# Patient Record
Sex: Female | Born: 1965 | Race: Black or African American | Hispanic: No | Marital: Single | Smoking: Former smoker
Health system: Southern US, Community
[De-identification: ages and names within clinical notes are randomized; demographics above are authoritative.]

## PROBLEM LIST (undated history)

## (undated) ENCOUNTER — Emergency Department (HOSPITAL_COMMUNITY): Admission: EM | Payer: Self-pay | Source: Home / Self Care

## (undated) DIAGNOSIS — I10 Essential (primary) hypertension: Secondary | ICD-10-CM

## (undated) DIAGNOSIS — Z4502 Encounter for adjustment and management of automatic implantable cardiac defibrillator: Secondary | ICD-10-CM

## (undated) DIAGNOSIS — K219 Gastro-esophageal reflux disease without esophagitis: Secondary | ICD-10-CM

## (undated) DIAGNOSIS — Z95810 Presence of automatic (implantable) cardiac defibrillator: Secondary | ICD-10-CM

## (undated) DIAGNOSIS — D869 Sarcoidosis, unspecified: Secondary | ICD-10-CM

## (undated) DIAGNOSIS — I509 Heart failure, unspecified: Secondary | ICD-10-CM

## (undated) DIAGNOSIS — E119 Type 2 diabetes mellitus without complications: Secondary | ICD-10-CM

## (undated) DIAGNOSIS — H269 Unspecified cataract: Secondary | ICD-10-CM

## (undated) DIAGNOSIS — F32A Depression, unspecified: Secondary | ICD-10-CM

## (undated) DIAGNOSIS — I428 Other cardiomyopathies: Secondary | ICD-10-CM

## (undated) DIAGNOSIS — F329 Major depressive disorder, single episode, unspecified: Secondary | ICD-10-CM

## (undated) HISTORY — DX: Unspecified cataract: H26.9

## (undated) HISTORY — PX: INSERTION OF ICD: SHX6689

## (undated) HISTORY — PX: PACEMAKER INSERTION: SHX728

## (undated) HISTORY — PX: HEART TRANSPLANT: SHX268

## (undated) HISTORY — PX: TUBAL LIGATION: SHX77

## (undated) HISTORY — PX: LAPAROSCOPIC GASTRIC SLEEVE RESECTION: SHX5895

## (undated) HISTORY — PX: CHOLECYSTECTOMY: SHX55

---
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
## 2015-01-13 DIAGNOSIS — Z95810 Presence of automatic (implantable) cardiac defibrillator: Secondary | ICD-10-CM

## 2015-01-13 HISTORY — DX: Presence of automatic (implantable) cardiac defibrillator: Z95.810

## 2015-08-13 ENCOUNTER — Emergency Department (HOSPITAL_BASED_OUTPATIENT_CLINIC_OR_DEPARTMENT_OTHER): Payer: Medicaid Other

## 2015-08-13 ENCOUNTER — Encounter (HOSPITAL_BASED_OUTPATIENT_CLINIC_OR_DEPARTMENT_OTHER): Payer: Self-pay

## 2015-08-13 ENCOUNTER — Emergency Department (HOSPITAL_BASED_OUTPATIENT_CLINIC_OR_DEPARTMENT_OTHER)
Admission: EM | Admit: 2015-08-13 | Discharge: 2015-08-13 | Disposition: A | Discharge status: Home / Self Care | Payer: Medicaid Other | Attending: Emergency Medicine | Admitting: Emergency Medicine

## 2015-08-13 DIAGNOSIS — Z9861 Coronary angioplasty status: Secondary | ICD-10-CM | POA: Insufficient documentation

## 2015-08-13 DIAGNOSIS — E119 Type 2 diabetes mellitus without complications: Secondary | ICD-10-CM | POA: Insufficient documentation

## 2015-08-13 DIAGNOSIS — S161XXA Strain of muscle, fascia and tendon at neck level, initial encounter: Secondary | ICD-10-CM | POA: Diagnosis not present

## 2015-08-13 DIAGNOSIS — I509 Heart failure, unspecified: Secondary | ICD-10-CM | POA: Diagnosis not present

## 2015-08-13 DIAGNOSIS — Y9389 Activity, other specified: Secondary | ICD-10-CM | POA: Diagnosis not present

## 2015-08-13 DIAGNOSIS — Z8659 Personal history of other mental and behavioral disorders: Secondary | ICD-10-CM | POA: Diagnosis not present

## 2015-08-13 DIAGNOSIS — Y92410 Unspecified street and highway as the place of occurrence of the external cause: Secondary | ICD-10-CM | POA: Insufficient documentation

## 2015-08-13 DIAGNOSIS — I10 Essential (primary) hypertension: Secondary | ICD-10-CM | POA: Diagnosis not present

## 2015-08-13 DIAGNOSIS — Z862 Personal history of diseases of the blood and blood-forming organs and certain disorders involving the immune mechanism: Secondary | ICD-10-CM | POA: Diagnosis not present

## 2015-08-13 DIAGNOSIS — S199XXA Unspecified injury of neck, initial encounter: Secondary | ICD-10-CM | POA: Diagnosis present

## 2015-08-13 DIAGNOSIS — S39012A Strain of muscle, fascia and tendon of lower back, initial encounter: Secondary | ICD-10-CM | POA: Insufficient documentation

## 2015-08-13 DIAGNOSIS — Y998 Other external cause status: Secondary | ICD-10-CM | POA: Diagnosis not present

## 2015-08-13 DIAGNOSIS — Z950 Presence of cardiac pacemaker: Secondary | ICD-10-CM | POA: Diagnosis not present

## 2015-08-13 HISTORY — DX: Type 2 diabetes mellitus without complications: E11.9

## 2015-08-13 HISTORY — DX: Sarcoidosis, unspecified: D86.9

## 2015-08-13 HISTORY — DX: Depression, unspecified: F32.A

## 2015-08-13 HISTORY — DX: Major depressive disorder, single episode, unspecified: F32.9

## 2015-08-13 HISTORY — DX: Essential (primary) hypertension: I10

## 2015-08-13 HISTORY — DX: Heart failure, unspecified: I50.9

## 2015-08-13 MED ORDER — NAPROXEN 500 MG PO TABS: 500.0000 mg | ORAL_TABLET | Freq: Two times a day (BID) | ORAL | Status: DC

## 2015-08-13 MED ORDER — HYDROCODONE-ACETAMINOPHEN 5-325 MG PO TABS: 1.0000 | ORAL_TABLET | Freq: Four times a day (QID) | ORAL | Status: DC | PRN
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
## 2015-08-13 NOTE — ED Notes (Signed)
MD at bedside. 
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
## 2015-09-02 DIAGNOSIS — E785 Hyperlipidemia, unspecified: Secondary | ICD-10-CM | POA: Insufficient documentation

## 2015-09-02 DIAGNOSIS — I471 Supraventricular tachycardia: Secondary | ICD-10-CM | POA: Insufficient documentation

## 2015-09-02 DIAGNOSIS — E039 Hypothyroidism, unspecified: Secondary | ICD-10-CM | POA: Insufficient documentation

## 2015-09-02 DIAGNOSIS — I5020 Unspecified systolic (congestive) heart failure: Secondary | ICD-10-CM | POA: Insufficient documentation

## 2016-02-08 ENCOUNTER — Encounter (HOSPITAL_BASED_OUTPATIENT_CLINIC_OR_DEPARTMENT_OTHER): Payer: Self-pay | Admitting: Emergency Medicine

## 2016-02-08 ENCOUNTER — Emergency Department (HOSPITAL_BASED_OUTPATIENT_CLINIC_OR_DEPARTMENT_OTHER): Payer: Medicaid Other

## 2016-02-08 ENCOUNTER — Observation Stay (HOSPITAL_BASED_OUTPATIENT_CLINIC_OR_DEPARTMENT_OTHER)
Admission: EM | Admit: 2016-02-08 | Discharge: 2016-02-09 | Disposition: A | Discharge status: Home / Self Care | Payer: Medicaid Other | Attending: Cardiology | Admitting: Cardiology

## 2016-02-08 ENCOUNTER — Encounter (HOSPITAL_COMMUNITY)
Admission: EM | Disposition: A | Discharge status: Home / Self Care | Payer: Self-pay | Source: Home / Self Care | Attending: Cardiology

## 2016-02-08 DIAGNOSIS — Z79899 Other long term (current) drug therapy: Secondary | ICD-10-CM | POA: Diagnosis not present

## 2016-02-08 DIAGNOSIS — I428 Other cardiomyopathies: Secondary | ICD-10-CM | POA: Diagnosis present

## 2016-02-08 DIAGNOSIS — Z7982 Long term (current) use of aspirin: Secondary | ICD-10-CM | POA: Diagnosis not present

## 2016-02-08 DIAGNOSIS — I5022 Chronic systolic (congestive) heart failure: Secondary | ICD-10-CM | POA: Diagnosis present

## 2016-02-08 DIAGNOSIS — R079 Chest pain, unspecified: Secondary | ICD-10-CM | POA: Diagnosis present

## 2016-02-08 DIAGNOSIS — F329 Major depressive disorder, single episode, unspecified: Secondary | ICD-10-CM | POA: Insufficient documentation

## 2016-02-08 DIAGNOSIS — I110 Hypertensive heart disease with heart failure: Secondary | ICD-10-CM | POA: Diagnosis not present

## 2016-02-08 DIAGNOSIS — I10 Essential (primary) hypertension: Secondary | ICD-10-CM | POA: Diagnosis not present

## 2016-02-08 DIAGNOSIS — I200 Unstable angina: Secondary | ICD-10-CM | POA: Diagnosis not present

## 2016-02-08 DIAGNOSIS — Z794 Long term (current) use of insulin: Secondary | ICD-10-CM | POA: Insufficient documentation

## 2016-02-08 DIAGNOSIS — I420 Dilated cardiomyopathy: Secondary | ICD-10-CM

## 2016-02-08 DIAGNOSIS — Z791 Long term (current) use of non-steroidal anti-inflammatories (NSAID): Secondary | ICD-10-CM | POA: Insufficient documentation

## 2016-02-08 DIAGNOSIS — Z95810 Presence of automatic (implantable) cardiac defibrillator: Secondary | ICD-10-CM | POA: Insufficient documentation

## 2016-02-08 DIAGNOSIS — Z87891 Personal history of nicotine dependence: Secondary | ICD-10-CM | POA: Insufficient documentation

## 2016-02-08 DIAGNOSIS — D869 Sarcoidosis, unspecified: Secondary | ICD-10-CM | POA: Diagnosis not present

## 2016-02-08 DIAGNOSIS — E119 Type 2 diabetes mellitus without complications: Secondary | ICD-10-CM | POA: Diagnosis not present

## 2016-02-08 DIAGNOSIS — R0789 Other chest pain: Principal | ICD-10-CM | POA: Insufficient documentation

## 2016-02-08 HISTORY — DX: Sarcoidosis, unspecified: D86.9

## 2016-02-08 HISTORY — DX: Other cardiomyopathies: I42.8

## 2016-02-08 HISTORY — PX: CARDIAC CATHETERIZATION: SHX172

## 2016-02-08 LAB — BRAIN NATRIURETIC PEPTIDE: B Natriuretic Peptide: 102.9 pg/mL — ABNORMAL HIGH (ref 0.0–100.0)

## 2016-02-08 LAB — GLUCOSE, CAPILLARY
Glucose-Capillary: 139 mg/dL — ABNORMAL HIGH (ref 65–99)
Glucose-Capillary: 120 mg/dL — ABNORMAL HIGH (ref 65–99)
Glucose-Capillary: 103 mg/dL — ABNORMAL HIGH (ref 65–99)
Glucose-Capillary: 215 mg/dL — ABNORMAL HIGH (ref 65–99)

## 2016-02-08 LAB — CBC WITH DIFFERENTIAL/PLATELET
Basophils Absolute: 0 10*3/uL (ref 0.0–0.1)
Basophils Relative: 0 %
Eosinophils Absolute: 0.1 10*3/uL (ref 0.0–0.7)
Eosinophils Relative: 1 %
HCT: 38.7 % (ref 36.0–46.0)
Hemoglobin: 12.7 g/dL (ref 12.0–15.0)
Lymphocytes Relative: 22 %
Lymphs Abs: 2.2 10*3/uL (ref 0.7–4.0)
MCH: 28 pg (ref 26.0–34.0)
MCHC: 32.8 g/dL (ref 30.0–36.0)
MCV: 85.4 fL (ref 78.0–100.0)
Monocytes Absolute: 1 10*3/uL (ref 0.1–1.0)
Monocytes Relative: 10 %
Neutro Abs: 6.8 10*3/uL (ref 1.7–7.7)
Neutrophils Relative %: 67 %
Platelets: 200 10*3/uL (ref 150–400)
RBC: 4.53 MIL/uL (ref 3.87–5.11)
RDW: 13.1 % (ref 11.5–15.5)
WBC: 10 10*3/uL (ref 4.0–10.5)

## 2016-02-08 LAB — BASIC METABOLIC PANEL
Anion gap: 7 (ref 5–15)
BUN: 9 mg/dL (ref 6–20)
CO2: 26 mmol/L (ref 22–32)
Calcium: 9 mg/dL (ref 8.9–10.3)
Chloride: 102 mmol/L (ref 101–111)
Creatinine, Ser: 0.64 mg/dL (ref 0.44–1.00)
GFR calc Af Amer: >60 mL/min (ref >60)
GFR calc non Af Amer: >60 mL/min (ref >60)
Glucose, Bld: 182 mg/dL — ABNORMAL HIGH (ref 65–99)
Potassium: 3.9 mmol/L (ref 3.5–5.1)
Sodium: 135 mmol/L (ref 135–145)

## 2016-02-08 LAB — TROPONIN I
Troponin I: <0.03 ng/mL (ref <0.031)
Troponin I: <0.03 ng/mL (ref <0.031)
Troponin I: <0.03 ng/mL (ref <0.031)
Troponin I: <0.03 ng/mL (ref <0.031)

## 2016-02-08 LAB — MRSA PCR SCREENING: MRSA by PCR: POSITIVE — AB

## 2016-02-08 LAB — PROTIME-INR
INR: 1.12 (ref 0.00–1.49)
Prothrombin Time: 14.6 seconds (ref 11.6–15.2)

## 2016-02-08 LAB — HEPARIN LEVEL (UNFRACTIONATED): Heparin Unfractionated: 0.6 IU/mL (ref 0.30–0.70)

## 2016-02-08 SURGERY — LEFT HEART CATH AND CORONARY ANGIOGRAPHY

## 2016-02-08 MED ORDER — SODIUM CHLORIDE 0.9 % IV SOLN: 250.0000 mL | INTRAVENOUS | Status: DC | PRN

## 2016-02-08 MED ORDER — ASPIRIN EC 81 MG PO TBEC: 81.0000 mg | DELAYED_RELEASE_TABLET | Freq: Every day | ORAL | Status: DC

## 2016-02-08 MED ORDER — TRAZODONE HCL 50 MG PO TABS
150.0000 mg | ORAL_TABLET | Freq: Every day | ORAL | Status: DC
  Administered 2016-02-08: 150 mg via ORAL
  Filled 2016-02-08: qty 1

## 2016-02-08 MED ORDER — NITROGLYCERIN 1 MG/10 ML FOR IR/CATH LAB
INTRA_ARTERIAL | Status: AC
  Filled 2016-02-08: qty 10

## 2016-02-08 MED ORDER — IOPAMIDOL (ISOVUE-370) INJECTION 76%
INTRAVENOUS | Status: DC | PRN
  Administered 2016-02-08: 40 mL via INTRA_ARTERIAL

## 2016-02-08 MED ORDER — CYCLOBENZAPRINE HCL 10 MG PO TABS: 10.0000 mg | ORAL_TABLET | Freq: Two times a day (BID) | ORAL | Status: DC | PRN

## 2016-02-08 MED ORDER — HEPARIN BOLUS VIA INFUSION
4000.0000 [IU] | Freq: Once | INTRAVENOUS | Status: AC
  Administered 2016-02-08: 4000 [IU] via INTRAVENOUS

## 2016-02-08 MED ORDER — ACETAMINOPHEN 325 MG PO TABS: 650.0000 mg | ORAL_TABLET | ORAL | Status: DC | PRN

## 2016-02-08 MED ORDER — HEPARIN SODIUM (PORCINE) 1000 UNIT/ML IJ SOLN
INTRAMUSCULAR | Status: AC
  Filled 2016-02-08: qty 1

## 2016-02-08 MED ORDER — SPIRONOLACTONE 25 MG PO TABS
25.0000 mg | ORAL_TABLET | Freq: Every day | ORAL | Status: DC
  Administered 2016-02-09: 25 mg via ORAL
  Filled 2016-02-08 (×2): qty 1

## 2016-02-08 MED ORDER — ONDANSETRON HCL 4 MG/2ML IJ SOLN: 4.0000 mg | Freq: Four times a day (QID) | INTRAMUSCULAR | Status: DC | PRN

## 2016-02-08 MED ORDER — IOPAMIDOL (ISOVUE-370) INJECTION 76%
INTRAVENOUS | Status: AC
  Filled 2016-02-08: qty 100

## 2016-02-08 MED ORDER — INSULIN GLARGINE 100 UNIT/ML ~~LOC~~ SOLN
28.0000 [IU] | Freq: Every day | SUBCUTANEOUS | Status: DC
  Administered 2016-02-08: 28 [IU] via SUBCUTANEOUS
  Filled 2016-02-08 (×2): qty 0.28

## 2016-02-08 MED ORDER — NITROGLYCERIN IN D5W 200-5 MCG/ML-% IV SOLN
5.0000 ug/min | INTRAVENOUS | Status: DC
  Administered 2016-02-08: 5 ug/min via INTRAVENOUS
  Filled 2016-02-08: qty 250

## 2016-02-08 MED ORDER — SODIUM CHLORIDE 0.9% FLUSH
3.0000 mL | Freq: Two times a day (BID) | INTRAVENOUS | Status: DC
  Administered 2016-02-08 – 2016-02-09 (×2): 3 mL via INTRAVENOUS

## 2016-02-08 MED ORDER — HEPARIN (PORCINE) IN NACL 2-0.9 UNIT/ML-% IJ SOLN
INTRAMUSCULAR | Status: AC
  Filled 2016-02-08: qty 500

## 2016-02-08 MED ORDER — ACETAMINOPHEN 325 MG PO TABS
650.0000 mg | ORAL_TABLET | ORAL | Status: DC | PRN
  Administered 2016-02-08: 650 mg via ORAL
  Filled 2016-02-08: qty 2

## 2016-02-08 MED ORDER — CARVEDILOL 6.25 MG PO TABS
6.2500 mg | ORAL_TABLET | Freq: Two times a day (BID) | ORAL | Status: DC
  Administered 2016-02-08 – 2016-02-09 (×3): 6.25 mg via ORAL
  Filled 2016-02-08 (×3): qty 1

## 2016-02-08 MED ORDER — INSULIN ASPART 100 UNIT/ML ~~LOC~~ SOLN
0.0000 [IU] | Freq: Three times a day (TID) | SUBCUTANEOUS | Status: DC
  Administered 2016-02-09: 3 [IU] via SUBCUTANEOUS

## 2016-02-08 MED ORDER — FENTANYL CITRATE (PF) 100 MCG/2ML IJ SOLN
INTRAMUSCULAR | Status: AC
  Filled 2016-02-08: qty 2

## 2016-02-08 MED ORDER — MIDAZOLAM HCL 2 MG/2ML IJ SOLN
INTRAMUSCULAR | Status: AC
  Filled 2016-02-08: qty 2

## 2016-02-08 MED ORDER — SODIUM CHLORIDE 0.9% FLUSH
3.0000 mL | Freq: Two times a day (BID) | INTRAVENOUS | Status: DC
  Administered 2016-02-09: 3 mL via INTRAVENOUS

## 2016-02-08 MED ORDER — FENTANYL CITRATE (PF) 100 MCG/2ML IJ SOLN
INTRAMUSCULAR | Status: DC | PRN
  Administered 2016-02-08 (×2): 25 ug via INTRAVENOUS

## 2016-02-08 MED ORDER — ASPIRIN 81 MG PO CHEW: 81.0000 mg | CHEWABLE_TABLET | ORAL | Status: DC

## 2016-02-08 MED ORDER — ASPIRIN 81 MG PO CHEW
324.0000 mg | CHEWABLE_TABLET | Freq: Once | ORAL | Status: AC
  Administered 2016-02-08: 324 mg via ORAL
  Filled 2016-02-08: qty 4

## 2016-02-08 MED ORDER — MUPIROCIN 2 % EX OINT
1.0000 "application " | TOPICAL_OINTMENT | Freq: Two times a day (BID) | CUTANEOUS | Status: DC
  Administered 2016-02-08 – 2016-02-09 (×3): 1 via NASAL
  Filled 2016-02-08 (×2): qty 22

## 2016-02-08 MED ORDER — ACETAMINOPHEN 325 MG PO TABS
650.0000 mg | ORAL_TABLET | Freq: Once | ORAL | Status: AC
  Administered 2016-02-08: 650 mg via ORAL

## 2016-02-08 MED ORDER — FLUTICASONE PROPIONATE 50 MCG/ACT NA SUSP
1.0000 | Freq: Every day | NASAL | Status: DC
  Filled 2016-02-08 (×2): qty 16

## 2016-02-08 MED ORDER — FAMOTIDINE 20 MG PO TABS
20.0000 mg | ORAL_TABLET | Freq: Every day | ORAL | Status: DC
  Administered 2016-02-08 – 2016-02-09 (×2): 20 mg via ORAL
  Filled 2016-02-08 (×2): qty 1

## 2016-02-08 MED ORDER — SODIUM CHLORIDE 0.9% FLUSH: 3.0000 mL | INTRAVENOUS | Status: DC | PRN

## 2016-02-08 MED ORDER — MIDAZOLAM HCL 2 MG/2ML IJ SOLN
INTRAMUSCULAR | Status: DC | PRN
  Administered 2016-02-08: 2 mg via INTRAVENOUS

## 2016-02-08 MED ORDER — FLUOXETINE HCL 20 MG PO CAPS
40.0000 mg | ORAL_CAPSULE | Freq: Every day | ORAL | Status: DC
  Administered 2016-02-08 – 2016-02-09 (×2): 40 mg via ORAL
  Filled 2016-02-08 (×2): qty 2

## 2016-02-08 MED ORDER — CHLORHEXIDINE GLUCONATE CLOTH 2 % EX PADS
6.0000 | MEDICATED_PAD | Freq: Every day | CUTANEOUS | Status: DC
  Administered 2016-02-09: 6 via TOPICAL

## 2016-02-08 MED ORDER — ACETAMINOPHEN 325 MG PO TABS
ORAL_TABLET | ORAL | Status: AC
  Administered 2016-02-08: 650 mg via ORAL
  Filled 2016-02-08: qty 2

## 2016-02-08 MED ORDER — METOPROLOL TARTRATE 25 MG PO TABS
25.0000 mg | ORAL_TABLET | Freq: Every day | ORAL | Status: DC
Start: 2016-02-09 — End: 2016-02-08

## 2016-02-08 MED ORDER — METOPROLOL TARTRATE 25 MG PO TABS: 25.0000 mg | ORAL_TABLET | Freq: Every day | ORAL | Status: DC

## 2016-02-08 MED ORDER — METOPROLOL TARTRATE 12.5 MG HALF TABLET
12.5000 mg | ORAL_TABLET | Freq: Once | ORAL | Status: AC
  Administered 2016-02-08: 12.5 mg via ORAL
  Filled 2016-02-08: qty 1

## 2016-02-08 MED ORDER — SODIUM CHLORIDE 0.9 % WEIGHT BASED INFUSION
3.0000 mL/kg/h | INTRAVENOUS | Status: DC
  Administered 2016-02-08: 3 mL/kg/h via INTRAVENOUS

## 2016-02-08 MED ORDER — HEPARIN (PORCINE) IN NACL 2-0.9 UNIT/ML-% IJ SOLN
INTRAMUSCULAR | Status: DC | PRN
  Administered 2016-02-08: 10 mL via INTRA_ARTERIAL

## 2016-02-08 MED ORDER — VERAPAMIL HCL 2.5 MG/ML IV SOLN
INTRAVENOUS | Status: AC
  Filled 2016-02-08: qty 2

## 2016-02-08 MED ORDER — HEPARIN (PORCINE) IN NACL 100-0.45 UNIT/ML-% IJ SOLN
1250.0000 [IU]/h | INTRAMUSCULAR | Status: DC
  Administered 2016-02-08: 1250 [IU]/h via INTRAVENOUS
  Filled 2016-02-08: qty 250

## 2016-02-08 MED ORDER — NITROGLYCERIN 0.4 MG SL SUBL
0.4000 mg | SUBLINGUAL_TABLET | SUBLINGUAL | Status: DC | PRN
Start: 2016-02-08 — End: 2016-02-09
  Administered 2016-02-08: 0.4 mg via SUBLINGUAL
  Filled 2016-02-08: qty 1

## 2016-02-08 MED ORDER — HEPARIN (PORCINE) IN NACL 2-0.9 UNIT/ML-% IJ SOLN
INTRAMUSCULAR | Status: DC | PRN
  Administered 2016-02-08: 19:00:00

## 2016-02-08 MED ORDER — HEPARIN SODIUM (PORCINE) 1000 UNIT/ML IJ SOLN
INTRAMUSCULAR | Status: DC | PRN
  Administered 2016-02-08: 6000 [IU] via INTRAVENOUS

## 2016-02-08 MED ORDER — LIDOCAINE HCL (PF) 1 % IJ SOLN
INTRAMUSCULAR | Status: DC | PRN
  Administered 2016-02-08: 2 mL via INTRADERMAL

## 2016-02-08 MED ORDER — SODIUM CHLORIDE 0.9 % WEIGHT BASED INFUSION
1.0000 mL/kg/h | INTRAVENOUS | Status: DC
  Administered 2016-02-08: 1 mL/kg/h via INTRAVENOUS

## 2016-02-08 MED ORDER — SODIUM CHLORIDE 0.9% FLUSH: 3.0000 mL | Freq: Two times a day (BID) | INTRAVENOUS | Status: DC

## 2016-02-08 MED ORDER — METOPROLOL TARTRATE 12.5 MG HALF TABLET
12.5000 mg | ORAL_TABLET | Freq: Two times a day (BID) | ORAL | Status: DC
  Administered 2016-02-08: 12.5 mg via ORAL
  Filled 2016-02-08: qty 1

## 2016-02-08 MED ORDER — FUROSEMIDE 40 MG PO TABS
40.0000 mg | ORAL_TABLET | Freq: Every day | ORAL | Status: DC
  Administered 2016-02-09: 40 mg via ORAL
  Filled 2016-02-08 (×2): qty 1

## 2016-02-08 MED ORDER — LIDOCAINE HCL (PF) 1 % IJ SOLN
INTRAMUSCULAR | Status: AC
Start: 2016-02-08 — End: 2016-02-08
  Filled 2016-02-08: qty 30

## 2016-02-08 MED ORDER — HYDROXYCHLOROQUINE SULFATE 200 MG PO TABS
400.0000 mg | ORAL_TABLET | Freq: Every day | ORAL | Status: DC
  Administered 2016-02-08 – 2016-02-09 (×2): 400 mg via ORAL
  Filled 2016-02-08 (×2): qty 2

## 2016-02-08 SURGICAL SUPPLY — 9 items
CATH INFINITI 5 FR JL3.5 (CATHETERS) ×2 IMPLANT
CATH INFINITI JR4 5F (CATHETERS) ×2 IMPLANT
DEVICE RAD COMP TR BAND LRG (VASCULAR PRODUCTS) ×2 IMPLANT
GLIDESHEATH SLEND SS 6F .021 (SHEATH) ×2 IMPLANT
KIT HEART LEFT (KITS) ×2 IMPLANT
PACK CARDIAC CATHETERIZATION (CUSTOM PROCEDURE TRAY) ×2 IMPLANT
TRANSDUCER W/STOPCOCK (MISCELLANEOUS) ×2 IMPLANT
TUBING CIL FLEX 10 FLL-RA (TUBING) ×2 IMPLANT
WIRE SAFE-T 1.5MM-J .035X260CM (WIRE) ×2 IMPLANT

## 2016-02-08 NOTE — Progress Notes (Signed)
ANTICOAGULATION CONSULT NOTE - Initial Consult  Pharmacy Consult for Heparin Indication: chest pain/ACS  No Known Allergies  Patient Measurements: Height: 5\' 6"  (167.6 cm) Weight: 283 lb 3.2 oz (128.459 kg) IBW/kg (Calculated) : 59.3 Heparin Dosing Weight: 89 kg  Vital Signs: Temp: 97.9 F (36.6 C) (04/11 0207) Temp Source: Oral (04/11 0207) BP: 117/74 mmHg (04/11 0400) Pulse Rate: 87 (04/11 0400)  Labs:  Recent Labs  02/08/16 0345  HGB 12.7  HCT 38.7  PLT 200  CREATININE 0.64  TROPONINI <0.03    Estimated Creatinine Clearance: 116.8 mL/min (by C-G formula based on Cr of 0.64).   Medical History: Past Medical History  Diagnosis Date  . CHF (congestive heart failure) (HCC)   . Depression   . Sarcoidosis (HCC)   . Diabetes mellitus without complication (HCC)   . Hypertension     Medications:  See electronic med rec  Assessment: 50 y.o. F presents with L arm pain x 3 weeks. To begin heparin for r/o ACS. CBC ok on admission. No AC PTA.  Goal of Therapy:  Heparin level 0.3-0.7 units/ml Monitor platelets by anticoagulation protocol: Yes   Plan:  Heparin IV bolus 4000 units Heparin gtt at 1250 units/hr Will f/u heparin level in hours Daily heparin level and CBC  Christoper Fabian, PharmD, BCPS Clinical pharmacist, pager [phone] 02/08/2016,4:26 AM

## 2016-02-08 NOTE — ED Notes (Signed)
MD at bedside. Discussing plan for admission.

## 2016-02-08 NOTE — ED Notes (Signed)
Nitro received after patient ambulated to the bathroom. Pt came back stating "Its starting again" referring to the left side arm pain.

## 2016-02-08 NOTE — ED Notes (Signed)
Pt c/o persistent arm pain x3 weeks. Left arm has mild swelling. Pt states she was seen at [facility] last night where they did a full work up and did not find anything. States "I took 2 advil, maybe I have should have taken more but I wasn't sure." A/O full ROM.

## 2016-02-08 NOTE — H&P (Signed)
  History & Physical    Patient ID: Alicia Cook MRN: [id number], DOB/AGE: 01/29/1966   Admit date: 02/08/2016  Primary Physician: COUSINS, Latonya A, FNP Primary Cardiologist: [facility]   History of Present Illness    Alicia Cook is a 49 y.o. female with past medical history of nonischemic cardiomyopathy (reported EF of 25%, s/p ICD placement 2016), HTN, Type 2 DM, tobacco abuse, and Sarcoidosis who presented to [facility] on 02/08/2016 for evaluation of left arm pain radiating into her chest.  The patient is overall a poor historian and says she "takes a medication that harms her memory". She is aware of having a cardiac catheterization in the past but unsure when. While at [facility] they were able to obtain her records from [facility] which showed she had a catheterization in 2011 and showed angiographically normal coronary arteries. 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 EF by echocardiogram 35-40%. PAP = 24 mm Hg.   The patient reports she had an ICD placed in 12/2014 and she believes her EF was "20%" at that time and says "it was placed because of my sarcoidosis". She denies having a repeat cath in recent years, saying she only ever had one in her entire life.  She recently moved from [place] and has been establishing care in the [place] area.  She was seen by a Cardiologist at [facility] in 08/2015 and asymptomatic at that time. Over the past few months, she has developed a pressure going down her left arm and radiating into the left side of her chest. It is associated with dyspnea and nausea and occurs with exertional acticity. She has been evaluated over 3 times in the ED for the pain and was given Morphine and had resolution of her symptoms. She was subseuently discharged and told to follow-up with her Cardiologist but she was unable to be fit in for an appointment.   She did have a stress test performed on 02/01/2016 which showed no scintigraphic  evidence of prior infarct or inducible ischemia. Global hypokinesia with akinesia involving the inferior and lateral walls of the left ventricle were noted and an EF of 25% was reported at that time. Overall, the test was high-risk.  Over the past several days, the pain has increased in intensity and frequency, occurring 3- 4 times a day. Develops with walking around her home and relieved with rest. The pain typically lasts 20-30 minutes.  Since presenting to [facility], her labs showed a WBC of 10.0. Hgb 12.7. Platelets 200. Electrolytes WNL. Creatinine 0.64. Troponin negative. BNP 102. EKG shows NSR, HR 88, with nonspecific intraventricular conduction delay. She was started on Heparin along with a NTG drip and reports her pain has now resolved.   Past Medical History    Past Medical History  Diagnosis Date  . CHF (congestive heart failure) (HCC)   . Depression   . Sarcoidosis (HCC)   . Diabetes mellitus without complication (HCC)   . Hypertension   . Nonischemic cardiomyopathy (HCC)     a. EF 35-40% by cath in 2011 with normal cors b. s/p ICD implantation in 12/2014 c. EF 25% by NST in 02/01/2016    Past Surgical History  Procedure Laterality Date  . Cesarean section    . Pacemaker placement    . Abdominal surgery    . Cholecystectomy    . Tubal ligation       Allergies: No Known Allergies   Home Medications    Prior   to Admission medications   Medication Sig Start Date End Date Taking? Authorizing Provider  ibuprofen (ADVIL,MOTRIN) 100 MG tablet Take 200 mg by mouth every 6 (six) hours as needed for pain.   Yes Historical Provider, MD  HYDROcodone-acetaminophen (NORCO/VICODIN) 5-325 MG tablet Take 1-2 tablets by mouth every 6 (six) hours as needed for moderate pain. 08/13/15   Scott Zackowski, MD  naproxen (NAPROSYN) 500 MG tablet Take 1 tablet (500 mg total) by mouth 2 (two) times daily. 08/13/15   Scott Zackowski, MD    Family History    Family History  Problem Relation Age  of Onset  . Heart attack Father     a. Initial onset in his 50's. S/p CABG  . Heart failure Father     Social History    Social History   Social History  . Marital Status: Single    Spouse Name: N/A  . Number of Children: N/A  . Years of Education: N/A   Occupational History  . Not on file.   Social History Main Topics  . Smoking status: Former Smoker -- 1.00 packs/day for 20 years    Types: Cigarettes  . Smokeless tobacco: Not on file  . Alcohol Use: No  . Drug Use: No  . Sexual Activity: Not on file   Other Topics Concern  . Not on file   Social History Narrative     Review of Systems    General:  No chills, fever, night sweats or weight changes.  Cardiovascular:  No edema, orthopnea, palpitations, paroxysmal nocturnal dyspnea. Positive for chest pain and dyspnea on exertion. Dermatological: No rash, lesions/masses Respiratory: No cough, Positive for dyspnea. Urologic: No hematuria, dysuria Abdominal:   No vomiting, diarrhea, bright red blood per rectum, melena, or hematemesis. Positive for nausea. Neurologic:  No visual changes, wkns, changes in mental status. All other systems reviewed and are otherwise negative except as noted above.  Physical Exam    Blood pressure 139/89, pulse 81, temperature 98 F (36.7 C), temperature source Oral, resp. rate 18, height 5' 7" (1.702 m), weight 283 lb 1.6 oz (128.413 kg), last menstrual period 10/31/2015, SpO2 100 %.  General: Well developed, well nourished,female in no acute distress. Head: Normocephalic, atraumatic, sclera non-icteric, no xanthomas, nares are without discharge.  Neck: No carotid bruits. JVD not elevated.  Lungs: Respirations regular and unlabored, without wheezes or rales.  Heart: Regular rate and rhythm. No S3 or S4.  No murmur, no rubs, or gallops appreciated. Abdomen: Soft, non-tender, non-distended with normoactive bowel sounds. No hepatomegaly. No rebound/guarding. No obvious abdominal masses. Msk:   Strength and tone appear normal for age. No joint deformities or effusions. Extremities: No clubbing or cyanosis. No edema.  Distal pedal pulses are 2+ bilaterally. Neuro: Alert and oriented X 3. Moves all extremities spontaneously. No focal deficits noted. Psych:  Responds to questions appropriately with a normal affect. Skin: No rashes or lesions noted  Labs    Troponin (Point of Care Test) No results for input(s): TROPIPOC in the last 72 hours.  Recent Labs  02/08/16 0345  TROPONINI <0.03   Lab Results  Component Value Date   WBC 10.0 02/08/2016   HGB 12.7 02/08/2016   HCT 38.7 02/08/2016   MCV 85.4 02/08/2016   PLT 200 02/08/2016     Recent Labs Lab 02/08/16 0345  NA 135  K 3.9  CL 102  CO2 26  BUN 9  CREATININE 0.64  CALCIUM 9.0  GLUCOSE 182*   No results   found for: CHOL, HDL, LDLCALC, TRIG No results found for: DDIMER   B NATRIURETIC PEPTIDE  Date/Time Value Ref Range Status  02/08/2016 03:45 AM 102.9* 0.0 - 100.0 pg/mL Final    EKG & Cardiac Imaging    EKG: NSR, HR 88. Nonspecific intraventricular conduction delay.  Nuclear Stress Test: 02/01/2016 IMPRESSION: 1. No definite scintigraphic evidence of prior infarction pharmacologically induced ischemia.  2. Global hypokinesia with relative akinesia involving the inferior and lateral walls of the left ventricle..  3. Left ventricular ejection fraction 25%  4. High-risk stress test findings*.  *2012 Appropriate Use Criteria for Coronary Revascularization Focused Update: J Am Coll Cardiol. 2012;59(9):857-881. http://content.onlinejacc.org/article.aspx?articleid=1201161  Assessment & Plan    1. Chest pain - reports having pressure down her left arm which radiates into the left-side of her chest. Presents with exertion and associated symptoms include dyspnea and nausea. Has been progressing in severity for the past few months. - last cardiac cath was in 2011 and showed normal coronary arteries with  an EF of 35-40%.Pateint reports her EF was 20% in 2016 at time of ICD placement but a repeat cath was not performed. - NST on 02/01/2016 showed no scintigraphic evidence of prior infarct or inducible ischemia but global hypokinesia with akinesia involving the inferior and lateral walls of the left ventricle were noted and an EF of 25% was reported at that time.  - Initial troponin is negative. EKG shows NSR, HR 88, with nonspecific intraventricular conduction delay. Reports resolution of her pain after being started on IV NTG and Heparin. Lipid Panel pending.  - difficult situation as her symptoms seem consistent with unstable angina, yet she had a normal cath in 2011 and no ischemia noted on her recent stress test. Would recommend proceeding with a cardiac catheterization for definitive diagnosis, for her EF has dropped since 2011 and she now has symptoms concerning for unstable angina. The risks and benefits of the procedure were discussed with the patient and she agrees to proceed.  2. 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 EF noted to be 35-40% by cath in 2011, per patient's report was 20% at time of ICD implantation. - repeat echocardiogram pending. - continue PTA Lasix, Spironolactone, and BB. Was on Cardizem CD 240mg daily PTA--> will hold with new reduced EF of 25% as reported by NST last week.   3. HTN - currently well-controlled. - continue medication regimen.  4. Type 2 DM - Metformin held in regards to possible cath. - continue home Lantus and SSI.  5. Sarcoidosis - continue PTA Plaquenil.    Signed, Jeorgia Helming M Zyiah Withington, PA-C 02/08/2016, 9:29 AM Pager: [phone]   

## 2016-02-08 NOTE — ED Provider Notes (Addendum)
TIME SEEN: 3:00 AM  CHIEF COMPLAINT: Chest pain, left arm pain, shortness of breath  HPI: Pt is a 50 y.o. female with history of sarcoidosis, hypertension, diabetes, cardiomyopathy with an EF of 25% status post a fibular/pacemaker placement 2016 in [place] who presents to the emergency department with worsening episodes of left arm pain. States that she will have episodes of left arm pain that she describes as a throbbing discomfort and then pain will continue into her left anterior chest and upper left jaw. States that pain used to," once in a blue moon" over the past 3-4 weeks but is now more frequent and is more severe. She states the pain now last 30-45 minutes. She does not notice any aggravating or alleviating factors. She does have associated shortness of breath and sudden onset nausea with this but no dizziness or diaphoresis. She still having some pain currently but had has resolved. She states she tried ibuprofen at home without relief. No injury to the chest or arm. No numbness, tingling. Was seen at [facility] on April 4 and was admitted. At that time she had a stress test (see below) that was deemed high risk but showed no obvious reversible ischemic abnormality. She did have significant hypokinesia.  Patient returned to the emergency department at [facility] on April 10 and had 2 negative troponins. She had a clear chest x-ray and a CT angiography of her chest which showed no pulmonary embolus. There was minimal bibasilar atelectasis noted. She had a 1.2 cm right paratracheal node that was nonspecific.  States her last cardiac catheterization was in 2010 at [facility] in [place].  PCP is Dr. Cherly Hensen Cardiologist is with [facility]   NM Test 02/01/16:   IMPRESSION: 1. No definite scintigraphic evidence of prior infarction pharmacologically induced ischemia.  2. Global hypokinesia with relative akinesia involving the  inferior and lateral walls of the left ventricle..  3. Left ventricular ejection fraction 25%  4. High-risk stress test findings*.  *2012 Appropriate Use Criteria for Coronary Revascularization     ROS: See HPI Constitutional: no fever  Eyes: no drainage  ENT: no runny nose   Cardiovascular:   chest pain  Resp:  SOB  GI: no vomiting GU: no dysuria Integumentary: no rash  Allergy: no hives  Musculoskeletal: no leg swelling  Neurological: no slurred speech ROS otherwise negative  PAST MEDICAL HISTORY/PAST SURGICAL HISTORY:  Past Medical History  Diagnosis Date  . CHF (congestive heart failure) (HCC)   . Depression   . Sarcoidosis (HCC)   . Diabetes mellitus without complication (HCC)   . Hypertension     MEDICATIONS:  Prior to Admission medications   Medication Sig Start Date End Date Taking? Authorizing Provider  ibuprofen (ADVIL,MOTRIN) 100 MG tablet Take 200 mg by mouth every 6 (six) hours as needed for pain.   Yes Historical Provider, MD  HYDROcodone-acetaminophen (NORCO/VICODIN) 5-325 MG tablet Take 1-2 tablets by mouth every 6 (six) hours as needed for moderate pain. 08/13/15   Vanetta Mulders, MD  naproxen (NAPROSYN) 500 MG tablet Take 1 tablet (500 mg total) by mouth 2 (two) times daily. 08/13/15   Vanetta Mulders, MD    ALLERGIES:  No Known Allergies  SOCIAL HISTORY:  Social History  Substance Use Topics  . Smoking status: Never Smoker   . Smokeless tobacco: Not on file  . Alcohol Use: No    FAMILY HISTORY: No family history on file.  EXAM: BP  126/84 mmHg  Pulse 90  Temp(Src) 97.9 F (36.6 C) (Oral)  Resp 30  Ht  (1.676 m)  Wt 283 lb 3.2 oz (128.459 kg)  BMI 45.73 kg/m2  SpO2 100%  LMP 10/31/2015 CONSTITUTIONAL: Alert and oriented and responds appropriately to questions. Obese, appears uncomfortable HEAD: Normocephalic EYES: Conjunctivae clear, PERRL ENT: normal nose; no rhinorrhea; moist mucous membranes NECK: Supple, no  meningismus, no LAD  CARD: RRR; S1 and S2 appreciated; no murmurs, no clicks, no rubs, no gallops CHEST:  Nontender to palpation without crepitus, ecchymosis or deformity, pacemaker noted in chest wall without surrounding erythema, warmth, fluctuance or induration RESP: Normal chest excursion without splinting or tachypnea; breath sounds clear and equal bilaterally; no wheezes, no rhonchi, no rales, no hypoxia or respiratory distress, speaking full sentences ABD/GI: Normal bowel sounds; non-distended; soft, non-tender, no rebound, no guarding, no peritoneal signs BACK:  The back appears normal and is non-tender to palpation, there is no CVA tenderness EXT: Left arm has no bony tenderness or tenderness with palpation. There are no signs of swelling of the left arm compared to the right. Compartments are all soft. No joint effusion. 2+ radial pulses bilaterally. Normal ROM in all joints; non-tender to palpation; no edema; normal capillary refill; no cyanosis, no calf tenderness or swelling    SKIN: Normal color for age and race; warm; no rash NEURO: Moves all extremities equally, sensation to light touch intact diffusely, cranial nerves II through XII intact PSYCH: The patient's mood and manner are appropriate. Grooming and personal hygiene are appropriate.  MEDICAL DECISION MAKING: Patient here with what seems to be a concerning story to me for unstable angina. She has a very concerning past medical history high risk recent stress test. She is currently still having some pain. We'll give aspirin and nitroglycerin. Recently had a negative chest x-ray and a CT of her chest. At this time I do not feel that needs to be repeated given it was done 24 hours ago. We'll obtain cardiac labs. I do feel she will need to be seen by cardiologist today. I feel she will need admission. She would like to go back to [facility].  ED PROGRESS: 4:20 AM  Pt's pain is gone after aspirin. She has not received  nitroglycerin. Labs are unremarkable. Troponin is negative. Cardiac catheterization report from [facility] shows angiographically normal coronary arteries. 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 EF by echocardiogram 35-40%.  PAP = 24 mm Hg.  Was performed 06/27/10.  I have been informed that there are no beds available at [facility]. She agrees on transfer to [facility]. Will discuss with cardiology on call.  Given my concern for unstable angina, will start heparin.   5:00 AM  D/w Dr. Virgina Organ with cardiology. He agrees with admission. They will admit to cardiology service. Accepting is Dr. Wyline Mood. Admit to step down bed at [facility]. Patient has been updated with this plan. She is still pain-free.   5:05 AM  Pt got up to bathroom and walked and pain started back 10/10 in her chest.  Will repeat EKG and start NTG drip.    5:20 AM  Pt's repeat EKG shows no change compared to first.  6:20 AM  Pt has no chest pain or left arm pain prior to transfer. EMS at bedside. Hemodynamically stable. On heparin and nitroglycerin drip. Did receive Tylenol for mild headache after nitroglycerin drip started.  I reviewed all nursing  notes, vitals, pertinent old records, EKGs, labs, imaging (as available).  CRITICAL CARE Performed by: Raelyn Number   Total critical care time: 45 minutes  Critical care time was exclusive of separately billable procedures and treating other patients.  Critical care was necessary to treat or prevent imminent or life-threatening deterioration.  Critical care was time spent personally by me on the following activities: development of treatment plan with patient and/or surrogate as well as nursing, discussions with consultants, evaluation of patient's response to treatment, examination of patient, obtaining history from patient or surrogate, ordering and performing treatments and interventions, ordering and review of  laboratory studies, ordering and review of radiographic studies, pulse oximetry and re-evaluation of patient's condition.    Date: 02/08/2016 2:28 AM  Rate: 92  Rhythm: normal sinus rhythm  QRS Axis: normal  Intervals:  IVCD  ST/T Wave abnormalities: normal  Conduction Disutrbances: none  Narrative Interpretation: IVCD, Q waves in anterior leads, no old tracing to compared           Date: 02/08/2016 5:10 AM  Rate: 88  Rhythm: normal sinus rhythm  QRS Axis: normal  Intervals: IVCD  ST/T Wave abnormalities: normal  Conduction Disutrbances: none  Narrative Interpretation:  IVCD, no change compared to prior earlier today               [employer], DO 02/08/16 0458  Layla Maw Shaneika Rossa, DO 02/08/16 0522  Layla Maw Bitha Fauteux, DO 02/08/16 1610

## 2016-02-08 NOTE — Progress Notes (Signed)
ANTICOAGULATION CONSULT NOTE - Follow Up Consult  Pharmacy Consult for Heparin Indication: chest pain/ACS  No Known Allergies  Patient Measurements: Height: 5\' 7"  (170.2 cm) Weight: 283 lb 1.6 oz (128.413 kg) IBW/kg (Calculated) : 61.6 Heparin Dosing Weight: 89 kg  Vital Signs: Temp: 97.9 F (36.6 C) (04/11 1133) Temp Source: Oral (04/11 1133) BP: 133/91 mmHg (04/11 1148) Pulse Rate: 74 (04/11 1148)  Labs:  Recent Labs  02/08/16 0345 02/08/16 0841 02/08/16 1421  HGB 12.7  --   --   HCT 38.7  --   --   PLT 200  --   --   LABPROT  --   --  14.6  INR  --   --  1.12  HEPARINUNFRC  --   --  0.60  CREATININE 0.64  --   --   TROPONINI <0.03 <0.03 <0.03    Estimated Creatinine Clearance: 118.6 mL/min (by C-G formula based on Cr of 0.64).   Medications:  Scheduled:  . [START ON 02/09/2016] aspirin  81 mg Oral Pre-Cath  . carvedilol  6.25 mg Oral BID WC  . Chlorhexidine Gluconate Cloth  6 each Topical Q0600  . famotidine  20 mg Oral Daily  . FLUoxetine  40 mg Oral Daily  . fluticasone  1 spray Each Nare Daily  . furosemide  40 mg Oral Daily  . hydroxychloroquine  400 mg Oral Daily  . insulin aspart  0-15 Units Subcutaneous TID WC  . insulin glargine  28 Units Subcutaneous QHS  . mupirocin ointment  1 application Nasal BID  . sodium chloride flush  3 mL Intravenous Q12H  . spironolactone  25 mg Oral Daily  . traZODone  150 mg Oral QHS   Infusions:  . sodium chloride 1 mL/kg/hr (02/08/16 1045)  . heparin 1,250 Units/hr (02/08/16 0449)  . nitroGLYCERIN 5 mcg/min (02/08/16 0510)    Assessment: 50 yo F on therapeutic heparin for CP.  Plans for cardiac cath this afternoon.  Goal of Therapy:  Heparin level 0.3-0.7 units/ml Monitor platelets by anticoagulation protocol: Yes   Plan:  Continue heparin at 1250 units/hr Heparin level and CBC daily Follow-up after cath.  [employer], Pharm.D., BCPS Clinical Pharmacist Pager [phone] 02/08/2016 3:34  PM

## 2016-02-08 NOTE — ED Notes (Signed)
EMS at bedside

## 2016-02-08 NOTE — ED Notes (Signed)
MD at bedside. 

## 2016-02-08 NOTE — Interval H&P Note (Signed)
Cath Lab Visit (complete for each Cath Lab visit)  Clinical Evaluation Leading to the Procedure:   ACS: Yes.    Non-ACS:    Anginal Classification: CCS IV  Anti-ischemic medical therapy: Minimal Therapy (1 class of medications)  Non-Invasive Test Results: Low-risk stress test findings: cardiac mortality <1%/year  Prior CABG: No previous CABG      History and Physical Interval Note:  02/08/2016 6:38 PM  Alicia Cook  has presented today for surgery, with the diagnosis of cp  The various methods of treatment have been discussed with the patient and family. After consideration of risks, benefits and other options for treatment, the patient has consented to  Procedure(s): Left Heart Cath and Coronary Angiography (N/A) as a surgical intervention .  The patient's history has been reviewed, patient examined, no change in status, stable for surgery.  I have reviewed the patient's chart and labs.  Questions were answered to the patient's satisfaction.     Shamaine Mulkern S.

## 2016-02-08 NOTE — Consult Note (Deleted)
History & Physical    Patient ID: Alicia Cook MRN: [id number], DOB/AGE: 50-Aug-1967   Admit date: 02/08/2016  Primary Physician: COUSINS, Daysha A, FNP Primary Cardiologist: [facility]   History of Present Illness    Alicia Cook is a 50 y.o. female with past medical history of nonischemic cardiomyopathy (reported EF of 25%, s/p ICD placement 2016), HTN, Type 2 DM, tobacco abuse, and Sarcoidosis who presented to [facility] on 02/08/2016 for evaluation of left arm pain radiating into her chest.  The patient is overall a poor historian and says she "takes a medication that harms her memory". She is aware of having a cardiac catheterization in the past but unsure when. While at [facility] they were able to obtain her records from [facility] which showed she had a catheterization in 2011 and showed angiographically normal coronary arteries. 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 EF by echocardiogram 35-40%. PAP = 24 mm Hg.   The patient reports she had an ICD placed in 12/2014 and she believes her EF was "20%" at that time and says "it was placed because of my sarcoidosis". She denies having a repeat cath in recent years, saying she only ever had one in her entire life.  She recently moved from [place] and has been establishing care in the [facility] area.  She was seen by a Cardiologist at [facility] in 08/2015 and asymptomatic at that time. Over the past few months, she has developed a pressure going down her left arm and radiating into the left side of her chest. It is associated with dyspnea and nausea and occurs with exertional acticity. She has been evaluated over 3 times in the ED for the pain and was given Morphine and had resolution of her symptoms. She was subseuently discharged and told to follow-up with her Cardiologist but she was unable to be fit in for an appointment.   She did have a stress test performed on 02/01/2016 which showed no scintigraphic  evidence of prior infarct or inducible ischemia. Global hypokinesia with akinesia involving the inferior and lateral walls of the left ventricle were noted and an EF of 25% was reported at that time. Overall, the test was high-risk.  Over the past several days, the pain has increased in intensity and frequency, occurring 3- 4 times a day. Develops with walking around her home and relieved with rest. The pain typically lasts 20-30 minutes.  Since presenting to [facility], her labs showed a WBC of 10.0. Hgb 12.7. Platelets 200. Electrolytes WNL. Creatinine 0.64. Troponin negative. BNP 102. EKG shows NSR, HR 88, with nonspecific intraventricular conduction delay. She was started on Heparin along with a NTG drip and reports her pain has now resolved.   Past Medical History    Past Medical History  Diagnosis Date  . CHF (congestive heart failure) (HCC)   . Depression   . Sarcoidosis (HCC)   . Diabetes mellitus without complication (HCC)   . Hypertension   . Nonischemic cardiomyopathy (HCC)     a. EF 35-40% by cath in 2011 with normal cors b. s/p ICD implantation in 12/2014 c. EF 25% by NST in 02/01/2016    Past Surgical History  Procedure Laterality Date  . Cesarean section    . Pacemaker placement    . Abdominal surgery    . Cholecystectomy    . Tubal ligation       Allergies: No Known Allergies   Home Medications    Prior  to Admission medications   Medication Sig Start Date End Date Taking? Authorizing Provider  ibuprofen (ADVIL,MOTRIN) 100 MG tablet Take 200 mg by mouth every 6 (six) hours as needed for pain.   Yes Historical Provider, MD  HYDROcodone-acetaminophen (NORCO/VICODIN) 5-325 MG tablet Take 1-2 tablets by mouth every 6 (six) hours as needed for moderate pain. 08/13/15   Vanetta Mulders, MD  naproxen (NAPROSYN) 500 MG tablet Take 1 tablet (500 mg total) by mouth 2 (two) times daily. 08/13/15   Vanetta Mulders, MD    Family History    Family History  Problem Relation Age  of Onset  . Heart attack Father     a. Initial onset in his 20's. S/p CABG  . Heart failure Father     Social History    Social History   Social History  . Marital Status: Single    Spouse Name: N/A  . Number of Children: N/A  . Years of Education: N/A   Occupational History  . Not on file.   Social History Main Topics  . Smoking status: Former Smoker -- 1.00 packs/day for 20 years    Types: Cigarettes  . Smokeless tobacco: Not on file  . Alcohol Use: No  . Drug Use: No  . Sexual Activity: Not on file   Other Topics Concern  . Not on file   Social History Narrative     Review of Systems    General:  No chills, fever, night sweats or weight changes.  Cardiovascular:  No edema, orthopnea, palpitations, paroxysmal nocturnal dyspnea. Positive for chest pain and dyspnea on exertion. Dermatological: No rash, lesions/masses Respiratory: No cough, Positive for dyspnea. Urologic: No hematuria, dysuria Abdominal:   No vomiting, diarrhea, bright red blood per rectum, melena, or hematemesis. Positive for nausea. Neurologic:  No visual changes, wkns, changes in mental status. All other systems reviewed and are otherwise negative except as noted above.  Physical Exam    Blood pressure 139/89, pulse 81, temperature 98 F (36.7 C), temperature source Oral, resp. rate 18, height  (1.702 m), weight 283 lb 1.6 oz (128.413 kg), last menstrual period 10/31/2015, SpO2 100 %.  General: Well developed, well nourished,female in no acute distress. Head: Normocephalic, atraumatic, sclera non-icteric, no xanthomas, nares are without discharge.  Neck: No carotid bruits. JVD not elevated.  Lungs: Respirations regular and unlabored, without wheezes or rales.  Heart: Regular rate and rhythm. No S3 or S4.  No murmur, no rubs, or gallops appreciated. Abdomen: Soft, non-tender, non-distended with normoactive bowel sounds. No hepatomegaly. No rebound/guarding. No obvious abdominal masses. Msk:   Strength and tone appear normal for age. No joint deformities or effusions. Extremities: No clubbing or cyanosis. No edema.  Distal pedal pulses are 2+ bilaterally. Neuro: Alert and oriented X 3. Moves all extremities spontaneously. No focal deficits noted. Psych:  Responds to questions appropriately with a normal affect. Skin: No rashes or lesions noted  Labs    Troponin (Point of Care Test) No results for input(s): TROPIPOC in the last 72 hours.  Recent Labs  02/08/16 0345  TROPONINI <0.03   Lab Results  Component Value Date   WBC 10.0 02/08/2016   HGB 12.7 02/08/2016   HCT 38.7 02/08/2016   MCV 85.4 02/08/2016   PLT 200 02/08/2016     Recent Labs Lab 02/08/16 0345  NA 135  K 3.9  CL 102  CO2 26  BUN 9  CREATININE 0.64  CALCIUM 9.0  GLUCOSE 182*   No results  found for: CHOL, HDL, LDLCALC, TRIG No results found for: DDIMER   B NATRIURETIC PEPTIDE  Date/Time Value Ref Range Status  02/08/2016 03:45 AM 102.9* 0.0 - 100.0 pg/mL Final    EKG & Cardiac Imaging    EKG: NSR, HR 88. Nonspecific intraventricular conduction delay.  Nuclear Stress Test: 02/01/2016 IMPRESSION: 1. No definite scintigraphic evidence of prior infarction pharmacologically induced ischemia.  2. Global hypokinesia with relative akinesia involving the inferior and lateral walls of the left ventricle..  3. Left ventricular ejection fraction 25%  4. High-risk stress test findings*.  *2012 Appropriate Use Criteria for Coronary Revascularization Focused Update: J Am Coll Cardiol. 2012;59(9):857-881. http://content.dementiazones.com.aspx?articleid=1201161  Assessment & Plan    1. Chest pain - reports having pressure down her left arm which radiates into the left-side of her chest. Presents with exertion and associated symptoms include dyspnea and nausea. Has been progressing in severity for the past few months. - last cardiac cath was in 2011 and showed normal coronary arteries with  an EF of 35-40%.Pateint reports her EF was 20% in 2016 at time of ICD placement but a repeat cath was not performed. - NST on 02/01/2016 showed no scintigraphic evidence of prior infarct or inducible ischemia but global hypokinesia with akinesia involving the inferior and lateral walls of the left ventricle were noted and an EF of 25% was reported at that time.  - Initial troponin is negative. EKG shows NSR, HR 88, with nonspecific intraventricular conduction delay. Reports resolution of her pain after being started on IV NTG and Heparin. Lipid Panel pending.  - difficult situation as her symptoms seem consistent with unstable angina, yet she had a normal cath in 2011 and no ischemia noted on her recent stress test. Would recommend proceeding with a cardiac catheterization for definitive diagnosis, for her EF has dropped since 2011 and she now has symptoms concerning for unstable angina. The risks and benefits of the procedure were discussed with the patient and she agrees to proceed.  2. 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 EF noted to be 35-40% by cath in 2011, per patient's report was 20% at time of ICD implantation. - repeat echocardiogram pending. - continue PTA Lasix, Spironolactone, and BB. Was on Cardizem CD 240mg  daily PTA--> will hold with new reduced EF of 25% as reported by NST last week.   3. HTN - currently well-controlled. - continue medication regimen.  4. Type 2 DM - Metformin held in regards to possible cath. - continue home Lantus and SSI.  5. Sarcoidosis - continue PTA Plaquenil.    Signed, Ellsworth Lennox, PA-C 02/08/2016, 9:29 AM Pager: [phone]

## 2016-02-09 ENCOUNTER — Encounter (HOSPITAL_COMMUNITY): Payer: Self-pay | Admitting: Interventional Cardiology

## 2016-02-09 ENCOUNTER — Observation Stay (HOSPITAL_BASED_OUTPATIENT_CLINIC_OR_DEPARTMENT_OTHER): Payer: Medicaid Other

## 2016-02-09 DIAGNOSIS — I110 Hypertensive heart disease with heart failure: Secondary | ICD-10-CM | POA: Diagnosis not present

## 2016-02-09 DIAGNOSIS — R079 Chest pain, unspecified: Secondary | ICD-10-CM

## 2016-02-09 DIAGNOSIS — I420 Dilated cardiomyopathy: Secondary | ICD-10-CM | POA: Diagnosis not present

## 2016-02-09 DIAGNOSIS — I10 Essential (primary) hypertension: Secondary | ICD-10-CM | POA: Diagnosis not present

## 2016-02-09 DIAGNOSIS — I5022 Chronic systolic (congestive) heart failure: Secondary | ICD-10-CM | POA: Diagnosis not present

## 2016-02-09 DIAGNOSIS — R072 Precordial pain: Secondary | ICD-10-CM

## 2016-02-09 DIAGNOSIS — R0789 Other chest pain: Secondary | ICD-10-CM | POA: Diagnosis not present

## 2016-02-09 DIAGNOSIS — E119 Type 2 diabetes mellitus without complications: Secondary | ICD-10-CM | POA: Diagnosis not present

## 2016-02-09 LAB — CBC
HCT: 37.2 % (ref 36.0–46.0)
Hemoglobin: 12.2 g/dL (ref 12.0–15.0)
MCH: 28 pg (ref 26.0–34.0)
MCHC: 32.8 g/dL (ref 30.0–36.0)
MCV: 85.5 fL (ref 78.0–100.0)
Platelets: 193 10*3/uL (ref 150–400)
RBC: 4.35 MIL/uL (ref 3.87–5.11)
RDW: 13.2 % (ref 11.5–15.5)
WBC: 9 10*3/uL (ref 4.0–10.5)

## 2016-02-09 LAB — ECHOCARDIOGRAM COMPLETE
Height: 67 in
Weight: 4547.2 oz

## 2016-02-09 LAB — BASIC METABOLIC PANEL
Anion gap: 9 (ref 5–15)
BUN: 8 mg/dL (ref 6–20)
CO2: 23 mmol/L (ref 22–32)
Calcium: 8.8 mg/dL — ABNORMAL LOW (ref 8.9–10.3)
Chloride: 107 mmol/L (ref 101–111)
Creatinine, Ser: 0.6 mg/dL (ref 0.44–1.00)
GFR calc Af Amer: >60 mL/min (ref >60)
GFR calc non Af Amer: >60 mL/min (ref >60)
Glucose, Bld: 110 mg/dL — ABNORMAL HIGH (ref 65–99)
Potassium: 4 mmol/L (ref 3.5–5.1)
Sodium: 139 mmol/L (ref 135–145)

## 2016-02-09 LAB — GLUCOSE, CAPILLARY
Glucose-Capillary: 110 mg/dL — ABNORMAL HIGH (ref 65–99)
Glucose-Capillary: 159 mg/dL — ABNORMAL HIGH (ref 65–99)
Glucose-Capillary: 105 mg/dL — ABNORMAL HIGH (ref 65–99)

## 2016-02-09 LAB — LIPID PANEL
Cholesterol: 165 mg/dL (ref 0–200)
HDL: 64 mg/dL (ref >40)
LDL Cholesterol: 82 mg/dL (ref 0–99)
Total CHOL/HDL Ratio: 2.6 RATIO
Triglycerides: 94 mg/dL (ref <150)
VLDL: 19 mg/dL (ref 0–40)

## 2016-02-09 MED ORDER — COLCHICINE 0.6 MG PO TABS
0.6000 mg | ORAL_TABLET | Freq: Every day | ORAL | Status: DC
  Administered 2016-02-09: 0.6 mg via ORAL
  Filled 2016-02-09: qty 1

## 2016-02-09 MED ORDER — IBUPROFEN 600 MG PO TABS
600.0000 mg | ORAL_TABLET | Freq: Three times a day (TID) | ORAL | Status: DC
  Administered 2016-02-09: 600 mg via ORAL
  Filled 2016-02-09: qty 1

## 2016-02-09 MED ORDER — LISINOPRIL 10 MG PO TABS
10.0000 mg | ORAL_TABLET | Freq: Every day | ORAL | Status: DC
  Administered 2016-02-09: 10 mg via ORAL
  Filled 2016-02-09: qty 1

## 2016-02-09 MED ORDER — IBUPROFEN 600 MG PO TABS: ORAL_TABLET | ORAL | Status: DC

## 2016-02-09 MED ORDER — LISINOPRIL 10 MG PO TABS: 10.0000 mg | ORAL_TABLET | Freq: Every day | ORAL | Status: DC

## 2016-02-09 MED ORDER — CARVEDILOL 6.25 MG PO TABS: 6.2500 mg | ORAL_TABLET | Freq: Two times a day (BID) | ORAL | Status: DC

## 2016-02-09 MED ORDER — PERFLUTREN LIPID MICROSPHERE
INTRAVENOUS | Status: AC
  Administered 2016-02-09: 3 mL
  Filled 2016-02-09: qty 10

## 2016-02-09 MED ORDER — COLCHICINE 0.6 MG PO TABS: 0.6000 mg | ORAL_TABLET | Freq: Every day | ORAL | Status: DC

## 2016-02-09 MED FILL — Verapamil HCl IV Soln 2.5 MG/ML: INTRAVENOUS | Qty: 2 | Status: AC

## 2016-02-09 MED FILL — Nitroglycerin IV Soln 100 MCG/ML in D5W: INTRA_ARTERIAL | Qty: 10 | Status: AC

## 2016-02-09 NOTE — Progress Notes (Signed)
TR band removed and right radial level 0. 2+pulses to right radial with good o2 sats. [facility] [employer]

## 2016-02-09 NOTE — Progress Notes (Addendum)
  Echocardiogram 2D Echocardiogram with Definity has been performed.  Cathie Beams 02/09/2016, 1:41 PM

## 2016-02-09 NOTE — Progress Notes (Signed)
Discharge teaching and instructions reviewed. Pt has no further questions at this time. VSS. Pt discharging via security back to [facility] where vehicle is parked.

## 2016-02-09 NOTE — Discharge Instructions (Addendum)
Pericarditis Pericarditis is swelling (inflammation) of the pericardium. The pericardium is a thin, double-layered, fluid-filled tissue sac that surrounds the heart. The purpose of the pericardium is to contain the heart in the chest cavity and keep the heart from overexpanding. Different types of pericarditis can occur, such as:  Acute pericarditis. Inflammation can develop suddenly in acute pericarditis.  Chronic pericarditis. Inflammation develops gradually and is long-lasting in chronic pericarditis.  Constrictive pericarditis. In this type of pericarditis, the layers of the pericardium stiffen and develop scar tissue. The scar tissue thickens and sticks together. This makes it difficult for the heart to pump and work as it normally does. CAUSES  Pericarditis can be caused from different conditions, such as:  A bacterial, fungal or viral infection.  After a heart attack (myocardial infarction).  After open-heart surgery (coronary bypass graft surgery).  Auto-immune conditions such as lupus, rheumatoid arthritis or scleroderma.  Kidney failure.  Low thyroid condition (hypothyroidism).  Cancer from another part of the body that has spread (metastasized) to the pericardium.  Chest injury or trauma.  After radiation treatment.  Certain medicines. SYMPTOMS  Symptoms of pericarditis can include:  Chest pain. Chest pain symptoms may increase when laying down and may be relieved when sitting up and leaning forward.  A chronic, dry cough.  Heart palpitations. These may feel like rapid, fluttering or pounding heart beats.  Chest pain may be worse when swallowing.  Dizziness or fainting.  Tiredness, fatigue or lethargy.  Fever. DIAGNOSIS  Pericarditis is diagnosed by the following:  A physical exam. A heart sound called a pericardial friction rub may be heard when your caregiver listens to your heart.  Blood work. Blood may be drawn to check for an infection and to look at  your blood chemistry.  Electrocardiography. During electrocardiography your heart's electrical activity is monitored and recorded with a tracing on paper (electrocardiogram [ECG]).  Echocardiography.  Computed tomography (CT).  Magnetic resonance image (MRI). TREATMENT  To treat pericarditis, it is important to know the cause of it. The cause of pericarditis determines the treatment.   If the cause of pericarditis is due to an infection, treatment is based on the type of infection. If an infection is suspected in the pericardial fluid, a procedure called a pericardial fluid culture and biopsy may be done. This takes a sample of the pericardial fluid. The sample is sent to a lab which runs tests on the pericardial fluid to check for an infection.  If the autoimmune disease is the cause, treatment of the autoimmune condition will help improve the pericarditis.  If the cause of pericarditis is not known, anti-inflammatory medicines may be used to help decrease the inflammation.  Surgery may be needed. The following are types of surgeries or procedures that may be done to treat pericarditis:  Pericardial window. A pericardial window makes a cut (incision) into the pericardial sac. This allows excess fluid in the pericardium to drain.  Pericardiocentesis. A pericardiocentesis is also known as a pericardial tap. This procedure uses a needle that is guided by X-ray to drain (aspirate) excess fluid from the pericardium.  Pericardiectomy. A pericardiectomy removes part or all of the pericardium. HOME CARE INSTRUCTIONS   Do not smoke. If you smoke, quit. Your caregiver can help you quit smoking.  Maintain a healthy weight.  Follow an exercise program as directed by your health care provider. You may need to limit your exercising until your symptoms go away.  If you drink alcohol, do so in moderation.  Eat a heart healthy diet. A registered dietitian can help you learn about healthy food  choices.  Keep a list of all your medicines with you at all times. Include the name, dose, how often it is taken and how it is taken. SEEK IMMEDIATE MEDICAL CARE IF:   You have chest pain or feelings of chest pressure.  You have sweating (diaphoresis) when at rest.  You have irregular heartbeats (palpitations).  You have rapid, racing heart beats.  You have unexplained fainting episodes.  You feel sick to your stomach (nausea) or vomiting without cause.  You have unexplained weakness. If you develop any of the symptoms which originally made you seek care, call for local emergency medical help. Do not drive yourself to the hospital.   This information is not intended to replace advice given to you by your health care provider. Make sure you discuss any questions you have with your health care provider.   Document Released: 04/11/2001 Document Revised: 03/02/2015 Document Reviewed: 04/28/2015 Elsevier Interactive Patient Education 2016 [employer]  Angina Pectoris Angina pectoris, often called angina, is extreme discomfort in the chest, neck, or arm. This is caused by a lack of blood in the middle and thickest layer of the heart wall (myocardium). There are four types of angina:  Stable angina. Stable angina usually occurs in episodes of predictable frequency and duration. It is usually brought on by physical activity, stress, or excitement. Stable angina usually lasts a few minutes and can often be relieved by a medicine that you place under your tongue. This medicine is called sublingual nitroglycerin.  Unstable angina. Unstable angina can occur even when you are doing little or no physical activity. It can even occur while you are sleeping or when you are at rest. It can suddenly increase in severity or frequency. It may not be relieved by sublingual nitroglycerin, and it can last up to 30 minutes.  Microvascular angina. This type of angina is caused by a disorder of tiny blood  vessels called arterioles. Microvascular angina is more common in women. The pain may be more severe and last longer than other types of angina pectoris.  Prinzmetal or variant angina. This type of angina pectoris is rare and usually occurs when you are doing little or no physical activity. It especially occurs in the early morning hours. CAUSES Atherosclerosis is the cause of angina. This is the buildup of fat and cholesterol (plaque) on the inside of the arteries. Over time, the plaque may narrow or block the artery, and this will lessen blood flow to the heart. Plaque can also become weak and break off within a coronary artery to form a clot and cause a sudden blockage. RISK FACTORS Risk factors common to both men and women include:  High cholesterol levels.  High blood pressure (hypertension).  Tobacco use.  Diabetes.  Family history of angina.  Obesity.  Lack of exercise.  A diet high in saturated fats. Women are at greater risk for angina if they are:  Over age 33.  Postmenopausal. SYMPTOMS Many people do not experience any symptoms during the early stages of angina. As the condition progresses, symptoms common to both men and women may include:  Chest pain.  The pain can be described as a crushing or squeezing in the chest, or a tightness, pressure, fullness, or heaviness in the chest.  The pain can last more than a few minutes, or it can stop and recur.  Pain in the arms, neck, jaw, or  back.  Unexplained heartburn or indigestion.  Shortness of breath.  Nausea.  Sudden cold sweats.  Sudden light-headedness. Many women have chest discomfort and some of the other symptoms. However, women often have different (atypical) symptoms, such as:   Fatigue.  Unexplained feelings of nervousness or anxiety.  Unexplained weakness.  Dizziness or fainting. Sometimes, women may have angina without any symptoms. DIAGNOSIS  Tests to diagnose angina may include:  ECG  (electrocardiogram).  Exercise stress test. This looks for signs of blockage when the heart is being exercised.  Pharmacologic stress test. This test looks for signs of blockage when the heart is being stressed with a medicine.  Blood tests.  Coronary angiogram. This is a procedure to look at the coronary arteries to see if there is any blockage. TREATMENT  The treatment of angina may include the following:  Healthy behavioral changes to reduce or control risk factors.  Medicine.  Coronary stenting.A stent helps to keep an artery open.  Coronary angioplasty. This procedure widens a narrowed or blocked artery.  Coronary arterybypass surgery. This will allow your blood to pass the blockage (bypass) to reach your heart. HOME CARE INSTRUCTIONS   Take medicines only as directed by your health care provider.  Do not take the following medicines unless your health care provider approves:  Nonsteroidal anti-inflammatory drugs (NSAIDs), such as ibuprofen, naproxen, or celecoxib.  Vitamin supplements that contain vitamin A, vitamin E, or both.  Hormone replacement therapy that contains estrogen with or without progestin.  Manage other health conditions such as hypertension and diabetes as directed by your health care provider.  Follow a heart-healthy diet. A dietitian can help to educate you about healthy food options and changes.  Use healthy cooking methods such as roasting, grilling, broiling, baking, poaching, steaming, or stir-frying. Talk to a dietitian to learn more about healthy cooking methods.  Follow an exercise program approved by your health care provider.  Maintain a healthy weight. Lose weight as approved by your health care provider.  Plan rest periods when fatigued.  Learn to manage stress.  Do not use any tobacco products, including cigarettes, chewing tobacco, or electronic cigarettes. If you need help quitting, ask your health care provider.  If you drink  alcohol, and your health care provider approves, limit your alcohol intake to no more than 1 drink per day. One drink equals 12 ounces of beer, 5 ounces of wine, or 1 ounces of hard liquor.  Stop illegal drug use.  Keep all follow-up visits as directed by your health care provider. This is important. SEEK IMMEDIATE MEDICAL CARE IF:   You have pain in your chest, neck, arm, jaw, stomach, or back that lasts more than a few minutes, is recurring, or is unrelieved by taking sublingualnitroglycerin.  You have profuse sweating without cause.  You have unexplained:  Heartburn or indigestion.  Shortness of breath or difficulty breathing.  Nausea or vomiting.  Fatigue.  Feelings of nervousness or anxiety.  Weakness.  Diarrhea.  You have sudden light-headedness or dizziness.  You faint. These symptoms may represent a serious problem that is an emergency. Do not wait to see if the symptoms will go away. Get medical help right away. Call your local emergency services (911 in the [place] Do not drive yourself to the hospital.   This information is not intended to replace advice given to you by your health care provider. Make sure you discuss any questions you have with your health care provider.   Document Released: 10/16/2005  Document Revised: 11/06/2014 Document Reviewed: 02/17/2014 Elsevier Interactive Patient Education [employer].

## 2016-02-09 NOTE — Progress Notes (Signed)
SUBJECTIVE:  No complaints  OBJECTIVE:   Vitals:   Filed Vitals:   02/08/16 1857 02/08/16 2011 02/08/16 2230 02/09/16 0439  BP: 137/88 146/86  145/82  Pulse: 80 86    Temp:  98.5 F (36.9 C)  98.2 F (36.8 C)  TempSrc:  Oral  Oral  Resp: 18 18  16   Height:      Weight:    284 lb 3.2 oz (128.912 kg)  SpO2: 100% 100% 100% 100%   I&O's:   Intake/Output Summary (Last 24 hours) at 02/09/16 0809 Last data filed at 02/09/16 0442  Gross per 24 hour  Intake    240 ml  Output   1300 ml  Net  -1060 ml   TELEMETRY: Reviewed telemetry pt in :     PHYSICAL EXAM General: Well developed, well nourished, in no acute distress Head: Eyes PERRLA, No xanthomas.   Normal cephalic and atramatic  Lungs:   Clear bilaterally to auscultation and percussion. Heart:   HRRR S1 S2 Pulses are 2+ & equal. Abdomen: Bowel sounds are positive, abdomen soft and non-tender without masses  Extremities:   No clubbing, cyanosis or edema.  DP +1 Neuro: Alert and oriented X 3. Psych:  Good affect, responds appropriately   LABS: Basic Metabolic Panel:  Recent Labs  33/29/51 0345  NA 135  K 3.9  CL 102  CO2 26  GLUCOSE 182*  BUN 9  CREATININE 0.64  CALCIUM 9.0   Liver Function Tests: No results for input(s): AST, ALT, ALKPHOS, BILITOT, PROT, ALBUMIN in the last 72 hours. No results for input(s): LIPASE, AMYLASE in the last 72 hours. CBC:  Recent Labs  02/08/16 0345 02/09/16 0700  WBC 10.0 9.0  NEUTROABS 6.8  --   HGB 12.7 12.2  HCT 38.7 37.2  MCV 85.4 85.5  PLT 200 193   Cardiac Enzymes:  Recent Labs  02/08/16 0841 02/08/16 1421 02/08/16 2117  TROPONINI <0.03 <0.03 <0.03   BNP: Invalid input(s): POCBNP D-Dimer: No results for input(s): DDIMER in the last 72 hours. Hemoglobin A1C: No results for input(s): HGBA1C in the last 72 hours. Fasting Lipid Panel: No results for input(s): CHOL, HDL, LDLCALC, TRIG, CHOLHDL, LDLDIRECT in the last 72 hours. Thyroid Function  Tests: No results for input(s): TSH, T4TOTAL, T3FREE, THYROIDAB in the last 72 hours.  Invalid input(s): FREET3 Anemia Panel: No results for input(s): VITAMINB12, FOLATE, FERRITIN, TIBC, IRON, RETICCTPCT in the last 72 hours. Coag Panel:   Lab Results  Component Value Date   INR 1.12 02/08/2016    RADIOLOGY: No results found.  Assessment & Plan   1. Chest pain - noncardiac - reports having pressure down her left arm which radiates into the left-side of her chest. Presents with exertion and associated symptoms include dyspnea and nausea. Has been progressing in severity for the past few months. - last cardiac cath was in 2011 and showed normal coronary arteries with an EF of 35-40%.Pateint reports her EF was 20% in 2016 at time of ICD placement but a repeat cath was not performed. - NST on 02/01/2016 showed no scintigraphic evidence of prior infarct or inducible ischemia but global hypokinesia with akinesia involving the inferior and lateral walls of the left ventricle were noted and an EF of 25% was reported at that time.  - Initial troponin is negative. EKG shows NSR, HR 88, with nonspecific intraventricular conduction delay. Reports resolution of her pain after being started on IV NTG and Heparin. Lipid Panel pending.  -  Cath yesterday showed normal coronary arteries and elevated LVEDP at .   - check BMET post cath  2. 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 EF noted to be 35-40% by cath in 2011, per patient's report was 20% at time of ICD implantation. - repeat echocardiogram pending. - continue PTA Lasix, Spironolactone, and BB. Was on Cardizem CD  daily PTA--> stopped due to reduced EF of 25% . - Add Lisinopril  daily for afterload reduction.    3. HTN - currently elevated secondary to stopping Cardizem. - continue medication regimen. - Add Lisinopril  daily.  4. Type 2 DM - Restart  Metformin 4/14 - continue home Lantus and SSI.  5. Sarcoidosis - continue PTA Plaquenil.      Will check 2D echo results and BMET and if stable then she is ok for discharge home today with followup with her Cardiologist at [facility].  No metformin until 4/14.  She will need early followup with [facility] and BMET in 1 week.   Quintella Reichert, MD  02/09/2016  8:09 AM

## 2016-02-09 NOTE — Discharge Summary (Signed)
Discharge Summary    Patient ID: Alicia Cook,  MRN: [id number], DOB/AGE: 1966/01/21 50 y.o.  Admit date: 02/08/2016 Discharge date: 02/09/2016  Primary Care Provider: COUSINS, Cabela A Primary Cardiologist: [facility] - Dr. Rudolpho Sevin  Discharge Diagnoses    Principal Problem:   Chest pain Active Problems:   Sarcoidosis (HCC)   Benign essential HTN   DCM (dilated cardiomyopathy) (HCC)   DM (diabetes mellitus), type 2 (HCC)   Chronic systolic CHF (congestive heart failure) (HCC)   History of Present Illness     Alicia Cook is a 50 y.o. female with past medical history of nonischemic cardiomyopathy (reported EF of 25%, s/p ICD placement 2016), HTN, Type 2 DM, tobacco abuse, and Sarcoidosis who presented to [facility] on 02/08/2016 for evaluation of left arm pain radiating into her chest.  Over the past few months, she had developed a pressure going down her left arm and radiating into the left side of her chest. It was associated with dyspnea and nausea and occured with exertional acticity. She had been evaluated over 3 times in the ED for the pain and was given Morphine and had resolution of her symptoms. She was subseuently discharged and told to follow-up with her Cardiologist but she was unable to be fit in for an appointment.   Last cardiac catheterization was in 2011 and showed angiographically normal coronary arteries. 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 EF by echocardiogram 35-40%. PAP = 24 mm Hg. She had an ICD placed in 12/2014 and she believes her EF was "20%" at that time and says "it was placed because of my sarcoidosis". She denies having a repeat cath in recent years, saying she only ever had one in her entire life.  Underwent recent stress testing on 02/01/2016 which showed no scintigraphic evidence of prior infarct or inducible ischemia. Global hypokinesia with akinesia involving the inferior and lateral walls of the left ventricle  were noted and an EF of 25% was reported at that time. Overall, the test was high-risk.  She continued to have chest pain despite the negative NST and that prompted her to seek further evaluation. Her initial EKG showed no acute abnormalities and initial troponin was negative, She was started on Heparin along with IV NTG and transferred to [facility] for further evaluation.   Hospital Course     Consultants: None   Upon arrival, she reported her pain had resolved since being started on IV NTG. Cyclic troponin values remained negative. With her continued symptoms over the past several weeks, reduced EF, and recent negative NST a cardiac catheterization was recommended. The risks and benefits of the procedure were explained to her in detail and she agreed to proceed.  Her cardiac catheterization showed normal coronaries with a significantly reduced EF. She tolerated the procedure well and no complications were noted. An echocardiogram was performed to further assess her EF and to check for a pericardial effusion, possibly secondary to pericarditis. Her echo did show an EF of 25-30% with a trivial pericardial effusion. With her symptoms, Dr. Mayford Knife recommended treating the patient for pericarditis with Ibuprofen 600mg  TID for [redacted] week along with Colchicine 0.6mg  daily until seen by her Primary Cardiologist.She was given prescriptions for both of these medications.   Of note, she had been on Cardizem and Lopressor prior to admission. This was stopped and switched to Coreg during her admission secondary to her reduced EF. She was also started on an ACE-I with her reduced  EF. She was instructed to resume Metformin 48 hours following her cardiac catheterization.  She was last examined by Dr. Mayford Knife and deemed stable for discharge. Cardiology follow-up at [facility] has been arranged for 02/23/2016. She was discharged home in stable condition.  _____________  Discharge Vitals Blood pressure 130/73,  pulse 81, temperature 98.2 F (36.8 C), temperature source Oral, resp. rate 16, height  (1.702 m), weight 284 lb 3.2 oz (128.912 kg), last menstrual period 10/31/2015, SpO2 99 %.  Filed Weights   02/08/16 0207 02/08/16 0814 02/09/16 0439  Weight: 283 lb 3.2 oz (128.459 kg) 283 lb 1.6 oz (128.413 kg) 284 lb 3.2 oz (128.912 kg)    Labs & Radiologic Studies     CBC  Recent Labs  02/08/16 0345 02/09/16 0700  WBC 10.0 9.0  NEUTROABS 6.8  --   HGB 12.7 12.2  HCT 38.7 37.2  MCV 85.4 85.5  PLT 200 193   Basic Metabolic Panel  Recent Labs  02/08/16 0345 02/09/16 0700  NA 135 139  K 3.9 4.0  CL 102 107  CO2 26 23  GLUCOSE 182* 110*  BUN 9 8  CREATININE 0.64 0.60  CALCIUM 9.0 8.8*   Liver Function Tests No results for input(s): AST, ALT, ALKPHOS, BILITOT, PROT, ALBUMIN in the last 72 hours. No results for input(s): LIPASE, AMYLASE in the last 72 hours. Cardiac Enzymes  Recent Labs  02/08/16 0841 02/08/16 1421 02/08/16 2117  TROPONINI <0.03 <0.03 <0.03   BNP Invalid input(s): POCBNP D-Dimer No results for input(s): DDIMER in the last 72 hours. Hemoglobin A1C No results for input(s): HGBA1C in the last 72 hours. Fasting Lipid Panel  Recent Labs  02/09/16 0700  CHOL 165  HDL 64  LDLCALC 82  TRIG 94  CHOLHDL 2.6    Diagnostic Studies/Procedures     Cardiac Catheterization: 02/08/2016  No signficant CAD.  Elevated LVEDP.  Chest pressure not related to ischemia.   Moderately elevated LVEDP. Discussed with Dr. Mayford Knife. She will need some diuresis with likely discharge tomorrow.    Echocardiogram: 02/09/2016 Study Conclusions - Left ventricle: The cavity size was moderately dilated. There was  mild focal basal hypertrophy of the septum. Systolic function was  severely reduced. The estimated ejection fraction was in the  range of 25% to 30%. Images were inadequate for LV wall motion  assessment. Features are consistent with a pseudonormal  left  ventricular filling pattern, with concomitant abnormal relaxation  and increased filling pressure (grade 2 diastolic dysfunction).  Doppler parameters are consistent with high ventricular filling  pressure. - Ventricular septum: Septal motion showed moderate paradox. These  changes are consistent with intraventricular conduction delay. - Aortic valve: Trileaflet; normal thickness, mildly calcified  leaflets. - Mitral valve: Calcified annulus. - Pericardium, extracardiac: A trivial pericardial effusion was  identified posterior to the heart.    Disposition   Pt is being discharged home today in good condition.  Follow-up Plans & Appointments    Follow-up Information    Follow up with Fredric Dine, PA-C On 02/23/2016.   Specialty:  Cardiology   Why:  Cardiology Hospital Follow-Up on 02/23/2016 at 10:00AM. (Dr. Jerrell Mylar Office)   Contact information:   [address] [place] [address] [phone]      Discharge Instructions    Diet - low sodium heart healthy    Complete by:  As directed      Discharge instructions    Complete by:  As directed  PLEASE REMEMBER TO BRING ALL OF YOUR MEDICATIONS TO EACH OF YOUR FOLLOW-UP OFFICE VISITS.  PLEASE ATTEND ALL SCHEDULED FOLLOW-UP APPOINTMENTS.   Activity: Increase activity slowly as tolerated. You may shower, but no soaking baths (or swimming) for 1 week. No driving for 24 hours. No lifting over 5 lbs for 1 week. No sexual activity for 1 week.   Wound Care: You may wash cath site gently with soap and water. Keep cath site clean and dry. If you notice pain, swelling, bleeding or pus at your cath site, please call [phone].     Increase activity slowly    Complete by:  As directed            Discharge Medications   Current Discharge Medication List    START taking these medications   Details  carvedilol (COREG) 6.25 MG tablet Take 1 tablet (6.25 mg total) by mouth 2 (two) times daily  with a meal. Qty: 60 tablet, Refills: 6    colchicine 0.6 MG tablet Take 1 tablet (0.6 mg total) by mouth daily. Qty: 30 tablet, Refills: 3    lisinopril (PRINIVIL,ZESTRIL) 10 MG tablet Take 1 tablet (10 mg total) by mouth daily. Qty: 30 tablet, Refills: 6      CONTINUE these medications which have CHANGED   Details  ibuprofen (ADVIL,MOTRIN) 600 MG tablet Take 1 tablet (600mg ) by mouth 3 times daily for 7 days then stop. Qty: 21 tablet, Refills: 0      CONTINUE these medications which have NOT CHANGED   Details  aspirin 81 MG chewable tablet Chew 81 mg by mouth daily.    cyclobenzaprine (FLEXERIL) 10 MG tablet Take 10 mg by mouth 2 (two) times daily as needed for muscle spasms.    FLUoxetine (PROZAC) 40 MG capsule Take 40 mg by mouth daily.    folic acid (FOLVITE) 1 MG tablet Take 1 mg by mouth daily.    furosemide (LASIX) 20 MG tablet Take 40 mg by mouth daily.    HYDROcodone-acetaminophen (NORCO/VICODIN) 5-325 MG tablet Take 1-2 tablets by mouth every 6 (six) hours as needed for moderate pain. Qty: 10 tablet, Refills: 0    hydroxychloroquine (PLAQUENIL) 200 MG tablet Take 400 mg by mouth daily.    insulin glargine (LANTUS) 100 UNIT/ML injection Inject 28 Units into the skin at bedtime.    insulin lispro (HUMALOG) 100 UNIT/ML injection Inject 1-10 Units into the skin 3 (three) times daily before meals.    metFORMIN (GLUCOPHAGE) 500 MG tablet Take 2,000 mg by mouth daily with breakfast.    methotrexate 2.5 MG tablet Take 15 mg by mouth once a week.     ranitidine (ZANTAC) 150 MG tablet Take 150 mg by mouth 2 (two) times daily.    sitaGLIPtin (JANUVIA) 100 MG tablet Take 100 mg by mouth daily.    spironolactone (ALDACTONE) 25 MG tablet Take 25 mg by mouth daily.    traZODone (DESYREL) 150 MG tablet Take 150 mg by mouth at bedtime.    fluticasone (FLONASE) 50 MCG/ACT nasal spray Place 1 spray into both nostrils daily. Reported on 02/09/2016      STOP taking these  medications     diltiazem (DILACOR XR) 240 MG 24 hr capsule      metoprolol tartrate (LOPRESSOR) 25 MG tablet      naproxen (NAPROSYN) 500 MG tablet      traMADol (ULTRAM) 50 MG tablet         Allergies No Known Allergies  Outstanding Labs/Studies  None  Duration of Discharge Encounter   Greater than 30 minutes including physician time.  Signed, Ellsworth Lennox, PA-C 02/09/2016, 4:04 PM

## 2016-04-17 ENCOUNTER — Encounter (HOSPITAL_BASED_OUTPATIENT_CLINIC_OR_DEPARTMENT_OTHER): Payer: Self-pay | Admitting: Emergency Medicine

## 2016-04-17 ENCOUNTER — Other Ambulatory Visit: Payer: Self-pay

## 2016-04-17 ENCOUNTER — Emergency Department (HOSPITAL_BASED_OUTPATIENT_CLINIC_OR_DEPARTMENT_OTHER): Payer: Medicaid Other

## 2016-04-17 ENCOUNTER — Emergency Department (HOSPITAL_BASED_OUTPATIENT_CLINIC_OR_DEPARTMENT_OTHER)
Admission: EM | Admit: 2016-04-17 | Discharge: 2016-04-17 | Disposition: A | Discharge status: Home / Self Care | Payer: Medicaid Other | Attending: Emergency Medicine | Admitting: Emergency Medicine

## 2016-04-17 DIAGNOSIS — Z794 Long term (current) use of insulin: Secondary | ICD-10-CM | POA: Diagnosis not present

## 2016-04-17 DIAGNOSIS — Z7984 Long term (current) use of oral hypoglycemic drugs: Secondary | ICD-10-CM | POA: Diagnosis not present

## 2016-04-17 DIAGNOSIS — R0600 Dyspnea, unspecified: Secondary | ICD-10-CM

## 2016-04-17 DIAGNOSIS — F329 Major depressive disorder, single episode, unspecified: Secondary | ICD-10-CM | POA: Insufficient documentation

## 2016-04-17 DIAGNOSIS — I110 Hypertensive heart disease with heart failure: Secondary | ICD-10-CM | POA: Diagnosis not present

## 2016-04-17 DIAGNOSIS — Z87891 Personal history of nicotine dependence: Secondary | ICD-10-CM | POA: Diagnosis not present

## 2016-04-17 DIAGNOSIS — Z7982 Long term (current) use of aspirin: Secondary | ICD-10-CM | POA: Insufficient documentation

## 2016-04-17 DIAGNOSIS — R002 Palpitations: Secondary | ICD-10-CM

## 2016-04-17 DIAGNOSIS — R0609 Other forms of dyspnea: Secondary | ICD-10-CM

## 2016-04-17 DIAGNOSIS — Z79899 Other long term (current) drug therapy: Secondary | ICD-10-CM | POA: Insufficient documentation

## 2016-04-17 DIAGNOSIS — R0602 Shortness of breath: Secondary | ICD-10-CM | POA: Diagnosis present

## 2016-04-17 DIAGNOSIS — I509 Heart failure, unspecified: Secondary | ICD-10-CM | POA: Insufficient documentation

## 2016-04-17 DIAGNOSIS — R0789 Other chest pain: Secondary | ICD-10-CM | POA: Insufficient documentation

## 2016-04-17 LAB — CBC WITH DIFFERENTIAL/PLATELET
Basophils Absolute: 0 10*3/uL (ref 0.0–0.1)
Basophils Relative: 0 %
Eosinophils Absolute: 0.1 10*3/uL (ref 0.0–0.7)
Eosinophils Relative: 2 %
HCT: 34.4 % — ABNORMAL LOW (ref 36.0–46.0)
Hemoglobin: 11.4 g/dL — ABNORMAL LOW (ref 12.0–15.0)
Lymphocytes Relative: 28 %
Lymphs Abs: 1.8 10*3/uL (ref 0.7–4.0)
MCH: 28.2 pg (ref 26.0–34.0)
MCHC: 33.1 g/dL (ref 30.0–36.0)
MCV: 85.1 fL (ref 78.0–100.0)
Monocytes Absolute: 0.7 10*3/uL (ref 0.1–1.0)
Monocytes Relative: 11 %
Neutro Abs: 3.9 10*3/uL (ref 1.7–7.7)
Neutrophils Relative %: 59 %
Platelets: 237 10*3/uL (ref 150–400)
RBC: 4.04 MIL/uL (ref 3.87–5.11)
RDW: 12.7 % (ref 11.5–15.5)
WBC: 6.6 10*3/uL (ref 4.0–10.5)

## 2016-04-17 LAB — COMPREHENSIVE METABOLIC PANEL
ALT: 23 U/L (ref 14–54)
AST: 21 U/L (ref 15–41)
Albumin: 3.9 g/dL (ref 3.5–5.0)
Alkaline Phosphatase: 72 U/L (ref 38–126)
Anion gap: 7 (ref 5–15)
BUN: 10 mg/dL (ref 6–20)
CO2: 25 mmol/L (ref 22–32)
Calcium: 8.7 mg/dL — ABNORMAL LOW (ref 8.9–10.3)
Chloride: 105 mmol/L (ref 101–111)
Creatinine, Ser: 0.64 mg/dL (ref 0.44–1.00)
GFR calc Af Amer: >60 mL/min (ref >60)
GFR calc non Af Amer: >60 mL/min (ref >60)
Glucose, Bld: 106 mg/dL — ABNORMAL HIGH (ref 65–99)
Potassium: 3.8 mmol/L (ref 3.5–5.1)
Sodium: 137 mmol/L (ref 135–145)
Total Bilirubin: 0.3 mg/dL (ref 0.3–1.2)
Total Protein: 7 g/dL (ref 6.5–8.1)

## 2016-04-17 LAB — BRAIN NATRIURETIC PEPTIDE: B Natriuretic Peptide: 140.3 pg/mL — ABNORMAL HIGH (ref 0.0–100.0)

## 2016-04-17 LAB — TROPONIN I: Troponin I: <0.03 ng/mL (ref <0.031)

## 2016-04-17 NOTE — ED Provider Notes (Signed)
CSN: [id number]     Arrival date & time 04/17/16  1207 History   First MD Initiated Contact with Patient 04/17/16 1217     Chief Complaint  Patient presents with  . Shortness of Breath     (Consider location/radiation/quality/duration/timing/severity/associated sxs/prior Treatment) HPI Patient presents with several days of shortness of breath worse with exertion. She also complains of left-sided palpitations and "discomfort". No recent medication changes. No new lower extremity swelling or pain. Patient denies any fever or chills. She has had a cough. Denies any current palpitations. Describes palpitations as episodic and sensation of heart racing. Past Medical History  Diagnosis Date  . CHF (congestive heart failure) (HCC)   . Depression   . Sarcoidosis (HCC)   . Diabetes mellitus without complication (HCC)   . Hypertension   . Nonischemic cardiomyopathy (HCC)     a. EF 35-40% by cath in 2011 with normal cors b. s/p ICD implantation in 12/2014 c. EF 25% by NST in 02/01/2016  . Sarcoidosis (HCC) 02/08/2016   Past Surgical History  Procedure Laterality Date  . Cesarean section    . Insertion of icd      a. s/p dual-chamber [employer] ICD 12/2014  . Abdominal surgery    . Cholecystectomy    . Tubal ligation    . Cardiac catheterization N/A 02/08/2016    Procedure: Left Heart Cath and Coronary Angiography;  Surgeon: Corky Crafts, MD;  Location: [facility] INVASIVE CV LAB;  Service: Cardiovascular;  Laterality: N/A;   Family History  Problem Relation Age of Onset  . Heart attack Father     a. Initial onset in his 87's. S/p CABG  . Heart failure Father    Social History  Substance Use Topics  . Smoking status: Former Smoker -- 1.00 packs/day for 20 years    Types: Cigarettes  . Smokeless tobacco: None  . Alcohol Use: No   OB History    No data available     Review of Systems  Constitutional: Negative for fever and chills.  Respiratory: Positive for cough and  shortness of breath. Negative for chest tightness.   Cardiovascular: Positive for chest pain and palpitations. Negative for leg swelling.  Gastrointestinal: Negative for nausea, vomiting, abdominal pain and diarrhea.  Musculoskeletal: Negative for myalgias, back pain and neck pain.  Skin: Negative for rash and wound.  Neurological: Negative for dizziness, weakness, light-headedness, numbness and headaches.  All other systems reviewed and are negative.     Allergies  Review of patient's allergies indicates no known allergies.  Home Medications   Prior to Admission medications   Medication Sig Start Date End Date Taking? Authorizing Provider  aspirin 81 MG chewable tablet Chew 81 mg by mouth daily.    Historical Provider, MD  carvedilol (COREG) 6.25 MG tablet Take 1 tablet (6.25 mg total) by mouth 2 (two) times daily with a meal. 02/09/16   Ellsworth Lennox, PA  colchicine 0.6 MG tablet Take 1 tablet (0.6 mg total) by mouth daily. 02/09/16   Ellsworth Lennox, PA  cyclobenzaprine (FLEXERIL) 10 MG tablet Take 10 mg by mouth 2 (two) times daily as needed for muscle spasms.    Historical Provider, MD  FLUoxetine (PROZAC) 40 MG capsule Take 40 mg by mouth daily.    Historical Provider, MD  fluticasone (FLONASE) 50 MCG/ACT nasal spray Place 1 spray into both nostrils daily. Reported on 02/09/2016    Historical Provider, MD  folic acid (FOLVITE) 1 MG tablet Take 1 mg  by mouth daily.    Historical Provider, MD  furosemide (LASIX) 20 MG tablet Take 40 mg by mouth daily.    Historical Provider, MD  HYDROcodone-acetaminophen (NORCO/VICODIN) 5-325 MG tablet Take 1-2 tablets by mouth every 6 (six) hours as needed for moderate pain. 08/13/15   Vanetta Mulders, MD  hydroxychloroquine (PLAQUENIL) 200 MG tablet Take 400 mg by mouth daily.    Historical Provider, MD  ibuprofen (ADVIL,MOTRIN) 600 MG tablet Take 1 tablet ( ) by mouth 3 times daily for 7 days then stop. 02/09/16   Ellsworth Lennox, PA   insulin glargine (LANTUS) 100 UNIT/ML injection Inject 28 Units into the skin at bedtime.    Historical Provider, MD  insulin lispro (HUMALOG) 100 UNIT/ML injection Inject 1-10 Units into the skin 3 (three) times daily before meals.    Historical Provider, MD  lisinopril (PRINIVIL,ZESTRIL) 10 MG tablet Take 1 tablet (10 mg total) by mouth daily. 02/09/16   Ellsworth Lennox, PA  metFORMIN (GLUCOPHAGE) 500 MG tablet Take 2,000 mg by mouth daily with breakfast.    Historical Provider, MD  methotrexate 2.5 MG tablet Take 15 mg by mouth once a week.     Historical Provider, MD  ranitidine (ZANTAC) 150 MG tablet Take 150 mg by mouth 2 (two) times daily.    Historical Provider, MD  sitaGLIPtin (JANUVIA) 100 MG tablet Take 100 mg by mouth daily.    Historical Provider, MD  spironolactone (ALDACTONE) 25 MG tablet Take 25 mg by mouth daily.    Historical Provider, MD  traZODone (DESYREL) 150 MG tablet Take 150 mg by mouth at bedtime.    Historical Provider, MD   BP 126/72 mmHg  Pulse 82  Temp(Src) 99.1 F (37.3 C) (Oral)  Resp 20  Ht  (1.702 m)  Wt 288 lb (130.636 kg)  BMI 45.10 kg/m2  SpO2 100%  LMP 04/17/2016 Physical Exam  Constitutional: She is oriented to person, place, and time. She appears well-developed and well-nourished. No distress.  HENT:  Head: Normocephalic and atraumatic.  Mouth/Throat: Oropharynx is clear and moist. No oropharyngeal exudate.  Eyes: EOM are normal. Pupils are equal, round, and reactive to light.  Neck: Normal range of motion. Neck supple. No JVD present.  Cardiovascular: Normal rate and regular rhythm.  Exam reveals no gallop and no friction rub.   Murmur heard. Pulmonary/Chest: Effort normal and breath sounds normal. No respiratory distress. She has no wheezes. She has no rales. She exhibits no tenderness.  Pacemaker palpated in the left upper chest. No erythema, warmth or tenderness.  Abdominal: Soft. Bowel sounds are normal. She exhibits no distension  and no mass. There is no tenderness. There is no rebound and no guarding.  Musculoskeletal: Normal range of motion. She exhibits no edema or tenderness.  No lower extremity swelling or asymmetry. Distal pulses are equal and intact.  Neurological: She is alert and oriented to person, place, and time.  Skin: Skin is warm and dry. No rash noted. No erythema.  Psychiatric: She has a normal mood and affect. Her behavior is normal.  Nursing note and vitals reviewed.   ED Course  Procedures (including critical care time) Labs Review Labs Reviewed  CBC WITH DIFFERENTIAL/PLATELET - Abnormal; Notable for the following:    Hemoglobin 11.4 (*)    HCT 34.4 (*)    All other components within normal limits  COMPREHENSIVE METABOLIC PANEL - Abnormal; Notable for the following:    Glucose, Bld 106 (*)    Calcium 8.7 (*)  All other components within normal limits  BRAIN NATRIURETIC PEPTIDE - Abnormal; Notable for the following:    B Natriuretic Peptide 140.3 (*)    All other components within normal limits  TROPONIN I    Imaging Review Dg Chest 2 View  04/17/2016  CLINICAL DATA:  Shortness of breath for few days. History of sarcoidosis. EXAM: CHEST  2 VIEW COMPARISON:  Chest CT 02/07/2016 FINDINGS: Left AICD in place. Cardiomegaly. No confluent opacity, effusion or edema. Mediastinal contours are within normal limits. No acute bony abnormality. IMPRESSION: Cardiomegaly.  No active disease. Electronically Signed   By: Charlett Nose M.D.   On: 04/17/2016 12:36   I have personally reviewed and evaluated these images and lab results as part of my medical decision-making.   EKG Interpretation   Date/Time:  Monday April 17 2016 12:42:03 EDT Ventricular Rate:  87 PR Interval:    QRS Duration: 119 QT Interval:  436 QTC Calculation: 525 R Axis:   -106 Text Interpretation:  Atrial-sensed ventricular-paced rhythm No further  analysis attempted due to paced rhythm Confirmed by Ranae Palms  MD, Therisa Mennella   [phone]) on 04/17/2016 12:57:38 PM      MDM   Final diagnoses:  Dyspnea on exertion  Intermittent palpitations  Atypical chest pain   No palpitations or chest pain in the emergency department. Patient is very well-appearing. Vital signs are normal and the patient is in no respiratory distress. Mild elevation in BNP. Troponin is normal. Given that she's had these symptoms for several days, single troponin is adequate to rule out acute MI. Patient's been instructed that she needs to follow-up closely with her cardiologist. She's been given return precautions and is voiced understanding.     Loren Racer, MD 04/17/16 1451

## 2016-04-17 NOTE — ED Notes (Signed)
Pt states having SOB for several days but last night it got worse.  Pt talking in full sentences in triage and in NAD

## 2016-04-17 NOTE — ED Notes (Signed)
attempted to interrogate ICD/PM. Unsuccessful.

## 2016-04-17 NOTE — Discharge Instructions (Signed)
Nonspecific Chest Pain  °Chest pain can be caused by many different conditions. There is always a chance that your pain could be related to something serious, such as a heart attack or a blood clot in your lungs. Chest pain can also be caused by conditions that are not life-threatening. If you have chest pain, it is very important to follow up with your health care provider. °CAUSES  °Chest pain can be caused by: °· Heartburn. °· Pneumonia or bronchitis. °· Anxiety or stress. °· Inflammation around your heart (pericarditis) or lung (pleuritis or pleurisy). °· A blood clot in your lung. °· A collapsed lung (pneumothorax). It can develop suddenly on its own (spontaneous pneumothorax) or from trauma to the chest. °· Shingles infection (varicella-zoster virus). °· Heart attack. °· Damage to the bones, muscles, and cartilage that make up your chest wall. This can include: °· Bruised bones due to injury. °· Strained muscles or cartilage due to frequent or repeated coughing or overwork. °· Fracture to one or more ribs. °· Sore cartilage due to inflammation (costochondritis). °RISK FACTORS  °Risk factors for chest pain may include: °· Activities that increase your risk for trauma or injury to your chest. °· Respiratory infections or conditions that cause frequent coughing. °· Medical conditions or overeating that can cause heartburn. °· Heart disease or family history of heart disease. °· Conditions or health behaviors that increase your risk of developing a blood clot. °· Having had chicken pox (varicella zoster). °SIGNS AND SYMPTOMS °Chest pain can feel like: °· Burning or tingling on the surface of your chest or deep in your chest. °· Crushing, pressure, aching, or squeezing pain. °· Dull or sharp pain that is worse when you move, cough, or take a deep breath. °· Pain that is also felt in your back, neck, shoulder, or arm, or pain that spreads to any of these areas. °Your chest pain may come and go, or it may stay  constant. °DIAGNOSIS °Lab tests or other studies may be needed to find the cause of your pain. Your health care provider may have you take a test called an ambulatory ECG (electrocardiogram). An ECG records your heartbeat patterns at the time the test is performed. You may also have other tests, such as: °· Transthoracic echocardiogram (TTE). During echocardiography, sound waves are used to create a picture of all of the heart structures and to look at how blood flows through your heart. °· Transesophageal echocardiogram (TEE). This is a more advanced imaging test that obtains images from inside your body. It allows your health care provider to see your heart in finer detail. °· Cardiac monitoring. This allows your health care provider to monitor your heart rate and rhythm in real time. °· Holter monitor. This is a portable device that records your heartbeat and can help to diagnose abnormal heartbeats. It allows your health care provider to track your heart activity for several days, if needed. °· Stress tests. These can be done through exercise or by taking medicine that makes your heart beat more quickly. °· Blood tests. °· Imaging tests. °TREATMENT  °Your treatment depends on what is causing your chest pain. Treatment may include: °· Medicines. These may include: °· Acid blockers for heartburn. °· Anti-inflammatory medicine. °· Pain medicine for inflammatory conditions. °· Antibiotic medicine, if an infection is present. °· Medicines to dissolve blood clots. °· Medicines to treat coronary artery disease. °· Supportive care for conditions that do not require medicines. This may include: °· Resting. °· Applying heat   or cold packs to injured areas. °· Limiting activities until pain decreases. °HOME CARE INSTRUCTIONS °· If you were prescribed an antibiotic medicine, finish it all even if you start to feel better. °· Avoid any activities that bring on chest pain. °· Do not use any tobacco products, including  cigarettes, chewing tobacco, or electronic cigarettes. If you need help quitting, ask your health care provider. °· Do not drink alcohol. °· Take medicines only as directed by your health care provider. °· Keep all follow-up visits as directed by your health care provider. This is important. This includes any further testing if your chest pain does not go away. °· If heartburn is the cause for your chest pain, you may be told to keep your head raised (elevated) while sleeping. This reduces the chance that acid will go from your stomach into your esophagus. °· Make lifestyle changes as directed by your health care provider. These may include: °· Getting regular exercise. Ask your health care provider to suggest some activities that are safe for you. °· Eating a heart-healthy diet. A registered dietitian can help you to learn healthy eating options. °· Maintaining a healthy weight. °· Managing diabetes, if necessary. °· Reducing stress. °SEEK MEDICAL CARE IF: °· Your chest pain does not go away after treatment. °· You have a rash with blisters on your chest. °· You have a fever. °SEEK IMMEDIATE MEDICAL CARE IF:  °· Your chest pain is worse. °· You have an increasing cough, or you cough up blood. °· You have severe abdominal pain. °· You have severe weakness. °· You faint. °· You have chills. °· You have sudden, unexplained chest discomfort. °· You have sudden, unexplained discomfort in your arms, back, neck, or jaw. °· You have shortness of breath at any time. °· You suddenly start to sweat, or your skin gets clammy. °· You feel nauseous or you vomit. °· You suddenly feel light-headed or dizzy. °· Your heart begins to beat quickly, or it feels like it is skipping beats. °These symptoms may represent a serious problem that is an emergency. Do not wait to see if the symptoms will go away. Get medical help right away. Call your local emergency services (911 in the [place] Do not drive yourself to the hospital. °  °This  information is not intended to replace advice given to you by your health care provider. Make sure you discuss any questions you have with your health care provider. °  °Document Released: 07/26/2005 Document Revised: 11/06/2014 Document Reviewed: 05/22/2014 °Elsevier Interactive Patient Education ©2016 [employer] ° °Palpitations °A palpitation is the feeling that your heartbeat is irregular or is faster than normal. It may feel like your heart is fluttering or skipping a beat. Palpitations are usually not a serious problem. However, in some cases, you may need further medical evaluation. °CAUSES  °Palpitations can be caused by: °· Smoking. °· Caffeine or other stimulants, such as diet pills or energy drinks. °· Alcohol. °· Stress and anxiety. °· Strenuous physical activity. °· Fatigue. °· Certain medicines. °· Heart disease, especially if you have a history of irregular heart rhythms (arrhythmias), such as atrial fibrillation, atrial flutter, or supraventricular tachycardia. °· An improperly working pacemaker or defibrillator. °DIAGNOSIS  °To find the cause of your palpitations, your health care provider will take your medical history and perform a physical exam. Your health care provider may also have you take a test called an ambulatory electrocardiogram (ECG). An ECG records your heartbeat patterns over a 24-hour   period. You may also have other tests, such as: °· Transthoracic echocardiogram (TTE). During echocardiography, sound waves are used to evaluate how blood flows through your heart. °· Transesophageal echocardiogram (TEE). °· Cardiac monitoring. This allows your health care provider to monitor your heart rate and rhythm in real time. °· Holter monitor. This is a portable device that records your heartbeat and can help diagnose heart arrhythmias. It allows your health care provider to track your heart activity for several days, if needed. °· Stress tests by exercise or by giving medicine that makes the  heart beat faster. °TREATMENT  °Treatment of palpitations depends on the cause of your symptoms and can vary greatly. Most cases of palpitations do not require any treatment other than time, relaxation, and monitoring your symptoms. Other causes, such as atrial fibrillation, atrial flutter, or supraventricular tachycardia, usually require further treatment. °HOME CARE INSTRUCTIONS  °· Avoid: °¨ Caffeinated coffee, tea, soft drinks, diet pills, and energy drinks. °¨ Chocolate. °¨ Alcohol. °· Stop smoking if you smoke. °· Reduce your stress and anxiety. Things that can help you relax include: °¨ A method of controlling things in your body, such as your heartbeats, with your mind (biofeedback). °¨ Yoga. °¨ Meditation. °¨ Physical activity such as swimming, jogging, or walking. °· Get plenty of rest and sleep. °SEEK MEDICAL CARE IF:  °· You continue to have a fast or irregular heartbeat beyond 24 hours. °· Your palpitations occur more often. °SEEK IMMEDIATE MEDICAL CARE IF: °· You have chest pain or shortness of breath. °· You have a severe headache. °· You feel dizzy or you faint. °MAKE SURE YOU: °· Understand these instructions. °· Will watch your condition. °· Will get help right away if you are not doing well or get worse. °  °This information is not intended to replace advice given to you by your health care provider. Make sure you discuss any questions you have with your health care provider. °  °Document Released: 10/13/2000 Document Revised: 10/21/2013 Document Reviewed: 12/15/2011 °Elsevier Interactive Patient Education ©2016 [employer] Inc. ° °

## 2016-07-08 ENCOUNTER — Other Ambulatory Visit: Payer: Self-pay | Admitting: Student

## 2016-11-30 DIAGNOSIS — K219 Gastro-esophageal reflux disease without esophagitis: Secondary | ICD-10-CM | POA: Insufficient documentation

## 2017-01-17 DIAGNOSIS — G2581 Restless legs syndrome: Secondary | ICD-10-CM | POA: Insufficient documentation

## 2017-01-17 DIAGNOSIS — J984 Other disorders of lung: Secondary | ICD-10-CM | POA: Insufficient documentation

## 2017-02-11 ENCOUNTER — Emergency Department (HOSPITAL_BASED_OUTPATIENT_CLINIC_OR_DEPARTMENT_OTHER): Payer: Medicaid Other

## 2017-02-11 ENCOUNTER — Encounter (HOSPITAL_BASED_OUTPATIENT_CLINIC_OR_DEPARTMENT_OTHER): Payer: Self-pay | Admitting: *Deleted

## 2017-02-11 ENCOUNTER — Emergency Department (HOSPITAL_BASED_OUTPATIENT_CLINIC_OR_DEPARTMENT_OTHER)
Admission: EM | Admit: 2017-02-11 | Discharge: 2017-02-12 | Disposition: A | Discharge status: Home / Self Care | Payer: Medicaid Other | Attending: Emergency Medicine | Admitting: Emergency Medicine

## 2017-02-11 DIAGNOSIS — I110 Hypertensive heart disease with heart failure: Secondary | ICD-10-CM | POA: Insufficient documentation

## 2017-02-11 DIAGNOSIS — R0602 Shortness of breath: Secondary | ICD-10-CM | POA: Diagnosis not present

## 2017-02-11 DIAGNOSIS — Z794 Long term (current) use of insulin: Secondary | ICD-10-CM | POA: Diagnosis not present

## 2017-02-11 DIAGNOSIS — I509 Heart failure, unspecified: Secondary | ICD-10-CM | POA: Diagnosis not present

## 2017-02-11 DIAGNOSIS — E119 Type 2 diabetes mellitus without complications: Secondary | ICD-10-CM | POA: Insufficient documentation

## 2017-02-11 DIAGNOSIS — Z87891 Personal history of nicotine dependence: Secondary | ICD-10-CM | POA: Diagnosis not present

## 2017-02-11 DIAGNOSIS — Z7982 Long term (current) use of aspirin: Secondary | ICD-10-CM | POA: Diagnosis not present

## 2017-02-11 LAB — CBC WITH DIFFERENTIAL/PLATELET
Basophils Absolute: 0 10*3/uL (ref 0.0–0.1)
Basophils Relative: 0 %
Eosinophils Absolute: 0.2 10*3/uL (ref 0.0–0.7)
Eosinophils Relative: 2 %
HCT: 32.2 % — ABNORMAL LOW (ref 36.0–46.0)
Hemoglobin: 10.5 g/dL — ABNORMAL LOW (ref 12.0–15.0)
Lymphocytes Relative: 31 %
Lymphs Abs: 2.7 10*3/uL (ref 0.7–4.0)
MCH: 26.5 pg (ref 26.0–34.0)
MCHC: 32.6 g/dL (ref 30.0–36.0)
MCV: 81.3 fL (ref 78.0–100.0)
Monocytes Absolute: 0.9 10*3/uL (ref 0.1–1.0)
Monocytes Relative: 11 %
Neutro Abs: 4.9 10*3/uL (ref 1.7–7.7)
Neutrophils Relative %: 56 %
Platelets: 269 10*3/uL (ref 150–400)
RBC: 3.96 MIL/uL (ref 3.87–5.11)
RDW: 15.4 % (ref 11.5–15.5)
WBC: 8.7 10*3/uL (ref 4.0–10.5)

## 2017-02-11 LAB — I-STAT TROPONIN, ED: Troponin i, poc: 0.01 ng/mL (ref 0.00–0.08)

## 2017-02-11 NOTE — ED Provider Notes (Signed)
[facility] Provider Note: Lowella Dell, MD, FACEP  CSN: [id number] MRN: [id number] ARRIVAL: 02/11/17 at 2323 ROOM: [facility]  By signing my name below, I, Teofilo Pod, attest that this documentation has been prepared under the direction and in the presence of Paula Libra, MD . Electronically Signed: Teofilo Pod, ED Scribe. 02/11/2017. 11:44 PM.   CHIEF COMPLAINT  Shortness of Breath   HISTORY OF PRESENT ILLNESS  Alicia Cook is a 51 y.o. female with complaint of constant SOB x 2 days. She states that the SOB worsens when walking. Pt describes the symptoms as moderate, and she refused a wheelchair in the ED, preferring to walk. She reports hx of CHF and sarcoidosis. Pt notes hx of chronic diarrhea that she associates with taking Metformin, but she notes no acute changes. She has been taking her Lasix as prescribed but is concerned she may be in acute pulmonary edema. Denies chest pain, fever, cough, leg swelling, nausea, vomiting.    Past Medical History:  Diagnosis Date  . CHF (congestive heart failure) (HCC)   . Depression   . Diabetes mellitus without complication (HCC)   . Hypertension   . Nonischemic cardiomyopathy (HCC)    a. EF 35-40% by cath in 2011 with normal cors b. s/p ICD implantation in 12/2014 c. EF 25% by NST in 02/01/2016  . Sarcoidosis   . Sarcoidosis 02/08/2016    Past Surgical History:  Procedure Laterality Date  . ABDOMINAL SURGERY    . CARDIAC CATHETERIZATION N/A 02/08/2016   Procedure: Left Heart Cath and Coronary Angiography;  Surgeon: Corky Crafts, MD;  Location: [facility] INVASIVE CV LAB;  Service: Cardiovascular;  Laterality: N/A;  . CESAREAN SECTION    . CHOLECYSTECTOMY    . INSERTION OF ICD     a. s/p dual-chamber [employer] ICD 12/2014  . TUBAL LIGATION      Family History  Problem Relation Age of Onset  . Heart attack Father     a. Initial onset in his 43's. S/p CABG  . Heart failure Father     Social  History  Substance Use Topics  . Smoking status: Former Smoker    Packs/day: 1.00    Years: 20.00    Types: Cigarettes  . Smokeless tobacco: Not on file  . Alcohol use No    Prior to Admission medications   Medication Sig Start Date End Date Taking? Authorizing Provider  aspirin 81 MG chewable tablet Chew 81 mg by mouth daily.    Historical Provider, MD  carvedilol (COREG) 6.25 MG tablet Take 1 tablet (6.25 mg total) by mouth 2 (two) times daily with a meal. 02/09/16   Ellsworth Lennox, PA-C  colchicine 0.6 MG tablet Take 1 tablet (0.6 mg total) by mouth daily. 02/09/16   Ellsworth Lennox, PA-C  cyclobenzaprine (FLEXERIL) 10 MG tablet Take 10 mg by mouth 2 (two) times daily as needed for muscle spasms.    Historical Provider, MD  FLUoxetine (PROZAC) 40 MG capsule Take 40 mg by mouth daily.    Historical Provider, MD  fluticasone (FLONASE) 50 MCG/ACT nasal spray Place 1 spray into both nostrils daily. Reported on 02/09/2016    Historical Provider, MD  folic acid (FOLVITE) 1 MG tablet Take 1 mg by mouth daily.    Historical Provider, MD  furosemide (LASIX) 20 MG tablet Take 40 mg by mouth daily.    Historical Provider, MD  HYDROcodone-acetaminophen (NORCO/VICODIN) 5-325 MG tablet Take 1-2 tablets by mouth every  6 (six) hours as needed for moderate pain. 08/13/15   Vanetta Mulders, MD  hydroxychloroquine (PLAQUENIL) 200 MG tablet Take 400 mg by mouth daily.    Historical Provider, MD  ibuprofen (ADVIL,MOTRIN) 600 MG tablet Take 1 tablet (600mg ) by mouth 3 times daily for 7 days then stop. 02/09/16   Ellsworth Lennox, PA-C  insulin glargine (LANTUS) 100 UNIT/ML injection Inject 28 Units into the skin at bedtime.    Historical Provider, MD  insulin lispro (HUMALOG) 100 UNIT/ML injection Inject 1-10 Units into the skin 3 (three) times daily before meals.    Historical Provider, MD  lisinopril (PRINIVIL,ZESTRIL) 10 MG tablet Take 1 tablet (10 mg total) by mouth daily. 02/09/16   Ellsworth Lennox, PA-C  metFORMIN (GLUCOPHAGE) 500 MG tablet Take 2,000 mg by mouth daily with breakfast.    Historical Provider, MD  methotrexate 2.5 MG tablet Take 15 mg by mouth once a week.     Historical Provider, MD  ranitidine (ZANTAC) 150 MG tablet Take 150 mg by mouth 2 (two) times daily.    Historical Provider, MD  sitaGLIPtin (JANUVIA) 100 MG tablet Take 100 mg by mouth daily.    Historical Provider, MD  spironolactone (ALDACTONE) 25 MG tablet Take 25 mg by mouth daily.    Historical Provider, MD  traZODone (DESYREL) 150 MG tablet Take 150 mg by mouth at bedtime.    Historical Provider, MD    Allergies Patient has no known allergies.   REVIEW OF SYSTEMS  Negative except as noted here or in the History of Present Illness.   PHYSICAL EXAMINATION  Initial Vital Signs Blood pressure (!) 152/99, pulse (!) 106, temperature 98.2 F (36.8 C), temperature source Oral, resp. rate 20, height 5\' 7"  (1.702 m), weight (!) 303 lb (137.4 kg), last menstrual period 04/17/2016, SpO2 100 %.  Examination General: Well-developed, well-nourished female in no acute distress; appearance consistent with age of record HENT: normocephalic; atraumatic Eyes: pupils equal, round and reactive to light; extraocular muscles intact Neck: supple Heart: regular rate and rhythm Lungs: Mildly decreased air movement bilaterally, mild tachypnea  Abdomen: soft; nondistended; nontender; no masses or hepatosplenomegaly; bowel sounds present Extremities: No deformity; full range of motion; pulses normal Neurologic: Awake, alert and oriented; motor function intact in all extremities and symmetric; no facial droop Skin: Warm and dry Psychiatric: Normal mood and affect   RESULTS  Summary of this visit's results, reviewed by myself:   EKG Interpretation  Date/Time:  Sunday February 11 2017 23:31:18 EDT Ventricular Rate:  105 PR Interval:    QRS Duration: 152 QT Interval:  430 QTC Calculation: 569 R Axis:   -71 Text  Interpretation:  Sinus tachycardia Previously Paced No significant change was found when compared to unpaced rhythm Confirmed by Elantra Caprara  MD, Jonny Ruiz ([id number]) on 02/11/2017 11:35:36 PM      Laboratory Studies: Results for orders placed or performed during the hospital encounter of 02/11/17 (from the past 24 hour(s))  Brain natriuretic peptide     Status: None   Collection Time: 02/11/17 11:34 PM  Result Value Ref Range   B Natriuretic Peptide 81.7 0.0 - 100.0 pg/mL  CBC with Differential     Status: Abnormal   Collection Time: 02/11/17 11:34 PM  Result Value Ref Range   WBC 8.7 4.0 - 10.5 K/uL   RBC 3.96 3.87 - 5.11 MIL/uL   Hemoglobin 10.5 (L) 12.0 - 15.0 g/dL   HCT 30.8 (L) 65.7 - 84.6 %   MCV 81.3  78.0 - 100.0 fL   MCH 26.5 26.0 - 34.0 pg   MCHC 32.6 30.0 - 36.0 g/dL   RDW 16.1 09.6 - 04.5 %   Platelets 269 150 - 400 K/uL   Neutrophils Relative % 56 %   Neutro Abs 4.9 1.7 - 7.7 K/uL   Lymphocytes Relative 31 %   Lymphs Abs 2.7 0.7 - 4.0 K/uL   Monocytes Relative 11 %   Monocytes Absolute 0.9 0.1 - 1.0 K/uL   Eosinophils Relative 2 %   Eosinophils Absolute 0.2 0.0 - 0.7 K/uL   Basophils Relative 0 %   Basophils Absolute 0.0 0.0 - 0.1 K/uL  Comprehensive metabolic panel     Status: Abnormal   Collection Time: 02/11/17 11:34 PM  Result Value Ref Range   Sodium 139 135 - 145 mmol/L   Potassium 3.5 3.5 - 5.1 mmol/L   Chloride 105 101 - 111 mmol/L   CO2 26 22 - 32 mmol/L   Glucose, Bld 182 (H) 65 - 99 mg/dL   BUN 8 6 - 20 mg/dL   Creatinine, Ser 4.09 0.44 - 1.00 mg/dL   Calcium 9.2 8.9 - 81.1 mg/dL   Total Protein 7.1 6.5 - 8.1 g/dL   Albumin 3.7 3.5 - 5.0 g/dL   AST 28 15 - 41 U/L   ALT 22 14 - 54 U/L   Alkaline Phosphatase 91 38 - 126 U/L   Total Bilirubin 0.2 (L) 0.3 - 1.2 mg/dL   GFR calc non Af Amer >60 >60 mL/min   GFR calc Af Amer >60 >60 mL/min   Anion gap 8 5 - 15  I-stat troponin, ED     Status: None   Collection Time: 02/11/17 11:45 PM  Result Value Ref Range     Troponin i, poc 0.01 0.00 - 0.08 ng/mL   Comment 3           Imaging Studies: Dg Chest 2 View  Result Date: 02/12/2017 CLINICAL DATA:  Shortness of breath EXAM: CHEST  2 VIEW COMPARISON:  12/11/2016 FINDINGS: Left-sided pacing device as before. Mild cardiomegaly with central vascular congestion. No focal consolidation. No large effusion. No pneumothorax. IMPRESSION: Cardiomegaly with mild central congestion. Electronically Signed   By: Jasmine Pang M.D.   On: 02/12/2017 00:38    ED COURSE  Nursing notes and initial vitals signs, including pulse oximetry, reviewed.  Vitals:   02/11/17 2331 02/11/17 2332  BP: (!) 152/99   Pulse: (!) 106   Resp: 20   Temp: 98.2 F (36.8 C)   TempSrc: Oral   SpO2: 100%   Weight:  (!) 303 lb (137.4 kg)  Height:  5\' 7"  (1.702 m)   1:22 AM Patient feels better with improved air movement after DuoNeb treatment. I suspect this represents an exacerbation of her sarcoidosis. She has not previously been treated with bronchodilators for this. Chest x-ray and BNP are not consistent with acute pulmonary edema.  PROCEDURES    ED DIAGNOSES     ICD-9-CM ICD-10-CM   1. Shortness of breath 786.05 R06.02     I personally performed the services described in this documentation, which was scribed in my presence. The recorded information has been reviewed and is accurate.     Paula Libra, MD 02/12/17 [phone]

## 2017-02-11 NOTE — ED Triage Notes (Signed)
Pt reports that she has CHF.  Reports that her SOB started yesterday and became worse today.  Pt refused wheelchair and was ambulatory without distress, talking in complete sentences.

## 2017-02-11 NOTE — ED Notes (Signed)
ED Provider at bedside. 

## 2017-02-12 LAB — COMPREHENSIVE METABOLIC PANEL
ALT: 22 U/L (ref 14–54)
AST: 28 U/L (ref 15–41)
Albumin: 3.7 g/dL (ref 3.5–5.0)
Alkaline Phosphatase: 91 U/L (ref 38–126)
Anion gap: 8 (ref 5–15)
BUN: 8 mg/dL (ref 6–20)
CO2: 26 mmol/L (ref 22–32)
Calcium: 9.2 mg/dL (ref 8.9–10.3)
Chloride: 105 mmol/L (ref 101–111)
Creatinine, Ser: 0.69 mg/dL (ref 0.44–1.00)
GFR calc Af Amer: >60 mL/min (ref >60)
GFR calc non Af Amer: >60 mL/min (ref >60)
Glucose, Bld: 182 mg/dL — ABNORMAL HIGH (ref 65–99)
Potassium: 3.5 mmol/L (ref 3.5–5.1)
Sodium: 139 mmol/L (ref 135–145)
Total Bilirubin: 0.2 mg/dL — ABNORMAL LOW (ref 0.3–1.2)
Total Protein: 7.1 g/dL (ref 6.5–8.1)

## 2017-02-12 LAB — BRAIN NATRIURETIC PEPTIDE: B Natriuretic Peptide: 81.7 pg/mL (ref 0.0–100.0)

## 2017-02-12 MED ORDER — IPRATROPIUM-ALBUTEROL 0.5-2.5 (3) MG/3ML IN SOLN
3.0000 mL | RESPIRATORY_TRACT | Status: DC
  Administered 2017-02-12: 3 mL via RESPIRATORY_TRACT
  Filled 2017-02-12: qty 3

## 2017-02-12 MED ORDER — ALBUTEROL SULFATE HFA 108 (90 BASE) MCG/ACT IN AERS
2.0000 | INHALATION_SPRAY | RESPIRATORY_TRACT | Status: DC | PRN
  Administered 2017-02-12: 2 via RESPIRATORY_TRACT
  Filled 2017-02-12: qty 6.7

## 2017-07-18 ENCOUNTER — Encounter (HOSPITAL_BASED_OUTPATIENT_CLINIC_OR_DEPARTMENT_OTHER): Payer: Self-pay | Admitting: Respiratory Therapy

## 2017-07-18 ENCOUNTER — Emergency Department (HOSPITAL_BASED_OUTPATIENT_CLINIC_OR_DEPARTMENT_OTHER): Payer: Medicaid Other

## 2017-07-18 ENCOUNTER — Emergency Department (HOSPITAL_BASED_OUTPATIENT_CLINIC_OR_DEPARTMENT_OTHER)
Admission: EM | Admit: 2017-07-18 | Discharge: 2017-07-19 | Disposition: A | Discharge status: Home / Self Care | Payer: Medicaid Other | Attending: Emergency Medicine | Admitting: Emergency Medicine

## 2017-07-18 DIAGNOSIS — Z7982 Long term (current) use of aspirin: Secondary | ICD-10-CM | POA: Insufficient documentation

## 2017-07-18 DIAGNOSIS — Z95810 Presence of automatic (implantable) cardiac defibrillator: Secondary | ICD-10-CM | POA: Insufficient documentation

## 2017-07-18 DIAGNOSIS — I110 Hypertensive heart disease with heart failure: Secondary | ICD-10-CM | POA: Diagnosis not present

## 2017-07-18 DIAGNOSIS — Z79899 Other long term (current) drug therapy: Secondary | ICD-10-CM | POA: Diagnosis not present

## 2017-07-18 DIAGNOSIS — Z87891 Personal history of nicotine dependence: Secondary | ICD-10-CM | POA: Insufficient documentation

## 2017-07-18 DIAGNOSIS — R002 Palpitations: Secondary | ICD-10-CM | POA: Insufficient documentation

## 2017-07-18 DIAGNOSIS — I5022 Chronic systolic (congestive) heart failure: Secondary | ICD-10-CM | POA: Insufficient documentation

## 2017-07-18 DIAGNOSIS — E119 Type 2 diabetes mellitus without complications: Secondary | ICD-10-CM | POA: Insufficient documentation

## 2017-07-18 DIAGNOSIS — Z794 Long term (current) use of insulin: Secondary | ICD-10-CM | POA: Diagnosis not present

## 2017-07-18 DIAGNOSIS — R51 Headache: Secondary | ICD-10-CM | POA: Diagnosis not present

## 2017-07-18 DIAGNOSIS — E876 Hypokalemia: Secondary | ICD-10-CM

## 2017-07-18 LAB — BASIC METABOLIC PANEL
Anion gap: 9 (ref 5–15)
BUN: 12 mg/dL (ref 6–20)
CO2: 26 mmol/L (ref 22–32)
Calcium: 9.2 mg/dL (ref 8.9–10.3)
Chloride: 106 mmol/L (ref 101–111)
Creatinine, Ser: 0.87 mg/dL (ref 0.44–1.00)
GFR calc Af Amer: >60 mL/min (ref >60)
GFR calc non Af Amer: >60 mL/min (ref >60)
Glucose, Bld: 133 mg/dL — ABNORMAL HIGH (ref 65–99)
Potassium: 3.1 mmol/L — ABNORMAL LOW (ref 3.5–5.1)
Sodium: 141 mmol/L (ref 135–145)

## 2017-07-18 LAB — CBC
HCT: 34.9 % — ABNORMAL LOW (ref 36.0–46.0)
Hemoglobin: 11.3 g/dL — ABNORMAL LOW (ref 12.0–15.0)
MCH: 26.2 pg (ref 26.0–34.0)
MCHC: 32.4 g/dL (ref 30.0–36.0)
MCV: 80.8 fL (ref 78.0–100.0)
Platelets: 208 10*3/uL (ref 150–400)
RBC: 4.32 MIL/uL (ref 3.87–5.11)
RDW: 17.2 % — ABNORMAL HIGH (ref 11.5–15.5)
WBC: 9.3 10*3/uL (ref 4.0–10.5)

## 2017-07-18 LAB — TROPONIN I: Troponin I: <0.03 ng/mL (ref <0.03)

## 2017-07-18 MED ORDER — MAGNESIUM SULFATE IN D5W 1-5 GM/100ML-% IV SOLN
INTRAVENOUS | Status: AC
  Filled 2017-07-18: qty 100

## 2017-07-18 MED ORDER — MAGNESIUM SULFATE 50 % IJ SOLN: 1.0000 g | Freq: Once | INTRAMUSCULAR | Status: DC

## 2017-07-18 MED ORDER — POTASSIUM CHLORIDE CRYS ER 20 MEQ PO TBCR
20.0000 meq | EXTENDED_RELEASE_TABLET | Freq: Once | ORAL | Status: AC
  Administered 2017-07-18: 20 meq via ORAL
  Filled 2017-07-18: qty 1

## 2017-07-18 MED ORDER — MAGNESIUM SULFATE IN D5W 1-5 GM/100ML-% IV SOLN
1.0000 g | Freq: Once | INTRAVENOUS | Status: AC
  Administered 2017-07-18: 1 g via INTRAVENOUS

## 2017-07-18 MED ORDER — ACETAMINOPHEN 325 MG PO TABS
650.0000 mg | ORAL_TABLET | Freq: Once | ORAL | Status: AC
  Administered 2017-07-18: 650 mg via ORAL
  Filled 2017-07-18: qty 2

## 2017-07-18 NOTE — ED Triage Notes (Signed)
Pt c/o palpations, chest pressure and headache.

## 2017-07-19 NOTE — ED Provider Notes (Signed)
[facility] Provider [facility]   CSN: [id number] Arrival date & time: 07/18/17  1952     History   Chief Complaint Chief Complaint  Patient presents with  . Palpitations    HPI Alicia Cook is a 51 y.o. female presenting with sudden onset palpitations yesterday while sitting at her desk at work it has been intermittent since and she reports that it usually happens if her potassium is low. It hadn't happened in the long time but she recognized the symptoms. Since it continued to occur intermittently today, she decided to come in and be evaluated for her potassium. She states that she forgot to take her supplements for the last 4 days. She took 20 mg just before coming to the emergency department. She denies any chest pain, shortness of breath, nausea, vomiting, diaphoresis, lower extremity edema or any other symptoms. She reports that she otherwise feels well. She currently reports a slight pressure headache that started today.  HPI  Past Medical History:  Diagnosis Date  . CHF (congestive heart failure) (HCC)   . Depression   . Diabetes mellitus without complication (HCC)   . Hypertension   . Nonischemic cardiomyopathy (HCC)    a. EF 35-40% by cath in 2011 with normal cors b. s/p ICD implantation in 12/2014 c. EF 25% by NST in 02/01/2016  . Sarcoidosis   . Sarcoidosis 02/08/2016    Patient Active Problem List   Diagnosis Date Noted  . Chest pain 02/08/2016  . Sarcoidosis 02/08/2016  . Benign essential HTN 02/08/2016  . DCM (dilated cardiomyopathy) (HCC) 02/08/2016  . DM (diabetes mellitus), type 2 (HCC) 02/08/2016  . Chronic systolic CHF (congestive heart failure) (HCC) 02/08/2016    Past Surgical History:  Procedure Laterality Date  . ABDOMINAL SURGERY    . CARDIAC CATHETERIZATION N/A 02/08/2016   Procedure: Left Heart Cath and Coronary Angiography;  Surgeon: Corky Crafts, MD;  Location: [facility] INVASIVE CV LAB;  Service: Cardiovascular;  Laterality: N/A;  .  CESAREAN SECTION    . CHOLECYSTECTOMY    . INSERTION OF ICD     a. s/p dual-chamber [employer] ICD 12/2014  . TUBAL LIGATION      OB History    No data available       Home Medications    Prior to Admission medications   Medication Sig Start Date End Date Taking? Authorizing Provider  aspirin 81 MG chewable tablet Chew 81 mg by mouth daily.    [provider]  carvedilol (COREG) 6.25 MG tablet Take 1 tablet (6.25 mg total) by mouth 2 (two) times daily with a meal. 02/09/16   Strader, [place] M, PA-C  colchicine 0.6 MG tablet Take 1 tablet (0.6 mg total) by mouth daily. 02/09/16   Strader, Lennart Pall, PA-C  cyclobenzaprine (FLEXERIL) 10 MG tablet Take 10 mg by mouth 2 (two) times daily as needed for muscle spasms.    [provider]  FLUoxetine (PROZAC) 40 MG capsule Take 40 mg by mouth daily.    [provider]  fluticasone (FLONASE) 50 MCG/ACT nasal spray Place 1 spray into both nostrils daily. Reported on 02/09/2016    [provider]  folic acid (FOLVITE) 1 MG tablet Take 1 mg by mouth daily.    [provider]  furosemide (LASIX) 20 MG tablet Take 40 mg by mouth daily.    [provider]  HYDROcodone-acetaminophen (NORCO/VICODIN) 5-325 MG tablet Take 1-2 tablets by mouth every 6 (six) hours as needed for moderate  pain. 08/13/15   Vanetta Mulders, MD  hydroxychloroquine (PLAQUENIL) 200 MG tablet Take 400 mg by mouth daily.    [provider]  ibuprofen (ADVIL,MOTRIN) 600 MG tablet Take 1 tablet ( ) by mouth 3 times daily for 7 days then stop. 02/09/16   Strader, Lennart Pall, PA-C  insulin glargine (LANTUS) 100 UNIT/ML injection Inject 28 Units into the skin at bedtime.    [provider]  insulin lispro (HUMALOG) 100 UNIT/ML injection Inject 1-10 Units into the skin 3 (three) times daily before meals.    [provider]  lisinopril (PRINIVIL,ZESTRIL) 10 MG tablet Take 1 tablet (10 mg total) by  mouth daily. 02/09/16   Strader, Lennart Pall, PA-C  metFORMIN (GLUCOPHAGE) 500 MG tablet Take 2,000 mg by mouth daily with breakfast.    [provider]  methotrexate 2.5 MG tablet Take 15 mg by mouth once a week.     [provider]  ranitidine (ZANTAC) 150 MG tablet Take 150 mg by mouth 2 (two) times daily.    [provider]  sitaGLIPtin (JANUVIA) 100 MG tablet Take 100 mg by mouth daily.    [provider]  spironolactone (ALDACTONE) 25 MG tablet Take 25 mg by mouth daily.    [provider]  traZODone (DESYREL) 150 MG tablet Take 150 mg by mouth at bedtime.    [provider]    Family History Family History  Problem Relation Age of Onset  . Heart attack Father        a. Initial onset in his 102's. S/p CABG  . Heart failure Father     Social History Social History  Substance Use Topics  . Smoking status: Former Smoker    Packs/day: 1.00    Years: 20.00    Types: Cigarettes  . Smokeless tobacco: Never Used  . Alcohol use No     Allergies   Patient has no known allergies.   Review of Systems Review of Systems  Constitutional: Negative for chills, diaphoresis and fever.  HENT: Negative for tinnitus and trouble swallowing.   Eyes: Negative for photophobia, pain, redness and visual disturbance.  Respiratory: Negative for cough, choking, chest tightness, shortness of breath, wheezing and stridor.   Cardiovascular: Positive for palpitations. Negative for chest pain and leg swelling.  Gastrointestinal: Negative for abdominal distention, abdominal pain, nausea and vomiting.  Genitourinary: Negative for difficulty urinating, dysuria, flank pain and hematuria.  Musculoskeletal: Negative for arthralgias, back pain, myalgias, neck pain and neck stiffness.  Skin: Negative for color change, pallor and rash.  Neurological: Positive for headaches. Negative for dizziness, tremors, seizures, syncope, facial asymmetry, speech difficulty,  weakness, light-headedness and numbness.     Physical Exam Updated Vital Signs BP (!) 111/50 (BP Location: Left Arm)   Pulse 85   Temp 98.3 F (36.8 C) (Oral)   Resp 20   Ht  (1.702 m)   Wt 128.8 kg (284 lb)   LMP 04/17/2016   SpO2 97%   BMI 44.48 kg/m   Physical Exam  Constitutional: She is oriented to person, place, and time. She appears well-developed and well-nourished. No distress.  She is afebrile, nontoxic-appearing, lying comfortably in bed in no acute distress.  HENT:  Head: Normocephalic and atraumatic.  Mouth/Throat: Oropharynx is clear and moist. No oropharyngeal exudate.  Eyes: Pupils are equal, round, and reactive to light. Conjunctivae and EOM are normal. Right eye exhibits no discharge. Left eye exhibits no discharge.  Neck: Normal range of motion. Neck supple.  Cardiovascular: Normal rate, regular rhythm, normal heart sounds and intact distal pulses.   No murmur heard. Pulmonary/Chest: Effort normal and breath sounds normal. No respiratory distress. She has no wheezes. She has no rales. She exhibits no tenderness.  Abdominal: Soft. She exhibits no distension. There is no tenderness.  Musculoskeletal: Normal range of motion. She exhibits no edema, tenderness or deformity.  Lower extremity edema or signs of fluid overload.  Neurological: She is alert and oriented to person, place, and time. No cranial nerve deficit or sensory deficit. She exhibits normal muscle tone.  5 out of 5 strength all extremities bilaterally  Skin: Skin is warm and dry. No rash noted. She is not diaphoretic. No erythema. No pallor.  Psychiatric: She has a normal mood and affect.  Nursing note and vitals reviewed.    ED Treatments / Results  Labs (all labs ordered are listed, but only abnormal results are displayed) Labs Reviewed  BASIC METABOLIC PANEL - Abnormal; Notable for the following:       Result Value   Potassium 3.1 (*)    Glucose, Bld 133 (*)    All other components  within normal limits  CBC - Abnormal; Notable for the following:    Hemoglobin 11.3 (*)    HCT 34.9 (*)    RDW 17.2 (*)    All other components within normal limits  TROPONIN I    EKG  EKG Interpretation  Date/Time:  Wednesday July 18 2017 19:59:31 EDT Ventricular Rate:  79 PR Interval:    QRS Duration: 144 QT Interval:  468 QTC Calculation: 536 R Axis:   -62 Text Interpretation:  Ventricular-paced rhythm Abnormal ECG Confirmed by Rolland Porter ([id number]) on 07/18/2017 8:02:24 PM       Radiology Dg Chest 2 View  Result Date: 07/18/2017 CLINICAL DATA:  Lethargy for several days. Palpitations. Upper chest pain with pressure. Cough. EXAM: CHEST  2 VIEW COMPARISON:  02/11/2017 FINDINGS: Cardiac pacemaker. Normal heart size and pulmonary vascularity. No focal airspace disease or consolidation in the lungs. No blunting of costophrenic angles. No pneumothorax. Mediastinal contours appear intact. IMPRESSION: No active cardiopulmonary disease. Electronically Signed   By: Burman Nieves M.D.   On: 07/18/2017 22:52    Procedures Procedures (including critical care time)  Medications Ordered in ED Medications  magnesium sulfate (IV Push/IM) injection 1 g (not administered)  potassium chloride SA (K-DUR,KLOR-CON) CR tablet 20 mEq (20 mEq Oral Given 07/18/17 2349)  acetaminophen (TYLENOL) tablet 650 mg (650 mg Oral Given 07/18/17 2349)  magnesium sulfate IVPB 1 g 100 mL (1 g Intravenous New Bag/Given 07/18/17 2349)     Initial Impression / Assessment and Plan / ED Course  I have reviewed the triage vital signs and the nursing notes.  Pertinent labs & imaging results that were available during my care of the patient were reviewed by me and considered in my medical decision making (see chart for details).     Patient presenting with intermittent episodes of palpitations and concerns of hypokalemia. States that she recognized the symptom she has had this in the past usually due to her  potassium. EKG with ventricular paced rhythm Left sided pacemaker.  No signs of fluid overload, no chest pain, shortness of breath. She currently is asymptomatic except for a slight pressure headache which resolved after she was given tylenol. Normal neuro   Labs with evidence of slight hypokalemia, negative troponin and workup otherwise unremarkable. Patient was given potassium and magnesium while in the emergency department.  She is well-appearing and asymptomatic and significantly improved while in the emergency department.  Discharge home with close PCP follow-up and advised patient to resume her potassium supplements at home.  Discussed strict return precautions and advised to return to the emergency department if experiencing any new or worsening symptoms. Instructions were understood and patient agreed with discharge plan.  Final Clinical Impressions(s) / ED Diagnoses   Final diagnoses:  Palpitations  Hypokalemia    New Prescriptions New Prescriptions   No medications on file     Gregary Cromer 07/19/17 0107    Rolland Porter, MD 07/31/17 2029

## 2017-07-19 NOTE — Discharge Instructions (Signed)
As discussed, make sure that you take your potassium supplements as prescribed at home. Follow-up with your primary care provider.  Return if you experience any palpitations, chest pain, shortness of breath, nausea, vomiting or any other new concerning symptoms in the meantime.

## 2017-07-19 NOTE — ED Notes (Signed)
Pt discharged to home with family. NAD.  

## 2017-07-29 ENCOUNTER — Emergency Department (HOSPITAL_BASED_OUTPATIENT_CLINIC_OR_DEPARTMENT_OTHER): Payer: Medicaid Other

## 2017-07-29 ENCOUNTER — Emergency Department (HOSPITAL_BASED_OUTPATIENT_CLINIC_OR_DEPARTMENT_OTHER)
Admission: EM | Admit: 2017-07-29 | Discharge: 2017-07-29 | Disposition: A | Discharge status: Home / Self Care | Payer: Medicaid Other | Attending: Emergency Medicine | Admitting: Emergency Medicine

## 2017-07-29 ENCOUNTER — Encounter (HOSPITAL_BASED_OUTPATIENT_CLINIC_OR_DEPARTMENT_OTHER): Payer: Self-pay | Admitting: Emergency Medicine

## 2017-07-29 DIAGNOSIS — R51 Headache: Secondary | ICD-10-CM | POA: Diagnosis not present

## 2017-07-29 DIAGNOSIS — I5022 Chronic systolic (congestive) heart failure: Secondary | ICD-10-CM | POA: Diagnosis not present

## 2017-07-29 DIAGNOSIS — Z7982 Long term (current) use of aspirin: Secondary | ICD-10-CM | POA: Insufficient documentation

## 2017-07-29 DIAGNOSIS — I110 Hypertensive heart disease with heart failure: Secondary | ICD-10-CM | POA: Diagnosis not present

## 2017-07-29 DIAGNOSIS — Z794 Long term (current) use of insulin: Secondary | ICD-10-CM | POA: Insufficient documentation

## 2017-07-29 DIAGNOSIS — R1011 Right upper quadrant pain: Secondary | ICD-10-CM | POA: Insufficient documentation

## 2017-07-29 DIAGNOSIS — J3489 Other specified disorders of nose and nasal sinuses: Secondary | ICD-10-CM | POA: Diagnosis not present

## 2017-07-29 DIAGNOSIS — E119 Type 2 diabetes mellitus without complications: Secondary | ICD-10-CM | POA: Insufficient documentation

## 2017-07-29 DIAGNOSIS — Z79899 Other long term (current) drug therapy: Secondary | ICD-10-CM | POA: Insufficient documentation

## 2017-07-29 DIAGNOSIS — Z87891 Personal history of nicotine dependence: Secondary | ICD-10-CM | POA: Diagnosis not present

## 2017-07-29 LAB — URINALYSIS, ROUTINE W REFLEX MICROSCOPIC
Bilirubin Urine: NEGATIVE
Glucose, UA: >=500 mg/dL — AB
Hgb urine dipstick: NEGATIVE
Ketones, ur: NEGATIVE mg/dL
Leukocytes, UA: NEGATIVE
Nitrite: NEGATIVE
Protein, ur: NEGATIVE mg/dL
Specific Gravity, Urine: 1.01 (ref 1.005–1.030)
pH: 6 (ref 5.0–8.0)

## 2017-07-29 LAB — URINALYSIS, MICROSCOPIC (REFLEX)
Bacteria, UA: NONE SEEN
Squamous Epithelial / LPF: NONE SEEN

## 2017-07-29 LAB — CBC WITH DIFFERENTIAL/PLATELET
Basophils Absolute: 0 10*3/uL (ref 0.0–0.1)
Basophils Relative: 0 %
Eosinophils Absolute: 0.2 10*3/uL (ref 0.0–0.7)
Eosinophils Relative: 2 %
HCT: 34.7 % — ABNORMAL LOW (ref 36.0–46.0)
Hemoglobin: 11 g/dL — ABNORMAL LOW (ref 12.0–15.0)
Lymphocytes Relative: 28 %
Lymphs Abs: 2.5 10*3/uL (ref 0.7–4.0)
MCH: 26.1 pg (ref 26.0–34.0)
MCHC: 31.7 g/dL (ref 30.0–36.0)
MCV: 82.2 fL (ref 78.0–100.0)
Monocytes Absolute: 0.9 10*3/uL (ref 0.1–1.0)
Monocytes Relative: 10 %
Neutro Abs: 5.2 10*3/uL (ref 1.7–7.7)
Neutrophils Relative %: 60 %
Platelets: 224 10*3/uL (ref 150–400)
RBC: 4.22 MIL/uL (ref 3.87–5.11)
RDW: 17.2 % — ABNORMAL HIGH (ref 11.5–15.5)
WBC: 8.7 10*3/uL (ref 4.0–10.5)

## 2017-07-29 LAB — COMPREHENSIVE METABOLIC PANEL
ALT: 23 U/L (ref 14–54)
AST: 22 U/L (ref 15–41)
Albumin: 3.6 g/dL (ref 3.5–5.0)
Alkaline Phosphatase: 90 U/L (ref 38–126)
Anion gap: 8 (ref 5–15)
BUN: 11 mg/dL (ref 6–20)
CO2: 27 mmol/L (ref 22–32)
Calcium: 9.2 mg/dL (ref 8.9–10.3)
Chloride: 105 mmol/L (ref 101–111)
Creatinine, Ser: 0.87 mg/dL (ref 0.44–1.00)
GFR calc Af Amer: >60 mL/min (ref >60)
GFR calc non Af Amer: >60 mL/min (ref >60)
Glucose, Bld: 152 mg/dL — ABNORMAL HIGH (ref 65–99)
Potassium: 3.4 mmol/L — ABNORMAL LOW (ref 3.5–5.1)
Sodium: 140 mmol/L (ref 135–145)
Total Bilirubin: 0.2 mg/dL — ABNORMAL LOW (ref 0.3–1.2)
Total Protein: 6.8 g/dL (ref 6.5–8.1)

## 2017-07-29 LAB — LIPASE, BLOOD: Lipase: 41 U/L (ref 11–51)

## 2017-07-29 MED ORDER — DICYCLOMINE HCL 10 MG PO CAPS
10.0000 mg | ORAL_CAPSULE | Freq: Once | ORAL | Status: AC
  Administered 2017-07-29: 10 mg via ORAL
  Filled 2017-07-29: qty 1

## 2017-07-29 MED ORDER — POLYETHYLENE GLYCOL 3350 17 G PO PACK
17.0000 g | PACK | Freq: Every day | ORAL | 0 refills | Status: DC
Start: 2017-07-29 — End: 2019-03-06

## 2017-07-29 MED ORDER — DICYCLOMINE HCL 20 MG PO TABS: 20.0000 mg | ORAL_TABLET | Freq: Two times a day (BID) | ORAL | 0 refills | Status: DC | PRN

## 2017-07-29 MED ORDER — GI COCKTAIL ~~LOC~~
30.0000 mL | Freq: Once | ORAL | Status: AC
  Administered 2017-07-29: 30 mL via ORAL
  Filled 2017-07-29: qty 30

## 2017-07-29 NOTE — ED Provider Notes (Signed)
[facility]-EMERGENCY DEPT Provider Note   CSN: [id number] Arrival date & time: 07/29/17  0102     History   Chief Complaint Chief Complaint  Patient presents with  . Abdominal Pain  . Back Pain    HPI Alicia Cook is a 51 y.o. female.  HPI Patient presents with several days of right upper quadrant pain radiating to her right flank. States pain is worse with deep breathing. No nausea or vomiting. No dysuria, hematuria, frequency or urgency. Patient denies any vaginal bleeding or discharge. No shortness of breath or chest pain. Denies cough. No new lower extremity swelling or pain. Patient also complains of gradual onset bitemporal throbbing headache. No associated photophobia or nausea. She's been taking Tylenol at home with little improvement. States headache is similar to her normal headache. No visual changes, focal weakness or numbness. No neck pain or stiffness. Past Medical History:  Diagnosis Date  . CHF (congestive heart failure) (HCC)   . Depression   . Diabetes mellitus without complication (HCC)   . Hypertension   . Nonischemic cardiomyopathy (HCC)    a. EF 35-40% by cath in 2011 with normal cors b. s/p ICD implantation in 12/2014 c. EF 25% by NST in 02/01/2016  . Sarcoidosis   . Sarcoidosis 02/08/2016    Patient Active Problem List   Diagnosis Date Noted  . Chest pain 02/08/2016  . Sarcoidosis 02/08/2016  . Benign essential HTN 02/08/2016  . DCM (dilated cardiomyopathy) (HCC) 02/08/2016  . DM (diabetes mellitus), type 2 (HCC) 02/08/2016  . Chronic systolic CHF (congestive heart failure) (HCC) 02/08/2016    Past Surgical History:  Procedure Laterality Date  . ABDOMINAL SURGERY    . CARDIAC CATHETERIZATION N/A 02/08/2016   Procedure: Left Heart Cath and Coronary Angiography;  Surgeon: Corky Crafts, MD;  [facility]: [facility] INVASIVE CV LAB;  Service: Cardiovascular;  Laterality: N/A;  . CESAREAN SECTION    . CHOLECYSTECTOMY    . INSERTION OF ICD     a. s/p  dual-chamber [employer] ICD 12/2014  . TUBAL LIGATION      OB History    No data available       Home Medications    Prior to Admission medications   Medication Sig Start Date End Date Taking? Authorizing Provider  aspirin 81 MG chewable tablet Chew 81 mg by mouth daily.    [provider]  carvedilol (COREG) 6.25 MG tablet Take 1 tablet (6.25 mg total) by mouth 2 (two) times daily with a meal. 02/09/16   Strader, [place] M, PA-C  colchicine 0.6 MG tablet Take 1 tablet (0.6 mg total) by mouth daily. 02/09/16   Strader, Lennart Pall, PA-C  cyclobenzaprine (FLEXERIL) 10 MG tablet Take 10 mg by mouth 2 (two) times daily as needed for muscle spasms.    [provider]  dicyclomine (BENTYL) 20 MG tablet Take 1 tablet (20 mg total) by mouth 2 (two) times daily as needed for spasms. 07/29/17   Loren Racer, MD  FLUoxetine (PROZAC) 40 MG capsule Take 40 mg by mouth daily.    [provider]  fluticasone (FLONASE) 50 MCG/ACT nasal spray Place 1 spray into both nostrils daily. Reported on 02/09/2016    [provider]  folic acid (FOLVITE) 1 MG tablet Take 1 mg by mouth daily.    [provider]  furosemide (LASIX) 20 MG tablet Take 40 mg by mouth daily.    [provider]  HYDROcodone-acetaminophen (NORCO/VICODIN) 5-325 MG tablet Take 1-2 tablets by  mouth every 6 (six) hours as needed for moderate pain. 08/13/15   Vanetta Mulders, MD  hydroxychloroquine (PLAQUENIL) 200 MG tablet Take 400 mg by mouth daily.    [provider]  ibuprofen (ADVIL,MOTRIN) 600 MG tablet Take 1 tablet (600mg ) by mouth 3 times daily for 7 days then stop. 02/09/16   Strader, Lennart Pall, PA-C  insulin glargine (LANTUS) 100 UNIT/ML injection Inject 28 Units into the skin at bedtime.    [provider]  insulin lispro (HUMALOG) 100 UNIT/ML injection Inject 1-10 Units into the skin 3 (three) times daily before meals.    [provider]    lisinopril (PRINIVIL,ZESTRIL) 10 MG tablet Take 1 tablet (10 mg total) by mouth daily. 02/09/16   Strader, Lennart Pall, PA-C  metFORMIN (GLUCOPHAGE) 500 MG tablet Take 2,000 mg by mouth daily with breakfast.    [provider]  methotrexate 2.5 MG tablet Take 15 mg by mouth once a week.     [provider]  polyethylene glycol (MIRALAX / GLYCOLAX) packet Take 17 g by mouth daily. 07/29/17   Loren Racer, MD  ranitidine (ZANTAC) 150 MG tablet Take 150 mg by mouth 2 (two) times daily.    [provider]  sitaGLIPtin (JANUVIA) 100 MG tablet Take 100 mg by mouth daily.    [provider]  spironolactone (ALDACTONE) 25 MG tablet Take 25 mg by mouth daily.    [provider]  traZODone (DESYREL) 150 MG tablet Take 150 mg by mouth at bedtime.    [provider]    Family History Family History  Problem Relation Age of Onset  . Heart attack Father        a. Initial onset in his 23's. S/p CABG  . Heart failure Father     Social History Social History  Substance Use Topics  . Smoking status: Former Smoker    Packs/day: 1.00    Years: 20.00    Types: Cigarettes  . Smokeless tobacco: Never Used  . Alcohol use No     Allergies   Patient has no known allergies.   Review of Systems Review of Systems  Constitutional: Negative for chills and fever.  HENT: Positive for sinus pressure. Negative for congestion, sneezing and sore throat.   Eyes: Negative for photophobia and visual disturbance.  Respiratory: Negative for cough and shortness of breath.   Cardiovascular: Negative for chest pain, palpitations and leg swelling.  Gastrointestinal: Positive for abdominal pain. Negative for constipation, diarrhea, nausea and vomiting.  Genitourinary: Negative for dysuria, enuresis, frequency and hematuria.  Musculoskeletal: Positive for back pain. Negative for neck pain and neck stiffness.  Skin: Negative for rash and wound.  Neurological:  Negative for dizziness, weakness, light-headedness, numbness and headaches.  All other systems reviewed and are negative.    Physical Exam Updated Vital Signs BP 139/76   Pulse 66   Temp 98.4 F (36.9 C) (Oral)   Resp 18   LMP 04/17/2016   SpO2 100%   Physical Exam  Constitutional: She is oriented to person, place, and time. She appears well-developed and well-nourished. No distress.  HENT:  Head: Normocephalic and atraumatic.  Mouth/Throat: Oropharynx is clear and moist.  Mild bilateral frontal sinus tenderness to percussion.  Eyes: Pupils are equal, round, and reactive to light. EOM are normal.  Neck: Normal range of motion. Neck supple.  No meningismus  Cardiovascular: Normal rate and regular rhythm.   Pulmonary/Chest: Effort normal and breath sounds normal.  Abdominal: Soft. Bowel sounds  are normal. There is tenderness (mild right upper quadrant tenderness to palpation. There is no rebound or guarding.). There is no rebound and no guarding.  Musculoskeletal: Normal range of motion. She exhibits no edema or tenderness.  No CVA tenderness bilaterally. No midline thoracic or lumbar tenderness. No lower extremity swelling, asymmetry or tenderness. Distal pulses are 2+.  Lymphadenopathy:    She has no cervical adenopathy.  Neurological: She is alert and oriented to person, place, and time.  Moving all extremities without focal deficit. Sensation fully intact.  Skin: Skin is warm and dry. No rash noted. No erythema.  Psychiatric: She has a normal mood and affect. Her behavior is normal.  Nursing note and vitals reviewed.    ED Treatments / Results  Labs (all labs ordered are listed, but only abnormal results are displayed) Labs Reviewed  CBC WITH DIFFERENTIAL/PLATELET - Abnormal; Notable for the following:       Result Value   Hemoglobin 11.0 (*)    HCT 34.7 (*)    RDW 17.2 (*)    All other components within normal limits  COMPREHENSIVE METABOLIC PANEL - Abnormal;  Notable for the following:    Potassium 3.4 (*)    Glucose, Bld 152 (*)    Total Bilirubin 0.2 (*)    All other components within normal limits  URINALYSIS, ROUTINE W REFLEX MICROSCOPIC - Abnormal; Notable for the following:    Glucose, UA >=500 (*)    All other components within normal limits  LIPASE, BLOOD  URINALYSIS, MICROSCOPIC (REFLEX)    EKG  EKG Interpretation None       Radiology Dg Abd Acute W/chest  Result Date: 07/29/2017 CLINICAL DATA:  51 y/o F; upper abdominal pain. History of sarcoidosis. EXAM: DG ABDOMEN ACUTE W/ 1V CHEST COMPARISON:  07/18/2017 chest radiograph. FINDINGS: Three lead AICD. Stable cardiomegaly. Clear lungs. Mild reverse S curvature of the lumbar spine. Right upper quadrant cholecystectomy clips. Normal bowel gas pattern. No acute osseous abnormality identified. IMPRESSION: Negative abdominal radiographs. No acute cardiopulmonary disease. Stable cardiomegaly. Electronically Signed   By: Mitzi Hansen M.D.   On: 07/29/2017 04:42    Procedures Procedures (including critical care time)  Medications Ordered in ED Medications  gi cocktail (Maalox,Lidocaine,Donnatal) (30 mLs Oral Given 07/29/17 0202)  dicyclomine (BENTYL) capsule 10 mg (10 mg Oral Given 07/29/17 0425)     Initial Impression / Assessment and Plan / ED Course  I have reviewed the triage vital signs and the nursing notes.  Pertinent labs & imaging results that were available during my care of the patient were reviewed by me and considered in my medical decision making (see chart for details).    Workup is essentially negative. Mild fecal loading in the colon. Given migratory nature of pain, suspect this may be the origin. We'll start on Bentyl and MiraLAX. Patient will follow-up with gastroenterology should her symptoms persist. Return precautions given.   Final Clinical Impressions(s) / ED Diagnoses   Final diagnoses:  Right upper quadrant abdominal pain    New  Prescriptions Discharge Medication List as of 07/29/2017  5:21 AM    START taking these medications   Details  dicyclomine (BENTYL) 20 MG tablet Take 1 tablet (20 mg total) by mouth 2 (two) times daily as needed for spasms., Starting Sun 07/29/2017, Print    polyethylene glycol (MIRALAX / GLYCOLAX) packet Take 17 g by mouth daily., Starting Sun 07/29/2017, Print         Loren Racer, MD 07/30/17 [phone]

## 2017-07-29 NOTE — ED Triage Notes (Signed)
Pt presents with c/o epigastric pain, back pain, vaginal pain and headache.

## 2017-09-09 ENCOUNTER — Encounter (HOSPITAL_BASED_OUTPATIENT_CLINIC_OR_DEPARTMENT_OTHER): Payer: Self-pay | Admitting: Emergency Medicine

## 2017-09-09 ENCOUNTER — Other Ambulatory Visit: Payer: Self-pay

## 2017-09-09 ENCOUNTER — Emergency Department (HOSPITAL_BASED_OUTPATIENT_CLINIC_OR_DEPARTMENT_OTHER)
Admission: EM | Admit: 2017-09-09 | Discharge: 2017-09-09 | Disposition: A | Discharge status: Home / Self Care | Payer: Medicaid Other | Attending: Emergency Medicine | Admitting: Emergency Medicine

## 2017-09-09 ENCOUNTER — Emergency Department (HOSPITAL_BASED_OUTPATIENT_CLINIC_OR_DEPARTMENT_OTHER): Payer: Medicaid Other

## 2017-09-09 DIAGNOSIS — Z79899 Other long term (current) drug therapy: Secondary | ICD-10-CM | POA: Diagnosis not present

## 2017-09-09 DIAGNOSIS — R0989 Other specified symptoms and signs involving the circulatory and respiratory systems: Secondary | ICD-10-CM | POA: Diagnosis present

## 2017-09-09 DIAGNOSIS — R52 Pain, unspecified: Secondary | ICD-10-CM | POA: Diagnosis not present

## 2017-09-09 DIAGNOSIS — Z7982 Long term (current) use of aspirin: Secondary | ICD-10-CM | POA: Diagnosis not present

## 2017-09-09 DIAGNOSIS — E119 Type 2 diabetes mellitus without complications: Secondary | ICD-10-CM | POA: Insufficient documentation

## 2017-09-09 DIAGNOSIS — R69 Illness, unspecified: Secondary | ICD-10-CM | POA: Diagnosis not present

## 2017-09-09 DIAGNOSIS — I110 Hypertensive heart disease with heart failure: Secondary | ICD-10-CM | POA: Insufficient documentation

## 2017-09-09 DIAGNOSIS — R21 Rash and other nonspecific skin eruption: Secondary | ICD-10-CM | POA: Diagnosis not present

## 2017-09-09 DIAGNOSIS — Z794 Long term (current) use of insulin: Secondary | ICD-10-CM | POA: Diagnosis not present

## 2017-09-09 DIAGNOSIS — R509 Fever, unspecified: Secondary | ICD-10-CM | POA: Insufficient documentation

## 2017-09-09 DIAGNOSIS — R5383 Other fatigue: Secondary | ICD-10-CM | POA: Diagnosis not present

## 2017-09-09 DIAGNOSIS — Z950 Presence of cardiac pacemaker: Secondary | ICD-10-CM | POA: Insufficient documentation

## 2017-09-09 DIAGNOSIS — I5022 Chronic systolic (congestive) heart failure: Secondary | ICD-10-CM | POA: Insufficient documentation

## 2017-09-09 DIAGNOSIS — Z87891 Personal history of nicotine dependence: Secondary | ICD-10-CM | POA: Diagnosis not present

## 2017-09-09 DIAGNOSIS — J111 Influenza due to unidentified influenza virus with other respiratory manifestations: Secondary | ICD-10-CM

## 2017-09-09 DIAGNOSIS — R109 Unspecified abdominal pain: Secondary | ICD-10-CM | POA: Diagnosis not present

## 2017-09-09 MED ORDER — IBUPROFEN 800 MG PO TABS: 800.0000 mg | ORAL_TABLET | Freq: Three times a day (TID) | ORAL | 0 refills | Status: DC | PRN

## 2017-09-09 MED ORDER — BENZONATATE 100 MG PO CAPS: 100.0000 mg | ORAL_CAPSULE | Freq: Three times a day (TID) | ORAL | 0 refills | Status: DC

## 2017-09-09 MED ORDER — FLUTICASONE PROPIONATE 50 MCG/ACT NA SUSP: 1.0000 | Freq: Every day | NASAL | 2 refills | Status: DC

## 2017-09-09 MED ORDER — ACETAMINOPHEN 325 MG PO TABS
650.0000 mg | ORAL_TABLET | Freq: Once | ORAL | Status: AC | PRN
  Administered 2017-09-09: 650 mg via ORAL
  Filled 2017-09-09: qty 2

## 2017-09-09 MED ORDER — IBUPROFEN 800 MG PO TABS
800.0000 mg | ORAL_TABLET | Freq: Once | ORAL | Status: AC
Start: 2017-09-09 — End: 2017-09-09
  Administered 2017-09-09: 800 mg via ORAL
  Filled 2017-09-09: qty 1

## 2017-09-09 NOTE — ED Triage Notes (Signed)
Fever, cough, body aches since Friday.

## 2017-09-09 NOTE — ED Provider Notes (Signed)
[facility] EMERGENCY DEPARTMENT Provider Note   CSN: [id number] Arrival date & time: 09/09/17  1534     History   Chief Complaint Chief Complaint  Patient presents with  . Fever    HPI Alicia Cook is a 51 y.o. female with a hx of CHF and sarcoidosis presents with complaints of subjective fever and body aches x 2 days. Patient relays she has felt warm and had chills, but has not taken her temperature. Has had generalized body aches, with no localized significant area of pain. States she has had some chest congestion, with minimal coughing. States she has noted an intermittent diffuse abdominal discomfort which she describes as "gas" that is relieved with passing gas or a bowel movement. Has been experiencing intermittent chronic constipation, last BM was yesterday. Denies chest pain, dyspnea, lower extremity swelling, wheezing, N/V/D, hematochezia, or dysuria.   HPI  Past Medical History:  Diagnosis Date  . CHF (congestive heart failure) (HCC)   . Depression   . Diabetes mellitus without complication (HCC)   . Hypertension   . Nonischemic cardiomyopathy (HCC)    a. EF 35-40% by cath in 2011 with normal cors b. s/p ICD implantation in 12/2014 c. EF 25% by NST in 02/01/2016  . Sarcoidosis   . Sarcoidosis 02/08/2016    Patient Active Problem List   Diagnosis Date Noted  . Chest pain 02/08/2016  . Sarcoidosis 02/08/2016  . Benign essential HTN 02/08/2016  . DCM (dilated cardiomyopathy) (HCC) 02/08/2016  . DM (diabetes mellitus), type 2 (HCC) 02/08/2016  . Chronic systolic CHF (congestive heart failure) (HCC) 02/08/2016    Past Surgical History:  Procedure Laterality Date  . ABDOMINAL SURGERY    . CESAREAN SECTION    . CHOLECYSTECTOMY    . INSERTION OF ICD     a. s/p dual-chamber [employer] ICD 12/2014  . PACEMAKER INSERTION    . TUBAL LIGATION      OB History    No data available       Home Medications    Prior to Admission medications     Medication Sig Start Date End Date Taking? Authorizing Provider  aspirin 81 MG chewable tablet Chew 81 mg by mouth daily.    [provider]  carvedilol (COREG) 6.25 MG tablet Take 1 tablet (6.25 mg total) by mouth 2 (two) times daily with a meal. 02/09/16   Strader, GrenadaBrittany M, PA-C  colchicine 0.6 MG tablet Take 1 tablet (0.6 mg total) by mouth daily. 02/09/16   Strader, Lennart PallBrittany M, PA-C  cyclobenzaprine (FLEXERIL) 10 MG tablet Take 10 mg by mouth 2 (two) times daily as needed for muscle spasms.    [provider]  dicyclomine (BENTYL) 20 MG tablet Take 1 tablet (20 mg total) by mouth 2 (two) times daily as needed for spasms. 07/29/17   Loren RacerYelverton, David, MD  FLUoxetine (PROZAC) 40 MG capsule Take 40 mg by mouth daily.    [provider]  fluticasone (FLONASE) 50 MCG/ACT nasal spray Place 1 spray into both nostrils daily. Reported on 02/09/2016    [provider]  folic acid (FOLVITE) 1 MG tablet Take 1 mg by mouth daily.    [provider]  furosemide (LASIX) 20 MG tablet Take 40 mg by mouth daily.    [provider]  HYDROcodone-acetaminophen (NORCO/VICODIN) 5-325 MG tablet Take 1-2 tablets by mouth every 6 (six) hours as needed for moderate pain. 08/13/15   Vanetta MuldersZackowski, Scott, MD  hydroxychloroquine (PLAQUENIL) 200 MG tablet  Take 400 mg by mouth daily.    [provider]  ibuprofen (ADVIL,MOTRIN) 600 MG tablet Take 1 tablet (600mg ) by mouth 3 times daily for 7 days then stop. 02/09/16   Strader, Lennart Pall, PA-C  insulin glargine (LANTUS) 100 UNIT/ML injection Inject 28 Units into the skin at bedtime.    [provider]  insulin lispro (HUMALOG) 100 UNIT/ML injection Inject 1-10 Units into the skin 3 (three) times daily before meals.    [provider]  lisinopril (PRINIVIL,ZESTRIL) 10 MG tablet Take 1 tablet (10 mg total) by mouth daily. 02/09/16   Strader, Lennart Pall, PA-C  metFORMIN (GLUCOPHAGE) 500 MG tablet Take 2,000  mg by mouth daily with breakfast.    [provider]  methotrexate 2.5 MG tablet Take 15 mg by mouth once a week.     [provider]  polyethylene glycol (MIRALAX / GLYCOLAX) packet Take 17 g by mouth daily. 07/29/17   Loren Racer, MD  ranitidine (ZANTAC) 150 MG tablet Take 150 mg by mouth 2 (two) times daily.    [provider]  sitaGLIPtin (JANUVIA) 100 MG tablet Take 100 mg by mouth daily.    [provider]  spironolactone (ALDACTONE) 25 MG tablet Take 25 mg by mouth daily.    [provider]  traZODone (DESYREL) 150 MG tablet Take 150 mg by mouth at bedtime.    [provider]    Family History Family History  Problem Relation Age of Onset  . Heart attack Father        a. Initial onset in his 88's. S/p CABG  . Heart failure Father     Social History Social History   Tobacco Use  . Smoking status: Former Smoker    Packs/day: 1.00    Years: 20.00    Pack years: 20.00    Types: Cigarettes  . Smokeless tobacco: Never Used  Substance Use Topics  . Alcohol use: No    Alcohol/week: 0.0 oz  . Drug use: No     Allergies   Patient has no known allergies.   Review of Systems Review of Systems  Constitutional: Positive for chills, fatigue and fever (subjective. ).  HENT: Positive for congestion and sinus pressure. Negative for ear pain, rhinorrhea and sore throat.   Eyes: Negative for discharge, redness and visual disturbance.  Respiratory: Positive for cough. Negative for shortness of breath.   Cardiovascular: Negative for chest pain.  Gastrointestinal: Positive for abdominal pain ("gas" ) and constipation (chronic, intermittent, last BM yesterday ). Negative for diarrhea, nausea and vomiting.  Genitourinary: Negative for dysuria and hematuria.  Musculoskeletal: Positive for arthralgias and myalgias. Negative for neck pain.  Skin: Positive for rash (Skin lesions on anterior chest that are chronic waxing/waning  consistent with sarcoidosis per patient).  Neurological: Negative for numbness.  Psychiatric/Behavioral: Negative for confusion.     Physical Exam Updated Vital Signs BP (!) 148/75 (BP Location: Left Arm)   Pulse 90   Temp (!) 100.6 F (38.1 C) (Oral)   Resp 20   Ht 5\' 7"  (1.702 m)   Wt 129.7 kg (286 lb)   LMP 04/17/2016   SpO2 99%   BMI 44.79 kg/m   Physical Exam  Constitutional: She appears well-developed and well-nourished. No distress.  HENT:  Head: Normocephalic and atraumatic.  Eyes: Conjunctivae are normal. Pupils are equal, round, and reactive to light. Right eye exhibits no discharge. Left eye exhibits no discharge.  Neck: Normal range of motion.  Cardiovascular:  Normal rate and regular rhythm.  No murmur heard. Pulmonary/Chest: Breath sounds normal. No respiratory distress. She has no wheezes. She has no rales.  Abdominal: Soft. She exhibits no distension. There is tenderness (diffuse mild tenderness, no point tenderness). There is no rebound and no guarding.  Musculoskeletal: She exhibits no edema.  Lymphadenopathy:    She has no cervical adenopathy.  Neurological: She is alert.  Clear speech.   Skin: Skin is warm and dry.  Scaly papular lesions to R chest- patient states consistent with sarcoidosis lesions that she has chronically.   Psychiatric: She has a normal mood and affect. Her behavior is normal.  Nursing note and vitals reviewed.    ED Treatments / Results  Labs (all labs ordered are listed, but only abnormal results are displayed) Labs Reviewed  INFLUENZA PANEL BY PCR (TYPE A & B)   Radiology Dg Abd Acute W/chest  Result Date: 09/09/2017 CLINICAL DATA:  Body aches with fever, nausea, vomiting and diarrhea 2-3 days. EXAM: DG ABDOMEN ACUTE W/ 1V CHEST COMPARISON:  07/29/2017 FINDINGS: Left-sided pacemaker unchanged. Lungs are adequately inflated without focal consolidation or effusion. Cardiomediastinal silhouette and remainder of the chest is  unchanged. Abdominopelvic images demonstrate surgical clips over the right upper quadrant. Bowel gas pattern is nonobstructive. No free peritoneal air several pelvic phleboliths are present. There is mild degenerate change of the spine and hips. IMPRESSION: Nonobstructive bowel gas pattern. No acute cardiopulmonary disease. Electronically Signed   By: Elberta Fortis M.D.   On: 09/09/2017 17:22      Medications Ordered in ED Medications  acetaminophen (TYLENOL) tablet 650 mg (650 mg Oral Given 09/09/17 1554)  ibuprofen (ADVIL,MOTRIN) tablet 800 mg (800 mg Oral Given 09/09/17 1831)     Initial Impression / Assessment and Plan / ED Course  I have reviewed the triage vital signs and the nursing notes.  Pertinent labs & imaging results that were available during my care of the patient were reviewed by me and considered in my medical decision making (see chart for details).   Patient with hx of sarcoidosis and CHF presents with viral-like illness. Febrile on presentation with temp of 100.6- treating with Tylenol and Ibuprofen. Given hx of sarcoidosis increased concern for infection- CXR was ordered and was without acute cardiopulmonary process therefore doubt pneumonia. Abdominal X-ray ordered to evaluate for constipation which was negative. Influenza swab pending. Given array of symptoms in combination with grossly normal physical exam and normal HR will treat for viral illness supportively. Instructed patient to return to the emergency department or follow up with PCP if she experiences any new or worsening symptoms.    Final Clinical Impressions(s) / ED Diagnoses   Final diagnoses:  Influenza-like illness    ED Discharge Orders        Ordered    fluticasone (FLONASE) 50 MCG/ACT nasal spray  Daily     09/09/17 1809    benzonatate (TESSALON) 100 MG capsule  Every 8 hours     09/09/17 1809    ibuprofen (ADVIL,MOTRIN) 800 MG tablet  Every 8 hours PRN     09/09/17 1809       Cherly Anderson, PA-C 09/09/17 1849    Raeford Razor, MD 09/11/17 1105

## 2017-09-09 NOTE — Discharge Instructions (Signed)
Your symptoms are likely viral, we will inform you of the results of your flu swab. Use the Flonase for nasal congestion, the Tessalon for cough, and the Ibuprofen for pain as needed. Follow-up with your primary care provider as needed. Return to the emergency department if you start to experiencing any new/concerning symptoms or worsening of your current symptoms.

## 2017-09-09 NOTE — ED Provider Notes (Signed)
6:21 PM patient seen in conjunction with Petrucelli PA-C.  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  Chest x-ray is clear and lungs sound clear on exam.  No concern for fluid overload.  If anything, patient is a little dry.  Symptoms are consistent with a viral syndrome, possibly flu.  Will treat symptomatically.  Will send flu swab, patient aware that this will not result tonight.  Patient appears well, nontoxic, appropriate for discharge home with supportive treatments.  BP (!) 148/75 (BP Location: Left Arm)   Pulse 90   Temp (!) 100.6 F (38.1 C) (Oral)   Resp 20   Ht 5\' 7"  (1.702 m)   Wt 129.7 kg (286 lb)   LMP 09/14/1991 (Within Weeks)   SpO2 99%   BMI 44.79 kg/m     Renne Crigler, PA-C 09/09/17 1823    09/10/2017 4:43 PM  Checked on influenza panel. Pt tested negative. No further treatment needed.    Renne Crigler, PA-C 09/10/17 1644    Raeford Razor, MD 09/11/17 [phone]

## 2017-09-09 NOTE — ED Notes (Signed)
Pt given d/c instructions as per chart. Rx x 3. Verbalizes understanding. No questions. 

## 2017-09-09 NOTE — ED Notes (Signed)
ED Provider at bedside. PA Sammy

## 2017-09-10 LAB — INFLUENZA PANEL BY PCR (TYPE A & B)
Influenza A By PCR: NEGATIVE
Influenza B By PCR: NEGATIVE

## 2017-11-29 IMAGING — DX DG ABDOMEN ACUTE W/ 1V CHEST
4 series · 4 of 4 positions shown · non-contrast
Comparison: 07/29/2017

CLINICAL DATA: Body aches with fever, nausea, vomiting and diarrhea
2-3 days.

EXAM:
DG ABDOMEN ACUTE W/ 1V CHEST

[chest pa]
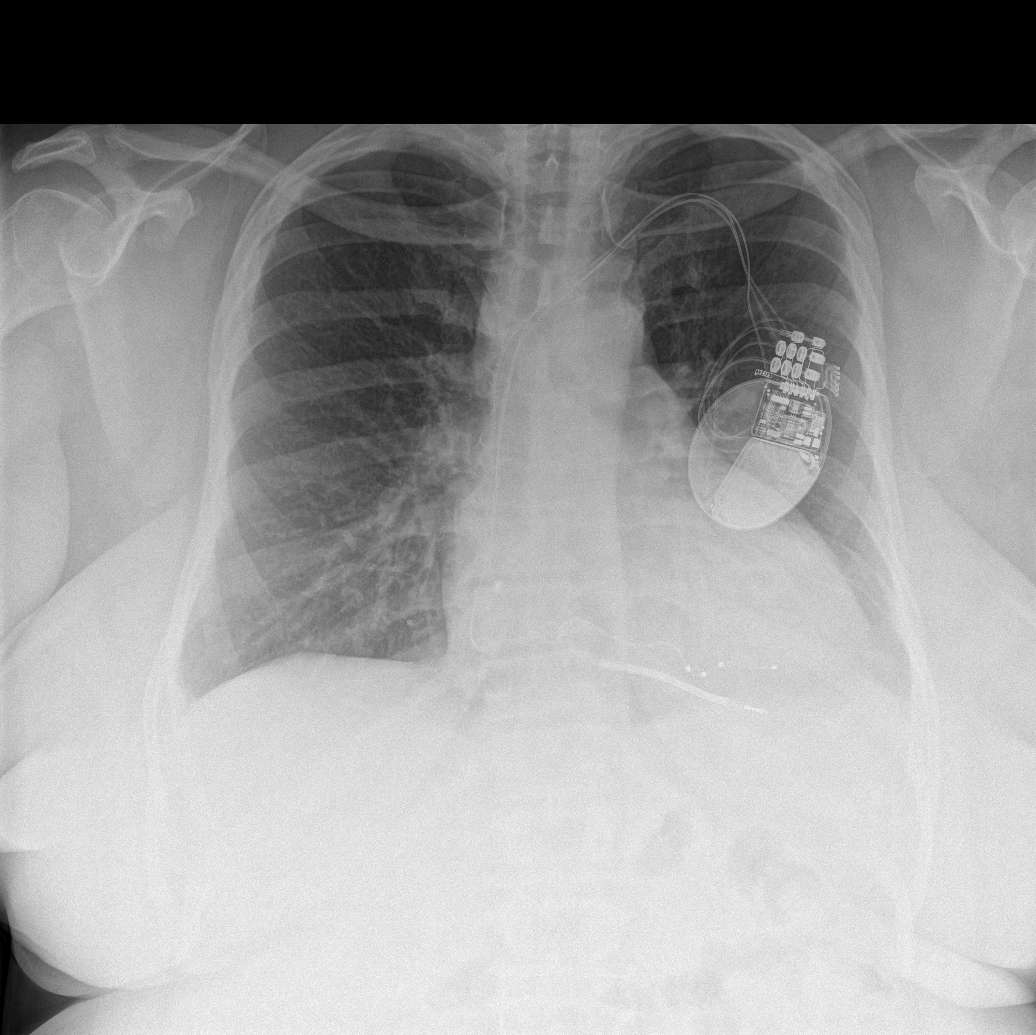

[abdomen erect]
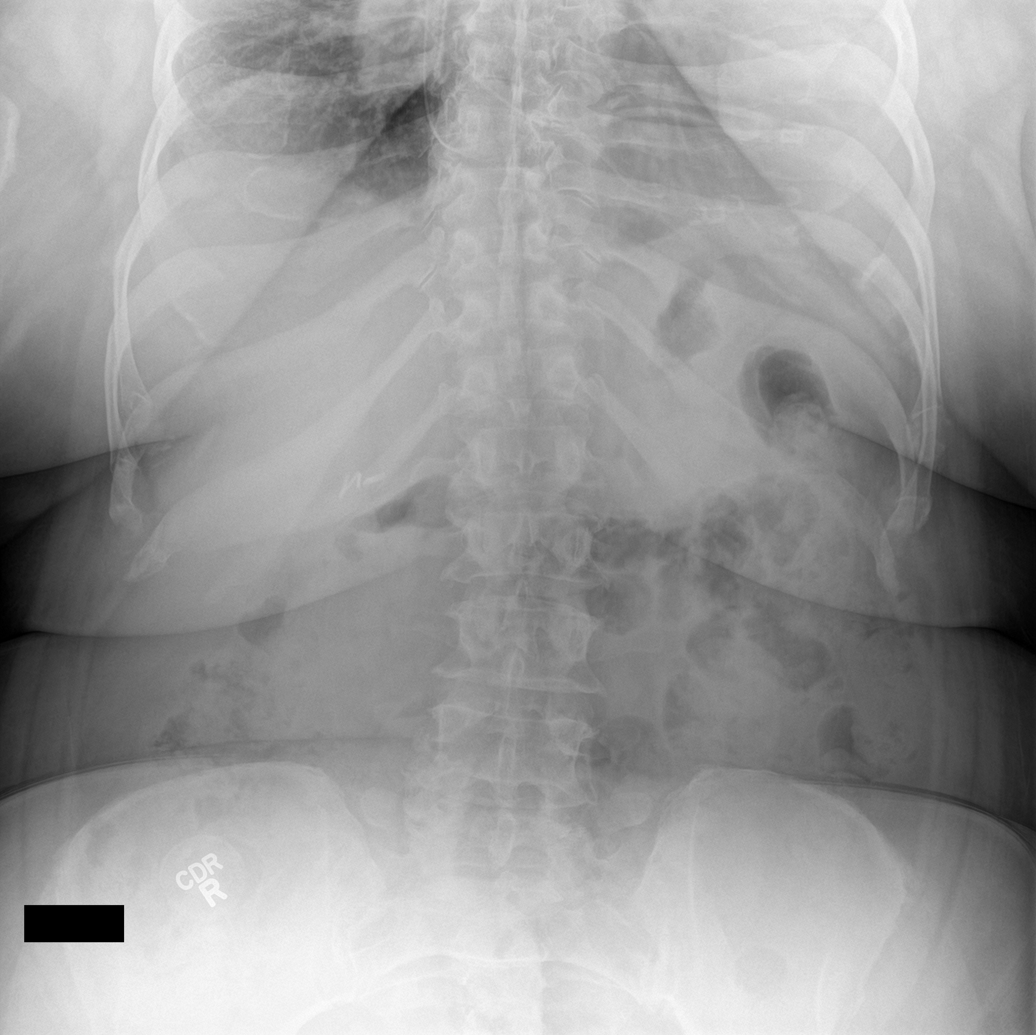

[abdomen supine (1 of 2)]
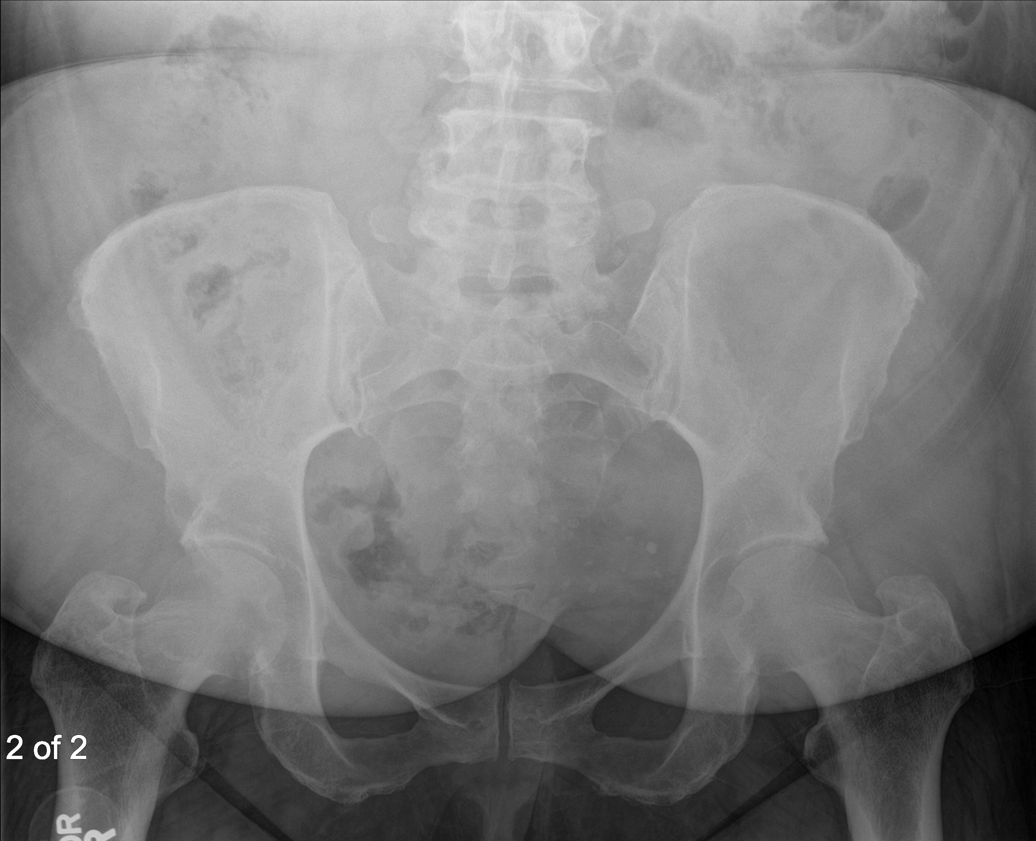

[abdomen supine (2 of 2)]
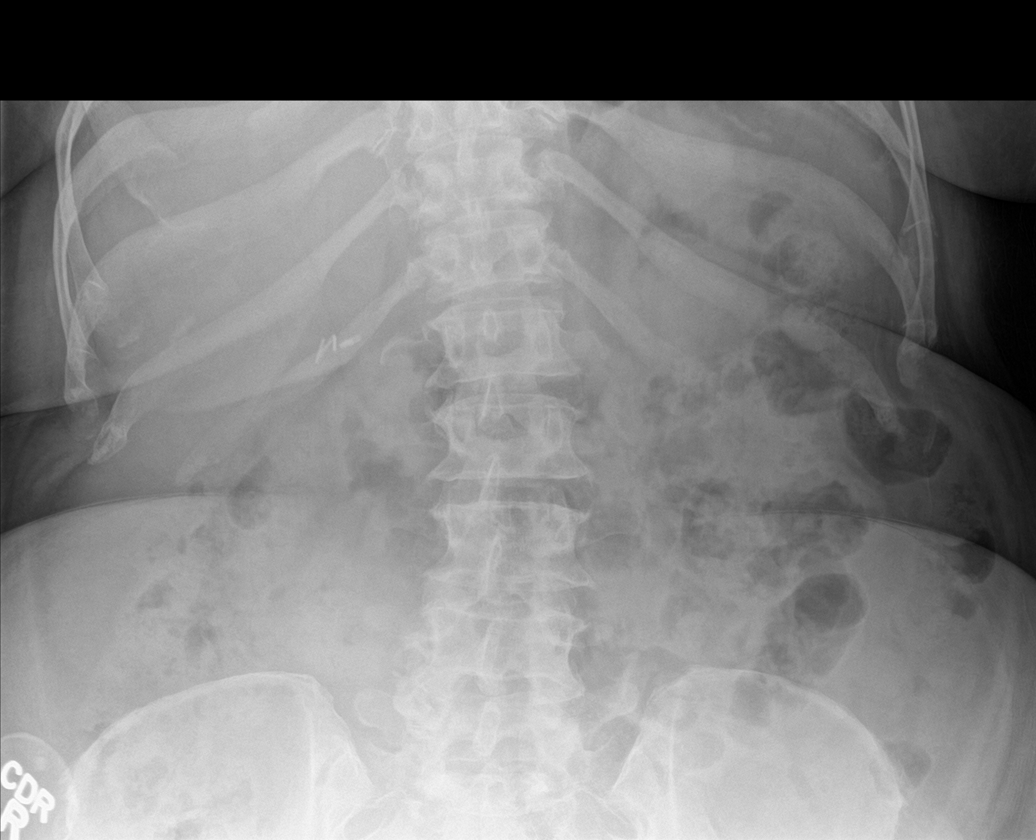

[4 of 4 positions shown; findings below may reference images not displayed]

FINDINGS: Left-sided pacemaker unchanged. Lungs are adequately inflated
without focal consolidation or effusion. Cardiomediastinal
silhouette and remainder of the chest is unchanged.

Abdominopelvic images demonstrate surgical clips over the right
upper quadrant. Bowel gas pattern is nonobstructive. No free
peritoneal air several pelvic phleboliths are present. There is mild
degenerate change of the spine and hips.
IMPRESSION: Nonobstructive bowel gas pattern.

No acute cardiopulmonary disease.

## 2018-01-06 ENCOUNTER — Emergency Department (HOSPITAL_BASED_OUTPATIENT_CLINIC_OR_DEPARTMENT_OTHER)
Admission: EM | Admit: 2018-01-06 | Discharge: 2018-01-06 | Disposition: A | Discharge status: Home / Self Care | Payer: Medicaid Other | Attending: Emergency Medicine | Admitting: Emergency Medicine

## 2018-01-06 ENCOUNTER — Encounter (HOSPITAL_BASED_OUTPATIENT_CLINIC_OR_DEPARTMENT_OTHER): Payer: Self-pay | Admitting: Emergency Medicine

## 2018-01-06 ENCOUNTER — Other Ambulatory Visit: Payer: Self-pay

## 2018-01-06 DIAGNOSIS — Z209 Contact with and (suspected) exposure to unspecified communicable disease: Secondary | ICD-10-CM | POA: Diagnosis not present

## 2018-01-06 DIAGNOSIS — R197 Diarrhea, unspecified: Secondary | ICD-10-CM | POA: Diagnosis not present

## 2018-01-06 DIAGNOSIS — J111 Influenza due to unidentified influenza virus with other respiratory manifestations: Secondary | ICD-10-CM

## 2018-01-06 DIAGNOSIS — E119 Type 2 diabetes mellitus without complications: Secondary | ICD-10-CM | POA: Diagnosis not present

## 2018-01-06 DIAGNOSIS — R1033 Periumbilical pain: Secondary | ICD-10-CM | POA: Diagnosis not present

## 2018-01-06 DIAGNOSIS — Z79899 Other long term (current) drug therapy: Secondary | ICD-10-CM | POA: Diagnosis not present

## 2018-01-06 DIAGNOSIS — Z87891 Personal history of nicotine dependence: Secondary | ICD-10-CM | POA: Diagnosis not present

## 2018-01-06 DIAGNOSIS — R69 Illness, unspecified: Secondary | ICD-10-CM

## 2018-01-06 DIAGNOSIS — M791 Myalgia, unspecified site: Secondary | ICD-10-CM

## 2018-01-06 DIAGNOSIS — I5022 Chronic systolic (congestive) heart failure: Secondary | ICD-10-CM | POA: Diagnosis not present

## 2018-01-06 DIAGNOSIS — Z794 Long term (current) use of insulin: Secondary | ICD-10-CM | POA: Diagnosis not present

## 2018-01-06 DIAGNOSIS — I110 Hypertensive heart disease with heart failure: Secondary | ICD-10-CM | POA: Insufficient documentation

## 2018-01-06 DIAGNOSIS — R112 Nausea with vomiting, unspecified: Secondary | ICD-10-CM

## 2018-01-06 DIAGNOSIS — Z7982 Long term (current) use of aspirin: Secondary | ICD-10-CM | POA: Diagnosis not present

## 2018-01-06 LAB — CBC WITH DIFFERENTIAL/PLATELET
Basophils Absolute: 0 10*3/uL (ref 0.0–0.1)
Basophils Relative: 0 %
Eosinophils Absolute: 0.1 10*3/uL (ref 0.0–0.7)
Eosinophils Relative: 1 %
HCT: 39.8 % (ref 36.0–46.0)
Hemoglobin: 12.6 g/dL (ref 12.0–15.0)
Lymphocytes Relative: 8 %
Lymphs Abs: 0.9 10*3/uL (ref 0.7–4.0)
MCH: 26.6 pg (ref 26.0–34.0)
MCHC: 31.7 g/dL (ref 30.0–36.0)
MCV: 84 fL (ref 78.0–100.0)
Monocytes Absolute: 0.7 10*3/uL (ref 0.1–1.0)
Monocytes Relative: 7 %
Neutro Abs: 8.8 10*3/uL — ABNORMAL HIGH (ref 1.7–7.7)
Neutrophils Relative %: 84 %
Platelets: 269 10*3/uL (ref 150–400)
RBC: 4.74 MIL/uL (ref 3.87–5.11)
RDW: 14.6 % (ref 11.5–15.5)
WBC: 10.4 10*3/uL (ref 4.0–10.5)

## 2018-01-06 LAB — URINALYSIS, MICROSCOPIC (REFLEX): RBC / HPF: NONE SEEN RBC/hpf (ref 0–5)

## 2018-01-06 LAB — COMPREHENSIVE METABOLIC PANEL
ALT: 23 U/L (ref 14–54)
AST: 25 U/L (ref 15–41)
Albumin: 3.8 g/dL (ref 3.5–5.0)
Alkaline Phosphatase: 79 U/L (ref 38–126)
Anion gap: 8 (ref 5–15)
BUN: 18 mg/dL (ref 6–20)
CO2: 27 mmol/L (ref 22–32)
Calcium: 8.6 mg/dL — ABNORMAL LOW (ref 8.9–10.3)
Chloride: 102 mmol/L (ref 101–111)
Creatinine, Ser: 0.85 mg/dL (ref 0.44–1.00)
GFR calc Af Amer: >60 mL/min (ref >60)
GFR calc non Af Amer: >60 mL/min (ref >60)
Glucose, Bld: 135 mg/dL — ABNORMAL HIGH (ref 65–99)
Potassium: 3.9 mmol/L (ref 3.5–5.1)
Sodium: 137 mmol/L (ref 135–145)
Total Bilirubin: 0.4 mg/dL (ref 0.3–1.2)
Total Protein: 7.5 g/dL (ref 6.5–8.1)

## 2018-01-06 LAB — URINALYSIS, ROUTINE W REFLEX MICROSCOPIC
Bilirubin Urine: NEGATIVE
Glucose, UA: >=500 mg/dL — AB
Hgb urine dipstick: NEGATIVE
Ketones, ur: NEGATIVE mg/dL
Leukocytes, UA: NEGATIVE
Nitrite: NEGATIVE
Protein, ur: NEGATIVE mg/dL
Specific Gravity, Urine: 1.025 (ref 1.005–1.030)
pH: 5.5 (ref 5.0–8.0)

## 2018-01-06 LAB — LIPASE, BLOOD: Lipase: 38 U/L (ref 11–51)

## 2018-01-06 LAB — CBG MONITORING, ED: Glucose-Capillary: 196 mg/dL — ABNORMAL HIGH (ref 65–99)

## 2018-01-06 LAB — PREGNANCY, URINE: Preg Test, Ur: NEGATIVE

## 2018-01-06 MED ORDER — OSELTAMIVIR PHOSPHATE 75 MG PO CAPS: 75.0000 mg | ORAL_CAPSULE | Freq: Two times a day (BID) | ORAL | 0 refills | Status: AC

## 2018-01-06 MED ORDER — SODIUM CHLORIDE 0.9 % IV BOLUS (SEPSIS)
1000.0000 mL | Freq: Once | INTRAVENOUS | Status: AC
  Administered 2018-01-06: 1000 mL via INTRAVENOUS

## 2018-01-06 MED ORDER — ONDANSETRON HCL 4 MG/2ML IJ SOLN
4.0000 mg | Freq: Once | INTRAMUSCULAR | Status: AC
  Administered 2018-01-06: 4 mg via INTRAVENOUS
  Filled 2018-01-06: qty 2

## 2018-01-06 MED ORDER — ONDANSETRON 4 MG PO TBDP: 4.0000 mg | ORAL_TABLET | Freq: Three times a day (TID) | ORAL | 0 refills | Status: DC | PRN

## 2018-01-06 MED ORDER — PROCHLORPERAZINE EDISYLATE 5 MG/ML IJ SOLN
10.0000 mg | Freq: Once | INTRAMUSCULAR | Status: AC
  Administered 2018-01-06: 10 mg via INTRAVENOUS
  Filled 2018-01-06: qty 2

## 2018-01-06 MED ORDER — DIPHENHYDRAMINE HCL 50 MG/ML IJ SOLN
25.0000 mg | Freq: Once | INTRAMUSCULAR | Status: AC
  Administered 2018-01-06: 25 mg via INTRAVENOUS
  Filled 2018-01-06: qty 1

## 2018-01-06 MED ORDER — ONDANSETRON 4 MG PO TBDP
4.0000 mg | ORAL_TABLET | Freq: Once | ORAL | Status: AC | PRN
  Administered 2018-01-06: 4 mg via ORAL
  Filled 2018-01-06: qty 1

## 2018-01-06 MED ORDER — KETOROLAC TROMETHAMINE 30 MG/ML IJ SOLN
30.0000 mg | Freq: Once | INTRAMUSCULAR | Status: AC
  Administered 2018-01-06: 30 mg via INTRAVENOUS
  Filled 2018-01-06: qty 1

## 2018-01-06 MED ORDER — ACETAMINOPHEN 325 MG PO TABS
650.0000 mg | ORAL_TABLET | Freq: Once | ORAL | Status: AC
  Administered 2018-01-06: 650 mg via ORAL
  Filled 2018-01-06: qty 2

## 2018-01-06 NOTE — ED Provider Notes (Signed)
[facility] EMERGENCY DEPARTMENT Provider Note   CSN: [id number] Arrival date & time: 01/06/18  1303     History   Chief Complaint Chief Complaint  Patient presents with  . Emesis  . Diarrhea    HPI Alicia Cook is a 52 y.o. female.  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 Abdominal pain, periumbilical/middle, cramping, back hurting too Wasn't aware of temperatur at home.  GB removed, hx of CS   Past Medical History:  Diagnosis Date  . CHF (congestive heart failure) (HCC)   . Depression   . Diabetes mellitus without complication (HCC)   . Hypertension   . Nonischemic cardiomyopathy (HCC)    a. EF 35-40% by cath in 2011 with normal cors b. s/p ICD implantation in 12/2014 c. EF 25% by NST in 02/01/2016  . Sarcoidosis   . Sarcoidosis 02/08/2016    Patient Active Problem List   Diagnosis Date Noted  . Chest pain 02/08/2016  . Sarcoidosis 02/08/2016  . Benign essential HTN 02/08/2016  . DCM (dilated cardiomyopathy) (HCC) 02/08/2016  . DM (diabetes mellitus), type 2 (HCC) 02/08/2016  . Chronic systolic CHF (congestive heart failure) (HCC) 02/08/2016    Past Surgical History:  Procedure Laterality Date  . ABDOMINAL SURGERY    . CARDIAC CATHETERIZATION N/A 02/08/2016   Procedure: Left Heart Cath and Coronary Angiography;  Surgeon: Corky Crafts, MD;  Location: [facility] INVASIVE CV LAB;  Service: Cardiovascular;  Laterality: N/A;  . CESAREAN SECTION    . CHOLECYSTECTOMY    . INSERTION OF ICD     a. s/p dual-chamber [employer] ICD 12/2014  . PACEMAKER INSERTION    . TUBAL LIGATION      OB History    No data available       Home Medications    Prior to Admission medications   Medication  Sig Start Date End Date Taking? Authorizing Provider  aspirin 81 MG chewable tablet Chew 81 mg by mouth daily.    [provider]  benzonatate (TESSALON) 100 MG capsule Take 1 capsule (100 mg total) every 8 (eight) hours by mouth. 09/09/17   Petrucelli, Samantha R, PA-C  carvedilol (COREG) 6.25 MG tablet Take 1 tablet (6.25 mg total) by mouth 2 (two) times daily with a meal. 02/09/16   Strader, [place] M, PA-C  colchicine 0.6 MG tablet Take 1 tablet (0.6 mg total) by mouth daily. 02/09/16   Strader, Lennart Pall, PA-C  cyclobenzaprine (FLEXERIL) 10 MG tablet Take 10 mg by mouth 2 (two) times daily as needed for muscle spasms.    [provider]  dicyclomine (BENTYL) 20 MG tablet Take 1 tablet (20 mg total) by mouth 2 (two) times daily as needed for spasms. 07/29/17   Loren Racer, MD  FLUoxetine (PROZAC) 40 MG capsule Take 40 mg by mouth daily.    [provider]  fluticasone (FLONASE) 50 MCG/ACT nasal spray Place 1 spray daily into both nostrils. 09/09/17   Petrucelli, Samantha R, PA-C  folic acid (FOLVITE) 1 MG tablet Take 1 mg by mouth daily.    [provider]  furosemide (LASIX) 20 MG tablet Take 40 mg by mouth daily.    [provider]  HYDROcodone-acetaminophen (NORCO/VICODIN) 5-325 MG tablet Take 1-2 tablets by mouth every  6 (six) hours as needed for moderate pain. 08/13/15   Vanetta Mulders, MD  hydroxychloroquine (PLAQUENIL) 200 MG tablet Take 400 mg by mouth daily.    [provider]  ibuprofen (ADVIL,MOTRIN) 800 MG tablet Take 1 tablet (800 mg total) every 8 (eight) hours as needed by mouth (pain). 09/09/17   Petrucelli, Samantha R, PA-C  insulin glargine (LANTUS) 100 UNIT/ML injection Inject 28 Units into the skin at bedtime.    [provider]  insulin lispro (HUMALOG) 100 UNIT/ML injection Inject 1-10 Units into the skin 3 (three) times daily before meals.    [provider]  lisinopril (PRINIVIL,ZESTRIL) 10 MG  tablet Take 1 tablet (10 mg total) by mouth daily. 02/09/16   Strader, Lennart Pall, PA-C  metFORMIN (GLUCOPHAGE) 500 MG tablet Take 2,000 mg by mouth daily with breakfast.    [provider]  methotrexate 2.5 MG tablet Take 15 mg by mouth once a week.     [provider]  ondansetron (ZOFRAN ODT) 4 MG disintegrating tablet Take 1 tablet (4 mg total) by mouth every 8 (eight) hours as needed for nausea or vomiting. 01/06/18   Alvira Monday, MD  oseltamivir (TAMIFLU) 75 MG capsule Take 1 capsule (75 mg total) by mouth every 12 (twelve) hours for 5 days. 01/06/18 01/11/18  Alvira Monday, MD  polyethylene glycol (MIRALAX / GLYCOLAX) packet Take 17 g by mouth daily. 07/29/17   Loren Racer, MD  ranitidine (ZANTAC) 150 MG tablet Take 150 mg by mouth 2 (two) times daily.    [provider]  sitaGLIPtin (JANUVIA) 100 MG tablet Take 100 mg by mouth daily.    [provider]  spironolactone (ALDACTONE) 25 MG tablet Take 25 mg by mouth daily.    [provider]  traZODone (DESYREL) 150 MG tablet Take 150 mg by mouth at bedtime.    [provider]    Family History Family History  Problem Relation Age of Onset  . Heart attack Father        a. Initial onset in his 66's. S/p CABG  . Heart failure Father     Social History Social History   Tobacco Use  . Smoking status: Former Smoker    Packs/day: 1.00    Years: 20.00    Pack years: 20.00    Types: Cigarettes  . Smokeless tobacco: Never Used  Substance Use Topics  . Alcohol use: No    Alcohol/week: 0.0 oz  . Drug use: No     Allergies   Patient has no known allergies.   Review of Systems Review of Systems  Constitutional: Positive for fatigue and fever.  HENT: Negative for sore throat.   Eyes: Negative for visual disturbance.  Respiratory: Negative for cough and shortness of breath.   Cardiovascular: Negative for chest pain.  Gastrointestinal: Positive for abdominal pain,  diarrhea, nausea and vomiting.  Genitourinary: Negative for difficulty urinating and dysuria.  Musculoskeletal: Negative for back pain and neck pain.  Skin: Negative for rash.  Neurological: Positive for light-headedness and headaches. Negative for syncope.     Physical Exam Updated Vital Signs BP 131/78 (BP Location: Right Arm)   Pulse 86   Temp 99 F (37.2 C)   Resp 18   Ht 5\' 7"  (1.702 m)   Wt 132.9 kg (293 lb)   SpO2 97%   BMI 45.89 kg/m   Physical Exam  Constitutional: She is oriented to person, place, and time. She appears well-developed and well-nourished. No distress.  HENT:  Head: Normocephalic and atraumatic.  Eyes: Conjunctivae and EOM are normal.  Neck: Normal range of motion.  Cardiovascular: Normal rate, regular rhythm, normal heart sounds and intact distal pulses. Exam reveals no gallop and no friction rub.  No murmur heard. Pulmonary/Chest: Effort normal and breath sounds normal. No respiratory distress. She has no wheezes. She has no rales.  Abdominal: Soft. She exhibits no distension. There is no tenderness. There is no guarding.  Musculoskeletal: She exhibits no edema or tenderness.  Neurological: She is alert and oriented to person, place, and time.  Skin: Skin is warm and dry. No rash noted. She is not diaphoretic. No erythema.  Nursing note and vitals reviewed.    ED Treatments / Results  Labs (all labs ordered are listed, but only abnormal results are displayed) Labs Reviewed  URINALYSIS, ROUTINE W REFLEX MICROSCOPIC - Abnormal; Notable for the following components:      Result Value   Glucose, UA >=500 (*)    All other components within normal limits  URINALYSIS, MICROSCOPIC (REFLEX) - Abnormal; Notable for the following components:   Bacteria, UA MANY (*)    Squamous Epithelial / LPF 6-30 (*)    All other components within normal limits  CBC WITH DIFFERENTIAL/PLATELET - Abnormal; Notable for the following components:   Neutro Abs 8.8 (*)     All other components within normal limits  COMPREHENSIVE METABOLIC PANEL - Abnormal; Notable for the following components:   Glucose, Bld 135 (*)    Calcium 8.6 (*)    All other components within normal limits  CBG MONITORING, ED - Abnormal; Notable for the following components:   Glucose-Capillary 196 (*)    All other components within normal limits  PREGNANCY, URINE  LIPASE, BLOOD    EKG  EKG Interpretation None       Radiology No results found.  Procedures Procedures (including critical care time)  Medications Ordered in ED Medications  ondansetron (ZOFRAN-ODT) disintegrating tablet 4 mg (4 mg Oral Given 01/06/18 1328)  acetaminophen (TYLENOL) tablet 650 mg (650 mg Oral Given 01/06/18 1531)  sodium chloride 0.9 % bolus 1,000 mL (1,000 mLs Intravenous New Bag/Given 01/06/18 1642)  ondansetron (ZOFRAN) injection 4 mg (4 mg Intravenous Given 01/06/18 1643)  ketorolac (TORADOL) 30 MG/ML injection 30 mg (30 mg Intravenous Given 01/06/18 1708)  diphenhydrAMINE (BENADRYL) injection 25 mg (25 mg Intravenous Given 01/06/18 1708)  prochlorperazine (COMPAZINE) injection 10 mg (10 mg Intravenous Given 01/06/18 1708)     Initial Impression / Assessment and Plan / ED Course  I have reviewed the triage vital signs and the nursing notes.  Pertinent labs & imaging results that were available during my care of the patient were reviewed by me and considered in my medical decision making (see chart for details).     52 year old female with a history of CHF, hypertension, diabetes, who works as a Orthoptist, presents with concern for nausea, vomiting, diarrhea, body aches, headache, and elevated temperature.  Abdominal exam benign, have low suspicion for appendicitis, acute small bowel obstruction.  Urinalysis shows no sign of urinary tract infection.  Lipase and transaminases are within normal limits.  Renal function is normal.  Suspect most likely viral gastroenteritis or influenza as  etiology of elevated temperature, body aches, vomiting and diarrhea.  Have low suspicion for meningitis as etiology of headache, given no meningeal signs, normal mental status, other symptoms more consistent with other viral etiology.  Patient was given IV fluids, Zofran, Compazine, Benadryl and Toradol with  improvement of her symptoms.  Recommend continued supportive care.  Give prescription for Zofran.  Discussed that given her multiple influenza contacts, as well as body aches, fever and other symptoms, it is reasonable to empirically treat her for the flu with and with Tamiflu.  Discussed patient, and she would like Tamiflu prescription.  Given prescription for Tamiflu and Zofran, and discussed return precautions.  Final Clinical Impressions(s) / ED Diagnoses   Final diagnoses:  Nausea vomiting and diarrhea  Myalgia  Influenza-like illness    ED Discharge Orders        Ordered    ondansetron (ZOFRAN ODT) 4 MG disintegrating tablet  Every 8 hours PRN     01/06/18 1733    oseltamivir (TAMIFLU) 75 MG capsule  Every 12 hours     01/06/18 1733       Alvira Monday, MD 01/06/18 1733

## 2018-01-06 NOTE — ED Triage Notes (Addendum)
N/V/D, body aches, started this morning. Pt took ibuprofen this morning.

## 2018-02-24 ENCOUNTER — Other Ambulatory Visit: Payer: Self-pay

## 2018-02-24 ENCOUNTER — Emergency Department (HOSPITAL_BASED_OUTPATIENT_CLINIC_OR_DEPARTMENT_OTHER): Payer: Medicaid Other

## 2018-02-24 ENCOUNTER — Emergency Department (HOSPITAL_BASED_OUTPATIENT_CLINIC_OR_DEPARTMENT_OTHER)
Admission: EM | Admit: 2018-02-24 | Discharge: 2018-02-24 | Disposition: A | Discharge status: Home / Self Care | Payer: Medicaid Other | Attending: Emergency Medicine | Admitting: Emergency Medicine

## 2018-02-24 ENCOUNTER — Encounter (HOSPITAL_BASED_OUTPATIENT_CLINIC_OR_DEPARTMENT_OTHER): Payer: Self-pay | Admitting: Emergency Medicine

## 2018-02-24 DIAGNOSIS — I110 Hypertensive heart disease with heart failure: Secondary | ICD-10-CM | POA: Insufficient documentation

## 2018-02-24 DIAGNOSIS — Z79899 Other long term (current) drug therapy: Secondary | ICD-10-CM | POA: Diagnosis not present

## 2018-02-24 DIAGNOSIS — Z794 Long term (current) use of insulin: Secondary | ICD-10-CM | POA: Insufficient documentation

## 2018-02-24 DIAGNOSIS — Z950 Presence of cardiac pacemaker: Secondary | ICD-10-CM | POA: Diagnosis not present

## 2018-02-24 DIAGNOSIS — R0789 Other chest pain: Secondary | ICD-10-CM | POA: Diagnosis not present

## 2018-02-24 DIAGNOSIS — Z7982 Long term (current) use of aspirin: Secondary | ICD-10-CM | POA: Insufficient documentation

## 2018-02-24 DIAGNOSIS — Z87891 Personal history of nicotine dependence: Secondary | ICD-10-CM | POA: Insufficient documentation

## 2018-02-24 DIAGNOSIS — E119 Type 2 diabetes mellitus without complications: Secondary | ICD-10-CM | POA: Diagnosis not present

## 2018-02-24 DIAGNOSIS — I5022 Chronic systolic (congestive) heart failure: Secondary | ICD-10-CM | POA: Diagnosis not present

## 2018-02-24 DIAGNOSIS — R079 Chest pain, unspecified: Secondary | ICD-10-CM | POA: Diagnosis present

## 2018-02-24 LAB — URINALYSIS, ROUTINE W REFLEX MICROSCOPIC
Bilirubin Urine: NEGATIVE
Glucose, UA: >=500 mg/dL — AB
Hgb urine dipstick: NEGATIVE
Ketones, ur: NEGATIVE mg/dL
Leukocytes, UA: NEGATIVE
Nitrite: NEGATIVE
Protein, ur: NEGATIVE mg/dL
Specific Gravity, Urine: 1.015 (ref 1.005–1.030)
pH: 6 (ref 5.0–8.0)

## 2018-02-24 LAB — BASIC METABOLIC PANEL
Anion gap: 7 (ref 5–15)
BUN: 10 mg/dL (ref 6–20)
CO2: 24 mmol/L (ref 22–32)
Calcium: 9.3 mg/dL (ref 8.9–10.3)
Chloride: 107 mmol/L (ref 101–111)
Creatinine, Ser: 0.59 mg/dL (ref 0.44–1.00)
GFR calc Af Amer: >60 mL/min (ref >60)
GFR calc non Af Amer: >60 mL/min (ref >60)
Glucose, Bld: 111 mg/dL — ABNORMAL HIGH (ref 65–99)
Potassium: 4.1 mmol/L (ref 3.5–5.1)
Sodium: 138 mmol/L (ref 135–145)

## 2018-02-24 LAB — URINALYSIS, MICROSCOPIC (REFLEX)
RBC / HPF: NONE SEEN RBC/hpf (ref 0–5)
WBC, UA: NONE SEEN WBC/hpf (ref 0–5)

## 2018-02-24 LAB — D-DIMER, QUANTITATIVE: D-Dimer, Quant: 0.91 ug/mL-FEU — ABNORMAL HIGH (ref 0.00–0.50)

## 2018-02-24 LAB — PREGNANCY, URINE: Preg Test, Ur: NEGATIVE

## 2018-02-24 MED ORDER — ACETAMINOPHEN 500 MG PO TABS
1000.0000 mg | ORAL_TABLET | Freq: Once | ORAL | Status: AC
  Administered 2018-02-24: 1000 mg via ORAL
  Filled 2018-02-24: qty 2

## 2018-02-24 MED ORDER — IOPAMIDOL (ISOVUE-370) INJECTION 76%
100.0000 mL | Freq: Once | INTRAVENOUS | Status: AC | PRN
  Administered 2018-02-24: 100 mL via INTRAVENOUS

## 2018-02-24 NOTE — Discharge Instructions (Addendum)
Take Tylenol as directed for pain.  See your doctor if you continue to have significant pain in a week.  Return if concern for any reason

## 2018-02-24 NOTE — ED Notes (Signed)
ED Provider at bedside. Dr. Ethelda Chick

## 2018-02-24 NOTE — ED Notes (Signed)
ED Provider at bedside. 

## 2018-02-24 NOTE — ED Provider Notes (Signed)
[facility] EMERGENCY DEPARTMENT Provider Note   CSN: [id number] Arrival date & time: 02/24/18  1707     History   Chief Complaint Chief Complaint  Patient presents with  . Flank Pain    HPI Alicia Cook is a 52 y.o. female.  Complains of pain at right posterior rib cage onset gradually 2 weeks ago.  Pain is worse with deep inspiration and with changing positions improved with remaining still.  She denies any shortness of breath.  Denies fever denies cough.  Denies flank pain or abdominal pain.  No urinary symptoms.  She was [facility] 4 days ago prescribed Norflex which she is taking without relief.  No other associated symptoms.  HPI  Past Medical History:  Diagnosis Date  . CHF (congestive heart failure) (HCC)   . Depression   . Diabetes mellitus without complication (HCC)   . Hypertension   . Nonischemic cardiomyopathy (HCC)    a. EF 35-40% by cath in 2011 with normal cors b. s/p ICD implantation in 12/2014 c. EF 25% by NST in 02/01/2016  . Sarcoidosis   . Sarcoidosis 02/08/2016    Patient Active Problem List   Diagnosis Date Noted  . Chest pain 02/08/2016  . Sarcoidosis 02/08/2016  . Benign essential HTN 02/08/2016  . DCM (dilated cardiomyopathy) (HCC) 02/08/2016  . DM (diabetes mellitus), type 2 (HCC) 02/08/2016  . Chronic systolic CHF (congestive heart failure) (HCC) 02/08/2016    Past Surgical History:  Procedure Laterality Date  . ABDOMINAL SURGERY    . CARDIAC CATHETERIZATION N/A 02/08/2016   Procedure: Left Heart Cath and Coronary Angiography;  Surgeon: Corky Crafts, MD;  Location: [facility] INVASIVE CV LAB;  Service: Cardiovascular;  Laterality: N/A;  . CESAREAN SECTION    . CHOLECYSTECTOMY    . INSERTION OF ICD     a. s/p dual-chamber [employer] ICD 12/2014  . PACEMAKER INSERTION    . TUBAL LIGATION       OB History   None      Home Medications    Prior to Admission medications   Medication Sig Start Date End  Date Taking? Authorizing Provider  orphenadrine (NORFLEX) 100 MG tablet Take 100 mg by mouth 2 (two) times daily.   Yes [provider]  aspirin 81 MG chewable tablet Chew 81 mg by mouth daily.    [provider]  benzonatate (TESSALON) 100 MG capsule Take 1 capsule (100 mg total) every 8 (eight) hours by mouth. 09/09/17   Petrucelli, Samantha R, PA-C  carvedilol (COREG) 6.25 MG tablet Take 1 tablet (6.25 mg total) by mouth 2 (two) times daily with a meal. 02/09/16   Strader, [place] M, PA-C  colchicine 0.6 MG tablet Take 1 tablet (0.6 mg total) by mouth daily. 02/09/16   Strader, Lennart Pall, PA-C  cyclobenzaprine (FLEXERIL) 10 MG tablet Take 10 mg by mouth 2 (two) times daily as needed for muscle spasms.    [provider]  dicyclomine (BENTYL) 20 MG tablet Take 1 tablet (20 mg total) by mouth 2 (two) times daily as needed for spasms. 07/29/17   Loren Racer, MD  FLUoxetine (PROZAC) 40 MG capsule Take 40 mg by mouth daily.    [provider]  fluticasone (FLONASE) 50 MCG/ACT nasal spray Place 1 spray daily into both nostrils. 09/09/17   Petrucelli, Samantha R, PA-C  folic acid (FOLVITE) 1 MG tablet Take 1 mg by mouth daily.    [provider]  furosemide (LASIX)  20 MG tablet Take 40 mg by mouth daily.    [provider]  HYDROcodone-acetaminophen (NORCO/VICODIN) 5-325 MG tablet Take 1-2 tablets by mouth every 6 (six) hours as needed for moderate pain. 08/13/15   Vanetta Mulders, MD  hydroxychloroquine (PLAQUENIL) 200 MG tablet Take 400 mg by mouth daily.    [provider]  ibuprofen (ADVIL,MOTRIN) 800 MG tablet Take 1 tablet (800 mg total) every 8 (eight) hours as needed by mouth (pain). 09/09/17   Petrucelli, Samantha R, PA-C  insulin glargine (LANTUS) 100 UNIT/ML injection Inject 28 Units into the skin at bedtime.    [provider]  insulin lispro (HUMALOG) 100 UNIT/ML injection Inject 1-10 Units into the skin 3 (three)  times daily before meals.    [provider]  lisinopril (PRINIVIL,ZESTRIL) 10 MG tablet Take 1 tablet (10 mg total) by mouth daily. 02/09/16   Strader, Lennart Pall, PA-C  metFORMIN (GLUCOPHAGE) 500 MG tablet Take 2,000 mg by mouth daily with breakfast.    [provider]  methotrexate 2.5 MG tablet Take 15 mg by mouth once a week.     [provider]  ondansetron (ZOFRAN ODT) 4 MG disintegrating tablet Take 1 tablet (4 mg total) by mouth every 8 (eight) hours as needed for nausea or vomiting. 01/06/18   Alvira Monday, MD  polyethylene glycol (MIRALAX / GLYCOLAX) packet Take 17 g by mouth daily. 07/29/17   Loren Racer, MD  ranitidine (ZANTAC) 150 MG tablet Take 150 mg by mouth 2 (two) times daily.    [provider]  sitaGLIPtin (JANUVIA) 100 MG tablet Take 100 mg by mouth daily.    [provider]  spironolactone (ALDACTONE) 25 MG tablet Take 25 mg by mouth daily.    [provider]  traZODone (DESYREL) 150 MG tablet Take 150 mg by mouth at bedtime.    [provider]    Family History Family History  Problem Relation Age of Onset  . Heart attack Father        a. Initial onset in his 30's. S/p CABG  . Heart failure Father     Social History Social History   Tobacco Use  . Smoking status: Former Smoker    Packs/day: 1.00    Years: 20.00    Pack years: 20.00    Types: Cigarettes  . Smokeless tobacco: Never Used  Substance Use Topics  . Alcohol use: No    Alcohol/week: 0.0 oz  . Drug use: No     Allergies   Patient has no known allergies.   Review of Systems Review of Systems  Constitutional: Negative.   HENT: Negative.   Respiratory: Negative.   Cardiovascular: Positive for chest pain.       Right posterior rib cage pain  Gastrointestinal: Negative.   Genitourinary:       Postmenopausal  Musculoskeletal: Negative.   Skin: Negative.   Allergic/Immunologic: Positive for immunocompromised state.        Diabetic  Neurological: Negative.   Psychiatric/Behavioral: Negative.   All other systems reviewed and are negative.    Physical Exam Updated Vital Signs BP 125/83 (BP Location: Right Arm)   Pulse 84   Temp 98.3 F (36.8 C) (Oral)   Resp 20   Ht 5\' 7"  (1.702 m)   Wt 134.3 kg (296 lb)   SpO2 98%   BMI 46.36 kg/m   Physical Exam  Constitutional: She appears well-developed and well-nourished.  HENT:  Head: Normocephalic and atraumatic.  Eyes: Pupils  are equal, round, and reactive to light. Conjunctivae are normal.  Neck: Neck supple. No tracheal deviation present. No thyromegaly present.  Cardiovascular: Normal rate and regular rhythm.  No murmur heard. Pulmonary/Chest: Effort normal and breath sounds normal. She exhibits tenderness.  Tender at right posterior lateral chest, reproducing pain exactly  Abdominal: Soft. Bowel sounds are normal. She exhibits no distension. There is no tenderness.  Morbidly obese  Genitourinary:  Genitourinary Comments: noFlank tenderness  Musculoskeletal: Normal range of motion. She exhibits no edema or tenderness.  Neurological: She is alert. Coordination normal.  Skin: Skin is warm and dry. No rash noted.  Psychiatric: She has a normal mood and affect.  Nursing note and vitals reviewed.    ED Treatments / Results  Labs (all labs ordered are listed, but only abnormal results are displayed) Labs Reviewed  URINALYSIS, ROUTINE W REFLEX MICROSCOPIC  PREGNANCY, URINE  D-DIMER, QUANTITATIVE (NOT AT [facility])  BASIC METABOLIC PANEL    EKG None  Radiology No results found.  Procedures Procedures (including critical care time)  Medications Ordered in ED Medications  acetaminophen (TYLENOL) tablet 1,000 mg (has no administration in time range)    Results for orders placed or performed during the hospital encounter of 02/24/18  Urinalysis, Routine w reflex microscopic- may I&O cath if menses  Result Value Ref Range   Color, Urine  YELLOW YELLOW   APPearance CLEAR CLEAR   Specific Gravity, Urine 1.015 1.005 - 1.030   pH 6.0 5.0 - 8.0   Glucose, UA >=500 (A) NEGATIVE mg/dL   Hgb urine dipstick NEGATIVE NEGATIVE   Bilirubin Urine NEGATIVE NEGATIVE   Ketones, ur NEGATIVE NEGATIVE mg/dL   Protein, ur NEGATIVE NEGATIVE mg/dL   Nitrite NEGATIVE NEGATIVE   Leukocytes, UA NEGATIVE NEGATIVE  Pregnancy, urine  Result Value Ref Range   Preg Test, Ur NEGATIVE NEGATIVE  D-dimer, quantitative (not at [facility])  Result Value Ref Range   D-Dimer, Quant 0.91 (H) 0.00 - 0.50 ug/mL-FEU  Basic metabolic panel  Result Value Ref Range   Sodium 138 135 - 145 mmol/L   Potassium 4.1 3.5 - 5.1 mmol/L   Chloride 107 101 - 111 mmol/L   CO2 24 22 - 32 mmol/L   Glucose, Bld 111 (H) 65 - 99 mg/dL   BUN 10 6 - 20 mg/dL   Creatinine, Ser 1.61 0.44 - 1.00 mg/dL   Calcium 9.3 8.9 - 09.6 mg/dL   GFR calc non Af Amer >60 >60 mL/min   GFR calc Af Amer >60 >60 mL/min   Anion gap 7 5 - 15  Urinalysis, Microscopic (reflex)  Result Value Ref Range   RBC / HPF NONE SEEN 0 - 5 RBC/hpf   WBC, UA NONE SEEN 0 - 5 WBC/hpf   Bacteria, UA RARE (A) NONE SEEN   Squamous Epithelial / LPF 0-5 0 - 5   Budding Yeast PRESENT    Dg Chest 2 View  Result Date: 02/24/2018 CLINICAL DATA:  Right flank pain x2 weeks. No fever. Ex-smoker and asthma. EXAM: CHEST - 2 VIEW COMPARISON:  09/09/2017 FINDINGS: The heart size and mediastinal contours are within normal limits. Left-sided ICD device with leads in the right atrium, coronary sinus and right ventricle. Left basilar opacities likely representing minimal atelectasis. The visualized skeletal structures are unremarkable. IMPRESSION: No active cardiopulmonary disease.  Left basilar atelectasis. Electronically Signed   By: Tollie Eth M.D.   On: 02/24/2018 20:51   Ct Angio Chest Pe W And/or Wo Contrast  Result Date:  02/24/2018 CLINICAL DATA:  Right-sided flank rib pain x1 month. EXAM: CT ANGIOGRAPHY CHEST WITH CONTRAST  TECHNIQUE: Multidetector CT imaging of the chest was performed using the standard protocol during bolus administration of intravenous contrast. Multiplanar CT image reconstructions and MIPs were obtained to evaluate the vascular anatomy. CONTRAST:  ISOVUE-370 IOPAMIDOL (ISOVUE-370) INJECTION 76% COMPARISON:  78 cc Isovue 370 FINDINGS: Cardiovascular: Satisfactory opacification of the pulmonary arteries to the proximal segmental level. No evidence of pulmonary embolism. Mild cardiomegaly without pericardial effusion. No pericardial effusion. ICD device noted over the left anterior chest wall with leads in the right atrium and right ventricle. Mediastinum/Nodes: No aortic aneurysm or dissection. Several nonpathologic sized superior mediastinal lymph nodes without pathologic enlargement are identified as before. The largest lymph node within the mediastinum is right lower paratracheal measuring 12 mm and stable. Lungs/Pleura: Atelectasis and/or scarring at each lung base left greater than right. No effusion or pneumothorax. No dominant mass. Upper Abdomen: Cholecystectomy.  No acute abnormality. Musculoskeletal: No chest wall abnormality. Stable mild degenerative change along the thoracolumbar spine. No acute or significant osseous findings. Review of the MIP images confirms the above findings. IMPRESSION: 1. No acute central pulmonary embolus. 2. Bibasilar atelectasis and/or scarring. 3. Stable mild presumed reactive mediastinal lymphadenopathy. Electronically Signed   By: Tollie Eth M.D.   On: 02/24/2018 22:58   Initial Impression / Assessment and Plan / ED Course  I have reviewed the triage vital signs and the nursing notes.  Pertinent labs & imaging results that were available during my care of the patient were reviewed by me and considered in my medical decision making (see chart for details).    11:15 PM patient's pain is controlled after treatment with Tylenol.  Exam is consistent with chest  wall pain.  Low clinical pretest suspicion for pulmonary embolism and no PE seen on CT scan. Patient is okay with Tylenol for pain.  Follow-up with PMD if significant pain in a week   labWork unremarkable Final Clinical Impressions(s) / ED Diagnoses  Dx chest wall pain Final diagnoses:  None    ED Discharge Orders    None       Doug Sou, MD 02/24/18 2337

## 2018-02-24 NOTE — ED Triage Notes (Signed)
Patient states that she has had pain to her right rib cage / flank area x 2 weeks. Went to [facility] on wed and was placed on medications. Was told that if it did not get better than she needed to come here

## 2018-02-24 NOTE — ED Notes (Signed)
Patient transported to CT 

## 2018-02-24 NOTE — ED Notes (Signed)
ED Provider at bedside. Dr. Shela Commons

## 2018-04-06 ENCOUNTER — Other Ambulatory Visit: Payer: Self-pay

## 2018-04-06 ENCOUNTER — Emergency Department (HOSPITAL_BASED_OUTPATIENT_CLINIC_OR_DEPARTMENT_OTHER): Payer: Medicaid Other

## 2018-04-06 ENCOUNTER — Encounter (HOSPITAL_BASED_OUTPATIENT_CLINIC_OR_DEPARTMENT_OTHER): Payer: Self-pay | Admitting: Emergency Medicine

## 2018-04-06 ENCOUNTER — Emergency Department (HOSPITAL_BASED_OUTPATIENT_CLINIC_OR_DEPARTMENT_OTHER)
Admission: EM | Admit: 2018-04-06 | Discharge: 2018-04-06 | Disposition: A | Discharge status: Home / Self Care | Payer: Medicaid Other | Attending: Emergency Medicine | Admitting: Emergency Medicine

## 2018-04-06 DIAGNOSIS — Z794 Long term (current) use of insulin: Secondary | ICD-10-CM | POA: Insufficient documentation

## 2018-04-06 DIAGNOSIS — Z950 Presence of cardiac pacemaker: Secondary | ICD-10-CM | POA: Insufficient documentation

## 2018-04-06 DIAGNOSIS — E119 Type 2 diabetes mellitus without complications: Secondary | ICD-10-CM | POA: Diagnosis not present

## 2018-04-06 DIAGNOSIS — I5022 Chronic systolic (congestive) heart failure: Secondary | ICD-10-CM | POA: Diagnosis not present

## 2018-04-06 DIAGNOSIS — Z87891 Personal history of nicotine dependence: Secondary | ICD-10-CM | POA: Diagnosis not present

## 2018-04-06 DIAGNOSIS — Z7982 Long term (current) use of aspirin: Secondary | ICD-10-CM | POA: Insufficient documentation

## 2018-04-06 DIAGNOSIS — R070 Pain in throat: Secondary | ICD-10-CM | POA: Diagnosis present

## 2018-04-06 DIAGNOSIS — J029 Acute pharyngitis, unspecified: Secondary | ICD-10-CM

## 2018-04-06 DIAGNOSIS — I110 Hypertensive heart disease with heart failure: Secondary | ICD-10-CM | POA: Insufficient documentation

## 2018-04-06 DIAGNOSIS — Z79899 Other long term (current) drug therapy: Secondary | ICD-10-CM | POA: Diagnosis not present

## 2018-04-06 LAB — RAPID STREP SCREEN (MED CTR MEBANE ONLY): Streptococcus, Group A Screen (Direct): NEGATIVE

## 2018-04-06 MED ORDER — PREDNISONE 50 MG PO TABS: 50.0000 mg | ORAL_TABLET | Freq: Every day | ORAL | 0 refills | Status: AC

## 2018-04-06 MED ORDER — PREDNISONE 50 MG PO TABS: 50.0000 mg | ORAL_TABLET | Freq: Every day | ORAL | 0 refills | Status: DC

## 2018-04-06 MED ORDER — GI COCKTAIL ~~LOC~~
30.0000 mL | Freq: Once | ORAL | Status: AC
  Administered 2018-04-06: 30 mL via ORAL
  Filled 2018-04-06: qty 30

## 2018-04-06 NOTE — Discharge Instructions (Addendum)
You were seen today for a sore throat. Your test was negative for strep throat.  You likely have a viral pharyngitis and you may even have a mild sarcoidosis flare.  For both of these issues we are going to give you prednisone to calm down the inflammation.  Please take 1 pill every day for the next 5 days.  You can drink warm tea with honey and take Tylenol as needed for pain. Please follow with your PCP.

## 2018-04-06 NOTE — ED Notes (Signed)
EDP into room, prior to RN assessment, see MD notes, orders received and initiated.    Alert, NAD, calm, interactive, resps e/u, speaking in clear complete sentences, no dyspnea noted, skin W&D, VSS/improved, c/o sore throat, mentions finished recent ABT, prescribed at [facility], has had 2 negative strep tests recently, also mentions recent but resolved, cough, congestion and ear popping, (denies: sob, NVD, fever, facial pain, dizziness or visual changes).  Pt to xray.

## 2018-04-06 NOTE — ED Triage Notes (Signed)
Patient states that she went to the [facility] last week on wed. The patient states that she was treated for a sore throat. She reports that she continues to have the sore throat and is now intermittently SOB  And has 2 bumps at the back of her throat. She also notes that her eyes are red and this is a sign of sarcoidosis flair up

## 2018-04-06 NOTE — ED Provider Notes (Signed)
[facility] EMERGENCY DEPARTMENT Provider Note   CSN: [id number] Arrival date & time: 04/06/18  2124     History   Chief Complaint Chief Complaint  Patient presents with  . Sore Throat    HPI Alicia Cook is a 52 y.o. female with PMH of CHF, DM, cardiomyopathy, sarcoidosis here for sore throat  SORE THROAT  Sore throat began 10 days ago. Pain interferes with: swallowing Medications tried: ibuprofen and tylenol  Strep throat exposure: no STD exposure: no  Symptoms Fever: no Cough: intermittent Runny nose: yes but it has mostly resolved Muscle aches: no Swollen Glands: yes Trouble breathing: no Drooling: no Weight loss: no  She notes that she went to the [facility] twice at [facility] over the last 2 weeks.  She notes that both times she was told that she had an ear infection and pharyngitis and was given antibiotics.  She does not member the name of the antibiotics.  She notes that she took these antibiotics and her throat pain persists.  She has also been trying lidocaine without relief.  Patient feels like she is getting a sarcoidosis flare as she is having burning and redness in her eyes, heavy breathing, feeling like she is more tired.  Denies any chest pain, increased edema, nausea, vomiting, diarrhea, constipation, abdominal pain.  HPI  Past Medical History:  Diagnosis Date  . CHF (congestive heart failure) (HCC)   . Depression   . Diabetes mellitus without complication (HCC)   . Hypertension   . Nonischemic cardiomyopathy (HCC)    a. EF 35-40% by cath in 2011 with normal cors b. s/p ICD implantation in 12/2014 c. EF 25% by NST in 02/01/2016  . Sarcoidosis   . Sarcoidosis 02/08/2016    Patient Active Problem List   Diagnosis Date Noted  . Chest pain 02/08/2016  . Sarcoidosis 02/08/2016  . Benign essential HTN 02/08/2016  . DCM (dilated cardiomyopathy) (HCC) 02/08/2016  . DM (diabetes mellitus), type 2 (HCC) 02/08/2016  . Chronic systolic CHF  (congestive heart failure) (HCC) 02/08/2016    Past Surgical History:  Procedure Laterality Date  . ABDOMINAL SURGERY    . CARDIAC CATHETERIZATION N/A 02/08/2016   Procedure: Left Heart Cath and Coronary Angiography;  Surgeon: Corky Crafts, MD;  Location: [facility] INVASIVE CV LAB;  Service: Cardiovascular;  Laterality: N/A;  . CESAREAN SECTION    . CHOLECYSTECTOMY    . INSERTION OF ICD     a. s/p dual-chamber [employer] ICD 12/2014  . PACEMAKER INSERTION    . TUBAL LIGATION       OB History   None      Home Medications    Prior to Admission medications   Medication Sig Start Date End Date Taking? Authorizing Provider  aspirin 81 MG chewable tablet Chew 81 mg by mouth daily.    [provider]  benzonatate (TESSALON) 100 MG capsule Take 1 capsule (100 mg total) every 8 (eight) hours by mouth. 09/09/17   Petrucelli, Samantha R, PA-C  carvedilol (COREG) 6.25 MG tablet Take 1 tablet (6.25 mg total) by mouth 2 (two) times daily with a meal. 02/09/16   Strader, [place] M, PA-C  colchicine 0.6 MG tablet Take 1 tablet (0.6 mg total) by mouth daily. 02/09/16   Strader, Lennart Pall, PA-C  cyclobenzaprine (FLEXERIL) 10 MG tablet Take 10 mg by mouth 2 (two) times daily as needed for muscle spasms.    [provider]  dicyclomine (BENTYL) 20 MG tablet Take 1  tablet (20 mg total) by mouth 2 (two) times daily as needed for spasms. 07/29/17   Loren Racer, MD  FLUoxetine (PROZAC) 40 MG capsule Take 40 mg by mouth daily.    [provider]  fluticasone (FLONASE) 50 MCG/ACT nasal spray Place 1 spray daily into both nostrils. 09/09/17   Petrucelli, Samantha R, PA-C  folic acid (FOLVITE) 1 MG tablet Take 1 mg by mouth daily.    [provider]  furosemide (LASIX) 20 MG tablet Take 40 mg by mouth daily.    [provider]  HYDROcodone-acetaminophen (NORCO/VICODIN) 5-325 MG tablet Take 1-2 tablets by mouth every 6 (six) hours as needed for moderate  pain. 08/13/15   Vanetta Mulders, MD  hydroxychloroquine (PLAQUENIL) 200 MG tablet Take 400 mg by mouth daily.    [provider]  ibuprofen (ADVIL,MOTRIN) 800 MG tablet Take 1 tablet (800 mg total) every 8 (eight) hours as needed by mouth (pain). 09/09/17   Petrucelli, Samantha R, PA-C  insulin glargine (LANTUS) 100 UNIT/ML injection Inject 28 Units into the skin at bedtime.    [provider]  insulin lispro (HUMALOG) 100 UNIT/ML injection Inject 1-10 Units into the skin 3 (three) times daily before meals.    [provider]  lisinopril (PRINIVIL,ZESTRIL) 10 MG tablet Take 1 tablet (10 mg total) by mouth daily. 02/09/16   Strader, Lennart Pall, PA-C  metFORMIN (GLUCOPHAGE) 500 MG tablet Take 2,000 mg by mouth daily with breakfast.    [provider]  methotrexate 2.5 MG tablet Take 15 mg by mouth once a week.     [provider]  ondansetron (ZOFRAN ODT) 4 MG disintegrating tablet Take 1 tablet (4 mg total) by mouth every 8 (eight) hours as needed for nausea or vomiting. 01/06/18   Alvira Monday, MD  orphenadrine (NORFLEX) 100 MG tablet Take 100 mg by mouth 2 (two) times daily.    [provider]  polyethylene glycol (MIRALAX / GLYCOLAX) packet Take 17 g by mouth daily. 07/29/17   Loren Racer, MD  predniSONE (DELTASONE) 50 MG tablet Take 1 tablet (50 mg total) by mouth daily with breakfast for 5 days. 04/06/18 04/11/18  Beaulah Dinning, MD  ranitidine (ZANTAC) 150 MG tablet Take 150 mg by mouth 2 (two) times daily.    [provider]  sitaGLIPtin (JANUVIA) 100 MG tablet Take 100 mg by mouth daily.    [provider]  spironolactone (ALDACTONE) 25 MG tablet Take 25 mg by mouth daily.    [provider]  traZODone (DESYREL) 150 MG tablet Take 150 mg by mouth at bedtime.    [provider]    Family History Family History  Problem Relation Age of Onset  . Heart attack Father        a. Initial onset in  his 32's. S/p CABG  . Heart failure Father     Social History Social History   Tobacco Use  . Smoking status: Former Smoker    Packs/day: 1.00    Years: 20.00    Pack years: 20.00    Types: Cigarettes  . Smokeless tobacco: Never Used  Substance Use Topics  . Alcohol use: No    Alcohol/week: 0.0 oz  . Drug use: No     Allergies   Patient has no known allergies.   Review of Systems Review of Systems  All other systems reviewed and are negative.  Physical Exam Updated Vital Signs BP 116/84   Pulse 92   Temp 98.7  F (37.1 C) (Oral)   Resp (!) 22   Ht 5\' 7"  (1.702 m)   Wt 134.3 kg (296 lb)   SpO2 100%   BMI 46.36 kg/m   Physical Exam  Constitutional: She is oriented to person, place, and time. She appears well-developed and well-nourished.  obese  HENT:  Head: Normocephalic and atraumatic.  Right Ear: Tympanic membrane and ear canal normal. No drainage.  Left Ear: Tympanic membrane and ear canal normal. No drainage.  Nose: Nose normal. No mucosal edema or rhinorrhea.  Mouth/Throat: Uvula is midline and mucous membranes are normal. Posterior oropharyngeal erythema (mild) present. No oropharyngeal exudate or tonsillar abscesses. Tonsils are 2+ on the right. Tonsils are 2+ on the left. No tonsillar exudate.  Eyes: Pupils are equal, round, and reactive to light.  Neck: Normal range of motion. Neck supple.  Large neck girth. Difficult to palpate lymph nodes  Cardiovascular: Normal rate, regular rhythm and normal heart sounds.  No murmur heard. Distant heart sounds  Pulmonary/Chest: Effort normal and breath sounds normal. No respiratory distress. She has no wheezes. She has no rhonchi. She exhibits no tenderness.  Distant lung sounds  Abdominal: Soft. Bowel sounds are normal. There is no tenderness.  Lymphadenopathy:    She has cervical adenopathy.  Neurological: She is alert and oriented to person, place, and time.  Skin: Skin is warm and dry. Capillary refill  takes less than 2 seconds. No rash noted.  Psychiatric: She has a normal mood and affect. Her behavior is normal.    ED Treatments / Results  Labs (all labs ordered are listed, but only abnormal results are displayed) Labs Reviewed  RAPID STREP SCREEN ([facility] ONLY)  CULTURE, GROUP A STREP [facility])    EKG None  Radiology Dg Chest 2 View  Result Date: 04/06/2018 CLINICAL DATA:  Subacute onset of sore throat and acute onset of fever. EXAM: CHEST - 2 VIEW COMPARISON:  Chest radiograph and CTA of the chest performed 02/24/2018 FINDINGS: The lungs are well-aerated. Mild vascular congestion is noted. There is no evidence of focal opacification, pleural effusion or pneumothorax. The heart is borderline normal in size. A pacemaker/AICD is noted at the left chest wall, with leads ending at the right atrium, right ventricle and coronary sinus. No acute osseous abnormalities are seen. IMPRESSION: Mild vascular congestion noted; lungs remain grossly clear. Electronically Signed   By: Roanna Raider M.D.   On: 04/06/2018 22:50    Procedures Procedures (including critical care time)  Medications Ordered in ED Medications  gi cocktail (Maalox,Lidocaine,Donnatal) (30 mLs Oral Given 04/06/18 2227)     Initial Impression / Assessment and Plan / ED Course  I have reviewed the triage vital signs and the nursing notes.  Pertinent labs & imaging results that were available during my care of the patient were reviewed by me and considered in my medical decision making (see chart for details).    52 year old female seen today for sore throat. Pharynx is slightly erythematous on exam with enlarged tonsils.  She is afebrile here with otherwise normal vitals.  Rapid strep negative. CXR done due to reports of heavy breathing, no signs of pneumonia or fluid overload.  GI cocktail given which provided relief.  Likely viral pharyngitis with possible sarcoid flair with lymphadenopathy and dry eyes. Will give 5 day  prednisone course. Patient will take Tylenol as needed, drink warm tea with honey, and will follow up with PCP. Stable for discharge.  Final Clinical Impressions(s) / ED Diagnoses  Final diagnoses:  Pharyngitis, unspecified etiology    ED Discharge Orders        Ordered    predniSONE (DELTASONE) 50 MG tablet  Daily with breakfast,   Status:  Discontinued     04/06/18 2257     04/06/18 2257       Beaulah Dinning, MD 04/06/18 2310    Maia Plan, MD 04/07/18 [phone]

## 2018-04-06 NOTE — ED Notes (Signed)
EDP into room 

## 2018-04-09 LAB — CULTURE, GROUP A STREP (THRC)

## 2018-11-04 DIAGNOSIS — G4733 Obstructive sleep apnea (adult) (pediatric): Secondary | ICD-10-CM

## 2018-12-03 ENCOUNTER — Ambulatory Visit: Payer: Self-pay | Admitting: Cardiology

## 2018-12-04 HISTORY — PX: CATARACT EXTRACTION: SUR2

## 2018-12-05 ENCOUNTER — Ambulatory Visit: Payer: Medicaid Other | Admitting: Cardiology

## 2018-12-05 ENCOUNTER — Encounter: Payer: Self-pay | Admitting: Cardiology

## 2018-12-05 VITALS — BP 122/79 | HR 86 | Ht 67.0 in | Wt 292.0 lb

## 2018-12-05 DIAGNOSIS — I5022 Chronic systolic (congestive) heart failure: Secondary | ICD-10-CM | POA: Diagnosis not present

## 2018-12-05 DIAGNOSIS — E119 Type 2 diabetes mellitus without complications: Secondary | ICD-10-CM

## 2018-12-05 DIAGNOSIS — I428 Other cardiomyopathies: Secondary | ICD-10-CM

## 2018-12-05 DIAGNOSIS — I10 Essential (primary) hypertension: Secondary | ICD-10-CM

## 2018-12-05 DIAGNOSIS — Z95810 Presence of automatic (implantable) cardiac defibrillator: Secondary | ICD-10-CM

## 2018-12-05 DIAGNOSIS — Z794 Long term (current) use of insulin: Secondary | ICD-10-CM

## 2018-12-05 DIAGNOSIS — R002 Palpitations: Secondary | ICD-10-CM

## 2018-12-05 MED ORDER — SIMVASTATIN 5 MG PO TABS: 5.0000 mg | ORAL_TABLET | Freq: Every day | ORAL | 3 refills | Status: DC

## 2018-12-05 NOTE — Progress Notes (Signed)
Subjective:   @Patient  ID@: Alicia Cook, female    DOB: 1966/05/10, 53 y.o.   MRN: [id number]  Alicia Jo, FNP:  Chief Complaint  Patient presents with  . Follow-up    3 month f/u  . Cardiomyopathy    HPI   The patient is a 53 year old female who presents for a Follow-up for Cardiomyopathy. 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 Cardiac MRI performed in 2011 and cardiac PET scan in August 2017 does not appear to be consistent with cardiac sarcoidosis.  She also has uncontrolled diabetes, morbid obesity, mild sleep apnea but not tolerating CPAP, systemic hypertension. Last episode of acute decompensated heart failure was in March 2016 when she underwent CRT-D implant, and 2nd exacerbation was in April 2017.  Patient is here for 3 month follow up for cardiomyopathy. Since being on aggressive medical management, she has had significant improvement in symptoms and also improvement in LVEF to 40% on last echo in Oct 2019.   She is presently doing well without any significant complaints. She has recently finished prednisone course; therefore, she has noticed her blood sugar has been uncontrolled. She is to follow up with her PCP next week. Underwent right cataract surgery yesterday. She was evaluated by pulmonology this morning and by their report has not been compliant with CPAP. She continues to have occasional palpitations that she states worsened over the last 1 month, but over the last few days have improved. Described as short burst of heart racing and flip flopping.   States that she was previously on lipitor; however, started having hair loss with this. She does not have history of hyperlipidemia, but in view of diabetes, was started on this.   Past  Medical History:  Diagnosis Date  . Cataract   . CHF (congestive heart failure) (HCC)   . Depression   . Diabetes mellitus without complication (HCC)   . Hypertension   . Nonischemic cardiomyopathy (HCC)    a. EF 35-40% by cath in 2011 with normal cors b. s/p ICD implantation in 12/2014 c. EF 25% by NST in 02/01/2016  . Sarcoidosis   . Sarcoidosis 02/08/2016    Past Surgical History:  Procedure Laterality Date  . ABDOMINAL SURGERY    . CARDIAC CATHETERIZATION N/A 02/08/2016   Procedure: Left Heart Cath and Coronary Angiography;  Surgeon: Corky Crafts, MD;  [facility] CV LAB;  Service: Cardiovascular;  Laterality: N/A;  . CATARACT EXTRACTION Right 12/04/2018  . CESAREAN SECTION    . CHOLECYSTECTOMY    . INSERTION OF ICD     a. s/p dual-chamber [employer] ICD 12/2014  . PACEMAKER INSERTION    . TUBAL LIGATION      Family History  Problem Relation Age of Onset  . Heart attack Father        a. Initial onset in his 12's. S/p CABG  . Heart failure Father     Social History   Socioeconomic History  . Marital status: Single    Spouse name: Not on file  . Number of children: Not on file  . Years of education: Not on file  . Highest education level: Not on file  Occupational History  . Not on file  Social Needs  . Financial resource strain: Not on file  . Food  insecurity:    Worry: Not on file    Inability: Not on file  . Transportation needs:    Medical: Not on file    Non-medical: Not on file  Tobacco Use  . Smoking status: Former Smoker    Packs/day: 1.00    Years: 20.00    Pack years: 20.00    Types: Cigarettes  . Smokeless tobacco: Never Used  Substance and Sexual Activity  . Alcohol use: No    Alcohol/week: 0.0 standard drinks  . Drug use: No  . Sexual activity: Not on file  Lifestyle  . Physical activity:    Days per week: Not on file    Minutes per session: Not on file  . Stress: Not on file  Relationships  . Social  connections:    Talks on phone: Not on file    Gets together: Not on file    Attends religious service: Not on file    Active member of club or organization: Not on file    Attends meetings of clubs or organizations: Not on file    Relationship status: Not on file  . Intimate partner violence:    Fear of current or ex partner: Not on file    Emotionally abused: Not on file    Physically abused: Not on file    Forced sexual activity: Not on file  Other Topics Concern  . Not on file  Social History Narrative  . Not on file    Current Outpatient Medications on File Prior to Visit  Medication Sig Dispense Refill  . aspirin 81 MG chewable tablet Chew 81 mg by mouth daily.    . carvedilol (COREG) 6.25 MG tablet Take 1 tablet (6.25 mg total) by mouth 2 (two) times daily with a meal. (Patient taking differently: Take 25 mg by mouth 2 (two) times daily with a meal. 1 and a half) 60 tablet 6  . colchicine 0.6 MG tablet Take 1 tablet (0.6 mg total) by mouth daily. 30 tablet 3  . folic acid (FOLVITE) 1 MG tablet Take 3 mg by mouth daily.     . furosemide (LASIX) 20 MG tablet Take 40 mg by mouth as needed.     Marland Kitchen. ibuprofen (ADVIL,MOTRIN) 800 MG tablet Take 1 tablet (800 mg total) every 8 (eight) hours as needed by mouth (pain). 21 tablet 0  . Insulin Detemir (LEVEMIR FLEXTOUCH) 100 UNIT/ML Pen Inject 40 Units as directed at bedtime.    . metFORMIN (GLUCOPHAGE) 500 MG tablet Take 2,000 mg by mouth daily with breakfast.    . methotrexate 2.5 MG tablet Take 15 mg by mouth once a week.     . sacubitril-valsartan (ENTRESTO) 97-103 MG Take 1 tablet by mouth 2 (two) times daily.    . sitaGLIPtin (JANUVIA) 100 MG tablet Take 100 mg by mouth daily.    Marland Kitchen. spironolactone (ALDACTONE) 25 MG tablet Take 25 mg by mouth daily.    . Vitamin D, Ergocalciferol, (DRISDOL) 1.25 MG (50000 UT) CAPS capsule Take 50,000 Units by mouth once a week.    . dicyclomine (BENTYL) 20 MG tablet Take 1 tablet (20 mg total) by mouth  2 (two) times daily as needed for spasms. (Patient not taking: Reported on 12/05/2018) 20 tablet 0  . FLUoxetine (PROZAC) 40 MG capsule Take 40 mg by mouth daily.    . fluticasone (FLONASE) 50 MCG/ACT nasal spray Place 1 spray daily into both nostrils. (Patient not taking: Reported on 12/05/2018) 16 g 2  .  HYDROcodone-acetaminophen (NORCO/VICODIN) 5-325 MG tablet Take 1-2 tablets by mouth every 6 (six) hours as needed for moderate pain. (Patient not taking: Reported on 12/05/2018) 10 tablet 0  . hydroxychloroquine (PLAQUENIL) 200 MG tablet Take 400 mg by mouth daily.    . insulin glargine (LANTUS) 100 UNIT/ML injection Inject 28 Units into the skin at bedtime.    . insulin lispro (HUMALOG) 100 UNIT/ML injection Inject 1-10 Units into the skin 3 (three) times daily before meals.    Marland Kitchen lisinopril (PRINIVIL,ZESTRIL) 10 MG tablet Take 1 tablet (10 mg total) by mouth daily. (Patient not taking: Reported on 12/05/2018) 30 tablet 6  . omeprazole (PRILOSEC) 20 MG capsule Take 20 mg by mouth as needed.    . ondansetron (ZOFRAN ODT) 4 MG disintegrating tablet Take 1 tablet (4 mg total) by mouth every 8 (eight) hours as needed for nausea or vomiting. (Patient not taking: Reported on 12/05/2018) 20 tablet 0  . orphenadrine (NORFLEX) 100 MG tablet Take 100 mg by mouth 2 (two) times daily.    . polyethylene glycol (MIRALAX / GLYCOLAX) packet Take 17 g by mouth daily. (Patient not taking: Reported on 12/05/2018) 14 each 0  . ranitidine (ZANTAC) 150 MG tablet Take 150 mg by mouth 2 (two) times daily.    . traZODone (DESYREL) 150 MG tablet Take 150 mg by mouth at bedtime.     No current facility-administered medications on file prior to visit.      Review of Systems  Constitution: Negative for decreased appetite, malaise/fatigue, weight gain and weight loss.  Eyes: Positive for blurred vision (right cataract surgery).  Cardiovascular: Positive for dyspnea on exertion (stable; minimal) and palpitations. Negative for chest  pain, claudication, leg swelling and orthopnea.  Respiratory: Negative for cough and wheezing.   Endocrine: Negative for cold intolerance and heat intolerance.  Hematologic/Lymphatic: Does not bruise/bleed easily.  Musculoskeletal: Negative for myalgias.  Neurological: Negative for dizziness, focal weakness and headaches.  Psychiatric/Behavioral: Negative for suicidal ideas.  All other systems reviewed and are negative.      Objective:     Echocardiogram 08/26/2018: Left ventricle cavity is normal in size. Mild concentric hypertrophy of the left ventricle. Visual EF is 45-50%. Abnormal septal wall motion due to right ventricle pacemaker. Doppler evidence of grade II (pseudonormal) diastolic dysfunction, elevated LAP. Calculated EF 40%. Device lead seen in right ventricle.  Left atrial cavity is moderately dilated. Mild mitral regurgitation. Trace tricuspid regurgitation. Inadequate tricuspid regurgitation jet to estimate pulmonary artery pressure IVC is normal with blunted respiratory response. Estimated RA pressure 8 mmHg. Compared to previous cath on 12/27/2017, LVEF is improved. No significant pericardial effusion seen.  Nuclear stress test 02/01/2016: No significant ST segment changes or arrhythmias were noted during stress  Negative ETT for ischemia at workload achieved  No significant chest pain symptoms reported  No significant arrhythmia noted  nuclear images report to follow  Right heart catheterization and myocardial biopsy 07/17/2016: Mild pulmonary hypertension, mildly elevated pulmonary capillary wedge pressure, biopsy negative for sarcoid.  Physical Exam  Constitutional: She is oriented to person, place, and time. Vital signs are normal. She appears well-developed and well-nourished.  HENT:  Head: Normocephalic and atraumatic.  Neck: Normal range of motion.  Cardiovascular: Normal rate, regular rhythm, normal heart sounds and intact distal pulses.  ICD placement  scar  Pulmonary/Chest: Effort normal and breath sounds normal. No accessory muscle usage. No respiratory distress.  Abdominal: Soft. Bowel sounds are normal.  Musculoskeletal: Normal range of motion.  Neurological: She  is alert and oriented to person, place, and time.  Skin: Skin is warm, dry and intact.  Psychiatric: She has a normal mood and affect.  Vitals reviewed.          Assessment & Recommendations:   1. Chronic systolic CHF (congestive heart failure) (HCC) She is on appropriate medical therapy and is essentially asymptomatic. No clinical evidence of heart failure.   2. Benign essential HTN Well controlled with current medical therapy. Continue the same. Encouraged her to continue to avoid salt in her diet.   3. Cardiac defibrillator in situ Reviewed recent remote transmission. Stable and functioning appropriately. No events. Did have 25 beats of NSVT. Recommended rechecking again in 3 months. She will need in office check in July 2020.   4. Nonischemic cardiomyopathy (HCC) As stated above, no clinical evidence of heart failure. No changes in medications today.   5. Type 2 diabetes mellitus without complication, with long-term current use of insulin (HCC) Reports hyperglycemia with steroid therapy. She is to follow up with PCP. Did not previously tolerate lipitor. Agree that she would benefit from statin therapy in view of diabetes. Lipids are normal. I will start her on Simvastatin 5mg  daily and recheck lipids and CMP in 3 months.   6. Palpitations Suggestive of PVC's. Has not had frequent episodes of NSVT. Likely exacerbated by prednisone over the last month. As she is now off prednisone, will continue to watch and if symptoms do not improve encouraged her to contact me.   I will see her back in 3 months or sooner if problems.    [place], FNP-C [facility] Office: [phone] Fax: [phone]

## 2018-12-06 ENCOUNTER — Encounter: Payer: Self-pay | Admitting: Cardiology

## 2018-12-31 ENCOUNTER — Telehealth: Payer: Self-pay

## 2018-12-31 NOTE — Telephone Encounter (Signed)
Pt is wondering if there is any way possible that the Pt can get on a weight loss program, or have the gastric sleeve surgery.  Pt is wanting the gastric sleeve surgery preferably.  Please Advise.

## 2018-12-31 NOTE — Telephone Encounter (Signed)
Yes. She needs to call [facility] Bariatric program herself and has to enroll. Nobody can refer her whether Medicaid or Private insurance. She will then get the information needed to decide

## 2019-01-01 NOTE — Telephone Encounter (Signed)
Called Pt and made her aware. Stated she is concerned it will cause complications with her heart.  Advised Pt to follow through with calling [facility] Bariatric Program to speak with them, and get her information that she needs in order to proceed with her goals.

## 2019-01-17 ENCOUNTER — Other Ambulatory Visit: Payer: Self-pay | Admitting: Cardiology

## 2019-01-23 ENCOUNTER — Other Ambulatory Visit: Payer: Self-pay

## 2019-01-24 ENCOUNTER — Other Ambulatory Visit: Payer: Self-pay

## 2019-01-24 DIAGNOSIS — I5022 Chronic systolic (congestive) heart failure: Secondary | ICD-10-CM

## 2019-01-24 MED ORDER — SACUBITRIL-VALSARTAN 97-103 MG PO TABS: 1.0000 | ORAL_TABLET | Freq: Two times a day (BID) | ORAL | 5 refills | Status: DC

## 2019-02-17 ENCOUNTER — Other Ambulatory Visit: Payer: Self-pay | Admitting: Cardiology

## 2019-02-17 ENCOUNTER — Telehealth (INDEPENDENT_AMBULATORY_CARE_PROVIDER_SITE_OTHER): Payer: Medicaid Other | Admitting: Cardiology

## 2019-02-17 DIAGNOSIS — I428 Other cardiomyopathies: Secondary | ICD-10-CM | POA: Diagnosis not present

## 2019-02-17 DIAGNOSIS — I10 Essential (primary) hypertension: Secondary | ICD-10-CM | POA: Diagnosis not present

## 2019-02-17 DIAGNOSIS — I5022 Chronic systolic (congestive) heart failure: Secondary | ICD-10-CM

## 2019-02-17 MED ORDER — SPIRONOLACTONE 25 MG PO TABS: 25.0000 mg | ORAL_TABLET | ORAL | 1 refills | Status: DC

## 2019-02-17 NOTE — Telephone Encounter (Signed)
Patient called stating that was the past 2 days she has noticed gradual worsening dyspnea.  She also states that for the past 3-4 days she has stopped using spironolactone and Jardiance as she ran out of Rx.  No chest pain. States she feels cool at her ICD site. No PND or orthopnea, no palpitations, dizziness or syncope.  I have recommended that she restart taking furosemide for the next 2-3 days on a daily basis and I will be sending in spironolactone prescription and will arrange for an office visit artery which will follow-up in one week.  She will call us if she does not feel well in the next one to 2 days or if she notices leg edema, PND or orthopnea.  This is a 12 minute telephone encounter. Encouraged to pick up Jardiance as well. She has done well so far.

## 2019-02-19 DIAGNOSIS — Z95810 Presence of automatic (implantable) cardiac defibrillator: Secondary | ICD-10-CM | POA: Diagnosis not present

## 2019-02-19 DIAGNOSIS — I43 Cardiomyopathy in diseases classified elsewhere: Secondary | ICD-10-CM | POA: Diagnosis not present

## 2019-02-19 DIAGNOSIS — Z4502 Encounter for adjustment and management of automatic implantable cardiac defibrillator: Secondary | ICD-10-CM | POA: Diagnosis not present

## 2019-02-19 NOTE — Telephone Encounter (Signed)
Pt said that she is feeling better since taking the furosemide. Dyspnea has subsided.//ah

## 2019-02-27 ENCOUNTER — Other Ambulatory Visit: Payer: Self-pay

## 2019-03-06 ENCOUNTER — Other Ambulatory Visit: Payer: Self-pay

## 2019-03-06 ENCOUNTER — Encounter: Payer: Self-pay | Admitting: Cardiology

## 2019-03-06 ENCOUNTER — Ambulatory Visit: Payer: Medicaid Other | Admitting: Cardiology

## 2019-03-06 VITALS — BP 128/66 | HR 82 | Ht 67.0 in | Wt 297.0 lb

## 2019-03-06 DIAGNOSIS — I10 Essential (primary) hypertension: Secondary | ICD-10-CM

## 2019-03-06 DIAGNOSIS — E119 Type 2 diabetes mellitus without complications: Secondary | ICD-10-CM | POA: Diagnosis not present

## 2019-03-06 DIAGNOSIS — Z95810 Presence of automatic (implantable) cardiac defibrillator: Secondary | ICD-10-CM | POA: Diagnosis not present

## 2019-03-06 DIAGNOSIS — I428 Other cardiomyopathies: Secondary | ICD-10-CM | POA: Diagnosis not present

## 2019-03-06 DIAGNOSIS — Z794 Long term (current) use of insulin: Secondary | ICD-10-CM

## 2019-03-06 DIAGNOSIS — I5022 Chronic systolic (congestive) heart failure: Secondary | ICD-10-CM | POA: Diagnosis not present

## 2019-03-06 DIAGNOSIS — I471 Supraventricular tachycardia: Secondary | ICD-10-CM

## 2019-03-06 NOTE — Progress Notes (Signed)
Primary Physician/Referring:  [name], FNP  Patient ID: Alicia Cook, female    DOB: Nov 19, 1965, 53 y.o.   MRN: [id number]  Chief Complaint  Patient presents with   Hyperlipidemia   Cardiomyopathy   Follow-up    71mo   This visit type was conducted due to national recommendations for restrictions regarding the COVID-19 Pandemic (e.g. social distancing).  This format is felt to be most appropriate for this patient at this time.  All issues noted in this document were discussed and addressed.  No physical exam was performed (except for noted visual exam findings with Telehealth visits).  The patient has consented to conduct a Telehealth visit and understands insurance will be billed.   I discussed the limitations of evaluation and management by telemedicine and the availability of in person appointments. The patient expressed understanding and agreed to proceed.  Virtual Visit via Video Note is as below  I connected with Ms. Jacqulyn Ducking, on 03/06/2019 at 1430 by a video enabled telemedicine application and verified that I am speaking with the correct person using two identifiers.     I have discussed with her regarding the safety during COVID Pandemic and steps and precautions including social distancing with the patient.    HPI: Alicia Cook  is a 53 y.o. 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 Cardiac MRI performed in 2011 and cardiac PET scan in August 2017 does not appear to be consistent with cardiac sarcoidosis.  She also has uncontrolled diabetes, morbid obesity, mild sleep apnea but not tolerating CPAP, systemic hypertension. Last episode of acute decompensated heart failure was in March 2016 when she underwent CRT-D implant, and 2nd exacerbation was in April 2017.  This is a 3 month  follow up for cardiomyopathy. Since being on aggressive medical management, she has had significant improvement in symptoms and also improvement in LVEF to 40% on last echo in Oct 2019.   She is presently doing well without any significant complaints. Has some fatigue, but is trying to walk more to help with this. She did recently have an episode of chest heaviness and worsening shortness of breath for a few days that resolved with taking lasix. No leg edema. She is now feeling much better. She continues to have occasional palpitations that she describes as brief heart racing. She was noted to have 8 sec of SVT on last remote transmission.   She was started on Simvastatin at her last office visit as she reported lipitor made her hair fall out. Lipids are well controlled. She continues to work and is concerned about her risk as they are testing at her office.     Past Medical History:  Diagnosis Date   Cataract    CHF (congestive heart failure) (HCC)    Depression    Diabetes mellitus without complication (HCC)    Hypertension    Nonischemic cardiomyopathy (HCC)    a. EF 35-40% by cath in 2011 with normal cors b. s/p ICD implantation in 12/2014 c. EF 25% by NST in 02/01/2016   Sarcoidosis    Sarcoidosis 02/08/2016    Past Surgical History:  Procedure Laterality Date   ABDOMINAL SURGERY     CARDIAC CATHETERIZATION N/A 02/08/2016   Procedure: Left Heart Cath and Coronary Angiography;  Surgeon: Corky Crafts, MD;  Location: [facility] INVASIVE CV LAB;  Service: Cardiovascular;  Laterality:  N/A;   CATARACT EXTRACTION Right 12/04/2018   CESAREAN SECTION     CHOLECYSTECTOMY     INSERTION OF ICD     a. s/p dual-chamber [employer] ICD 12/2014   PACEMAKER INSERTION     TUBAL LIGATION      Social History   Socioeconomic History   Marital status: Single    Spouse name: Not on file   Number of children: 5   Years of education: Not on file   Highest education  level: Not on file  Occupational History   Not on file  Social Needs   Financial resource strain: Not on file   Food insecurity:    Worry: Not on file    Inability: Not on file   Transportation needs:    Medical: Not on file    Non-medical: Not on file  Tobacco Use   Smoking status: Former Smoker    Packs/day: 1.00    Years: 20.00    Pack years: 20.00    Types: Cigarettes    Last attempt to quit: 2016    Years since quitting: 4.3   Smokeless tobacco: Never Used  Substance and Sexual Activity   Alcohol use: No    Alcohol/week: 0.0 standard drinks   Drug use: No   Sexual activity: Not on file  Lifestyle   Physical activity:    Days per week: Not on file    Minutes per session: Not on file   Stress: Not on file  Relationships   Social connections:    Talks on phone: Not on file    Gets together: Not on file    Attends religious service: Not on file    Active member of club or organization: Not on file    Attends meetings of clubs or organizations: Not on file    Relationship status: Not on file   Intimate partner violence:    Fear of current or ex partner: Not on file    Emotionally abused: Not on file    Physically abused: Not on file    Forced sexual activity: Not on file  Other Topics Concern   Not on file  Social History Narrative   Not on file    Current Outpatient Medications on File Prior to Visit  Medication Sig Dispense Refill   aspirin 81 MG chewable tablet Chew 81 mg by mouth daily.     carvedilol (COREG) 25 MG tablet Take 50 mg by mouth 2 (two) times daily with a meal.     empagliflozin (JARDIANCE) 25 MG TABS tablet Take by mouth daily.     fluconazole (DIFLUCAN) 150 MG tablet as needed.     fluticasone (FLONASE) 50 MCG/ACT nasal spray Place 1 spray daily into both nostrils. (Patient taking differently: Place 1 spray into both nostrils as needed. ) 16 g 2   folic acid (FOLVITE) 1 MG tablet Take 3 mg by mouth daily.       furosemide (LASIX) 20 MG tablet Take 40 mg by mouth as needed.      ibuprofen (ADVIL,MOTRIN) 800 MG tablet Take 1 tablet (800 mg total) every 8 (eight) hours as needed by mouth (pain). 21 tablet 0   Insulin Detemir (LEVEMIR FLEXTOUCH) 100 UNIT/ML Pen Inject 40 Units as directed at bedtime.     insulin lispro (HUMALOG) 100 UNIT/ML injection Inject 1-10 Units into the skin 3 (three) times daily before meals.     ivabradine (CORLANOR) 7.5 MG TABS tablet Take 7.5 mg by mouth  2 (two) times daily with a meal.     metFORMIN (GLUCOPHAGE) 500 MG tablet Take 2,000 mg by mouth daily with breakfast.     methotrexate 2.5 MG tablet Take 15 mg by mouth once a week.      montelukast (SINGULAIR) 10 MG tablet Take 10 mg by mouth at bedtime.     omeprazole (PRILOSEC) 20 MG capsule Take 20 mg by mouth as needed.     potassium chloride (K-DUR) 10 MEQ tablet Take 10 mEq by mouth daily.     sacubitril-valsartan (ENTRESTO) 97-103 MG Take 1 tablet by mouth 2 (two) times daily. 60 tablet 5   sertraline (ZOLOFT) 50 MG tablet Take 50 mg by mouth daily.     simvastatin (ZOCOR) 5 MG tablet Take 1 tablet (5 mg total) by mouth at bedtime for 30 days. 30 tablet 3   sitaGLIPtin (JANUVIA) 100 MG tablet Take 100 mg by mouth daily.     spironolactone (ALDACTONE) 25 MG tablet Take 1 tablet (25 mg total) by mouth every morning. 90 tablet 1   Vitamin D, Ergocalciferol, (DRISDOL) 1.25 MG (50000 UT) CAPS capsule Take 50,000 Units by mouth once a week.     No current facility-administered medications on file prior to visit.     Review of Systems  Constitution: Negative for decreased appetite, malaise/fatigue, weight gain and weight loss.  Eyes: Negative for blurred vision.  Cardiovascular: Positive for dyspnea on exertion (stable; minimal) and palpitations. Negative for chest pain, claudication, leg swelling and orthopnea.  Respiratory: Negative for cough and wheezing.   Endocrine: Negative for cold intolerance and  heat intolerance.  Hematologic/Lymphatic: Does not bruise/bleed easily.  Musculoskeletal: Negative for myalgias.  Neurological: Negative for dizziness, focal weakness and headaches.  Psychiatric/Behavioral: Negative for suicidal ideas.  All other systems reviewed and are negative.     Objective  Blood pressure 128/66, pulse 82, height  (1.702 m), weight 297 lb (134.7 kg). Body mass index is 46.52 kg/m.   Physical exam not performed or limited due to virtual visit.  Patient appeared to be in no distress, Neck was supple, respiration was not labored.  Please see exam details from prior visit is as below.     Physical Exam  Constitutional: She is oriented to person, place, and time. Vital signs are normal. She appears well-developed and well-nourished.  HENT:  Head: Normocephalic and atraumatic.  Neck: Normal range of motion.  Cardiovascular: Normal rate, regular rhythm, normal heart sounds and intact distal pulses.  ICD placement scar  Pulmonary/Chest: Effort normal and breath sounds normal. No accessory muscle usage. No respiratory distress.  Abdominal: Soft. Bowel sounds are normal.  Musculoskeletal: Normal range of motion.  Neurological: She is alert and oriented to person, place, and time.  Skin: Skin is warm, dry and intact.  Psychiatric: She has a normal mood and affect.  Vitals reviewed.  Radiology: No results found.  Laboratory examination:    CMP Latest Ref Rng & Units 02/24/2018 01/06/2018 07/29/2017  Glucose 65 - 99 mg/dL 295(A) 213(Y) 865(H)  BUN 6 - 20 mg/dL Creatinine 0.44 - 1.00 mg/dL 8.46 9.62 9.52  Sodium 135 - 145 mmol/L 138 137 140  Potassium 3.5 - 5.1 mmol/L 4.1 3.9 3.4(L)  Chloride 101 - 111 mmol/L 107 102 105  CO2 22 - 32 mmol/L Calcium 8.9 - 10.3 mg/dL 9.3 8.4(X) 9.2  Total Protein 6.5 - 8.1 g/dL - 7.5 6.8  Total Bilirubin 0.3 - 1.2  mg/dL - 0.4 5.0(H)  Alkaline Phos 38 - 126 U/L - 79 90  AST 15 - 41 U/L - 25 22  ALT 14 - 54  U/L - 23 23   CBC Latest Ref Rng & Units 01/06/2018 07/29/2017 07/18/2017  WBC 4.0 - 10.5 K/uL 10.4 8.7 9.3  Hemoglobin 12.0 - 15.0 g/dL 22.5 11.0(L) 11.3(L)  Hematocrit 36.0 - 46.0 % 39.8 34.7(L) 34.9(L)  Platelets 150 - 400 K/uL 269 224 208   Lipid Panel     Component Value Date/Time   CHOL 165 02/09/2016 0700   TRIG 94 02/09/2016 0700   HDL 64 02/09/2016 0700   CHOLHDL 2.6 02/09/2016 0700   VLDL 19 02/09/2016 0700   LDLCALC 82 02/09/2016 0700   HEMOGLOBIN A1C No results found for: HGBA1C, MPG TSH No results for input(s): TSH in the last 8760 hours.  Cardiac Studies:   Remote 4.22.20: 1 SVT/8 secs, normal function. Battery longevity is 8 years. RA pacing is 1 %, RV pacing is 99 %, and LV pacing is 99 %.  Echocardiogram 08/26/2018: Left ventricle cavity is normal in size. Mild concentric hypertrophy of the left ventricle. Visual EF is 45-50%. Abnormal septal wall motion due to right ventricle pacemaker. Doppler evidence of grade II (pseudonormal) diastolic dysfunction, elevated LAP. Calculated EF 40%. Device lead seen in right ventricle.  Left atrial cavity is moderately dilated. Mild mitral regurgitation. Trace tricuspid regurgitation. Inadequate tricuspid regurgitation jet to estimate pulmonary artery pressure IVC is normal with blunted respiratory response. Estimated RA pressure 8 mmHg. Compared to previous cath on 12/27/2017, LVEF is improved. No significant pericardial effusion seen.  Nuclear stress test 02/01/2016: No significant ST segment changes or arrhythmias were noted during stress  Negative ETT for ischemia at workload achieved  No significant chest pain symptoms reported  No significant arrhythmia noted  nuclear images report to follow  Right heart catheterization and myocardial biopsy 07/17/2016: Mild pulmonary hypertension, mildly elevated pulmonary capillary wedge pressure, biopsy negative for sarcoid.  Assessment   Chronic systolic CHF (congestive  heart failure) (HCC) - Plan: PCV ECHOCARDIOGRAM COMPLETE  Nonischemic cardiomyopathy (HCC)  Cardiac defibrillator in situ  Type 2 diabetes mellitus without complication, with long-term current use of insulin (HCC)  Benign essential HTN  Paroxysmal SVT (supraventricular tachycardia) (HCC)    Recommendations:   Patient is presently doing well. It does appear that patient recently had heart failure exacerbation; however, symptoms have now improved. She admits to having been noncompliant with her diet prior to this, but has now been eating better and walking more and is overall feeling much better. She does not appear to be in any distress today. Blood pressure is well controlled. Tolerating medications well. Will repeat echocardiogram before her next office visit for surveillance.   In regard to SVT, has had brief episodes on remote transmission with occasional symptoms of heart racing. She is on high dose beta blocker. Feel that initiation of Sotalol would be her best option. Will arrange for in office sotalol initiation appt.  She is now tolerating Simvastatin well. I have reviewed her recent labs, which all appear to be stable. Lipids are very well controlled. Diabetes control is improving now that she is off prednisone.   In view of her sarcoidosis and cardiomyopathy, patient is high risk for complications associated with COVID-19. She is very concerned about this due to her medical issues. Feel that she would benefit from limiting her exposure by possibly not being required to provide direct patient care until risk  of infection has decreased. Will provide work letter for this. I will see her back in 3 months or sooner if problems.  Toniann FailAshton Haynes Alireza Pollack, MSN, APRN, FNP-C [facility] Office: [phone] Fax: [phone]

## 2019-03-11 ENCOUNTER — Encounter: Payer: Self-pay | Admitting: Cardiology

## 2019-03-11 MED ORDER — FUROSEMIDE 40 MG PO TABS: 40.0000 mg | ORAL_TABLET | ORAL | 1 refills | Status: DC | PRN

## 2019-03-17 ENCOUNTER — Encounter: Payer: Self-pay | Admitting: Cardiology

## 2019-03-26 DIAGNOSIS — Z95810 Presence of automatic (implantable) cardiac defibrillator: Secondary | ICD-10-CM | POA: Diagnosis not present

## 2019-03-26 DIAGNOSIS — Z4502 Encounter for adjustment and management of automatic implantable cardiac defibrillator: Secondary | ICD-10-CM | POA: Diagnosis not present

## 2019-03-26 DIAGNOSIS — I43 Cardiomyopathy in diseases classified elsewhere: Secondary | ICD-10-CM | POA: Diagnosis not present

## 2019-04-26 DIAGNOSIS — Z95810 Presence of automatic (implantable) cardiac defibrillator: Secondary | ICD-10-CM

## 2019-04-26 DIAGNOSIS — I428 Other cardiomyopathies: Secondary | ICD-10-CM | POA: Diagnosis not present

## 2019-04-26 DIAGNOSIS — Z4502 Encounter for adjustment and management of automatic implantable cardiac defibrillator: Secondary | ICD-10-CM

## 2019-04-30 ENCOUNTER — Telehealth: Payer: Self-pay

## 2019-04-30 NOTE — Telephone Encounter (Signed)
I had sent a message to felicia to schedule appt for sotalol initiation and when she had an appt scheduled I would send it. Not sure what happened with that. Any events on her ICD?

## 2019-04-30 NOTE — Telephone Encounter (Signed)
I am not sure, Alicia Cook was thinking about this, I would not change anything for now, but did not see her.

## 2019-04-30 NOTE — Telephone Encounter (Signed)
Anything with this ?

## 2019-04-30 NOTE — Telephone Encounter (Signed)
Pt called stating she has been having heart palps for the past two days

## 2019-04-30 NOTE — Telephone Encounter (Signed)
She says you were suppose to send it in and she pick it up and then come in the office but you never sent it

## 2019-05-01 NOTE — Telephone Encounter (Signed)
Please ask her

## 2019-05-01 NOTE — Telephone Encounter (Signed)
So what should I tell her about her palps ?

## 2019-05-03 ENCOUNTER — Encounter: Payer: Self-pay | Admitting: Cardiology

## 2019-05-05 NOTE — Telephone Encounter (Signed)
Her ICD transmission did not reveal any arrhythmias like SVT that she was previously having. Would hold off on making any changes for now.

## 2019-05-08 ENCOUNTER — Other Ambulatory Visit: Payer: Self-pay | Admitting: Cardiology

## 2019-05-13 ENCOUNTER — Other Ambulatory Visit: Payer: Self-pay

## 2019-05-13 DIAGNOSIS — I5022 Chronic systolic (congestive) heart failure: Secondary | ICD-10-CM

## 2019-05-13 MED ORDER — SIMVASTATIN 5 MG PO TABS: 5.0000 mg | ORAL_TABLET | Freq: Every day | ORAL | 3 refills | Status: DC

## 2019-05-27 DIAGNOSIS — Z4502 Encounter for adjustment and management of automatic implantable cardiac defibrillator: Secondary | ICD-10-CM | POA: Diagnosis not present

## 2019-05-27 DIAGNOSIS — Z95810 Presence of automatic (implantable) cardiac defibrillator: Secondary | ICD-10-CM | POA: Diagnosis not present

## 2019-05-27 DIAGNOSIS — I43 Cardiomyopathy in diseases classified elsewhere: Secondary | ICD-10-CM | POA: Diagnosis not present

## 2019-06-13 ENCOUNTER — Other Ambulatory Visit: Payer: Self-pay

## 2019-06-13 ENCOUNTER — Ambulatory Visit (INDEPENDENT_AMBULATORY_CARE_PROVIDER_SITE_OTHER): Payer: Medicaid Other | Admitting: Cardiology

## 2019-06-13 ENCOUNTER — Encounter: Payer: Self-pay | Admitting: Cardiology

## 2019-06-13 VITALS — BP 132/71 | HR 80 | Ht 67.0 in | Wt 289.0 lb

## 2019-06-13 DIAGNOSIS — Z4502 Encounter for adjustment and management of automatic implantable cardiac defibrillator: Secondary | ICD-10-CM

## 2019-06-13 DIAGNOSIS — Z95810 Presence of automatic (implantable) cardiac defibrillator: Secondary | ICD-10-CM | POA: Diagnosis not present

## 2019-06-13 DIAGNOSIS — I10 Essential (primary) hypertension: Secondary | ICD-10-CM | POA: Diagnosis not present

## 2019-06-13 DIAGNOSIS — I5022 Chronic systolic (congestive) heart failure: Secondary | ICD-10-CM

## 2019-06-13 DIAGNOSIS — I428 Other cardiomyopathies: Secondary | ICD-10-CM | POA: Diagnosis not present

## 2019-06-13 HISTORY — DX: Encounter for adjustment and management of automatic implantable cardiac defibrillator: Z45.02

## 2019-06-13 NOTE — Progress Notes (Signed)
Primary Physician/Referring:  Amelia Joousins, Gianelle A, FNP  Patient ID: Alicia Cook, female    DOB: July 29, 1966, 53 y.o.   MRN: [id number]  Chief Complaint  Patient presents with  . Cardiomyopathy  . Pacemaker Check    HPI: Alicia Cook  is a 53 y.o. 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) and severe LV systolic dysfunction. Cardiac MRI performed in 2011 and cardiac PET scan in August 2017 does not appear to be consistent with cardiac sarcoidosis.  She also has uncontrolled diabetes, morbid obesity, mild sleep apnea but not tolerating CPAP, systemic hypertension. Last episode of acute decompensated heart failure was in March 2016 when she underwent CRT-D implant, and 2nd exacerbation was in April 2017.  This is a 3 month follow up for cardiomyopathy. Since being on aggressive medical management, she has had significant improvement in symptoms and also improvement in LVEF to 40% on last echo in Oct 2019.   She is now here for 3 month follow up and in office pacemaker check. Previously noted episodes of SVT on remote transmissions have resolved. She does continue to have occasional fluttering in her chest that was evaluated by GI and is found to be related to spasms. She has not had any further chest pain. Dyspnea on exertion is stable.   She is currently working with weight [facility] wellness center and has been losing weight with this.   Lipids are well controlled. She continues to work, but has been taken out of direct patient care. She continues to be worried about direct patient care.      Past Medical History:  Diagnosis Date  . Cataract   . CHF (congestive heart failure) (HCC)   . Depression   . Diabetes mellitus without complication (HCC)   . Encounter for adjustment of biventricular implantable cardioverter-defibrillator (ICD)  06/13/2019  . Hypertension   . ICD: Cardiac defibrillator in situ 01/13/2015   [employer] Bi-V ICD (Dynagen X4 CRT-D) 01/13/2015 at [facility]  Scheduled Remote ICD check 4.22.20: 1 SVT/8 secs, normal function. Battery longevity is 8 years. RA pacing is 1 %, RV pacing is 99 %, and LV pacing is 99 %.  . Nonischemic cardiomyopathy (HCC)    a. EF 35-40% by cath in 2011 with normal cors b. s/p ICD implantation in 12/2014 c. EF 25% by NST in 02/01/2016  . Sarcoidosis   . Sarcoidosis 02/08/2016    Past Surgical History:  Procedure Laterality Date  . ABDOMINAL SURGERY    . CARDIAC CATHETERIZATION N/A 02/08/2016   Procedure: Left Heart Cath and Coronary Angiography;  Surgeon: Corky CraftsJayadeep S Varanasi, MD;  [facility];  Service: Cardiovascular;  Laterality: N/A;  . CATARACT EXTRACTION Right 12/04/2018  . CESAREAN SECTION    . CHOLECYSTECTOMY    . INSERTION OF ICD     a. s/p dual-chamber [employer] ICD 12/2014  . PACEMAKER INSERTION    . TUBAL LIGATION      Social History   Socioeconomic History  . Marital status: Single    Spouse name: Not on file  . Number of children: 5  . Years of education: Not on file  . Highest education level: Not on file  Occupational History  . Not on file  Social Needs  . Financial resource strain: Not on file  . Food insecurity    Worry: Not on file  Inability: Not on file  . Transportation needs    Medical: Not on file    Non-medical: Not on file  Tobacco Use  . Smoking status: Former Smoker    Packs/day: 1.00    Years: 20.00    Pack years: 20.00    Types: Cigarettes    Quit date: 2016    Years since quitting: 4.6  . Smokeless tobacco: Never Used  Substance and Sexual Activity  . Alcohol use: No    Alcohol/week: 0.0 standard drinks  . Drug use: No  . Sexual activity: Not on file  Lifestyle  . Physical activity    Days per week: Not on file    Minutes per session: Not on file  . Stress: Not  on file  Relationships  . Social Herbalist on phone: Not on file    Gets together: Not on file    Attends religious service: Not on file    Active member of club or organization: Not on file    Attends meetings of clubs or organizations: Not on file    Relationship status: Not on file  . Intimate partner violence    Fear of current or ex partner: Not on file    Emotionally abused: Not on file    Physically abused: Not on file    Forced sexual activity: Not on file  Other Topics Concern  . Not on file  Social History Narrative  . Not on file    Current Outpatient Medications on File Prior to Visit  Medication Sig Dispense Refill  . aspirin 81 MG chewable tablet Chew 81 mg by mouth daily.    . carvedilol (COREG) 25 MG tablet Take 25 mg by mouth 2 (two) times daily with a meal.     . empagliflozin (JARDIANCE) 25 MG TABS tablet Take by mouth daily.    . fluconazole (DIFLUCAN) 150 MG tablet as needed.    . fluticasone (FLONASE) 50 MCG/ACT nasal spray Place 1 spray daily into both nostrils. (Patient taking differently: Place 1 spray into both nostrils as needed. ) 16 g 2  . folic acid (FOLVITE) 1 MG tablet Take 3 mg by mouth daily.     . furosemide (LASIX) 40 MG tablet TAKE 1 TABLET (40 MG TOTAL) BY MOUTH AS NEEDED (DAILY FOR SHORTNESS OF BREATH). 30 tablet 1  . ibuprofen (ADVIL,MOTRIN) 800 MG tablet Take 1 tablet (800 mg total) every 8 (eight) hours as needed by mouth (pain). 21 tablet 0  . Insulin Detemir (LEVEMIR FLEXTOUCH) 100 UNIT/ML Pen Inject 40 Units as directed at bedtime.    . insulin lispro (HUMALOG) 100 UNIT/ML injection Inject 1-10 Units into the skin 3 (three) times daily before meals.    . ivabradine (CORLANOR) 7.5 MG TABS tablet Take 7.5 mg by mouth 2 (two) times daily with a meal.    . metFORMIN (GLUCOPHAGE) 500 MG tablet Take 2,000 mg by mouth daily with breakfast.    . methotrexate 2.5 MG tablet Take 15 mg by mouth once a week.     . montelukast (SINGULAIR)  10 MG tablet Take 10 mg by mouth at bedtime.    . potassium chloride (K-DUR) 10 MEQ tablet Take 10 mEq by mouth daily.    . sacubitril-valsartan (ENTRESTO) 97-103 MG Take 1 tablet by mouth 2 (two) times daily. 60 tablet 5  . sertraline (ZOLOFT) 50 MG tablet Take 50 mg by mouth daily.    . simvastatin (ZOCOR) 5 MG tablet Take  1 tablet (5 mg total) by mouth at bedtime. 30 tablet 3  . sitaGLIPtin (JANUVIA) 100 MG tablet Take 100 mg by mouth daily.    Marland Kitchen spironolactone (ALDACTONE) 25 MG tablet Take 1 tablet (25 mg total) by mouth every morning. 90 tablet 1  . Vitamin D, Ergocalciferol, (DRISDOL) 1.25 MG (50000 UT) CAPS capsule Take 50,000 Units by mouth once a week.     No current facility-administered medications on file prior to visit.     Review of Systems  Constitution: Negative for decreased appetite, malaise/fatigue, weight gain and weight loss.  Eyes: Negative for blurred vision.  Cardiovascular: Positive for dyspnea on exertion (stable; minimal). Negative for chest pain, claudication, leg swelling, orthopnea and palpitations.  Respiratory: Negative for cough and wheezing.   Endocrine: Negative for cold intolerance and heat intolerance.  Hematologic/Lymphatic: Does not bruise/bleed easily.  Musculoskeletal: Negative for myalgias.  Neurological: Negative for dizziness, focal weakness and headaches.  Psychiatric/Behavioral: Negative for suicidal ideas.  All other systems reviewed and are negative.     Objective  Blood pressure 132/71, pulse 80, height 5\' 7"  (1.702 m), weight 289 lb (131.1 kg), SpO2 97 %. Body mass index is 45.26 kg/m.      Physical Exam  Constitutional: She is oriented to person, place, and time. Vital signs are normal. She appears well-developed and well-nourished.  HENT:  Head: Normocephalic and atraumatic.  Neck: Normal range of motion.  Cardiovascular: Normal rate, regular rhythm, normal heart sounds and intact distal pulses.  ICD placement scar   Pulmonary/Chest: Effort normal and breath sounds normal. No accessory muscle usage. No respiratory distress.  Abdominal: Soft. Bowel sounds are normal.  Musculoskeletal: Normal range of motion.  Neurological: She is alert and oriented to person, place, and time.  Skin: Skin is warm, dry and intact.  Psychiatric: She has a normal mood and affect.  Vitals reviewed.  Radiology: No results found.  Laboratory examination:    CMP Latest Ref Rng & Units 02/24/2018 01/06/2018 07/29/2017  Glucose 65 - 99 mg/dL 474(Q) 595(G) 387(F)  BUN 6 - 20 mg/dL 10 18 11   Creatinine 0.44 - 1.00 mg/dL 6.43 3.29 5.18  Sodium 135 - 145 mmol/L 138 137 140  Potassium 3.5 - 5.1 mmol/L 4.1 3.9 3.4(L)  Chloride 101 - 111 mmol/L 107 102 105  CO2 22 - 32 mmol/L 24 27 27   Calcium 8.9 - 10.3 mg/dL 9.3 8.4(Z) 9.2  Total Protein 6.5 - 8.1 g/dL - 7.5 6.8  Total Bilirubin 0.3 - 1.2 mg/dL - 0.4 6.6(A)  Alkaline Phos 38 - 126 U/L - 79 90  AST 15 - 41 U/L - 25 22  ALT 14 - 54 U/L - 23 23   CBC Latest Ref Rng & Units 01/06/2018 07/29/2017 07/18/2017  WBC 4.0 - 10.5 K/uL 10.4 8.7 9.3  Hemoglobin 12.0 - 15.0 g/dL 63.0 11.0(L) 11.3(L)  Hematocrit 36.0 - 46.0 % 39.8 34.7(L) 34.9(L)  Platelets 150 - 400 K/uL 269 224 208   Lipid Panel     Component Value Date/Time   CHOL 165 02/09/2016 0700   TRIG 94 02/09/2016 0700   HDL 64 02/09/2016 0700   CHOLHDL 2.6 02/09/2016 0700   VLDL 19 02/09/2016 0700   LDLCALC 82 02/09/2016 0700   HEMOGLOBIN A1C No results found for: HGBA1C, MPG TSH No results for input(s): TSH in the last 8760 hours.  Cardiac Studies:   Remote 4.22.20: 1 SVT/8 secs, normal function. Battery longevity is 8 years. RA pacing is 1 %, RV  pacing is 99 %, and LV pacing is 99 %.  Echocardiogram 08/26/2018: Left ventricle cavity is normal in size. Mild concentric hypertrophy of the left ventricle. Visual EF is 45-50%. Abnormal septal wall motion due to right ventricle pacemaker. Doppler evidence of grade II  (pseudonormal) diastolic dysfunction, elevated LAP. Calculated EF 40%. Device lead seen in right ventricle.  Left atrial cavity is moderately dilated. Mild mitral regurgitation. Trace tricuspid regurgitation. Inadequate tricuspid regurgitation jet to estimate pulmonary artery pressure IVC is normal with blunted respiratory response. Estimated RA pressure 8 mmHg. Compared to previous cath on 12/27/2017, LVEF is improved. No significant pericardial effusion seen.  Nuclear stress test 02/01/2016: No significant ST segment changes or arrhythmias were noted during stress  Negative ETT for ischemia at workload achieved  No significant chest pain symptoms reported  No significant arrhythmia noted  nuclear images report to follow  Right heart catheterization and myocardial biopsy 07/17/2016: Mild pulmonary hypertension, mildly elevated pulmonary capillary wedge pressure, biopsy negative for sarcoid.  Assessment     ICD-10-CM   1. Encounter for adjustment of biventricular implantable cardioverter-defibrillator (ICD)  Z45.02   2. ICD: [employer] Bi-V ICD (Dynagen X4 CRT-D) MRI compatible ICD in situ  Z95.810   3. Chronic systolic CHF (congestive heart failure) (HCC)  I50.22   4. Nonischemic cardiomyopathy (HCC)  I42.8   5. Benign essential HTN  I10      Scheduled Remote ICD check 6.24.20: No VHR episodes. RA pacing is 1 %, RV pacing is 100 %, and LV pacing is 100 %. Health trends are normal. Longevity > 5 years.  (f/u impedance and threshold (850 -> 1274 _>1156 Ohm and 1.3 V ->0.9->2.2 ( March, June and August 2020).  Scheduled In office ICD check 06/13/2019:   impedance and threshold (850 -> 1274 _>1156 Ohm and 1.3 V ->0.9->2.2 ( March, June and August 2020).  Safety margin for LV pacing increased from 2.5 also 2.8 V in view of this.  Patient has had one brief episode of atrial tachycardia with one-to-one conduction for 6 seconds on 07/13/2018, brief NSVT on 07/08/2018.  Since last  transmission.  No other significant arrhythmias.  Otherwise normal functioning ICD.  Longevity greater than5 years.  Recommendations:   Patient is presently doing well. No evidence of decompensated heart failure. She is on appropriate medical therapy. Blood pressure is well controlled. We had previously considered starting sotalol therapy for brief episodes of SVT correlating with heart racing; however, she has not recently. Last episode was in Sept 2019. Will hold off on this for now. ICD is functioning appropriately.   I am very pleased with her efforts towards weight loss and what she has been able to achieve thus far. She has made significant diet and lifestyle changes. I have congratulated her on this and provided positive reinforcement. Hopefully this will help with controlling her diabetes better. No changes were made to medications today. She was supposed to have repeat echocardiogram; however, was missed. She will reschedule this. I will see her back in 3 months for follow up, but encouraged sooner follow up if needed.    *I have discussed this case with Dr. Jacinto HalimGanji and he personally examined the patient and participated in formulating the plan.*   Toniann FailAshton Haynes Saleah Rishel, MSN, APRN, FNP-C [facility] Office: [phone] Fax: [phone]

## 2019-06-16 ENCOUNTER — Other Ambulatory Visit: Payer: Self-pay

## 2019-06-16 ENCOUNTER — Encounter: Payer: Self-pay | Admitting: Cardiology

## 2019-06-16 MED ORDER — IVABRADINE HCL 7.5 MG PO TABS: 7.5000 mg | ORAL_TABLET | Freq: Two times a day (BID) | ORAL | 0 refills | Status: DC

## 2019-06-27 ENCOUNTER — Ambulatory Visit (INDEPENDENT_AMBULATORY_CARE_PROVIDER_SITE_OTHER): Payer: Medicaid Other

## 2019-06-27 ENCOUNTER — Other Ambulatory Visit: Payer: Self-pay

## 2019-06-27 DIAGNOSIS — I5022 Chronic systolic (congestive) heart failure: Secondary | ICD-10-CM

## 2019-06-30 ENCOUNTER — Telehealth: Payer: Self-pay

## 2019-06-30 NOTE — Telephone Encounter (Signed)
Pt says that her chest feels kind of full and she had a fever; she doesn't know if it has anything to do with her heart

## 2019-06-30 NOTE — Telephone Encounter (Signed)
Pt aware and will let us know

## 2019-06-30 NOTE — Telephone Encounter (Signed)
Not sure, any shortness of breath or changes to her weight. She needs to get tested. 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

## 2019-06-30 NOTE — Telephone Encounter (Signed)
Pt called and wants to know her results from echo; What should I tell her Thanks

## 2019-07-11 ENCOUNTER — Other Ambulatory Visit: Payer: Self-pay | Admitting: Cardiology

## 2019-07-25 ENCOUNTER — Other Ambulatory Visit: Payer: Self-pay

## 2019-07-25 MED ORDER — IVABRADINE HCL 7.5 MG PO TABS: 7.5000 mg | ORAL_TABLET | Freq: Two times a day (BID) | ORAL | 0 refills | Status: DC

## 2019-08-02 ENCOUNTER — Other Ambulatory Visit: Payer: Self-pay | Admitting: Cardiology

## 2019-08-02 DIAGNOSIS — I5022 Chronic systolic (congestive) heart failure: Secondary | ICD-10-CM

## 2019-09-15 ENCOUNTER — Other Ambulatory Visit: Payer: Self-pay

## 2019-09-15 ENCOUNTER — Ambulatory Visit: Payer: Medicaid Other | Admitting: Cardiology

## 2019-09-15 ENCOUNTER — Encounter: Payer: Self-pay | Admitting: Cardiology

## 2019-09-15 VITALS — BP 117/67 | HR 94 | Temp 97.9°F | Ht 67.0 in | Wt 294.6 lb

## 2019-09-15 DIAGNOSIS — I5022 Chronic systolic (congestive) heart failure: Secondary | ICD-10-CM

## 2019-09-15 DIAGNOSIS — Z794 Long term (current) use of insulin: Secondary | ICD-10-CM

## 2019-09-15 DIAGNOSIS — I10 Essential (primary) hypertension: Secondary | ICD-10-CM

## 2019-09-15 DIAGNOSIS — I428 Other cardiomyopathies: Secondary | ICD-10-CM

## 2019-09-15 DIAGNOSIS — E119 Type 2 diabetes mellitus without complications: Secondary | ICD-10-CM

## 2019-09-15 MED ORDER — FUROSEMIDE 40 MG PO TABS: 80.0000 mg | ORAL_TABLET | ORAL | 3 refills | Status: DC | PRN

## 2019-09-15 NOTE — Progress Notes (Signed)
Primary Physician/Referring:  Henderson Baltimore, FNP  Patient ID: Alicia Cook, female    DOB: [birth year], 53 y.o.   MRN: [id number]  Chief Complaint  Patient presents with  . Cardiomyopathy  . Hypertension    HPI: Alicia Cook  is a 53 y.o. 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 Cardiac MRI performed in 2011 and cardiac PET scan in August 2017 does not appear to be consistent with cardiac sarcoidosis.  She also has uncontrolled diabetes, morbid obesity, mild sleep apnea but not tolerating CPAP, systemic hypertension. Last episode of acute decompensated heart failure was in March 2016 when she underwent CRT-D implant, and 2nd exacerbation was in April 2017.  This is a 3 month follow up for cardiomyopathy. Since being on aggressive medical management, she has had significant improvement in symptoms and also improvement in LVEF to 40% on last echo in Oct 2019.   She is now here for 3 month follow up. States that she is doing well, she does admit to not being as compliant for the last few months with her diet. She is drinking more sodas and has since had to start taking 80 mg of Lasix every other day to get weight off. She is also having fatigue with walking any significant distance. Dyspnea on exertion is overall stable.    Palpitations have resolved. Previously noted episodes of SVT on remote transmissions have resolved.   She is currently working with weight loss and wellness center and had previously lost weight; however, weight has been unchanged the last few months.   Lipids are well controlled. She continues to work, but has been taken out of direct patient care.     Past Medical History:  Diagnosis Date  . Cataract   . CHF (congestive heart failure) ([place])   . Depression   . Diabetes  mellitus without complication ([place])   . Encounter for adjustment of biventricular implantable cardioverter-defibrillator (ICD) 06/13/2019  . Hypertension   . ICD: Cardiac defibrillator in situ 01/13/2015   [employer] Bi-V ICD (Dynagen X4 CRT-D) 01/13/2015 at [facility] - MRI Compatible  Scheduled Remote ICD check 4.22.20: 1 SVT/8 secs, normal function. Battery longevity is 8 years. RA pacing is 1 %, RV pacing is 99 %, and LV pacing is 99 %.  . Nonischemic cardiomyopathy ([place])    a. EF 35-40% by cath in 2011 with normal cors b. s/p ICD implantation in 12/2014 c. EF 25% by NST in 02/01/2016  . Sarcoidosis   . Sarcoidosis 02/08/2016    Past Surgical History:  Procedure Laterality Date  . ABDOMINAL SURGERY    . CARDIAC CATHETERIZATION N/A 02/08/2016   Procedure: Left Heart Cath and Coronary Angiography;  Surgeon: Jettie Booze, MD;  Location: [place] [facility];  Service: Cardiovascular;  Laterality: N/A;  . CATARACT EXTRACTION Right 12/04/2018  . CESAREAN SECTION    . CHOLECYSTECTOMY    . INSERTION OF ICD     a. s/p dual-chamber [employer] ICD 12/2014  . PACEMAKER INSERTION    . TUBAL LIGATION      Social History   Socioeconomic History  . Marital status: Single    Spouse name: Not on file  . Number of children: 5  . Years of education: Not on file  . Highest education level: Not on file  Occupational History  . Not on file  Social Needs  . Financial resource strain: Not on file  . Food insecurity    Worry: Not on file    Inability: Not on file  . Transportation needs    Medical: Not on file    Non-medical: Not on file  Tobacco Use  . Smoking status: Former Smoker    Packs/day: 1.00    Years: 20.00    Pack years: 20.00    Types: Cigarettes    Quit date: 2016    Years since quitting: 4.8  . Smokeless tobacco: Never Used  Substance and Sexual Activity  . Alcohol use: No    Alcohol/week: 0.0 standard drinks  . Drug use: No  . Sexual activity:  Not on file  Lifestyle  . Physical activity    Days per week: Not on file    Minutes per session: Not on file  . Stress: Not on file  Relationships  . Social Musicianconnections    Talks on phone: Not on file    Gets together: Not on file    Attends religious service: Not on file    Active member of club or organization: Not on file    Attends meetings of clubs or organizations: Not on file    Relationship status: Not on file  . Intimate partner violence    Fear of current or ex partner: Not on file    Emotionally abused: Not on file    Physically abused: Not on file    Forced sexual activity: Not on file  Other Topics Concern  . Not on file  Social History Narrative  . Not on file    Current Outpatient Medications on File Prior to Visit  Medication Sig Dispense Refill  . aspirin 81 MG chewable tablet Chew 81 mg by mouth daily.    . carvedilol (COREG) 25 MG tablet Take 25 mg by mouth 2 (two) times daily with a meal.     . empagliflozin (JARDIANCE) 25 MG TABS tablet Take by mouth daily.    Marland Kitchen. ENTRESTO 97-103 MG TAKE 1 TABLET BY MOUTH TWICE A DAY 60 tablet 5  . folic acid (FOLVITE) 1 MG tablet Take 3 mg by mouth daily.     Marland Kitchen. ibuprofen (ADVIL,MOTRIN) 800 MG tablet Take 1 tablet (800 mg total) every 8 (eight) hours as needed by mouth (pain). 21 tablet 0  . Insulin Detemir (LEVEMIR FLEXTOUCH) 100 UNIT/ML Pen Inject 50 Units as directed at bedtime.     . insulin lispro (HUMALOG) 100 UNIT/ML injection Inject 1-10 Units into the skin 3 (three) times daily before meals.    . ivabradine (CORLANOR) 7.5 MG TABS tablet Take 1 tablet (7.5 mg total) by mouth 2 (two) times daily with a meal. 180 tablet 0  . metFORMIN (GLUCOPHAGE) 500 MG tablet Take 2,000 mg by mouth daily with breakfast.    . methotrexate 2.5 MG tablet Take 15 mg by mouth once a week.     . montelukast (SINGULAIR) 10 MG tablet Take 10 mg by mouth at bedtime.    . potassium chloride (K-DUR) 10 MEQ tablet Take 10 mEq by mouth daily.     . simvastatin (ZOCOR) 5 MG tablet TAKE 1 TABLET BY MOUTH EVERYDAY AT BEDTIME (Patient taking differently: Take 20 mg by mouth daily. ) 15 tablet 3  . sitaGLIPtin (JANUVIA) 100 MG tablet Take 100 mg by mouth daily.    Marland Kitchen. spironolactone (ALDACTONE) 25 MG tablet Take 1 tablet (25 mg total) by mouth every morning. 90 tablet  1  . Vitamin D, Ergocalciferol, (DRISDOL) 1.25 MG (50000 UT) CAPS capsule Take 50,000 Units by mouth once a week.    . sertraline (ZOLOFT) 50 MG tablet Take 50 mg by mouth daily.     No current facility-administered medications on file prior to visit.     Review of Systems  Constitution: Negative for decreased appetite, malaise/fatigue, weight gain and weight loss.  Eyes: Negative for blurred vision.  Cardiovascular: Positive for dyspnea on exertion (stable; minimal). Negative for chest pain, claudication, leg swelling, orthopnea and palpitations.  Respiratory: Negative for cough and wheezing.   Endocrine: Negative for cold intolerance and heat intolerance.  Hematologic/Lymphatic: Does not bruise/bleed easily.  Musculoskeletal: Negative for myalgias.  Neurological: Negative for dizziness, focal weakness and headaches.  Psychiatric/Behavioral: Negative for suicidal ideas.  All other systems reviewed and are negative.     Objective  Blood pressure 117/67, pulse 94, temperature 97.9 F (36.6 C), height 5\' 7"  (1.702 m), weight 294 lb 9.6 oz (133.6 kg), SpO2 99 %. Body mass index is 46.14 kg/m.      Physical Exam  Constitutional: She is oriented to person, place, and time. Vital signs are normal. She appears well-developed and well-nourished.  HENT:  Head: Normocephalic and atraumatic.  Neck: Normal range of motion.  Cardiovascular: Normal rate, regular rhythm, normal heart sounds and intact distal pulses.  ICD placement scar  Pulmonary/Chest: Effort normal and breath sounds normal. No accessory muscle usage. No respiratory distress.  Abdominal: Soft. Bowel sounds are  normal.  Musculoskeletal: Normal range of motion.  Neurological: She is alert and oriented to person, place, and time.  Skin: Skin is warm, dry and intact.  Psychiatric: She has a normal mood and affect.  Vitals reviewed.  Radiology: No results found.  Laboratory examination:    CMP Latest Ref Rng & Units 02/24/2018 01/06/2018 07/29/2017  Glucose 65 - 99 mg/dL 07/31/2017) 097(D) 532(D)  BUN 6 - 20 mg/dL 10 18 11   Creatinine 0.44 - 1.00 mg/dL 924(Q 6.83  Sodium 135 - 145 mmol/L 138 137 140  Potassium 3.5 - 5.1 mmol/L 4.1 3.9 3.4(L)  Chloride 101 - 111 mmol/L 107 102 105  CO2 22 - 32 mmol/L 24 27 27   Calcium 8.9 - 10.3 mg/dL 9.3 4.19) 9.2  Total Protein 6.5 - 8.1 g/dL - 7.5 6.8  Total Bilirubin 0.3 - 1.2 mg/dL - 0.4 6.22)  Alkaline Phos 38 - 126 U/L - 79 90  AST 15 - 41 U/L - 25 22  ALT 14 - 54 U/L - 23 23   CBC Latest Ref Rng & Units 01/06/2018 07/29/2017 07/18/2017  WBC 4.0 - 10.5 K/uL 10.4 8.7 9.3  Hemoglobin 12.0 - 15.0 g/dL 03/08/2018 11.0(L) 11.3(L)  Hematocrit 36.0 - 46.0 % 39.8 34.7(L) 34.9(L)  Platelets 150 - 400 K/uL 269 224 208   Lipid Panel     Component Value Date/Time   CHOL 165 02/09/2016 0700   TRIG 94 02/09/2016 0700   HDL 64 02/09/2016 0700   CHOLHDL 2.6 02/09/2016 0700   VLDL 19 02/09/2016 0700   LDLCALC 82 02/09/2016 0700   HEMOGLOBIN A1C No results found for: HGBA1C, MPG TSH No results for input(s): TSH in the last 8760 hours.  Cardiac Studies:   Remote 4.22.20: 1 SVT/8 secs, normal function. Battery longevity is 8 years. RA pacing is 1 %, RV pacing is 99 %, and LV pacing is 99 %.  Echocardiogram 06/27/2019: Left ventricle cavity is normal in size. Mild concentric hypertrophy of  the left ventricle. moderate global hypokinesis. Moderately depressed LV systolic function with visual LVEF 40-45%. Indeterminate diastolic function.  Left atrial cavity is mildly dilated. Moderate (Grade II) mitral regurgitation. Small pericardial effusion. No significant  change compared to previous study on 08/26/2018.  Nuclear stress test 02/01/2016: No significant ST segment changes or arrhythmias were noted during stress  Negative ETT for ischemia at workload achieved  No significant chest pain symptoms reported  No significant arrhythmia noted  nuclear images report to follow  Right heart catheterization and myocardial biopsy 07/17/2016: Mild pulmonary hypertension, mildly elevated pulmonary capillary wedge pressure, biopsy negative for sarcoid.  Assessment     ICD-10-CM   1. Nonischemic cardiomyopathy (HCC)  I42.8 EKG 12-Lead  2. Chronic systolic CHF (congestive heart failure) (HCC)  I50.22   3. Benign essential HTN  I10   4. Type 2 diabetes mellitus without complication, with long-term current use of insulin (HCC)  E11.9    Z79.4     EKG 09/15/2019: V paced rhythm, no further analysis.  Scheduled In office ICD check 06/13/2019:   impedance and threshold (850 -> 1274 _>1156 Ohm and 1.3 V ->0.9->2.2 ( March, June and August 2020).  Safety margin for LV pacing increased from 2.5 also 2.8 V in view of this.  Patient has had one brief episode of atrial tachycardia with one-to-one conduction for 6 seconds on 07/13/2018, brief NSVT on 07/08/2018.  Since last transmission.  No other significant arrhythmias.  Otherwise normal functioning ICD.  Longevity greater than5 years.  Recommendations:   Patient is here for 3 month office visit and follow up for CHF. She is overall doing well and maintaining weight loss; however, she is now having to take 80 mg of Lasix every other day that I suspect related to dietary noncompliance. Changes to her diet were extensively discussed. Advised her to only use 40 mg of lasix if she is able. I have discussed her recent echo that shows stable LVEF. Continue with present medications. Blood pressure is stable.  She does mention some worsening in her diabetes, also likely related to diet. Could consider Marcelline Deist both for  diabetes management and heart failure, but will defer to PCP if appropriate. I will see her back in 3 months for follow up, but encouraged her to contact me sooner if needed.    Toniann Fail, MSN, APRN, FNP-C [facility] Office: [phone] Fax: [phone]

## 2019-10-10 ENCOUNTER — Other Ambulatory Visit: Payer: Self-pay | Admitting: Cardiology

## 2019-10-10 MED ORDER — CARVEDILOL 25 MG PO TABS: 25.0000 mg | ORAL_TABLET | Freq: Two times a day (BID) | ORAL | 1 refills | Status: DC

## 2019-10-29 ENCOUNTER — Telehealth: Payer: Self-pay

## 2019-10-29 NOTE — Telephone Encounter (Signed)
Yes it appears to be safe by the information that we have. Her risk is high and feel that she would benefit from it. It is not a live vaccine

## 2019-10-29 NOTE — Telephone Encounter (Signed)
Can you call pt back please

## 2019-10-29 NOTE — Telephone Encounter (Signed)
Pt called wanting to know if its safe for her to get the vaccine sue to her underlying issues

## 2019-10-29 NOTE — Telephone Encounter (Signed)
Pt aware.//ah

## 2019-12-03 ENCOUNTER — Other Ambulatory Visit: Payer: Self-pay | Admitting: Cardiology

## 2019-12-03 DIAGNOSIS — E119 Type 2 diabetes mellitus without complications: Secondary | ICD-10-CM

## 2019-12-16 ENCOUNTER — Ambulatory Visit (INDEPENDENT_AMBULATORY_CARE_PROVIDER_SITE_OTHER): Payer: Medicaid Other | Admitting: Cardiology

## 2019-12-16 ENCOUNTER — Other Ambulatory Visit: Payer: Self-pay

## 2019-12-16 ENCOUNTER — Encounter: Payer: Self-pay | Admitting: Cardiology

## 2019-12-16 VITALS — BP 106/68 | HR 76 | Temp 97.3°F | Ht 67.0 in | Wt 293.0 lb

## 2019-12-16 DIAGNOSIS — Z95810 Presence of automatic (implantable) cardiac defibrillator: Secondary | ICD-10-CM

## 2019-12-16 DIAGNOSIS — I428 Other cardiomyopathies: Secondary | ICD-10-CM | POA: Diagnosis not present

## 2019-12-16 DIAGNOSIS — I10 Essential (primary) hypertension: Secondary | ICD-10-CM | POA: Diagnosis not present

## 2019-12-16 DIAGNOSIS — E1169 Type 2 diabetes mellitus with other specified complication: Secondary | ICD-10-CM

## 2019-12-16 DIAGNOSIS — I5022 Chronic systolic (congestive) heart failure: Secondary | ICD-10-CM

## 2019-12-16 DIAGNOSIS — E785 Hyperlipidemia, unspecified: Secondary | ICD-10-CM

## 2019-12-16 NOTE — Progress Notes (Signed)
Primary Physician/Referring:  [name], FNP  Patient ID: Alicia Cook, female    DOB: 1966-05-14, 54 y.o.   MRN: [id number]  Chief Complaint  Patient presents with  . Cardiomyopathy    HPI: Alicia Cook  is a 54 y.o. 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 Cardiac MRI performed in 2011 and cardiac PET scan in August 2017 does not appear to be consistent with cardiac sarcoidosis.  She also has uncontrolled diabetes, morbid obesity, mild sleep apnea but not tolerating CPAP, systemic hypertension. Last episode of acute decompensated heart failure was in March 2016 when she underwent CRT-D implant, and 2nd exacerbation was in April 2017.  This is a 3 month follow up for cardiomyopathy. Since being on aggressive medical management, she has had significant improvement in symptoms and also improvement in LVEF to 40% on last echo in Oct 2019.  Doing well without complaints.  She is considering bariatric procedure, gastric sleeve and is planning to meet with surgeon soon.  Weight has been stable.  Lipids are well controlled. She continues to work, but has been taken out of direct patient care.     Past Medical History:  Diagnosis Date  . Cataract   . CHF (congestive heart failure) (HCC)   . Depression   . Diabetes mellitus without complication (HCC)   . Encounter for adjustment of biventricular implantable cardioverter-defibrillator (ICD) 06/13/2019  . Hypertension   . ICD: Cardiac defibrillator in situ 01/13/2015   [employer] Bi-V ICD (Dynagen X4 CRT-D) 01/13/2015 at [facility] - MRI Compatible  Scheduled Remote ICD check 4.22.20: 1 SVT/8 secs, normal function. Battery longevity is 8 years. RA pacing is 1 %, RV pacing is 99 %, and LV pacing is 99 %.  . Nonischemic cardiomyopathy  (HCC)    a. EF 35-40% by cath in 2011 with normal cors b. s/p ICD implantation in 12/2014 c. EF 25% by NST in 02/01/2016  . Sarcoidosis   . Sarcoidosis 02/08/2016    Past Surgical History:  Procedure Laterality Date  . ABDOMINAL SURGERY    . CARDIAC CATHETERIZATION N/A 02/08/2016   Procedure: Left Heart Cath and Coronary Angiography;  Surgeon: Corky Crafts, MD;  Location: [facility] INVASIVE CV LAB;  Service: Cardiovascular;  Laterality: N/A;  . CATARACT EXTRACTION Right 12/04/2018  . CESAREAN SECTION    . CHOLECYSTECTOMY    . INSERTION OF ICD     a. s/p dual-chamber [employer] ICD 12/2014  . PACEMAKER INSERTION    . TUBAL LIGATION      Social History   Socioeconomic History  . Marital status: Single    Spouse name: Not on file  . Number of children: 5  . Years of education: Not on file  . Highest education level: Not on file  Occupational History  . Not on file  Tobacco Use  . Smoking status: Former Smoker    Packs/day: 1.00    Years: 20.00    Pack years: 20.00    Types: Cigarettes    Quit date: 2016    Years since quitting: 5.1  . Smokeless tobacco: Never Used  Substance and Sexual Activity  . Alcohol use: No    Alcohol/week: 0.0 standard drinks  . Drug use: No  . Sexual activity: Not on file  Other Topics Concern  . Not on file  Social History Narrative  .  Not on file   Social Determinants of Health   Financial Resource Strain:   . Difficulty of Paying Living Expenses: Not on file  Food Insecurity:   . Worried About Programme researcher, broadcasting/film/video in the Last Year: Not on file  . Ran Out of Food in the Last Year: Not on file  Transportation Needs:   . Lack of Transportation (Medical): Not on file  . Lack of Transportation (Non-Medical): Not on file  Physical Activity:   . Days of Exercise per Week: Not on file  . Minutes of Exercise per Session: Not on file  Stress:   . Feeling of Stress : Not on file  Social Connections:   . Frequency of Communication with  Friends and Family: Not on file  . Frequency of Social Gatherings with Friends and Family: Not on file  . Attends Religious Services: Not on file  . Active Member of Clubs or Organizations: Not on file  . Attends Banker Meetings: Not on file  . Marital Status: Not on file  Intimate Partner Violence:   . Fear of Current or Ex-Partner: Not on file  . Emotionally Abused: Not on file  . Physically Abused: Not on file  . Sexually Abused: Not on file    Current Outpatient Medications on File Prior to Visit  Medication Sig Dispense Refill  . aspirin 81 MG chewable tablet Chew 81 mg by mouth daily.    . carvedilol (COREG) 25 MG tablet Take 1 tablet (25 mg total) by mouth 2 (two) times daily with a meal. 90 tablet 1  . empagliflozin (JARDIANCE) 25 MG TABS tablet Take by mouth daily.    Marland Kitchen ENTRESTO 97-103 MG TAKE 1 TABLET BY MOUTH TWICE A DAY 60 tablet 5  . folic acid (FOLVITE) 1 MG tablet Take 3 mg by mouth daily.     . furosemide (LASIX) 40 MG tablet Take 2 tablets (80 mg total) by mouth as needed (daily for shortness of breath). 60 tablet 3  . ibuprofen (ADVIL,MOTRIN) 800 MG tablet Take 1 tablet (800 mg total) every 8 (eight) hours as needed by mouth (pain). 21 tablet 0  . Insulin Detemir (LEVEMIR FLEXTOUCH) 100 UNIT/ML Pen Inject 50 Units as directed at bedtime.     . insulin lispro (HUMALOG) 100 UNIT/ML injection Inject 1-10 Units into the skin 3 (three) times daily before meals.    . ivabradine (CORLANOR) 7.5 MG TABS tablet Take 1 tablet (7.5 mg total) by mouth 2 (two) times daily with a meal. 180 tablet 0  . metFORMIN (GLUCOPHAGE) 500 MG tablet Take 2,000 mg by mouth daily with breakfast.    . methotrexate 2.5 MG tablet Take 15 mg by mouth once a week.     . montelukast (SINGULAIR) 10 MG tablet Take 10 mg by mouth at bedtime.    . potassium chloride (K-DUR) 10 MEQ tablet Take 10 mEq by mouth daily.    . sertraline (ZOLOFT) 50 MG tablet Take 50 mg by mouth daily.    .  simvastatin (ZOCOR) 20 MG tablet Take 20 mg by mouth daily.    . sitaGLIPtin (JANUVIA) 100 MG tablet Take 100 mg by mouth daily.    Marland Kitchen spironolactone (ALDACTONE) 25 MG tablet Take 1 tablet (25 mg total) by mouth every morning. 90 tablet 1  . Vitamin D, Ergocalciferol, (DRISDOL) 1.25 MG (50000 UT) CAPS capsule Take 50,000 Units by mouth once a week.    . simvastatin (ZOCOR) 5 MG tablet TAKE 1  TABLET BY MOUTH EVERYDAY AT BEDTIME (Patient not taking: No sig reported) 15 tablet 3   No current facility-administered medications on file prior to visit.    Review of Systems  Constitution: Negative for decreased appetite, malaise/fatigue, weight gain and weight loss.  Eyes: Negative for blurred vision.  Cardiovascular: Positive for dyspnea on exertion (stable; minimal). Negative for chest pain, claudication, leg swelling, orthopnea and palpitations.  Respiratory: Negative for cough and wheezing.   Endocrine: Negative for cold intolerance and heat intolerance.  Hematologic/Lymphatic: Does not bruise/bleed easily.  Musculoskeletal: Negative for myalgias.  Neurological: Negative for dizziness, focal weakness and headaches.  Psychiatric/Behavioral: Negative for suicidal ideas.  All other systems reviewed and are negative.     Objective  Blood pressure 106/68, pulse 76, temperature (!) 97.3 F (36.3 C), height 5\' 7"  (1.702 m), weight 293 lb (132.9 kg), SpO2 100 %. Body mass index is 45.89 kg/m.      Physical Exam  Constitutional: She is oriented to person, place, and time. Vital signs are normal. She appears well-developed and well-nourished.  HENT:  Head: Normocephalic and atraumatic.  Cardiovascular: Normal rate, regular rhythm, normal heart sounds and intact distal pulses.  ICD placement scar  Pulmonary/Chest: Effort normal and breath sounds normal. No accessory muscle usage. No respiratory distress.  Abdominal: Soft. Bowel sounds are normal.  Musculoskeletal:        General: Normal range of  motion.     Cervical back: Normal range of motion.  Neurological: She is alert and oriented to person, place, and time.  Skin: Skin is warm, dry and intact.  Psychiatric: She has a normal mood and affect.  Vitals reviewed.  Radiology: No results found.  Laboratory examination:   External labs 12/17/2019: LDL Direct 63 <130 mg/dL  Total Cholesterol 12/19/2019 25 - 199 MG/DL  Triglycerides 629 10 - 150 MG/DL  HDL Cholesterol 55 35 - 135 MG/DL  Total Chol / HDL Cholesterol 2.3 <4.5   Non-HDL Cholesterol 69    Sodium 140 135 - 146 MMOL/L  Potassium 4.1 3.5 - 5.3 MMOL/L  Chloride 104 98 - 110 MMOL/L  CO2 29 23 - 30 MMOL/L  BUN 7 (L) 8 - 24 MG/DL  Glucose 528 (H)  Comment: Patients taking eltrombopag at doses >/= 100 mg daily may show falsely elevated values of 10% or greater. 70 - 99 MG/DL  Creatinine 413 0.5 - 1.5 MG/DL  Calcium 9.8 8.5 - 2.44 MG/DL  Total Protein 7.3  Comment: Patients taking eltrombopag at doses >/= 100 mg daily may show falsely elevated values of 10% or greater. 6 - 8.3 G/DL  Albumin  4.6 3.5 - 5 G/DL  Total Bilirubin 0.4  Comment: Patients taking eltrombopag at doses >/= 100 mg daily may show falsely elevated values of 10% or greater. 0.1 - 1.2 MG/DL  Alkaline Phosphatase 71 25 - 125 IU/L or U/L  AST (SGOT) 20 5 - 40 IU/L or U/L  ALT (SGPT) 24 5 - 50 IU/L or U/L  Anion Gap 7 4 - 14 MMOL/L  Est. GFR African American >=90       CMP Latest Ref Rng & Units 02/24/2018 01/06/2018 07/29/2017  Glucose 65 - 99 mg/dL 07/31/2017) 272(Z) 366(Y)  BUN 6 - 20 mg/dL 10 18 11   Creatinine 0.44 - 1.00 mg/dL 403(K 7.42  Sodium 135 - 145 mmol/L 138 137 140  Potassium 3.5 - 5.1 mmol/L 4.1 3.9 3.4(L)  Chloride 101 - 111 mmol/L 107 102 105  CO2 22 -  32 mmol/L 24 27 27   Calcium 8.9 - 10.3 mg/dL 9.3 8.6(L) 9.2  Total Protein 6.5 - 8.1 g/dL - 7.5 6.8  Total Bilirubin 0.3 - 1.2 mg/dL - 0.4 0.2(L)  Alkaline Phos 38 - 126 U/L - 79 90  AST 15 - 41 U/L - 25 22  ALT 14 - 54 U/L - 23 23     CBC Latest Ref Rng & Units 01/06/2018 07/29/2017 07/18/2017  WBC 4.0 - 10.5 K/uL 10.4 8.7 9.3  Hemoglobin 12.0 - 15.0 g/dL 12.6 11.0(L) 11.3(L)  Hematocrit 36.0 - 46.0 % 39.8 34.7(L) 34.9(L)  Platelets 150 - 400 K/uL 269 224 208   Lipid Panel     Component Value Date/Time   CHOL 165 02/09/2016 0700   TRIG 94 02/09/2016 0700   HDL 64 02/09/2016 0700   CHOLHDL 2.6 02/09/2016 0700   VLDL 19 02/09/2016 0700   LDLCALC 82 02/09/2016 0700   HEMOGLOBIN A1C No results found for: HGBA1C, MPG TSH No results for input(s): TSH in the last 8760 hours.  Cardiac Studies:   Remote 4.22.20: 1 SVT/8 secs, normal function. Battery longevity is 8 years. RA pacing is 1 %, RV pacing is 99 %, and LV pacing is 99 %.  Echocardiogram 06/27/2019: Left ventricle cavity is normal in size. Mild concentric hypertrophy of the left ventricle. moderate global hypokinesis. Moderately depressed LV systolic function with visual LVEF 40-45%. Indeterminate diastolic function.  Left atrial cavity is mildly dilated. Moderate (Grade II) mitral regurgitation. Small pericardial effusion. No significant change compared to previous study on 08/26/2018.  Nuclear stress test 02/01/2016: No significant ST segment changes or arrhythmias were noted during stress  Negative ETT for ischemia at workload achieved  No significant chest pain symptoms reported  No significant arrhythmia noted  nuclear images report to follow  Right heart catheterization and myocardial biopsy 07/17/2016: Mild pulmonary hypertension, mildly elevated pulmonary capillary wedge pressure, biopsy negative for sarcoid.  Assessment     ICD-10-CM   1. Chronic systolic CHF (congestive heart failure) (HCC)  I50.22   2. Nonischemic cardiomyopathy (HCC)  I42.8   3. Benign essential HTN  I10   4. ICD: [employer] Bi-V ICD (Dynagen X4 CRT-D) MRI compatible ICD in situ  Z95.810   5. Hyperlipidemia associated with type 2 diabetes mellitus ([place])   E11.69    E78.5     EKG 09/15/2019: V paced rhythm, no further analysis.  Scheduled In office ICD check 06/13/2019:   impedance and threshold (850 -> 1274 _>1156 Ohm and 1.3 V ->0.9->2.2 ( March, June and August 2020).  Safety margin for LV pacing increased from 2.5 also 2.8 V in view of this.  Patient has had one brief episode of atrial tachycardia with one-to-one conduction for 6 seconds on 07/13/2018, brief NSVT on 07/08/2018.  Since last transmission.  No other significant arrhythmias.  Otherwise normal functioning ICD.  Longevity greater than5 years.  Scheduled Remote ICD check 09/15/2019:  No VHR episodes. There were 2 PMTs detected. Health trends (patient activity, heart rate variability, average heart rates) are stable. The review of the implantable cardiac monitoring data remains stable. Battery longevity is 7.5 years. RA pacing is 1 %, RV pacing is 99 %, and LV pacing is 99 %.  Recommendations:   Patient is here for 69-month office visit and follow-up for CHF.  She is doing well without any complaints.  States that her health has significantly improved over the last few months she feels.  Blood pressure is stable.  No clinical evidence of decompensated heart failure.  Her weight has also been stable.  She uses 80 mg of Lasix on an as-needed basis for days that she has worsening shortness of breath or leg swelling.  She continues to work to make dietary changes and efforts towards weight loss.  She is considering gastric sleeve procedure and is to meet with surgeon in the near future.  Feel that this is a reasonable option for her and she will notify us if she plans to pursue this.    No changes were made to her medications today.  By her recent remote transmission, ICD is functioning appropriately. No events.  I reviewed her recent labs, lipids are well controlled, continue with simvastatin 20 mg daily.  We will plan to see her back in 3 months, but encouraged her to contact us sooner if  needed.   Toniann Fail, MSN, APRN, FNP-C [facility] Office: [phone] Fax: [phone]

## 2020-01-01 ENCOUNTER — Other Ambulatory Visit: Payer: Self-pay | Admitting: Cardiology

## 2020-01-02 ENCOUNTER — Other Ambulatory Visit: Payer: Self-pay | Admitting: Cardiology

## 2020-01-29 ENCOUNTER — Other Ambulatory Visit: Payer: Self-pay | Admitting: Cardiology

## 2020-03-12 ENCOUNTER — Other Ambulatory Visit: Payer: Self-pay

## 2020-03-12 ENCOUNTER — Ambulatory Visit: Payer: Medicaid Other | Admitting: Cardiology

## 2020-03-12 ENCOUNTER — Encounter: Payer: Self-pay | Admitting: Cardiology

## 2020-03-12 VITALS — BP 130/84 | HR 86 | Temp 97.2°F | Resp 16 | Ht 67.0 in | Wt 301.0 lb

## 2020-03-12 DIAGNOSIS — Z95810 Presence of automatic (implantable) cardiac defibrillator: Secondary | ICD-10-CM

## 2020-03-12 DIAGNOSIS — I428 Other cardiomyopathies: Secondary | ICD-10-CM

## 2020-03-12 DIAGNOSIS — I5022 Chronic systolic (congestive) heart failure: Secondary | ICD-10-CM

## 2020-03-12 DIAGNOSIS — I10 Essential (primary) hypertension: Secondary | ICD-10-CM

## 2020-03-12 NOTE — Progress Notes (Signed)
Primary Physician/Referring:  Henderson Baltimore, FNP  Patient ID: Alicia Cook, female    DOB: [birth year], 54 y.o.   MRN: [id number]  Chief Complaint  Patient presents with  . Congestive Heart Failure  . Follow-up    3 months   HPI:    Alicia Cook  is a 54 y.o. 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 Cardiac MRI performed in 2011 and cardiac PET scan in August 2017 does not appear to be consistent with cardiac sarcoidosis.  She had repeat PET scan performed at [facility] on 03/05/2020 again confirming this.  She also has uncontrolled diabetes, morbid obesity, mild sleep apnea but not tolerating CPAP, systemic hypertension.  She is now enrolled in bariatric program.  Last episode of acute decompensated heart failure was in March 2016 when she underwent CRT-D implant, and 2nd exacerbation was in April 2017.  Since being on aggressive medical therapy and guideline directed medical therapy, she has not had any further deterioration in LV function in fact has improved and no recent hospitalization.  Presents here for a 19-monthoffice visit.  Past Medical History:  Diagnosis Date  . Cataract   . CHF (congestive heart failure) ([place]   . Depression   . Diabetes mellitus without complication ([place]   . Encounter for adjustment of biventricular implantable cardioverter-defibrillator (ICD) 06/13/2019  . Hypertension   . ICD: Cardiac defibrillator in situ 01/13/2015   [employer] Bi-V ICD (Dynagen X4 CRT-D) [facility]  Scheduled Remote ICD check 4.22.20: 1 SVT/8 secs, normal function. Battery longevity is 8 years. RA pacing is 1 %, RV pacing is 99 %, and LV pacing is 99 %.  . [place]ischemic cardiomyopathy [place]    a. EF 35-40% by cath in 2011 with normal cors b. s/p ICD  implantation in 12/2014 c. EF 25% by NST in 02/01/2016  . Sarcoidosis   . Sarcoidosis 02/08/2016   Past Surgical History:  Procedure Laterality Date  . ABDOMINAL SURGERY    . CARDIAC CATHETERIZATION N/A 02/08/2016   Procedure: Left Heart Cath and Coronary Angiography;  Surgeon: JJettie Booze MD;  [facility];  Service: Cardiovascular;  Laterality: N/A;  . CATARACT EXTRACTION Right 12/04/2018  . CESAREAN SECTION    . CHOLECYSTECTOMY    . INSERTION OF ICD     a. s/p dual-chamber [employer] ICD 12/2014  . PACEMAKER INSERTION    . TUBAL LIGATION     Family History  Problem Relation Age of Onset  . Heart attack Father        a. Initial onset in his 543's S/p CABG  . Heart failure Father     Social History   Tobacco Use  . Smoking status: Former Smoker    Packs/day: 1.00    Years: 20.00    Pack years: 20.00    Types: Cigarettes    Quit date: 2016    Years since quitting: 5.3  . Smokeless tobacco: Never Used  Substance Use Topics  . Alcohol use: No    Alcohol/week: 0.0 standard drinks   Marital Status: Single  ROS  Review of Systems  Cardiovascular: Positive for dyspnea on exertion (stable; minimal). Negative for leg swelling and syncope.  Respiratory: Negative for shortness of breath.   Gastrointestinal: Negative for melena.   Objective  Blood pressure 130/84, pulse  86, temperature (!) 97.2 F (36.2 C), temperature source Temporal, resp. rate 16, height '5\' 7"'$  (1.702 m), weight (!) 301 lb (136.5 kg), SpO2 99 %.  Vitals with BMI 03/12/2020 12/16/2019 09/15/2019  Height '5\' 7"'$  '5\' 7"'$  '5\' 7"'$   Weight 301 lbs 293 lbs 294 lbs 10 oz  BMI 47.13 95.62 13.08  Systolic 657 846 962  Diastolic 84 68 67  Pulse 86 76 94     Physical Exam  Constitutional: She appears well-developed. No distress.  Morbidly obese in no acute distress.  Neck: No thyromegaly present.  Cardiovascular: Normal rate, regular rhythm, normal heart sounds and intact distal pulses.  Exam reveals no gallop.  No murmur heard. Pulses:      Carotid pulses are 2+ on the right side and 2+ on the left side.      Dorsalis pedis pulses are 2+ on the right side and 2+ on the left side.       Posterior tibial pulses are 2+ on the right side and 2+ on the left side.  No leg edema, no JVD.   Pulmonary/Chest: Effort normal and breath sounds normal. No accessory muscle usage.  Pacemaker/ICD site noted  in the left infraclavicular fossa.    Abdominal: Soft. Bowel sounds are normal.  Obese. Pannus present   Laboratory examination:   External labs:   Lab 03/04/2020:  BUN 14, creatinine 0.8, serum glucose 140 mg, EGFR >60 mL.  Magnesium 1.6.  Total cholesterol 124, triglycerides 129, HDL 55, LDL 63.  Non-HDL cholesterol 69.  Hb 13.1/HCT 39.3, platelets 207.  Medications and allergies  No Known Allergies   Current Outpatient Medications  Medication Instructions  . aspirin 81 mg, Oral, Daily  . carvedilol (COREG) 25 mg, Oral, 2 times daily with meals  . CORLANOR 7.5 MG TABS tablet TAKE 1 TABLET (7.5 MG TOTAL) BY MOUTH 2 (TWO) TIMES DAILY WITH A MEAL.  Marland Kitchen empagliflozin (JARDIANCE) 25 MG TABS tablet Oral, Daily  . ENTRESTO 97-103 MG TAKE 1 TABLET BY MOUTH TWICE A DAY  . folic acid (FOLVITE) 3 mg, Oral, Daily  . furosemide (LASIX) 40 MG tablet TAKE 2 TABLETS BY MOUTH DAILY AS NEEDED FOR SHORTNESS OF BREATH  . ibuprofen (ADVIL) 800 mg, Oral, Every 8 hours PRN  . insulin detemir (LEVEMIR FLEXTOUCH) 50 Units, Injection, Daily at bedtime  . insulin lispro (HUMALOG) 1-10 Units, Subcutaneous, 3 times daily before meals  . LORazepam (ATIVAN) 0.5 MG tablet Oral, As needed  . metFORMIN (GLUCOPHAGE) 2,000 mg, Oral, Daily with breakfast  . methotrexate 15 mg, Oral, Weekly  . montelukast (SINGULAIR) 10 mg, Oral, Daily at bedtime  . pantoprazole (PROTONIX) 40 MG tablet 1 tablet, Oral, Daily  . potassium chloride (K-DUR) 10 MEQ tablet 10 mEq, Oral, Daily  . sertraline (ZOLOFT) 50 mg, Oral,  Daily  . simvastatin (ZOCOR) 20 mg, Oral, Daily  . sitaGLIPtin (JANUVIA) 100 mg, Oral, Daily  . spironolactone (ALDACTONE) 25 mg, Oral, BH-each morning  . Vitamin D (Ergocalciferol) (DRISDOL) 50,000 Units, Oral, Weekly   Radiology:   No results found.  Cardiac Studies:   Remote 4.22.20: 1 SVT/8 secs, normal function. Battery longevity is 8 years. RA pacing is 1 %, RV pacing is 99 %, and LV pacing is 99 %.  Echocardiogram 06/27/2019: Left ventricle cavity is normal in size. Mild concentric hypertrophy of the left ventricle. moderate global hypokinesis. Moderately depressed LV systolic function with visual LVEF 40-45%. Indeterminate diastolic function.  Left atrial cavity is mildly dilated. Moderate (Grade II)  mitral regurgitation. Small pericardial effusion. No significant change compared to previous study on 08/26/2018.  Nuclear stress test 02/01/2016: No significant ST segment changes or arrhythmias were noted during stress  Negative ETT for ischemia at workload achieved  No significant chest pain symptoms reported  No significant arrhythmia noted  nuclear images report to follow  Right heart catheterization and myocardial biopsy 07/17/2016: Mild pulmonary hypertension, mildly elevated pulmonary capillary wedge pressure, biopsy negative for sarcoid.  Scheduled In office ICD check 06/13/2019:  impedance and threshold (850 -> 1274 _>1156 Ohm and 1.3 V ->0.9->2.2 ( March, June and August 2020).  Safety margin for LV pacing increased from 2.5 also 2.8 V in view of this.  Patient has had one brief episode of atrial tachycardia with one-to-one conduction for 6 seconds on 07/13/2018, brief NSVT on 07/08/2018.  Since last transmission.  No other significant arrhythmias.  Otherwise normal functioning ICD.  Longevity greater than5 years.  Scheduled Remote ICD check 09/15/2019:  No VHR episodes. There were 2 PMTs detected. Health trends (patient activity, heart rate variability, average heart  rates) are stable. The review of the implantable cardiac monitoring data remains stable. Battery longevity is 7.5 years. RA pacing is 1 %, RV pacing is 99 %, and LV pacing is 99 %.  Cardiac PET sarcoidosis with nuclear medicine perfusion 03/05/2020: A myocardial SPECT scan was performed at rest and contains a large, severe perfusion defect involving the anterior, lateral and anterior lateral walls of the left ventricle, necessitating a FDG-PET viability study.  Physiologic distribution of radiotracer. No substantial accumulation of radiotracer in the area of perfusion defect. Other PET findings: Few FDG avid bilateral axillary lymph nodes, index right axillary node measuring 1 cm (SUV max 3.5). Additionally, there are a few hypermetabolic superior mediastinal lymph nodes. Index right upper paratracheal lymph node shows SUV max 2.2. CT: Few small nodules are noted in right upper lobe, similar to 12/02/2019 CT exam. Subsegmental atelectasis/scarring in left lower lobe. Left chest wall cardiac rhythm maintenance device with distal leads terminating in right atrial appendage and right ventricle and coronary sinus. Small fat-containing of umbilical hernia.   EKG  EKG 03/12/2020: Underlying sinus rhythm normal sinus rhythm at the rate of 83 bpm, ventricularly paced rhythm.  No further analysis.  Biventricular pacemaker detected.   09/15/2019: V paced rhythm, no further analysis.   Assessment     ICD-10-CM   1. Chronic systolic CHF (congestive heart failure) (HCC)  I50.22 EKG 12-Lead    PCV ECHOCARDIOGRAM COMPLETE  2. Nonischemic cardiomyopathy (HCC)  I42.8 PCV ECHOCARDIOGRAM COMPLETE  3. Benign essential HTN  I10   4. ICD: [employer] Bi-V ICD (Dynagen X4 CRT-D) MRI compatible ICD in situ  Z95.810      No orders of the defined types were placed in this encounter.   Medications Discontinued During This Encounter  Medication Reason  . simvastatin (ZOCOR) 5 MG tablet Change in therapy     Recommendations:   Averill Winters  is a 54 y.o. 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 Cardiac MRI performed in 2011 and cardiac PET scan in August 2017 does not appear to be consistent with cardiac sarcoidosis.  She had repeat PET scan performed at [facility] on 03/05/2020 again confirming this.  She also has uncontrolled diabetes, morbid obesity, mild sleep apnea but not tolerating CPAP, systemic hypertension.   Patient  is here for 34-monthoffice visit and follow-up for CHF.  She is doing well without any complaints. I reviewed her external labs and also report of the PET scan.   She is now enrolled in bariatric program.  I have discussed with her regarding making lifestyle changes now solitude be beneficial after bariatric surgery.  Blood pressure is well controlled, no changes were made to her medications today.  By her recent remote transmission, ICD is functioning appropriately. No events.  I reviewed her recent labs, lipids are well controlled, continue with simvastatin 20 mg daily.  We will plan to see her back in 6  Months, but encouraged her to contact [email or web address] if needed.  JAdrian Prows MD, FSpring [facility]5/14/2021, 3:53 PM [email or web address]. [place] Pager: [phone] Office: [phone]

## 2020-03-14 ENCOUNTER — Other Ambulatory Visit: Payer: Self-pay | Admitting: Cardiology

## 2020-03-16 ENCOUNTER — Other Ambulatory Visit: Payer: Self-pay | Admitting: Cardiology

## 2020-03-22 ENCOUNTER — Other Ambulatory Visit: Payer: Self-pay | Admitting: Cardiology

## 2020-03-22 DIAGNOSIS — I5022 Chronic systolic (congestive) heart failure: Secondary | ICD-10-CM

## 2020-03-23 ENCOUNTER — Telehealth: Payer: Self-pay

## 2020-03-23 NOTE — Telephone Encounter (Signed)
Received a call from patient in regards to her work limitations. 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 Patient is faxing in form today.

## 2020-03-23 NOTE — Telephone Encounter (Signed)
Will do!

## 2020-03-26 ENCOUNTER — Encounter: Payer: Self-pay | Admitting: Cardiology

## 2020-03-26 NOTE — Telephone Encounter (Signed)
Have not received fax 5/28.

## 2020-03-26 NOTE — Telephone Encounter (Signed)
Called patient in regards to not receiving any paperwork for patient's work restrictions. Patient stated the company sent the incorrect paperwork and was told that you would be able to write a work note for her instead of filling out a form. 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 Please advise. Thanks!

## 2020-05-25 ENCOUNTER — Encounter (HOSPITAL_BASED_OUTPATIENT_CLINIC_OR_DEPARTMENT_OTHER): Payer: Self-pay | Admitting: *Deleted

## 2020-05-25 ENCOUNTER — Emergency Department (HOSPITAL_BASED_OUTPATIENT_CLINIC_OR_DEPARTMENT_OTHER)
Admission: EM | Admit: 2020-05-25 | Discharge: 2020-05-26 | Disposition: A | Discharge status: Home / Self Care | Payer: Medicaid Other | Attending: Emergency Medicine | Admitting: Emergency Medicine

## 2020-05-25 ENCOUNTER — Other Ambulatory Visit: Payer: Self-pay

## 2020-05-25 DIAGNOSIS — Z79899 Other long term (current) drug therapy: Secondary | ICD-10-CM | POA: Insufficient documentation

## 2020-05-25 DIAGNOSIS — I5022 Chronic systolic (congestive) heart failure: Secondary | ICD-10-CM | POA: Diagnosis not present

## 2020-05-25 DIAGNOSIS — Y92002 Bathroom of unspecified non-institutional (private) residence single-family (private) house as the place of occurrence of the external cause: Secondary | ICD-10-CM | POA: Insufficient documentation

## 2020-05-25 DIAGNOSIS — W19XXXA Unspecified fall, initial encounter: Secondary | ICD-10-CM

## 2020-05-25 DIAGNOSIS — S63501A Unspecified sprain of right wrist, initial encounter: Secondary | ICD-10-CM | POA: Insufficient documentation

## 2020-05-25 DIAGNOSIS — Y999 Unspecified external cause status: Secondary | ICD-10-CM | POA: Diagnosis not present

## 2020-05-25 DIAGNOSIS — I110 Hypertensive heart disease with heart failure: Secondary | ICD-10-CM | POA: Diagnosis not present

## 2020-05-25 DIAGNOSIS — W182XXA Fall in (into) shower or empty bathtub, initial encounter: Secondary | ICD-10-CM | POA: Insufficient documentation

## 2020-05-25 DIAGNOSIS — E1159 Type 2 diabetes mellitus with other circulatory complications: Secondary | ICD-10-CM | POA: Insufficient documentation

## 2020-05-25 DIAGNOSIS — Y93E1 Activity, personal bathing and showering: Secondary | ICD-10-CM | POA: Insufficient documentation

## 2020-05-25 DIAGNOSIS — S6991XA Unspecified injury of right wrist, hand and finger(s), initial encounter: Secondary | ICD-10-CM | POA: Diagnosis present

## 2020-05-25 MED ORDER — IBUPROFEN 800 MG PO TABS
800.0000 mg | ORAL_TABLET | Freq: Once | ORAL | Status: AC
  Administered 2020-05-26: 800 mg via ORAL
  Filled 2020-05-25: qty 1

## 2020-05-25 NOTE — ED Triage Notes (Signed)
Fall. She slipped and fell. Right wrist, right shoulder, right knee and left lower pain.

## 2020-05-26 ENCOUNTER — Emergency Department (HOSPITAL_BASED_OUTPATIENT_CLINIC_OR_DEPARTMENT_OTHER): Payer: Medicaid Other

## 2020-05-26 MED ORDER — IBUPROFEN 800 MG PO TABS
800.0000 mg | ORAL_TABLET | Freq: Three times a day (TID) | ORAL | 0 refills | Status: DC | PRN
Start: 2020-05-26 — End: 2021-01-12

## 2020-05-26 NOTE — ED Provider Notes (Signed)
[facility] EMERGENCY DEPARTMENT Provider Note   CSN: [id number] Arrival date & time: 05/25/20  2015     History Chief Complaint  Patient presents with  . Fall    Alicia Cook is a 54 y.o. female.  Patient with history of diabetes, nonischemic cardiomyopathy with AICD in place presenting with slip and fall in the shower.  States she stepped out of the shower and landed on her right wrist and right knee.  Did not hit her head or lose consciousness.  This happened around 6:30 PM.  She has pain to her right wrist and hand as well as her right knee.  Denies any dizzy spells or passing out.  Denies any chest pain or shortness of breath.  Did hit her chin but did not lose consciousness.  Denies any headache, neck pain, back pain, chest pain or abdominal pain or back pain.  No shortness of breath.  Complains of pain to her right wrist and hand as well as her right knee and left shin.  Did not take any medications at home.  The history is provided by the patient.  Fall Pertinent negatives include no chest pain, no abdominal pain, no headaches and no shortness of breath.       Past Medical History:  Diagnosis Date  . Cataract   . CHF (congestive heart failure) (HCC)   . Depression   . Diabetes mellitus without complication (HCC)   . Encounter for adjustment of biventricular implantable cardioverter-defibrillator (ICD) 06/13/2019  . Hypertension   . ICD: Cardiac defibrillator in situ 01/13/2015   [employer] Bi-V ICD (Dynagen X4 CRT-D) 01/13/2015 at [facility] - MRI Compatible  Scheduled Remote ICD check 4.22.20: 1 SVT/8 secs, normal function. Battery longevity is 8 years. RA pacing is 1 %, RV pacing is 99 %, and LV pacing is 99 %.  . Nonischemic cardiomyopathy (HCC)    a. EF 35-40% by cath in 2011 with normal cors b. s/p ICD implantation in 12/2014 c. EF 25% by NST in 02/01/2016  . Sarcoidosis   . Sarcoidosis 02/08/2016    Patient Active Problem List   Diagnosis  Date Noted  . Encounter for adjustment of biventricular implantable cardioverter-defibrillator (ICD) 06/13/2019  . Palpitations 12/05/2018  . OSA (obstructive sleep apnea) 11/04/2018  . Lung disease 01/17/2017  . RLS (restless legs syndrome) 01/17/2017  . Gastroesophageal reflux disease without esophagitis 11/30/2016  . Chest pain 02/08/2016  . Sarcoidosis 02/08/2016  . Benign essential HTN 02/08/2016  . Nonischemic cardiomyopathy (HCC) 02/08/2016  . DM (diabetes mellitus), type 2 (HCC) 02/08/2016  . Chronic systolic CHF (congestive heart failure) (HCC) 02/08/2016  . Acquired hypothyroidism 09/02/2015  . Heart failure with reduced left ventricular function (HCC) 09/02/2015  . Hyperlipidemia 09/02/2015  . PSVT (paroxysmal supraventricular tachycardia) (HCC) 09/02/2015  . ICD: [employer] Bi-V ICD (Dynagen X4 CRT-D) MRI compatible ICD in situ 01/13/2015    Past Surgical History:  Procedure Laterality Date  . ABDOMINAL SURGERY    . CARDIAC CATHETERIZATION N/A 02/08/2016   Procedure: Left Heart Cath and Coronary Angiography;  Surgeon: Corky Crafts, MD;  Location: [facility] INVASIVE CV LAB;  Service: Cardiovascular;  Laterality: N/A;  . CATARACT EXTRACTION Right 12/04/2018  . CESAREAN SECTION    . CHOLECYSTECTOMY    . INSERTION OF ICD     a. s/p dual-chamber [employer] ICD 12/2014  . PACEMAKER INSERTION    . TUBAL LIGATION       OB History   No obstetric  history on file.     Family History  Problem Relation Age of Onset  . Heart attack Father        a. Initial onset in his 58's. S/p CABG  . Heart failure Father     Social History   Tobacco Use  . Smoking status: Former Smoker    Packs/day: 1.00    Years: 20.00    Pack years: 20.00    Types: Cigarettes    Quit date: 2016    Years since quitting: 5.5  . Smokeless tobacco: Never Used  Substance Use Topics  . Alcohol use: No    Alcohol/week: 0.0 standard drinks  . Drug use: No    Home  Medications Prior to Admission medications   Medication Sig Start Date End Date Taking? Authorizing Provider  aspirin 81 MG chewable tablet Chew 81 mg by mouth daily.    [provider]  carvedilol (COREG) 25 MG tablet TAKE 1 TABLET (25 MG TOTAL) BY MOUTH 2 (TWO) TIMES DAILY WITH A MEAL. 03/17/20   Yates Decamp, MD  CORLANOR 7.5 MG TABS tablet TAKE 1 TABLET (7.5 MG TOTAL) BY MOUTH 2 (TWO) TIMES DAILY WITH A MEAL. 03/15/20   Yates Decamp, MD  empagliflozin (JARDIANCE) 25 MG TABS tablet Take by mouth daily. 12/16/18   [provider]  ENTRESTO 97-103 MG TAKE 1 TABLET BY MOUTH TWICE A DAY 03/23/20   Yates Decamp, MD  folic acid (FOLVITE) 1 MG tablet Take 3 mg by mouth daily.     [provider]  furosemide (LASIX) 40 MG tablet TAKE 2 TABLETS BY MOUTH DAILY AS NEEDED FOR SHORTNESS OF BREATH Patient not taking: Reported on 03/12/2020 01/29/20   Toniann Fail, NP  ibuprofen (ADVIL,MOTRIN) 800 MG tablet Take 1 tablet (800 mg total) every 8 (eight) hours as needed by mouth (pain). Patient not taking: Reported on 03/12/2020 09/09/17   Petrucelli, Pleas Koch, PA-C  Insulin Detemir (LEVEMIR FLEXTOUCH) 100 UNIT/ML Pen Inject 50 Units as directed at bedtime.  09/30/18   [provider]  insulin lispro (HUMALOG) 100 UNIT/ML injection Inject 1-10 Units into the skin 3 (three) times daily before meals.    [provider]  LORazepam (ATIVAN) 0.5 MG tablet Take by mouth as needed. 12/22/19   [provider]  metFORMIN (GLUCOPHAGE) 500 MG tablet Take 2,000 mg by mouth daily with breakfast.    [provider]  methotrexate 2.5 MG tablet Take 15 mg by mouth once a week.     [provider]  montelukast (SINGULAIR) 10 MG tablet Take 10 mg by mouth at bedtime.    [provider]  pantoprazole (PROTONIX) 40 MG tablet Take 1 tablet by mouth daily. 01/23/20 04/22/20  [provider]  potassium chloride (K-DUR) 10 MEQ tablet Take 10 mEq by  mouth daily.    [provider]  sertraline (ZOLOFT) 50 MG tablet Take 50 mg by mouth daily.    [provider]  simvastatin (ZOCOR) 20 MG tablet Take 20 mg by mouth daily.    [provider]  sitaGLIPtin (JANUVIA) 100 MG tablet Take 100 mg by mouth daily.    [provider]  spironolactone (ALDACTONE) 25 MG tablet Take 1 tablet (25 mg total) by mouth every morning. 02/17/19   Yates Decamp, MD  Vitamin D, Ergocalciferol, (DRISDOL) 1.25 MG (50000 UT) CAPS capsule Take 50,000 Units by mouth once a week. 09/20/18   [provider]    Allergies    Patient  has no known allergies.  Review of Systems   Review of Systems  Constitutional: Negative for activity change, appetite change and fever.  HENT: Negative for congestion.   Respiratory: Negative for cough, chest tightness and shortness of breath.   Cardiovascular: Negative for chest pain.  Gastrointestinal: Negative for abdominal pain, nausea and vomiting.  Genitourinary: Negative for dysuria and hematuria.  Musculoskeletal: Positive for arthralgias and myalgias.  Neurological: Negative for dizziness, weakness and headaches.   all other systems are negative except as noted in the HPI and PMH.    Physical Exam Updated Vital Signs BP (!) 133/73   Pulse 75   Temp 98.4 F (36.9 C) (Oral)   Resp 20   Ht 5\' 7"  (1.702 m)   Wt (!) 130.6 kg   SpO2 100%   BMI 45.11 kg/m   Physical Exam Vitals and nursing note reviewed.  Constitutional:      General: She is not in acute distress.    Appearance: She is well-developed. She is obese.  HENT:     Head: Normocephalic and atraumatic.     Mouth/Throat:     Pharynx: No oropharyngeal exudate.  Eyes:     Conjunctiva/sclera: Conjunctivae normal.     Pupils: Pupils are equal, round, and reactive to light.  Neck:     Comments: No C-spine tenderness Cardiovascular:     Rate and Rhythm: Normal rate and regular rhythm.     Heart sounds: Normal heart  sounds. No murmur heard.   Pulmonary:     Effort: Pulmonary effort is normal. No respiratory distress.     Breath sounds: Normal breath sounds.  Abdominal:     Palpations: Abdomen is soft.     Tenderness: There is no abdominal tenderness. There is no guarding or rebound.  Musculoskeletal:        General: Swelling and tenderness present. Normal range of motion.     Cervical back: Normal range of motion and neck supple.     Comments: No T or L-spine tenderness  There is pain to palpation of the right ulnar wrist and fifth metacarpal.  No deformity.  No snuffbox tenderness.  Able to flex and extend wrist without difficulty.  Intact radial pulse and cardinal hand movements. Full range of motion of shoulder and elbow.  Right anterior knee tenderness, no ligament laxity.  Flexion and extension are intact. Able to lift leg and keep knee extended.  Hematoma to left shin.  Skin:    General: Skin is warm.  Neurological:     Mental Status: She is alert and oriented to person, place, and time.     Cranial Nerves: No cranial nerve deficit.     Motor: No abnormal muscle tone.     Coordination: Coordination normal.     Comments: No ataxia on finger to nose bilaterally. No pronator drift. 5/5 strength throughout. CN 2-12 intact.Equal grip strength. Sensation intact.   Psychiatric:        Behavior: Behavior normal.     ED Results / Procedures / Treatments   Labs (all labs ordered are listed, but only abnormal results are displayed) Labs Reviewed - No data to display  EKG EKG Interpretation  Date/Time:  Wednesday May 26 2020 00:50:37 EDT Ventricular Rate:  67 PR Interval:  128 QRS Duration: 196 QT Interval:  520 QTC Calculation: 549 R Axis:   -58 Text Interpretation: Atrial-sensed ventricular-paced rhythm with occasional Premature ventricular complexes Abnormal ECG No significant change was found Confirmed by [id number])  on 05/26/2020 12:55:02 AM   Radiology DG Wrist  Complete Right  Result Date: 05/26/2020 CLINICAL DATA:  Initial evaluation for acute trauma, fall. EXAM: RIGHT WRIST - COMPLETE 3+ VIEW COMPARISON:  None. FINDINGS: No acute fracture dislocation. Normal radiocarpal and distal radioulnar articulations maintained. Few small lucent lesions within the scaphoid and lunate are likely degenerative in nature. Osseous mineralization normal. No visible soft tissue injury. IMPRESSION: No acute osseous abnormality about the right wrist. Electronically Signed   By: Rise Mu M.D.   On: 05/26/2020 00:35   DG Tibia/Fibula Left  Result Date: 05/26/2020 CLINICAL DATA:  Initial evaluation for acute trauma, fall. EXAM: LEFT TIBIA AND FIBULA - 2 VIEW COMPARISON:  None. FINDINGS: No acute fracture dislocation. Moderately advanced osteoarthritic changes noted about the knee. Prominent posterior plantar calcaneal enthesophytes noted. Osseous mineralization normal. No visible soft tissue injury. Few scattered vascular phleboliths noted. IMPRESSION: No acute osseous abnormality about the left tibia/fibula. Electronically Signed   By: Rise Mu M.D.   On: 05/26/2020 00:36   DG Knee Complete 4 Views Right  Result Date: 05/26/2020 CLINICAL DATA:  Initial evaluation for acute trauma, fall. EXAM: RIGHT KNEE - COMPLETE 4+ VIEW COMPARISON:  None. FINDINGS: No acute fracture dislocation. No joint effusion. Severe tricompartmental degenerative osteoarthrosis noted about the knee. Osseous mineralization normal. No visible soft tissue injury. IMPRESSION: 1. No acute osseous abnormality about the right knee. 2. Severe tricompartmental degenerative osteoarthrosis. Electronically Signed   By: Rise Mu M.D.   On: 05/26/2020 00:38   DG Hand Complete Right  Result Date: 05/26/2020 CLINICAL DATA:  Initial evaluation for acute trauma, fall. EXAM: RIGHT HAND - COMPLETE 3+ VIEW COMPARISON:  None. FINDINGS: No acute fracture or dislocation. Osseous mineralization  normal. Minor scattered osteoarthritic changes for patient age. No visible soft tissue injury. IMPRESSION: No acute osseous abnormality about the right hand. Electronically Signed   By: Rise Mu M.D.   On: 05/26/2020 00:41    Procedures Procedures (including critical care time)  Medications Ordered in ED Medications  ibuprofen (ADVIL) tablet 800 mg (has no administration in time range)    ED Course  I have reviewed the triage vital signs and the nursing notes.  Pertinent labs & imaging results that were available during my care of the patient were reviewed by me and considered in my medical decision making (see chart for details).    MDM Rules/Calculators/A&P                         Patient with slip and fall presenting with right hand and wrist pain.  Also has right knee pain.  Did not hit head or lose consciousness.  No chest pain or shortness of breath.  Denies any preceding dizzy spell or syncope.  Imaging of hand, wrist and knee are negative for acute fracture or dislocation.  EKG is paced.  We will treat symptomatically with anti-inflammatories.  Wrist splint provided for comfort.  Low suspicion for occult scaphoid fracture.  Attempted to interrogate AICD but did not have appropriate device for [employer] AICD.  Low suspicion for arrhythmia. Patient does not want to wait for technician to come in to interrogate pacemaker.  She states it was a slip and fall.  Splint, ice, elevation, PCP follow-up.  Return precautions discussed. Final Clinical Impression(s) / ED Diagnoses Final diagnoses:  Fall, initial encounter  Sprain of right wrist, initial encounter    Rx / DC Orders ED Discharge Orders  None       Glynn Octave, MD 05/26/20 [phone]

## 2020-05-26 NOTE — Discharge Instructions (Signed)
Your x-rays are negative.  Wear the wrist splint as prescribed and take the anti-inflammatories.  Follow-up with your doctor.  Return to the ED with worsening pain, weakness, numbness, tingling, any other concerns.

## 2020-06-01 ENCOUNTER — Encounter: Payer: Self-pay | Admitting: Cardiology

## 2020-08-05 ENCOUNTER — Other Ambulatory Visit: Payer: Self-pay

## 2020-08-05 DIAGNOSIS — I5022 Chronic systolic (congestive) heart failure: Secondary | ICD-10-CM

## 2020-08-05 MED ORDER — ENTRESTO 97-103 MG PO TABS: 1.0000 | ORAL_TABLET | Freq: Two times a day (BID) | ORAL | 5 refills | Status: DC

## 2020-08-08 ENCOUNTER — Other Ambulatory Visit: Payer: Self-pay | Admitting: Cardiology

## 2020-08-15 IMAGING — DX DG KNEE COMPLETE 4+V*R*
4 series · 4 of 4 positions shown · non-contrast
Comparison: None.

CLINICAL DATA: Initial evaluation for acute trauma, fall.

EXAM:
RIGHT KNEE - COMPLETE 4+ VIEW

[knee ap]
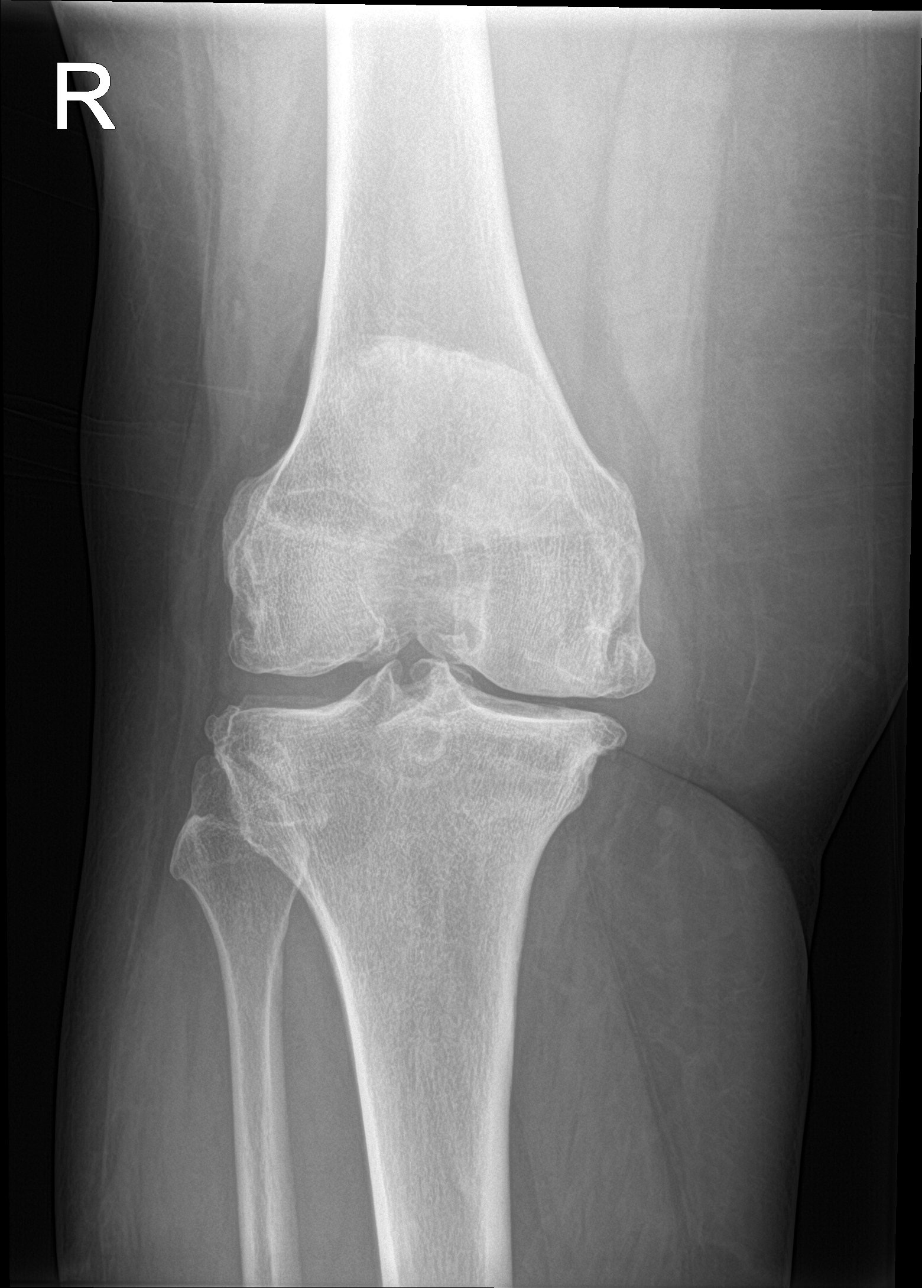

[knee lat]
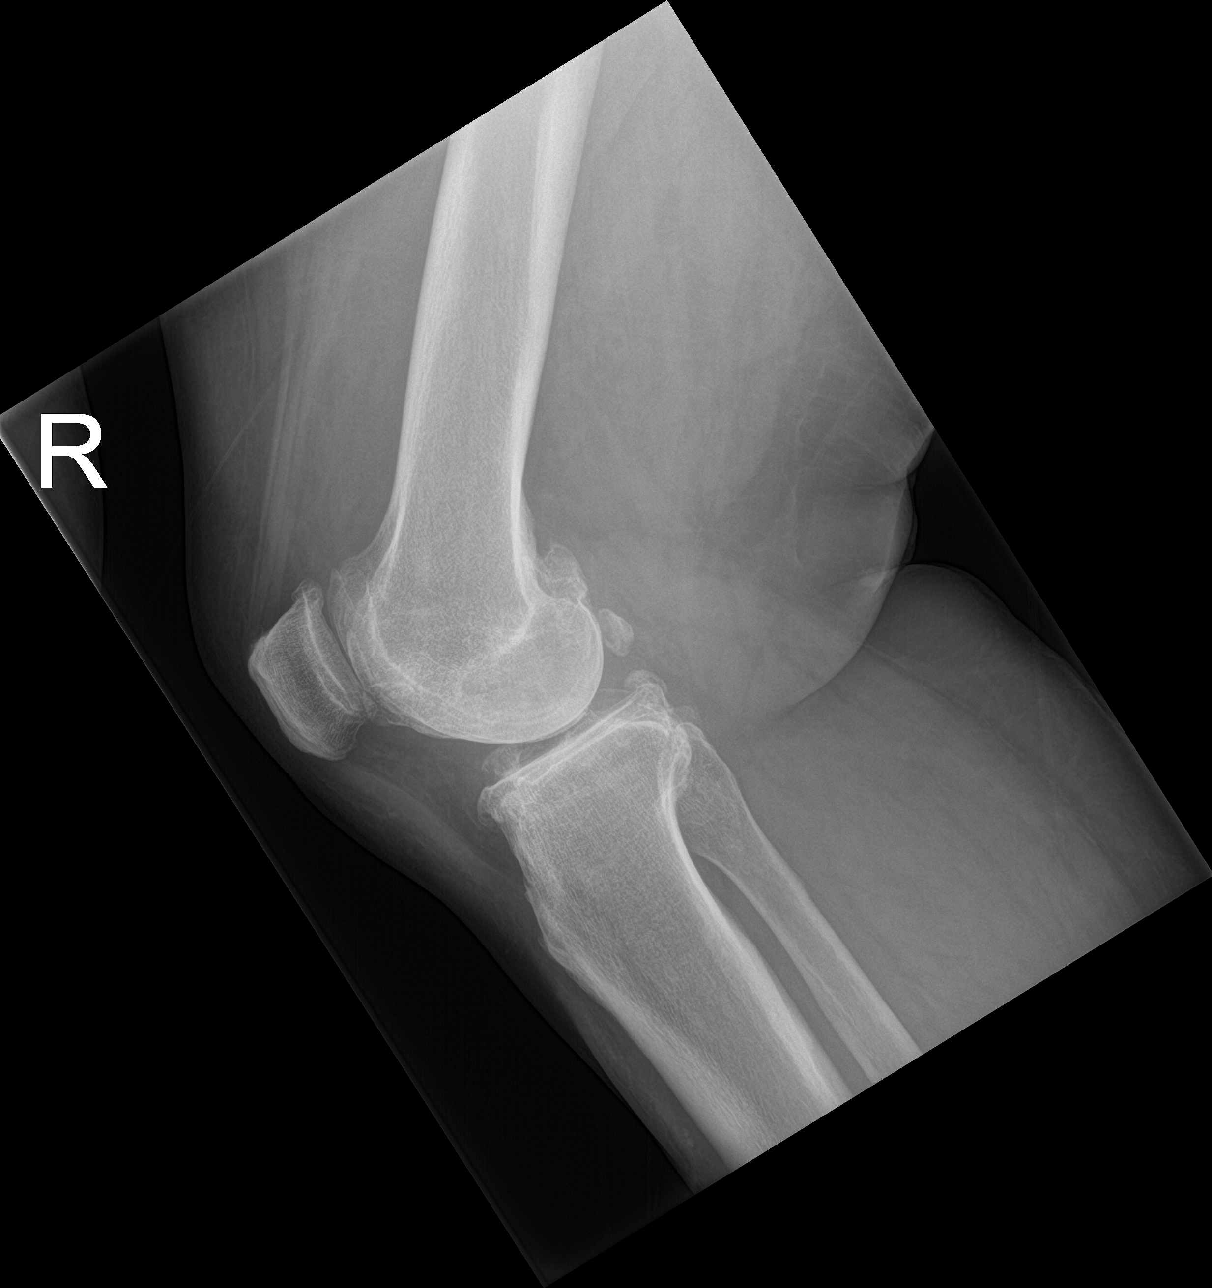

[knee obl (1 of 2)]
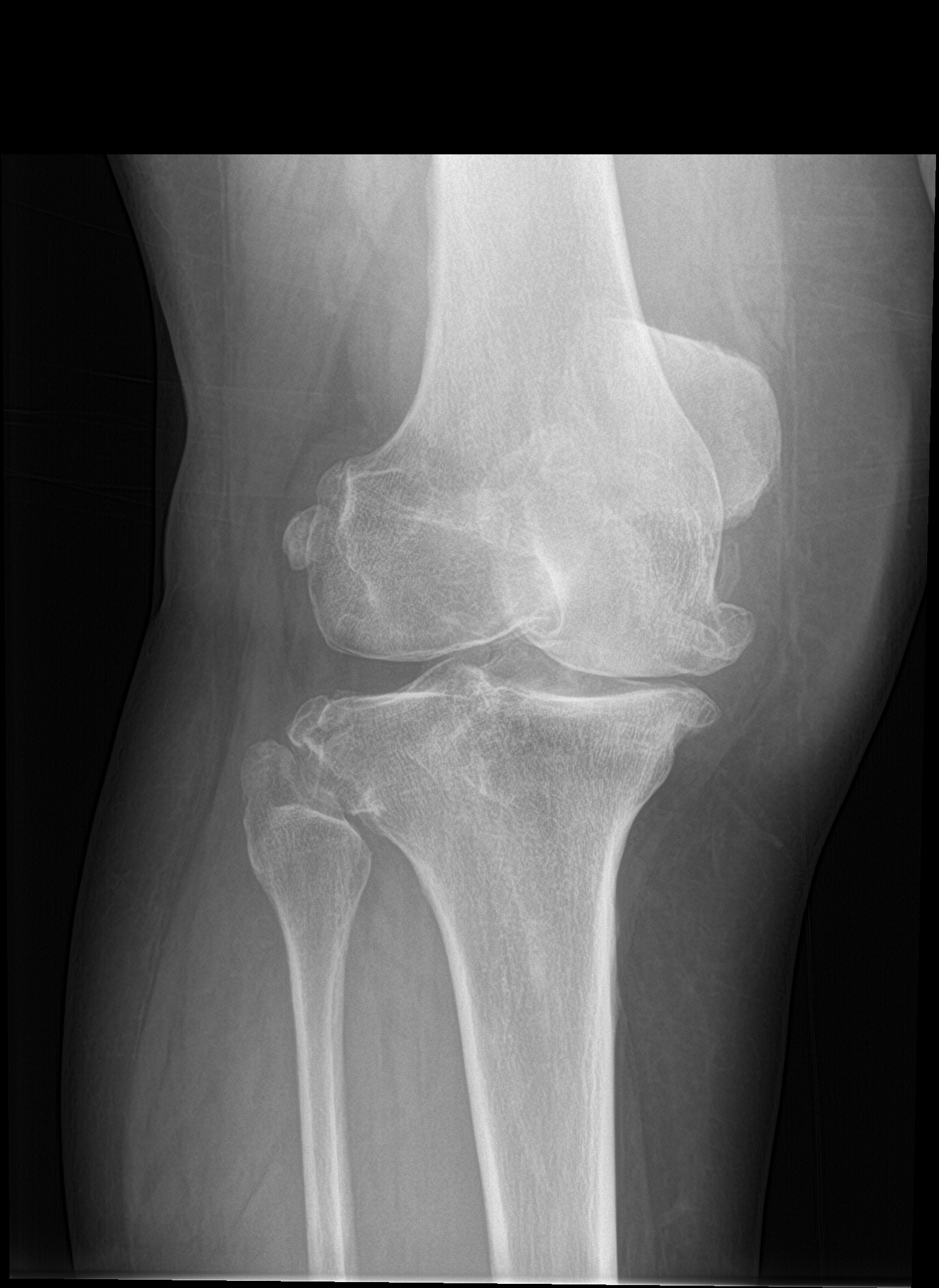

[knee obl (2 of 2)]
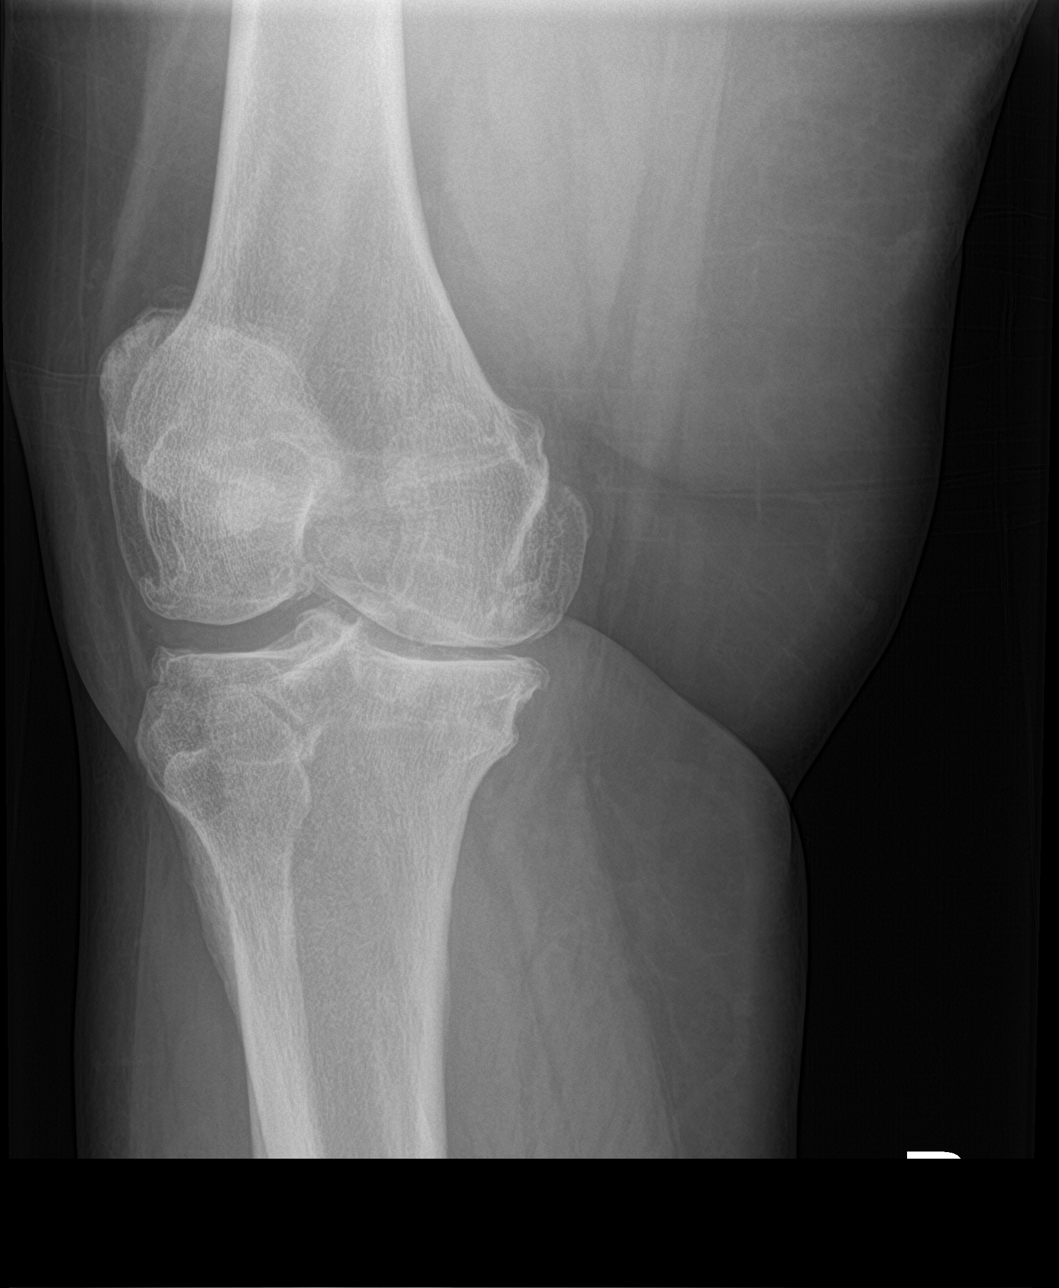

[4 of 4 positions shown; findings below may reference images not displayed]

FINDINGS: No acute fracture dislocation. No joint effusion. Severe
tricompartmental degenerative osteoarthrosis noted about the knee.
Osseous mineralization normal. No visible soft tissue injury.
IMPRESSION: 1. No acute osseous abnormality about the right knee.
2. Severe tricompartmental degenerative osteoarthrosis.

## 2020-09-03 ENCOUNTER — Ambulatory Visit: Payer: Medicaid Other

## 2020-09-03 ENCOUNTER — Other Ambulatory Visit: Payer: Self-pay

## 2020-09-03 DIAGNOSIS — I5022 Chronic systolic (congestive) heart failure: Secondary | ICD-10-CM

## 2020-09-03 DIAGNOSIS — I428 Other cardiomyopathies: Secondary | ICD-10-CM

## 2020-09-06 ENCOUNTER — Telehealth: Payer: Self-pay

## 2020-09-06 NOTE — Telephone Encounter (Signed)
Pt called again (see MyChart message) wants to know based on EF change is she going to be able to continue with surgery, if not she does not want to continue appts for preparation or to have to pay copay at weight loss clinic(?) Please advise

## 2020-09-06 NOTE — Telephone Encounter (Signed)
From pt

## 2020-09-06 NOTE — Telephone Encounter (Signed)
From patient.

## 2020-09-06 NOTE — Telephone Encounter (Signed)
I sent her my chart message: Alicia Cook, Echo is only an echo, you are doing really well and should continue with bariatric evaluation and I do not see this as a contraindication.

## 2020-09-17 ENCOUNTER — Ambulatory Visit: Payer: Medicaid Other | Admitting: Cardiology

## 2020-10-12 ENCOUNTER — Encounter: Payer: Self-pay | Admitting: Cardiology

## 2020-10-12 ENCOUNTER — Ambulatory Visit: Payer: Medicaid Other | Admitting: Cardiology

## 2020-10-12 ENCOUNTER — Other Ambulatory Visit: Payer: Self-pay

## 2020-10-12 VITALS — BP 109/64 | HR 86 | Resp 16 | Ht 67.0 in | Wt 288.0 lb

## 2020-10-12 DIAGNOSIS — I428 Other cardiomyopathies: Secondary | ICD-10-CM

## 2020-10-12 DIAGNOSIS — I5022 Chronic systolic (congestive) heart failure: Secondary | ICD-10-CM

## 2020-10-12 DIAGNOSIS — I10 Essential (primary) hypertension: Secondary | ICD-10-CM

## 2020-10-12 DIAGNOSIS — Z95810 Presence of automatic (implantable) cardiac defibrillator: Secondary | ICD-10-CM

## 2020-10-12 DIAGNOSIS — E1169 Type 2 diabetes mellitus with other specified complication: Secondary | ICD-10-CM

## 2020-10-12 DIAGNOSIS — E785 Hyperlipidemia, unspecified: Secondary | ICD-10-CM

## 2020-10-12 MED ORDER — SPIRONOLACTONE 25 MG PO TABS: 25.0000 mg | ORAL_TABLET | ORAL | 1 refills | Status: DC

## 2020-10-12 NOTE — Progress Notes (Addendum)
Primary Physician/Referring:  Henderson Baltimore, FNP  Patient ID: Alicia Cook, female    DOB: [birth year], 54 y.o.   MRN: [id number]  Chief Complaint  Patient presents with  . Cardiomyopathy  . Hypertension  . Follow-up    6 month   HPI:    Alicia Cook  is a 54 y.o. 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 Cardiac MRI performed in 2011 and cardiac PET scan in August 2017 does not appear to be consistent with cardiac sarcoidosis.  She had repeat PET scan performed at [facility] on 03/05/2020 again confirming this.  She also has uncontrolled diabetes, morbid obesity, mild sleep apnea but not tolerating CPAP, systemic hypertension.  She is now enrolled in bariatric program.  Last episode of acute decompensated heart failure was in March 2016 when she underwent CRT-D implant, and 2nd exacerbation was in April 2017.  She has had no recent hospitalizations.  Patient presents for 20-monthfollow-up.  She remains asymptomatic.  Denies chest pain, dyspnea, fatigue, palpitations, leg edema, orthopnea, PND.  She does state she occasionally feels bloated and dyspnea on exertion, when she feels the symptoms she takes 40 mg of Lasix once and symptoms resolved.  She was requiring furosemide approximately once a week presently.  She continues to follow with endocrinology for uncontrolled diabetes, her last A1c was 7.6%.  Past Medical History:  Diagnosis Date  . Cataract   . CHF (congestive heart failure) ([place]   . Depression   . Diabetes mellitus without complication ([place]   . Encounter for adjustment of biventricular implantable cardioverter-defibrillator (ICD) 06/13/2019  . Hypertension   . ICD: Cardiac defibrillator in situ 01/13/2015   [employer] Bi-V ICD (Dynagen X4 CRT-D[facility]  Scheduled Remote ICD check 4.22.20: 1 SVT/8 secs, normal function. Battery longevity is 8 years. RA pacing is 1 %, RV pacing is 99 %, and LV pacing is 99 %.  . Nonischemic cardiomyopathy ([place]    a. EF 35-40% by cath in 2011 with normal cors b. s/p ICD implantation in 12/2014 c. EF 25% by NST in 02/01/2016  . Sarcoidosis   . Sarcoidosis 02/08/2016   Past Surgical History:  Procedure Laterality Date  . ABDOMINAL SURGERY    . CARDIAC CATHETERIZATION N/A 02/08/2016   Procedure: Left Heart Cath and Coronary Angiography;  Surgeon: JJettie Booze MD;  [facility];  Service: Cardiovascular;  Laterality: N/A;  . CATARACT EXTRACTION Right 12/04/2018  . CESAREAN SECTION    . CHOLECYSTECTOMY    . INSERTION OF ICD     a. s/p dual-chamber [employer] ICD 12/2014  . PACEMAKER INSERTION    . TUBAL LIGATION     Family History  Problem Relation Age of Onset  . Heart attack Father        a. Initial onset in his 576's S/p CABG  . Heart failure Father     Social History   Tobacco Use  . Smoking status: Former Smoker    Packs/day: 1.00    Years: 20.00    Pack years: 20.00    Types: Cigarettes    Quit date: 2016    Years since quitting: 5.9  . Smokeless tobacco: Never Used  Substance Use Topics  . Alcohol use: No    Alcohol/week: 0.0 standard drinks   Marital Status:  Single  ROS  Review of Systems  Constitutional: Negative for malaise/fatigue and weight gain.  Cardiovascular: Positive for dyspnea on exertion (stable; minimal). Negative for chest pain, claudication, leg swelling, near-syncope, orthopnea, palpitations, paroxysmal nocturnal dyspnea and syncope.  Respiratory: Negative for shortness of breath.   Hematologic/Lymphatic: Does not bruise/bleed easily.  Gastrointestinal: Negative for melena.  Neurological: Negative for dizziness and weakness.   Objective  Blood pressure 109/64, pulse 86, resp. rate 16, height $RemoveBe'5\' 7"'USJeaBMMU$  (1.702 m), weight 288  lb (130.6 kg), SpO2 96 %.  Vitals with BMI 10/12/2020 05/26/2020 05/25/2020  Height $Remov'5\' 7"'iLlMMg$  - $'5\' 7"'v$   Weight 288 lbs - 288 lbs  BMI 92.1 - 19.4  Systolic 174 081 448  Diastolic 64 82 73  Pulse 86 65 75     Physical Exam Vitals reviewed.  Constitutional:      General: She is not in acute distress.    Appearance: She is well-developed.     Comments: Morbidly obese in no acute distress.  HENT:     Head: Normocephalic and atraumatic.  Neck:     Thyroid: No thyromegaly.  Cardiovascular:     Rate and Rhythm: Normal rate and regular rhythm.     Pulses: Intact distal pulses.          Carotid pulses are 2+ on the right side and 2+ on the left side.      Dorsalis pedis pulses are 2+ on the right side and 2+ on the left side.       Posterior tibial pulses are 2+ on the right side and 2+ on the left side.     Heart sounds: Normal heart sounds, S1 normal and S2 normal. No murmur heard. No gallop.      Comments: No leg edema, no JVD.  Pulmonary:     Effort: Pulmonary effort is normal. No accessory muscle usage or respiratory distress.     Breath sounds: Normal breath sounds. No wheezing, rhonchi or rales.  Abdominal:     General: Bowel sounds are normal.     Palpations: Abdomen is soft.     Comments: Obese. Pannus present  Musculoskeletal:     Right lower leg: No edema.     Left lower leg: No edema.  Neurological:     Mental Status: She is alert.    Laboratory examination:   External labs:   08/31/2020: A1c 7.6%  07/20/2020: Glucose 101, sodium 139, potassium 4.2, BUN 12, creatinine 0.75, EGFR greater than 90 Hemoglobin 13.0, hematocrit 39.8, platelets 250   Lab 03/04/2020: BUN 14, creatinine 0.8, serum glucose 140 mg, EGFR >60 mL.  Magnesium 1.6. Total cholesterol 124, triglycerides 129, HDL 55, LDL 63.  Non-HDL cholesterol 69. Hb 13.1/HCT 39.3, platelets 207.  Medications and allergies  No Known Allergies   Current Outpatient Medications  Medication Instructions  . aspirin  81 mg, Oral, Daily  . carvedilol (COREG) 25 mg, Oral, 2 times daily with meals  . CORLANOR 7.5 MG TABS tablet TAKE 1 TABLET (7.5 MG TOTAL) BY MOUTH 2 (TWO) TIMES DAILY WITH A MEAL.  Marland Kitchen empagliflozin (JARDIANCE) 25 MG TABS tablet Oral, Daily  . folic acid (FOLVITE) 3 mg, Oral, Daily  . furosemide (LASIX) 40 MG tablet TAKE 2 TABLETS BY MOUTH DAILY AS NEEDED FOR SHORTNESS OF BREATH  . ibuprofen (ADVIL) 800 mg, Oral, Every 8 hours PRN  . insulin detemir (LEVEMIR) 50 Units, Injection, Daily at bedtime  . insulin lispro (HUMALOG) 1-10 Units, Subcutaneous, 3 times daily before meals  .  LORazepam (ATIVAN) 0.5 MG tablet Oral, As needed  . metFORMIN (GLUCOPHAGE) 2,000 mg, Oral, Daily with breakfast  . methotrexate 15 mg, Oral, Weekly  . montelukast (SINGULAIR) 10 mg, Oral, Daily at bedtime  . pantoprazole (PROTONIX) 40 MG tablet 1 tablet, Oral, Daily  . potassium chloride (K-DUR) 10 MEQ tablet 10 mEq, Oral, Daily  . sacubitril-valsartan (ENTRESTO) 97-103 MG 1 tablet, Oral, 2 times daily  . Semaglutide,0.25 or 0.5MG/DOS, (OZEMPIC, 0.25 OR 0.5 MG/DOSE,) 2 MG/1.5ML SOPN No dose, route, or frequency recorded.  . Sertraline HCl 150 mg, Oral, Daily  . simvastatin (ZOCOR) 20 mg, Oral, Daily  . Vitamin D (Ergocalciferol) (DRISDOL) 50,000 Units, Oral, Weekly   Radiology:   No results found.  Cardiac Studies:   Nuclear stress test 02/01/2016: No significant ST segment changes or arrhythmias were noted during stress  Negative ETT for ischemia at workload achieved  No significant chest pain symptoms reported  No significant arrhythmia noted  nuclear images report to follow  Right heart catheterization and myocardial biopsy 07/17/2016: Mild pulmonary hypertension, mildly elevated pulmonary capillary wedge pressure, biopsy negative for sarcoid.  Cardiac PET sarcoidosis with nuclear medicine perfusion 03/05/2020: A myocardial SPECT scan was performed at rest and contains a large, severe perfusion  defect involving the anterior, lateral and anterior lateral walls of the left ventricle, necessitating a FDG-PET viability study.  Physiologic distribution of radiotracer. No substantial accumulation of radiotracer in the area of perfusion defect. Other PET findings: Few FDG avid bilateral axillary lymph nodes, index right axillary node measuring 1 cm (SUV max 3.5). Additionally, there are a few hypermetabolic superior mediastinal lymph nodes. Index right upper paratracheal lymph node shows SUV max 2.2. CT: Few small nodules are noted in right upper lobe, similar to 12/02/2019 CT exam. Subsegmental atelectasis/scarring in left lower lobe. Left chest wall cardiac rhythm maintenance device with distal leads terminating in right atrial appendage and right ventricle and coronary sinus. Small fat-containing of umbilical hernia.   [facility] ECHOCARDIOGRAM COMPLETE 09/03/2020 Left ventricle cavity is normal in size. Moderate concentric hypertrophy of the left ventricle. Moderately depressed LV systolic function with EF 35%. Left ventricle regional wall motion findings: Basal inferolateral and Mid inferolateral akinesis.  Severe global hypokinesis. Calculated EF 35%. No significant valvular abnormality. RV normal in size and function. Pacemaker lead seen. Small pericardial effusion with clear fluids. Compared to 06/26/2017, EF decreased form 40-45%. Inferior akinesis new.   ICD   Scheduled Remote ICD check 07/06/2020:  No AHR episode. No VHR episodes. Health trends (patient activity, heart rate variability, average heart rates) are stable. The review of the implantable cardiac monitoring data remains stable. Battery longevity is 7 years. Battery longevity is 6.5 years. RA pacing is 1 %, RV pacing is 98 %, and LV pacing is 98 %.  Scheduled In office ICD check 06/13/2019:  impedance and threshold (850 -> 1274 _>1156 Ohm and 1.3 V ->0.9->2.2 ( March, June and August 2020).  Safety margin for LV pacing increased from  2.5 also 2.8 V in view of this.  Patient has had one brief episode of atrial tachycardia with one-to-one conduction for 6 seconds on 07/13/2018, brief NSVT on 07/08/2018.  Since last transmission.  No other significant arrhythmias.  Otherwise normal functioning ICD.  Longevity greater than5 years.  Scheduled Remote ICD check 09/15/2019:  No VHR episodes. There were 2 PMTs detected. Health trends (patient activity, heart rate variability, average heart rates) are stable. The review of the implantable cardiac monitoring data remains stable. Battery longevity is  7.5 years. RA pacing is 1 %, RV pacing is 99 %, and LV pacing is 99 %.  Remote 4.22.20: 1 SVT/8 secs, normal function. Battery longevity is 8 years. RA pacing is 1 %, RV pacing is 99 %, and LV pacing is 99 %.   EKG   EKG 10/12/2020: AV paced rhythm at rate of 84 bpm. Biventricular pacing detected. Wide QRS complex. No further analysis.   EKG 03/12/2020: Underlying sinus rhythm normal sinus rhythm at the rate of 83 bpm, ventricularly paced rhythm.  No further analysis.  Biventricular pacemaker detected.    09/15/2019: V paced rhythm, no further analysis.   Assessment     ICD-10-CM   1. Chronic systolic CHF (congestive heart failure) (HCC)  I50.22 EKG 12-Lead  2. Nonischemic cardiomyopathy (HCC)  I42.8   3. Benign essential HTN  I10   4. ICD: [employer] Bi-V ICD (Dynagen X4 CRT-D) MRI compatible ICD in situ  Z95.810   5. Hyperlipidemia associated with type 2 diabetes mellitus ([place])  E11.69    E78.5      No orders of the defined types were placed in this encounter.   Medications Discontinued During This Encounter  Medication Reason  . sitaGLIPtin (JANUVIA) 100 MG tablet Discontinued by provider  . spironolactone (ALDACTONE) 25 MG tablet Change in therapy    Recommendations:   Alicia Cook  is a 54 y.o. 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 Cardiac MRI performed in 2011 and cardiac PET scan in August 2017 does not appear to be consistent with cardiac sarcoidosis.  She had repeat PET scan performed at [facility] on 03/05/2020 again confirming this.  She also has uncontrolled diabetes, morbid obesity, mild sleep apnea but not tolerating CPAP, systemic hypertension.   Patient presents for 24-monthfollow-up of heart failure.  She is presently doing well without complaints and remains stable.  There are no clinical signs of heart failure.  Reviewed and discussed with her recent ICD data as well as echocardiogram results.  Echocardiogram 09/03/2020 revealed mildly decreased LVEF from 40-45% to 35%.  Discussed with patient that despite further reduction of LVEF she remains clinically well compensated and stable.  Gust with her that compared to patients without her cardiovascular comorbidities she is at a higher risk for bariatric surgery, however this risk is not prohibitive from a cardiovascular standpoint.  Also discussed and reviewed patient's recent ICD transmission, is functioning appropriately.  Blood pressure is well controlled.  Lipids are well controlled.  I will not make changes to her current cardiovascular medications.  Encouraged her to continue to follow with bariatric program.   Follow-up in 6 months for myopathy, heart failure.  Patient was seen in collaboration with Dr. GEinar Gip He also reviewed patient's chart and examined the patient. Dr. GEinar Gipis in agreement of the plan.    CAlethia Berthold PA-C 10/12/2020, 5:30 PM Office: [phone]

## 2020-10-18 ENCOUNTER — Other Ambulatory Visit: Payer: Self-pay | Admitting: Cardiology

## 2020-12-07 ENCOUNTER — Telehealth: Payer: Self-pay

## 2020-12-07 NOTE — Telephone Encounter (Signed)
All good. NO change needed. I wish her the best

## 2020-12-07 NOTE — Telephone Encounter (Signed)
Pt called wanting you to know that she has started on her pre-op diet and her surgery is scheduled for 12/20/20. She wants to know if any adjustments to her meds needs to be done or if there if anything else she is supposed to be doing. States you already cleared her unless she needs to be seen again prior to surgery.//ah

## 2020-12-08 NOTE — Telephone Encounter (Signed)
Pt aware.//ah

## 2020-12-20 HISTORY — PX: ABDOMINAL SURGERY: SHX537

## 2020-12-23 ENCOUNTER — Telehealth: Payer: Self-pay

## 2020-12-23 NOTE — Telephone Encounter (Signed)
Set up OV in 2 weeks. Congrats !!. Glad

## 2020-12-23 NOTE — Telephone Encounter (Signed)
Pt called to advise that she had her surgery on Monday and everything went well. She was advised to f/u w/ you to adjust meds. They only have her on Lipitor, Jardiance, Metformin and Coreg per pt. Please call her to discuss and/or advise//ah

## 2020-12-24 NOTE — Telephone Encounter (Signed)
Scheduled w/ you 01/12/21. Advised to continue same meds currently taking until ov.

## 2021-01-12 ENCOUNTER — Ambulatory Visit: Payer: Medicaid Other | Admitting: Cardiology

## 2021-01-12 ENCOUNTER — Encounter: Payer: Self-pay | Admitting: Cardiology

## 2021-01-12 ENCOUNTER — Other Ambulatory Visit: Payer: Self-pay

## 2021-01-12 VITALS — BP 119/78 | HR 105 | Temp 98.0°F | Ht 67.0 in | Wt 264.0 lb

## 2021-01-12 DIAGNOSIS — I5022 Chronic systolic (congestive) heart failure: Secondary | ICD-10-CM

## 2021-01-12 DIAGNOSIS — I428 Other cardiomyopathies: Secondary | ICD-10-CM

## 2021-01-12 DIAGNOSIS — Z95810 Presence of automatic (implantable) cardiac defibrillator: Secondary | ICD-10-CM

## 2021-01-12 MED ORDER — ENTRESTO 24-26 MG PO TABS: 1.0000 | ORAL_TABLET | Freq: Two times a day (BID) | ORAL | 0 refills | Status: DC

## 2021-01-12 MED ORDER — ENTRESTO 49-51 MG PO TABS: 1.0000 | ORAL_TABLET | Freq: Two times a day (BID) | ORAL | 0 refills | Status: DC

## 2021-01-12 MED ORDER — IVABRADINE HCL 7.5 MG PO TABS: 7.5000 mg | ORAL_TABLET | Freq: Two times a day (BID) | ORAL | 3 refills | Status: DC

## 2021-01-12 NOTE — Progress Notes (Signed)
Primary Physician/Referring:  Henderson Baltimore, FNP  Patient ID: Alicia Cook, female    DOB: [birth year], 55 y.o.   MRN: [id number]  Chief Complaint  Patient presents with  . Chronic systolic CHF   . Follow-up   HPI:    Alicia Cook  is a 55 y.o. 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 Cardiac MRI performed in 2011 and cardiac PET scan in August 2017 does not appear to be consistent with cardiac sarcoidosis.  She had repeat PET scan performed at [facility] on 03/05/2020 again confirming this.  She also has uncontrolled diabetes, morbid obesity, mild sleep apnea but not tolerating CPAP, systemic hypertension.  Patient underwent laparoscopic gastric sleeve on 12/20/2020 and so far has lost about 25 pounds in weight.  All her cardiac medications were discontinued as her blood pressure has been low.  Initial weight was 291 pounds during presurgical evaluation.  She is feeling the best she has in quite a while, she has not used any furosemide in the last 4 to 5 months, and denies any dizziness, syncope, leg edema, PND or orthopnea.  She now presents for medication management as all her cardiac medications have been discontinued.  Since bariatric surgery, all her diabetic medications including statins have also been discontinued.  Past Medical History:  Diagnosis Date  . Cataract   . CHF (congestive heart failure) ([place])   . Depression   . Diabetes mellitus without complication ([place])   . Encounter for adjustment of biventricular implantable cardioverter-defibrillator (ICD) 06/13/2019  . Hypertension   . ICD: Cardiac defibrillator in situ 01/13/2015   [employer] Bi-V ICD (Dynagen X4 CRT-D) 01/13/2015 at [facility] - MRI Compatible  Scheduled Remote ICD check 4.22.20: 1 SVT/8 secs, normal function.  Battery longevity is 8 years. RA pacing is 1 %, RV pacing is 99 %, and LV pacing is 99 %.  . Nonischemic cardiomyopathy ([place])    a. EF 35-40% by cath in 2011 with normal cors b. s/p ICD implantation in 12/2014 c. EF 25% by NST in 02/01/2016  . Sarcoidosis   . Sarcoidosis 02/08/2016   Past Surgical History:  Procedure Laterality Date  . ABDOMINAL SURGERY  12/20/2020    laparoscopic sleeve gastrectomy  . CARDIAC CATHETERIZATION N/A 02/08/2016   Procedure: Left Heart Cath and Coronary Angiography;  Surgeon: Jettie Booze, MD;  Location: [place] [facility];  Service: Cardiovascular;  Laterality: N/A;  . CATARACT EXTRACTION Right 12/04/2018  . CESAREAN SECTION    . CHOLECYSTECTOMY    . INSERTION OF ICD     a. s/p dual-chamber [employer] ICD 12/2014  . PACEMAKER INSERTION    . TUBAL LIGATION     Family History  Problem Relation Age of Onset  . Heart attack Father        a. Initial onset in his 32's. S/p CABG  . Heart failure Father     Social History   Tobacco Use  . Smoking status: Former Smoker    Packs/day: 1.00    Years: 20.00    Pack years: 20.00    Types: Cigarettes    Quit date: 2016    Years since quitting: 6.2  . Smokeless tobacco: Never Used  Substance Use Topics  . Alcohol use: No    Alcohol/week: 0.0 standard drinks   Marital Status: Single  ROS  Review of  Systems  Constitutional: Negative for malaise/fatigue and weight gain.  Cardiovascular: Negative for chest pain, claudication, dyspnea on exertion, leg swelling, near-syncope, orthopnea, palpitations, paroxysmal nocturnal dyspnea and syncope.  Respiratory: Negative for shortness of breath.   Hematologic/Lymphatic: Does not bruise/bleed easily.  Gastrointestinal: Negative for melena.  Neurological: Negative for dizziness and weakness.   Objective  Blood pressure 119/78, pulse (!) 105, temperature 98 F (36.7 C), height 5' 7"  (1.702 m), weight 264 lb (119.7 kg), SpO2 98 %.  Vitals with BMI  01/12/2021 10/12/2020 05/26/2020  Height 5' 7"  5' 7"  -  Weight 264 lbs 288 lbs -  BMI 78.29 56.2 -  Systolic 130 865 784  Diastolic 78 64 82  Pulse 696 86 65     Physical Exam Vitals reviewed.  Constitutional:      General: She is not in acute distress.    Appearance: She is well-developed.     Comments: Morbidly obese in no acute distress.  HENT:     Head: Normocephalic and atraumatic.  Neck:     Thyroid: No thyromegaly.  Cardiovascular:     Rate and Rhythm: Regular rhythm. Tachycardia present.     Pulses: Intact distal pulses.          Carotid pulses are 2+ on the right side and 2+ on the left side.      Dorsalis pedis pulses are 2+ on the right side and 2+ on the left side.       Posterior tibial pulses are 2+ on the right side and 2+ on the left side.     Heart sounds: Normal heart sounds, S1 normal and S2 normal. No murmur heard. No gallop.      Comments: No leg edema, no JVD.  Pulmonary:     Effort: Pulmonary effort is normal. No accessory muscle usage or respiratory distress.     Breath sounds: Normal breath sounds. No wheezing, rhonchi or rales.  Abdominal:     General: Bowel sounds are normal.     Palpations: Abdomen is soft.     Comments: Obese. Pannus present  Musculoskeletal:     Right lower leg: No edema.     Left lower leg: No edema.  Neurological:     Mental Status: She is alert.    Laboratory examination:   External labs:   Labs 12/22/2020:  Hb 12.1/HCT 36.8, platelets 227.  Sodium 136, potassium 3.8, BUN 7, creatinine 0.69, EGFR >90 mL.  Serum glucose 106 mg.  08/31/2020: A1c 7.6%  07/20/2020: Glucose 101, sodium 139, potassium 4.2, BUN 12, creatinine 0.75, EGFR greater than 90 Hemoglobin 13.0, hematocrit 39.8, platelets 250   Lab 03/04/2020: BUN 14, creatinine 0.8, serum glucose 140 mg, EGFR >60 mL.  Magnesium 1.6. Total cholesterol 124, triglycerides 129, HDL 55, LDL 63.  Non-HDL cholesterol 69. Hb 13.1/HCT 39.3, platelets  207.  Medications and allergies  No Known Allergies  Current Outpatient Medications on File Prior to Visit  Medication Sig Dispense Refill  . carvedilol (COREG) 25 MG tablet TAKE 1 TABLET (25 MG TOTAL) BY MOUTH 2 (TWO) TIMES DAILY WITH A MEAL. 180 tablet 2  . dicyclomine (BENTYL) 10 MG capsule Take 10 mg by mouth 3 (three) times daily as needed.    . folic acid (FOLVITE) 1 MG tablet Take 3 mg by mouth daily.     . furosemide (LASIX) 40 MG tablet TAKE 2 TABLETS BY MOUTH DAILY AS NEEDED FOR SHORTNESS OF BREATH (Patient taking differently: Take by mouth daily as needed.  1 tablet Shortness of breath) 60 tablet 3  . hydrOXYzine (VISTARIL) 25 MG capsule Take 25 mg by mouth as needed.    Marland Kitchen LORazepam (ATIVAN) 0.5 MG tablet Take by mouth as needed.    . pantoprazole (PROTONIX) 40 MG tablet Take 1 tablet by mouth daily.    . potassium chloride (K-DUR) 10 MEQ tablet Take 10 mEq by mouth daily.    . Semaglutide,0.25 or 0.5MG /DOS, (OZEMPIC, 0.25 OR 0.5 MG/DOSE,) 2 MG/1.5ML SOPN     . Sertraline HCl 150 MG CAPS Take 150 mg by mouth daily.    . Vitamin D, Ergocalciferol, (DRISDOL) 1.25 MG (50000 UT) CAPS capsule Take 50,000 Units by mouth once a week.     No current facility-administered medications on file prior to visit.     Radiology:   No results found.  Cardiac Studies:   Nuclear stress test 02/01/2016: No significant ST segment changes or arrhythmias were noted during stress  Negative ETT for ischemia at workload achieved  No significant chest pain symptoms reported  No significant arrhythmia noted  nuclear images report to follow  Right heart catheterization and myocardial biopsy 07/17/2016: Mild pulmonary hypertension, mildly elevated pulmonary capillary wedge pressure, biopsy negative for sarcoid.  Cardiac PET sarcoidosis with nuclear medicine perfusion 03/05/2020: A myocardial SPECT scan was performed at rest and contains a large, severe perfusion defect involving the anterior,  lateral and anterior lateral walls of the left ventricle, necessitating a FDG-PET viability study.  Physiologic distribution of radiotracer. No substantial accumulation of radiotracer in the area of perfusion defect. Other PET findings: Few FDG avid bilateral axillary lymph nodes, index right axillary node measuring 1 cm (SUV max 3.5). Additionally, there are a few hypermetabolic superior mediastinal lymph nodes. Index right upper paratracheal lymph node shows SUV max 2.2. CT: Few small nodules are noted in right upper lobe, similar to 12/02/2019 CT exam. Subsegmental atelectasis/scarring in left lower lobe. Left chest wall cardiac rhythm maintenance device with distal leads terminating in right atrial appendage and right ventricle and coronary sinus. Small fat-containing of umbilical hernia.   PCV ECHOCARDIOGRAM COMPLETE 09/03/2020 Left ventricle cavity is normal in size. Moderate concentric hypertrophy of the left ventricle. Moderately depressed LV systolic function with EF 35%. Left ventricle regional wall motion findings: Basal inferolateral and Mid inferolateral akinesis.  Severe global hypokinesis. Calculated EF 35%. No significant valvular abnormality. RV normal in size and function. Pacemaker lead seen. Small pericardial effusion with clear fluids. Compared to 06/26/2017, EF decreased form 40-45%. Inferior akinesis new.   ICD   Scheduled Remote ICD check 10/05/2020:  There were 2 atrial high rate episodes detected. The longest lasted 47 seconds in duration. No EGMs available for review. There was a <1 % cumulative atrial arrhythmia burden. No VHR episodes.  Health trends (patient activity, heart rate variability, average heart rates) are stable. The review of the implantable cardiac monitoring data remains stable. Battery longevity is 6 years . RA pacing is 1 %, RV pacing is 98 %, and LV pacing is 98 %.   EKG   EKG 10/12/2020: AV paced rhythm at rate of 84 bpm. Biventricular pacing  detected. Wide QRS complex. No further analysis.   EKG 03/12/2020: Underlying sinus rhythm normal sinus rhythm at the rate of 83 bpm, ventricularly paced rhythm.  No further analysis.  Biventricular pacemaker detected.    09/15/2019: V paced rhythm, no further analysis.   Assessment     ICD-10-CM   1. Nonischemic cardiomyopathy (HCC)  I42.8 ivabradine (CORLANOR) 7.5  MG TABS tablet    sacubitril-valsartan (ENTRESTO) 24-26 MG    sacubitril-valsartan (ENTRESTO) 49-51 MG  2. Chronic systolic CHF (congestive heart failure) (HCC)  I50.22 ivabradine (CORLANOR) 7.5 MG TABS tablet    sacubitril-valsartan (ENTRESTO) 24-26 MG    sacubitril-valsartan (ENTRESTO) 49-51 MG  3. ICD: [employer] Bi-V ICD (Dynagen X4 CRT-D) MRI compatible ICD in situ  Z95.810      Meds ordered this encounter  Medications  . ivabradine (CORLANOR) 7.5 MG TABS tablet    Sig: Take 1 tablet (7.5 mg total) by mouth 2 (two) times daily with a meal.    Dispense:  60 tablet    Refill:  3    PLEASE REFILL  . sacubitril-valsartan (ENTRESTO) 24-26 MG    Sig: Take 1 tablet by mouth 2 (two) times daily.    Dispense:  28 tablet    Refill:  0  . sacubitril-valsartan (ENTRESTO) 49-51 MG    Sig: Take 1 tablet by mouth 2 (two) times daily. Start after you have finished 24/26 mg dosage    Dispense:  28 tablet    Refill:  0    Medications Discontinued During This Encounter  Medication Reason  . ibuprofen (ADVIL) 800 MG tablet Completed Course  . insulin detemir (LEVEMIR) 100 UNIT/ML FlexPen Error  . insulin lispro (HUMALOG) 100 UNIT/ML injection Error  . montelukast (SINGULAIR) 10 MG tablet Completed Course  . metFORMIN (GLUCOPHAGE) 500 MG tablet Completed Course  . methotrexate 2.5 MG tablet Completed Course  . simvastatin (ZOCOR) 20 MG tablet Completed Course  . sacubitril-valsartan (ENTRESTO) 97-103 MG Dose change  . CORLANOR 7.5 MG TABS tablet Reorder  . aspirin 81 MG chewable tablet Discontinued by provider  .  empagliflozin (JARDIANCE) 25 MG TABS tablet Discontinued by provider  . spironolactone (ALDACTONE) 25 MG tablet Discontinued by provider    Recommendations:   Isela Stantz  is a 55 y.o. 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 Cardiac MRI performed in 2011 and cardiac PET scan in August 2017 does not appear to be consistent with cardiac sarcoidosis.  She had repeat PET scan performed at [facility] on 03/05/2020 again confirming this.  She also has uncontrolled diabetes, morbid obesity, mild sleep apnea but not tolerating CPAP, systemic hypertension.   Patient underwent laparoscopic gastric sleeve on 12/20/2020 and so far has lost about 25 pounds in weight.  All her cardiac medications were discontinued as her blood pressure has been low.  Initial weight was 291 pounds during presurgical evaluation.  Patient today is not in any clinical congestive heart failure.  Lungs are clear, no leg edema, also no symptoms to suggest heart failure.  I would like to reinitiate back on Entresto, 24/26 mg twice daily, samples were given and also samples for 49/51 mg given to the patient for 2 weeks each.  Although her blood pressure is borderline, she is completely asymptomatic and her renal function and labs are within normal limits.  With regard to change from carvedilol to metoprolol succinate, her symptoms of heart failure had significantly improved since being on carvedilol.  As her heart rate is elevated, this is related to patient stopping Corlanor.  I have reinitiated 7.5 mg twice daily, this should also not impact her blood pressure as well.  Patient has continued to take carvedilol and would resume the same for now.  As long as  she is asymptomatic, renal function remained stable, I would not worry too much about the blood pressure  itself and would continue cardiac medications.  I will see her back in 2 to 3 weeks for follow-up and close monitoring.  If she is tolerating moderate dose of Entresto at that time, I will uptitrated to max dose.  Spironolactone was also discontinued, I agreed to continue to hold it and she will continue to use furosemide only on a as needed basis.  Once stable, would like to restart spironolactone.  She has an appointment to see her PCP in 2 weeks and labs are being monitored closely.  A BMP in 2 weeks to 3 weeks is appropriate, if not recently done, will order it on her next office visit.  Our plans will be to go to the max dose Entresto and reinitiate spironolactone on her next office visit if she remains stable.  40-minute encounter.     Adrian Prows, PA-C 01/12/2021, 3:22 PM Office: [phone]

## 2021-02-02 ENCOUNTER — Ambulatory Visit: Payer: Medicaid Other | Admitting: Student

## 2021-02-08 ENCOUNTER — Emergency Department (HOSPITAL_BASED_OUTPATIENT_CLINIC_OR_DEPARTMENT_OTHER): Payer: Medicaid Other

## 2021-02-08 ENCOUNTER — Other Ambulatory Visit: Payer: Self-pay

## 2021-02-08 ENCOUNTER — Encounter (HOSPITAL_BASED_OUTPATIENT_CLINIC_OR_DEPARTMENT_OTHER): Payer: Self-pay

## 2021-02-08 ENCOUNTER — Emergency Department (HOSPITAL_BASED_OUTPATIENT_CLINIC_OR_DEPARTMENT_OTHER)
Admission: EM | Admit: 2021-02-08 | Discharge: 2021-02-08 | Disposition: A | Discharge status: Home / Self Care | Payer: Medicaid Other | Attending: Emergency Medicine | Admitting: Emergency Medicine

## 2021-02-08 DIAGNOSIS — R1031 Right lower quadrant pain: Secondary | ICD-10-CM | POA: Diagnosis not present

## 2021-02-08 DIAGNOSIS — Z87891 Personal history of nicotine dependence: Secondary | ICD-10-CM | POA: Insufficient documentation

## 2021-02-08 DIAGNOSIS — I5022 Chronic systolic (congestive) heart failure: Secondary | ICD-10-CM | POA: Diagnosis not present

## 2021-02-08 DIAGNOSIS — Z79899 Other long term (current) drug therapy: Secondary | ICD-10-CM | POA: Diagnosis not present

## 2021-02-08 DIAGNOSIS — E119 Type 2 diabetes mellitus without complications: Secondary | ICD-10-CM | POA: Insufficient documentation

## 2021-02-08 DIAGNOSIS — I110 Hypertensive heart disease with heart failure: Secondary | ICD-10-CM | POA: Diagnosis not present

## 2021-02-08 LAB — CBC
HCT: 41.8 % (ref 36.0–46.0)
Hemoglobin: 13.1 g/dL (ref 12.0–15.0)
MCH: 28 pg (ref 26.0–34.0)
MCHC: 31.3 g/dL (ref 30.0–36.0)
MCV: 89.3 fL (ref 80.0–100.0)
Platelets: 202 10*3/uL (ref 150–400)
RBC: 4.68 MIL/uL (ref 3.87–5.11)
RDW: 14 % (ref 11.5–15.5)
WBC: 9.2 10*3/uL (ref 4.0–10.5)
nRBC: 0 % (ref 0.0–0.2)

## 2021-02-08 LAB — URINALYSIS, ROUTINE W REFLEX MICROSCOPIC
Bilirubin Urine: NEGATIVE
Glucose, UA: >=500 mg/dL — AB
Hgb urine dipstick: NEGATIVE
Ketones, ur: NEGATIVE mg/dL
Leukocytes,Ua: NEGATIVE
Nitrite: NEGATIVE
Protein, ur: NEGATIVE mg/dL
Specific Gravity, Urine: 1.02 (ref 1.005–1.030)
pH: 5.5 (ref 5.0–8.0)

## 2021-02-08 LAB — COMPREHENSIVE METABOLIC PANEL
ALT: 56 U/L — ABNORMAL HIGH (ref 0–44)
AST: 37 U/L (ref 15–41)
Albumin: 3.9 g/dL (ref 3.5–5.0)
Alkaline Phosphatase: 73 U/L (ref 38–126)
Anion gap: 7 (ref 5–15)
BUN: 22 mg/dL — ABNORMAL HIGH (ref 6–20)
CO2: 25 mmol/L (ref 22–32)
Calcium: 9.2 mg/dL (ref 8.9–10.3)
Chloride: 106 mmol/L (ref 98–111)
Creatinine, Ser: 0.75 mg/dL (ref 0.44–1.00)
GFR, Estimated: >60 mL/min (ref >60)
Glucose, Bld: 90 mg/dL (ref 70–99)
Potassium: 3.9 mmol/L (ref 3.5–5.1)
Sodium: 138 mmol/L (ref 135–145)
Total Bilirubin: 0.2 mg/dL — ABNORMAL LOW (ref 0.3–1.2)
Total Protein: 7.3 g/dL (ref 6.5–8.1)

## 2021-02-08 LAB — URINALYSIS, MICROSCOPIC (REFLEX): RBC / HPF: NONE SEEN RBC/hpf (ref 0–5)

## 2021-02-08 LAB — LIPASE, BLOOD: Lipase: 63 U/L — ABNORMAL HIGH (ref 11–51)

## 2021-02-08 LAB — PREGNANCY, URINE: Preg Test, Ur: NEGATIVE

## 2021-02-08 MED ORDER — FENTANYL CITRATE (PF) 100 MCG/2ML IJ SOLN
50.0000 ug | Freq: Once | INTRAMUSCULAR | Status: AC
  Administered 2021-02-08: 50 ug via INTRAVENOUS
  Filled 2021-02-08: qty 2

## 2021-02-08 MED ORDER — IOHEXOL 300 MG/ML  SOLN
100.0000 mL | Freq: Once | INTRAMUSCULAR | Status: AC | PRN
  Administered 2021-02-08: 100 mL via INTRAVENOUS

## 2021-02-08 MED ORDER — SODIUM CHLORIDE 0.9 % IV BOLUS
1000.0000 mL | Freq: Once | INTRAVENOUS | Status: AC
  Administered 2021-02-08: 1000 mL via INTRAVENOUS

## 2021-02-08 NOTE — ED Triage Notes (Signed)
Pt c/o RLQ/groin pain started last night-denies n/v/d-NAD-steady gait

## 2021-02-08 NOTE — ED Provider Notes (Signed)
[facility] EMERGENCY DEPARTMENT Provider Note   CSN: [id number] Arrival date & time: 02/08/21  1924     History Chief Complaint  Patient presents with  . Abdominal Pain    Alicia Cook is a 55 y.o. female.  The history is provided by the patient.  Abdominal Pain Pain location:  RLQ Pain quality: aching   Pain radiates to:  Does not radiate Pain severity:  Mild Onset quality:  Gradual Timing:  Intermittent Progression:  Waxing and waning Chronicity:  New Context: previous surgery   Relieved by:  Nothing Worsened by:  Nothing Associated symptoms: no chest pain, no chills, no cough, no dysuria, no fever, no hematuria, no shortness of breath, no sore throat and no vomiting   Risk factors: multiple surgeries        Past Medical History:  Diagnosis Date  . Cataract   . CHF (congestive heart failure) (HCC)   . Depression   . Diabetes mellitus without complication (HCC)   . Encounter for adjustment of biventricular implantable cardioverter-defibrillator (ICD) 06/13/2019  . Hypertension   . ICD: Cardiac defibrillator in situ 01/13/2015   [employer] Bi-V ICD (Dynagen X4 CRT-D) 01/13/2015 at [facility] - MRI Compatible  Scheduled Remote ICD check 4.22.20: 1 SVT/8 secs, normal function. Battery longevity is 8 years. RA pacing is 1 %, RV pacing is 99 %, and LV pacing is 99 %.  . Nonischemic cardiomyopathy (HCC)    a. EF 35-40% by cath in 2011 with normal cors b. s/p ICD implantation in 12/2014 c. EF 25% by NST in 02/01/2016  . Sarcoidosis   . Sarcoidosis 02/08/2016    Patient Active Problem List   Diagnosis Date Noted  . Encounter for adjustment of biventricular implantable cardioverter-defibrillator (ICD) 06/13/2019  . Palpitations 12/05/2018  . OSA (obstructive sleep apnea) 11/04/2018  . Lung disease 01/17/2017  . RLS (restless legs syndrome) 01/17/2017  . Gastroesophageal reflux disease without esophagitis 11/30/2016  . Chest pain 02/08/2016   . Sarcoidosis 02/08/2016  . Benign essential HTN 02/08/2016  . Nonischemic cardiomyopathy (HCC) 02/08/2016  . DM (diabetes mellitus), type 2 (HCC) 02/08/2016  . Chronic systolic CHF (congestive heart failure) (HCC) 02/08/2016  . Acquired hypothyroidism 09/02/2015  . Heart failure with reduced left ventricular function (HCC) 09/02/2015  . Hyperlipidemia 09/02/2015  . PSVT (paroxysmal supraventricular tachycardia) (HCC) 09/02/2015  . ICD: [employer] Bi-V ICD (Dynagen X4 CRT-D) MRI compatible ICD in situ 01/13/2015    Past Surgical History:  Procedure Laterality Date  . ABDOMINAL SURGERY  12/20/2020    laparoscopic sleeve gastrectomy  . CARDIAC CATHETERIZATION N/A 02/08/2016   Procedure: Left Heart Cath and Coronary Angiography;  Surgeon: Corky Crafts, MD;  Location: [facility] INVASIVE CV LAB;  Service: Cardiovascular;  Laterality: N/A;  . CATARACT EXTRACTION Right 12/04/2018  . CESAREAN SECTION    . CHOLECYSTECTOMY    . INSERTION OF ICD     a. s/p dual-chamber [employer] ICD 12/2014  . LAPAROSCOPIC GASTRIC SLEEVE RESECTION    . PACEMAKER INSERTION    . TUBAL LIGATION       OB History   No obstetric history on file.     Family History  Problem Relation Age of Onset  . Heart attack Father        a. Initial onset in his 59's. S/p CABG  . Heart failure Father     Social History   Tobacco Use  . Smoking status: Former Smoker    Packs/day: 1.00  Years: 20.00    Pack years: 20.00    Types: Cigarettes    Quit date: 2016    Years since quitting: 6.2  . Smokeless tobacco: Never Used  Vaping Use  . Vaping Use: Never used  Substance Use Topics  . Alcohol use: No    Alcohol/week: 0.0 standard drinks  . Drug use: No    Home Medications Prior to Admission medications   Medication Sig Start Date End Date Taking? Authorizing Provider  carvedilol (COREG) 25 MG tablet TAKE 1 TABLET (25 MG TOTAL) BY MOUTH 2 (TWO) TIMES DAILY WITH A MEAL. 10/19/20   Yates Decamp,  MD  folic acid (FOLVITE) 1 MG tablet Take 3 mg by mouth daily.     [provider]  furosemide (LASIX) 40 MG tablet TAKE 2 TABLETS BY MOUTH DAILY AS NEEDED FOR SHORTNESS OF BREATH Patient taking differently: Take by mouth daily as needed. 1 tablet Shortness of breath 01/29/20   Toniann Fail, NP  hydrOXYzine (VISTARIL) 25 MG capsule Take 25 mg by mouth as needed. 11/26/20   [provider]  ivabradine (CORLANOR) 7.5 MG TABS tablet Take 1 tablet (7.5 mg total) by mouth 2 (two) times daily with a meal. 01/12/21   Yates Decamp, MD  LORazepam (ATIVAN) 0.5 MG tablet Take by mouth as needed. 12/22/19   [provider]  pantoprazole (PROTONIX) 40 MG tablet Take 1 tablet by mouth daily. 01/23/20 01/12/21  [provider]  potassium chloride (K-DUR) 10 MEQ tablet Take 10 mEq by mouth daily.    [provider]  sacubitril-valsartan (ENTRESTO) 24-26 MG Take 1 tablet by mouth 2 (two) times daily. 01/12/21   Yates Decamp, MD  sacubitril-valsartan (ENTRESTO) 49-51 MG Take 1 tablet by mouth 2 (two) times daily. Start after you have finished 24/26 mg dosage 01/12/21   Yates Decamp, MD  Semaglutide,0.25 or 0.5MG /DOS, (OZEMPIC, 0.25 OR 0.5 MG/DOSE,) 2 MG/1.5ML [facility]  04/07/20   [provider]  Sertraline HCl 150 MG CAPS Take 150 mg by mouth daily.    [provider]  Vitamin D, Ergocalciferol, (DRISDOL) 1.25 MG (50000 UT) CAPS capsule Take 50,000 Units by mouth once a week. 09/20/18   [provider]    Allergies    Patient has no known allergies.  Review of Systems   Review of Systems  Constitutional: Negative for chills and fever.  HENT: Negative for ear pain and sore throat.   Eyes: Negative for pain and visual disturbance.  Respiratory: Negative for cough and shortness of breath.   Cardiovascular: Negative for chest pain and palpitations.  Gastrointestinal: Positive for abdominal pain. Negative for vomiting.  Genitourinary: Negative for  dysuria and hematuria.  Musculoskeletal: Negative for arthralgias and back pain.  Skin: Negative for color change and rash.  Neurological: Negative for seizures and syncope.  All other systems reviewed and are negative.   Physical Exam Updated Vital Signs BP 129/82 (BP Location: Left Arm)   Pulse 75   Temp 98.5 F (36.9 C) (Oral)   Resp 18   Ht 5\' 7"  (1.702 m)   Wt 115.7 kg   LMP 04/17/2016   SpO2 98%   BMI 39.94 kg/m   Physical Exam Vitals and nursing note reviewed.  Constitutional:      General: She is not in acute distress.    Appearance: She is well-developed.  HENT:     Head: Normocephalic and atraumatic.  Eyes:     Conjunctiva/sclera: Conjunctivae normal.  Cardiovascular:  Rate and Rhythm: Normal rate and regular rhythm.     Heart sounds: No murmur heard.   Pulmonary:     Effort: Pulmonary effort is normal. No respiratory distress.     Breath sounds: Normal breath sounds.  Abdominal:     Palpations: Abdomen is soft.     Tenderness: There is abdominal tenderness in the right lower quadrant.  Musculoskeletal:     Cervical back: Neck supple.  Skin:    General: Skin is warm and dry.  Neurological:     Mental Status: She is alert.     ED Results / Procedures / Treatments   Labs (all labs ordered are listed, but only abnormal results are displayed) Labs Reviewed  LIPASE, BLOOD - Abnormal; Notable for the following components:      Result Value   Lipase 63 (*)    All other components within normal limits  COMPREHENSIVE METABOLIC PANEL - Abnormal; Notable for the following components:   BUN 22 (*)    ALT 56 (*)    Total Bilirubin 0.2 (*)    All other components within normal limits  URINALYSIS, ROUTINE W REFLEX MICROSCOPIC - Abnormal; Notable for the following components:   Glucose, UA >=500 (*)    All other components within normal limits  URINALYSIS, MICROSCOPIC (REFLEX) - Abnormal; Notable for the following components:   Bacteria, UA RARE (*)     All other components within normal limits  CBC  PREGNANCY, URINE    EKG None  Radiology CT ABDOMEN PELVIS W CONTRAST  Result Date: 02/08/2021 CLINICAL DATA:  Right lower quadrant and right groin pain since last evening. EXAM: CT ABDOMEN AND PELVIS WITH CONTRAST TECHNIQUE: Multidetector CT imaging of the abdomen and pelvis was performed using the standard protocol following bolus administration of intravenous contrast. CONTRAST:  OMNIPAQUE IOHEXOL 300 MG/ML  SOLN COMPARISON:  03/12/2020 FINDINGS: Lower chest: There is a small pericardial effusion noted. This was not present on the prior study. The pacer wires are stable. The lung bases are clear except for minimal streaky left basilar atelectasis. No pleural effusions. Hepatobiliary: No hepatic lesions or intrahepatic biliary dilatation. The gallbladder is surgically absent. No common bile duct dilatation. Pancreas: No mass, inflammation or ductal dilatation. Spleen: Normal size. No focal lesions. Adrenals/Urinary Tract: Adrenal glands are normal. The kidneys are unremarkable. The bladder is unremarkable. Stomach/Bowel: Surgical changes from a sleeve gastrectomy are new since the prior study. No complicating features are identified. The small bowel and colon are unremarkable. Low lying cecum in the pelvis. The appendix is normal. Vascular/Lymphatic: The aorta is normal in caliber. No dissection. The branch vessels are patent. The major venous structures are patent. No mesenteric or retroperitoneal mass or adenopathy. Small scattered lymph nodes are noted. Reproductive: The uterus and ovaries are unremarkable. Other: No inguinal mass, adenopathy or hernia. Musculoskeletal: No acute bony findings. IMPRESSION: 1. No acute abdominal/pelvic findings, mass lesions or adenopathy. 2. Surgical changes from sleeve gastrectomy. No complicating features are identified. 3. Status post cholecystectomy. No biliary dilatation. Electronically Signed   By: Rudie Meyer M.D.   On: 02/08/2021 21:10    Procedures Procedures   Medications Ordered in ED Medications  fentaNYL (SUBLIMAZE) injection 50 mcg (50 mcg Intravenous Given 02/08/21 2013)  sodium chloride 0.9 % bolus 1,000 mL (0 mLs Intravenous Stopped 02/08/21 2132)  iohexol (OMNIPAQUE) 300 MG/ML solution 100 mL (100 mLs Intravenous Contrast Given 02/08/21 2048)    ED Course  I have reviewed the triage vital signs  and the nursing notes.  Pertinent labs & imaging results that were available during my care of the patient were reviewed by me and considered in my medical decision making (see chart for details).    MDM Rules/Calculators/A&P                          Cyani Kallstrom is here with right lower quadrant abdominal pain.  History of multiple abdominal surgeries.  Denies any nausea, vomiting, diarrhea.  Concern for appendicitis versus bowel obstruction versus UTI.  No history of kidney stones.  Will check basic labs and CT scan abdomen pelvis.  CT scan showed no evidence of appendicitis.  No bowel obstruction.  No UTI.  No significant anemia, electrolyte ab Mody, kidney injury.  Overall unremarkable work-up.  Discharged in good condition.  This chart was dictated using voice recognition software.  Despite best efforts to proofread,  errors can occur which can change the documentation meaning.   Final Clinical Impression(s) / ED Diagnoses Final diagnoses:  Right lower quadrant abdominal pain    Rx / DC Orders ED Discharge Orders    None       Virgina Norfolk, DO 02/08/21 2155

## 2021-02-08 NOTE — ED Notes (Signed)
Reviewed discharge instructions.

## 2021-02-08 NOTE — ED Notes (Signed)
Patient transported to CT 

## 2021-02-08 NOTE — ED Notes (Signed)
C/O pain to right lower Abd/ groin.  Denies nausea or vomiting.  Radiates down right leg.

## 2021-03-24 NOTE — Progress Notes (Signed)
Primary Physician/Referring:  Henderson Baltimore, FNP  Patient ID: Alicia Cook, female    DOB: 1966-06-15, 55 y.o.   MRN: [id number]  Chief Complaint  Patient presents with  . Nonischemic cardiomyopathy  . low bp   HPI:    Alicia Cook  is a 55 y.o. 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 Cardiac MRI performed in 2011 and cardiac PET scan in August 2017 does not appear to be consistent with cardiac sarcoidosis.  She had repeat PET scan performed at [facility] on 03/05/2020 again confirming this.  She also has uncontrolled diabetes, morbid obesity, mild sleep apnea but not tolerating CPAP, systemic hypertension.  Patient underwent laparoscopic gastric sleeve on 12/20/2020 and so far has lost about 25 pounds in weight.  All her cardiac medications were discontinued as her blood pressure had been low.  Since bariatric surgery, her diabetic medications including statins have also been discontinued.  Patient presents for 3-week follow-up of CHF and cardiomyopathy.  At last visit restarted Entresto 24/26 mg, unfortunately repeat BMP is not available.  Also last visit restarted Corlanor 7.5 mg twice daily given elevated heart rate. However, patient has stopped taking Entresto, carvedilol, and Corlanor in the evenings due to hypotension with associated dizziness.  Patient denies chest pain, dyspnea, syncope, near syncope, dizziness, PND, orthopnea, leg edema.   Past Medical History:  Diagnosis Date  . Cataract   . CHF (congestive heart failure) ([place])   . Depression   . Diabetes mellitus without complication ([place])   . Encounter for adjustment of biventricular implantable cardioverter-defibrillator (ICD) 06/13/2019  . Hypertension   . ICD: Cardiac defibrillator in situ 01/13/2015   [employer] Bi-V ICD  (Dynagen X4 CRT-D) 01/13/2015 at [facility] - MRI Compatible  Scheduled Remote ICD check 4.22.20: 1 SVT/8 secs, normal function. Battery longevity is 8 years. RA pacing is 1 %, RV pacing is 99 %, and LV pacing is 99 %.  . Nonischemic cardiomyopathy ([place])    a. EF 35-40% by cath in 2011 with normal cors b. s/p ICD implantation in 12/2014 c. EF 25% by NST in 02/01/2016  . Sarcoidosis   . Sarcoidosis 02/08/2016   Past Surgical History:  Procedure Laterality Date  . ABDOMINAL SURGERY  12/20/2020    laparoscopic sleeve gastrectomy  . CARDIAC CATHETERIZATION N/A 02/08/2016   Procedure: Left Heart Cath and Coronary Angiography;  Surgeon: Jettie Booze, MD;  Location: [facility];  Service: Cardiovascular;  Laterality: N/A;  . CATARACT EXTRACTION Right 12/04/2018  . CESAREAN SECTION    . CHOLECYSTECTOMY    . INSERTION OF ICD     a. s/p dual-chamber [employer] ICD 12/2014  . LAPAROSCOPIC GASTRIC SLEEVE RESECTION    . PACEMAKER INSERTION    . TUBAL LIGATION     Family History  Problem Relation Age of Onset  . Heart attack Father        a. Initial onset in his 72's. S/p CABG  . Heart failure Father     Social History   Tobacco Use  . Smoking status: Former Smoker    Packs/day: 1.00    Years: 20.00    Pack years: 20.00    Types: Cigarettes    Quit date: 2016    Years since quitting: 6.4  . Smokeless tobacco: Never Used  Substance Use Topics  . Alcohol use:  No    Alcohol/week: 0.0 standard drinks   Marital Status: Single  ROS  Review of Systems  Constitutional: Negative for malaise/fatigue and weight gain.  Cardiovascular: Negative for chest pain, claudication, dyspnea on exertion, leg swelling, near-syncope, orthopnea, palpitations, paroxysmal nocturnal dyspnea and syncope.  Respiratory: Negative for shortness of breath.   Hematologic/Lymphatic: Does not bruise/bleed easily.  Gastrointestinal: Negative for melena.  Neurological: Positive for dizziness.  Negative for weakness.   Objective  Blood pressure 110/71, pulse 97, temperature 97.7 F (36.5 C), height $RemoveBe'5\' 7"'ulMDpJdTB$  (1.702 m), weight 244 lb (110.7 kg), last menstrual period 04/17/2016, SpO2 99 %.  Vitals with BMI 03/25/2021 02/08/2021 02/08/2021  Height $Remov'5\' 7"'IrAXdz$  - -  Weight 244 lbs - -  BMI 82.50 - -  Systolic 539 767 341  Diastolic 71 82 70  Pulse 97 75 96     Physical Exam Vitals reviewed.  Constitutional:      General: She is not in acute distress.    Appearance: She is well-developed.     Comments: Morbidly obese in no acute distress.  Neck:     Thyroid: No thyromegaly.  Cardiovascular:     Rate and Rhythm: Regular rhythm. Tachycardia present.     Pulses: Intact distal pulses.          Carotid pulses are 2+ on the right side and 2+ on the left side.      Dorsalis pedis pulses are 2+ on the right side and 2+ on the left side.       Posterior tibial pulses are 2+ on the right side and 2+ on the left side.     Heart sounds: Normal heart sounds, S1 normal and S2 normal. No murmur heard. No gallop.      Comments: No leg edema, no JVD.  Pulmonary:     Effort: Pulmonary effort is normal. No accessory muscle usage or respiratory distress.     Breath sounds: Normal breath sounds. No wheezing, rhonchi or rales.  Abdominal:     Comments: Obese. Pannus present  Musculoskeletal:     Right lower leg: No edema.     Left lower leg: No edema.  Neurological:     Mental Status: She is alert.    Laboratory examination:   External labs:   Labs 12/22/2020:  Hb 12.1/HCT 36.8, platelets 227.  Sodium 136, potassium 3.8, BUN 7, creatinine 0.69, EGFR >90 mL.  Serum glucose 106 mg.  08/31/2020: A1c 7.6%  07/20/2020: Glucose 101, sodium 139, potassium 4.2, BUN 12, creatinine 0.75, EGFR greater than 90 Hemoglobin 13.0, hematocrit 39.8, platelets 250   Lab 03/04/2020: BUN 14, creatinine 0.8, serum glucose 140 mg, EGFR >60 mL.  Magnesium 1.6. Total cholesterol 124, triglycerides 129, HDL 55,  LDL 63.  Non-HDL cholesterol 69. Hb 13.1/HCT 39.3, platelets 207. Allergies  No Known Allergies    Medications Prior to Visit:   Outpatient Medications Prior to Visit  Medication Sig Dispense Refill  . carvedilol (COREG) 25 MG tablet TAKE 1 TABLET (25 MG TOTAL) BY MOUTH 2 (TWO) TIMES DAILY WITH A MEAL. (Patient taking differently: Take 25 mg by mouth daily.) 937 tablet 2  . folic acid (FOLVITE) 1 MG tablet Take 3 mg by mouth daily.     . furosemide (LASIX) 40 MG tablet TAKE 2 TABLETS BY MOUTH DAILY AS NEEDED FOR SHORTNESS OF BREATH (Patient taking differently: Take by mouth daily as needed. 1 tablet Shortness of breath) 60 tablet 3  . hydrOXYzine (VISTARIL) 25 MG capsule Take  25 mg by mouth as needed.    . ivabradine (CORLANOR) 7.5 MG TABS tablet Take 1 tablet (7.5 mg total) by mouth 2 (two) times daily with a meal. 60 tablet 3  . LORazepam (ATIVAN) 0.5 MG tablet Take by mouth as needed.    . pantoprazole (PROTONIX) 40 MG tablet Take 1 tablet by mouth daily.    . potassium chloride (K-DUR) 10 MEQ tablet Take 10 mEq by mouth daily.    . Semaglutide,0.25 or 0.5MG/DOS, (OZEMPIC, 0.25 OR 0.5 MG/DOSE,) 2 MG/1.5ML SOPN     . Sertraline HCl 150 MG CAPS Take 150 mg by mouth daily.    . Vitamin D, Ergocalciferol, (DRISDOL) 1.25 MG (50000 UT) CAPS capsule Take 50,000 Units by mouth once a week.    . sacubitril-valsartan (ENTRESTO) 24-26 MG Take 1 tablet by mouth 2 (two) times daily. (Patient taking differently: Take 1 tablet by mouth daily.) 28 tablet 0  . JARDIANCE 25 MG TABS tablet Take 25 mg by mouth daily.    . sacubitril-valsartan (ENTRESTO) 49-51 MG Take 1 tablet by mouth 2 (two) times daily. Start after you have finished 24/26 mg dosage (Patient not taking: Reported on 03/25/2021) 28 tablet 0   No facility-administered medications prior to visit.     Final Medications at End of Visit    Current Meds  Medication Sig  . carvedilol (COREG) 25 MG tablet TAKE 1 TABLET (25 MG TOTAL) BY MOUTH 2  (TWO) TIMES DAILY WITH A MEAL. (Patient taking differently: Take 25 mg by mouth daily.)  . folic acid (FOLVITE) 1 MG tablet Take 3 mg by mouth daily.   . furosemide (LASIX) 40 MG tablet TAKE 2 TABLETS BY MOUTH DAILY AS NEEDED FOR SHORTNESS OF BREATH (Patient taking differently: Take by mouth daily as needed. 1 tablet Shortness of breath)  . hydrOXYzine (VISTARIL) 25 MG capsule Take 25 mg by mouth as needed.  . ivabradine (CORLANOR) 7.5 MG TABS tablet Take 1 tablet (7.5 mg total) by mouth 2 (two) times daily with a meal.  . LORazepam (ATIVAN) 0.5 MG tablet Take by mouth as needed.  . pantoprazole (PROTONIX) 40 MG tablet Take 1 tablet by mouth daily.  . potassium chloride (K-DUR) 10 MEQ tablet Take 10 mEq by mouth daily.  . Semaglutide,0.25 or 0.5MG/DOS, (OZEMPIC, 0.25 OR 0.5 MG/DOSE,) 2 MG/1.5ML SOPN   . Sertraline HCl 150 MG CAPS Take 150 mg by mouth daily.  . Vitamin D, Ergocalciferol, (DRISDOL) 1.25 MG (50000 UT) CAPS capsule Take 50,000 Units by mouth once a week.  . [DISCONTINUED] sacubitril-valsartan (ENTRESTO) 24-26 MG Take 1 tablet by mouth 2 (two) times daily. (Patient taking differently: Take 1 tablet by mouth daily.)    Radiology:   No results found.  Cardiac Studies:   Nuclear stress test 02/01/2016: No significant ST segment changes or arrhythmias were noted during stress  Negative ETT for ischemia at workload achieved  No significant chest pain symptoms reported  No significant arrhythmia noted  nuclear images report to follow  Right heart catheterization and myocardial biopsy 07/17/2016: Mild pulmonary hypertension, mildly elevated pulmonary capillary wedge pressure, biopsy negative for sarcoid.  Cardiac PET sarcoidosis with nuclear medicine perfusion 03/05/2020: A myocardial SPECT scan was performed at rest and contains a large, severe perfusion defect involving the anterior, lateral and anterior lateral walls of the left ventricle, necessitating a FDG-PET viability  study.  Physiologic distribution of radiotracer. No substantial accumulation of radiotracer in the area of perfusion defect. Other PET findings: Few FDG avid  bilateral axillary lymph nodes, index right axillary node measuring 1 cm (SUV max 3.5). Additionally, there are a few hypermetabolic superior mediastinal lymph nodes. Index right upper paratracheal lymph node shows SUV max 2.2. CT: Few small nodules are noted in right upper lobe, similar to 12/02/2019 CT exam. Subsegmental atelectasis/scarring in left lower lobe. Left chest wall cardiac rhythm maintenance device with distal leads terminating in right atrial appendage and right ventricle and coronary sinus. Small fat-containing of umbilical hernia.   PCV ECHOCARDIOGRAM COMPLETE 09/03/2020 Left ventricle cavity is normal in size. Moderate concentric hypertrophy of the left ventricle. Moderately depressed LV systolic function with EF 35%. Left ventricle regional wall motion findings: Basal inferolateral and Mid inferolateral akinesis.  Severe global hypokinesis. Calculated EF 35%. No significant valvular abnormality. RV normal in size and function. Pacemaker lead seen. Small pericardial effusion with clear fluids. Compared to 06/26/2017, EF decreased form 40-45%. Inferior akinesis new.   ICD   Scheduled Remote ICD check 10/05/2020:  There were 2 atrial high rate episodes detected. The longest lasted 47 seconds in duration. No EGMs available for review. There was a <1 % cumulative atrial arrhythmia burden. No VHR episodes.  Health trends (patient activity, heart rate variability, average heart rates) are stable. The review of the implantable cardiac monitoring data remains stable. Battery longevity is 6 years . RA pacing is 1 %, RV pacing is 98 %, and LV pacing is 98 %.   EKG   EKG 10/12/2020: AV paced rhythm at rate of 84 bpm. Biventricular pacing detected. Wide QRS complex. No further analysis.   EKG 03/12/2020: Underlying sinus rhythm  normal sinus rhythm at the rate of 83 bpm, ventricularly paced rhythm.  No further analysis.  Biventricular pacemaker detected.    09/15/2019: V paced rhythm, no further analysis.   Assessment     ICD-10-CM   1. Nonischemic cardiomyopathy (HCC)  I42.8 EKG 12-Lead    sacubitril-valsartan (ENTRESTO) 24-26 MG    PCV ECHOCARDIOGRAM COMPLETE  2. Chronic systolic CHF (congestive heart failure) (HCC)  I50.22 EKG 12-Lead    sacubitril-valsartan (ENTRESTO) 24-26 MG  3. Benign essential HTN  I10      Meds ordered this encounter  Medications  . sacubitril-valsartan (ENTRESTO) 24-26 MG    Sig: Take 1 tablet by mouth 2 (two) times daily.    Dispense:  180 tablet    Refill:  3    Medications Discontinued During This Encounter  Medication Reason  . sacubitril-valsartan (ENTRESTO) 49-51 MG Change in therapy  . sacubitril-valsartan (ENTRESTO) 24-26 MG Reorder    Recommendations:   Timesha Cervantez  is a 55 y.o. 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 Cardiac MRI performed in 2011 and cardiac PET scan in August 2017 does not appear to be consistent with cardiac sarcoidosis.  She had repeat PET scan performed at [facility] on 03/05/2020 again confirming this.  She also has uncontrolled diabetes, morbid obesity, mild sleep apnea but not tolerating CPAP, systemic hypertension.  Patient underwent laparoscopic gastric sleeve on 12/20/2020 and so far has lost about 25 pounds in weight.  All her cardiac medications were discontinued as her blood pressure had been low.  Since bariatric surgery, her diabetic medications including statins have also been discontinued.  Patient presents for 3-week follow-up of CHF and cardiomyopathy.  At last visit restarted Entresto 24/26 mg, unfortunately repeat BMP is not available.  Also last visit  restarted Corlanor 7.5 mg twice daily given elevated heart rate.  Patient had independently discontinued evening doses of carvedilol, Entresto, and Corlanor due to episodes of dizziness and low blood pressure.  We will therefore reduce Entresto from 49/58m to 24/26 mg twice daily.  Given patient's heart rate >70 bpm advised patient to continue to take carvedilol 25 mg twice daily and Corlanor 7.5 mg twice daily.  Counseled patient that if she continues to have episodes of dizziness and hypotension can consider reducing carvedilol to 12.5 mg twice daily.  Patient will notify our office if she continues to have issues.  During today's office visit patient appears well compensated without clinical evidence of heart failure.  Patient previously been on spironolactone, but it was discontinued due to soft blood pressure.  Would like to consider restarting spironolactone in the future if tolerated.  Will plan to repeat echo in November 2022.  Follow-up in 3 months, sooner if needed, for heart failure, cardiomyopathy.   CAlethia Berthold PA-C 03/28/2021, 6:35 PM Office: [phone]

## 2021-03-25 ENCOUNTER — Other Ambulatory Visit: Payer: Self-pay

## 2021-03-25 ENCOUNTER — Encounter: Payer: Self-pay | Admitting: Student

## 2021-03-25 ENCOUNTER — Ambulatory Visit: Payer: Medicaid Other | Admitting: Student

## 2021-03-25 VITALS — BP 110/71 | HR 97 | Temp 97.7°F | Ht 67.0 in | Wt 244.0 lb

## 2021-03-25 DIAGNOSIS — I10 Essential (primary) hypertension: Secondary | ICD-10-CM

## 2021-03-25 DIAGNOSIS — I428 Other cardiomyopathies: Secondary | ICD-10-CM

## 2021-03-25 DIAGNOSIS — I5022 Chronic systolic (congestive) heart failure: Secondary | ICD-10-CM

## 2021-03-25 MED ORDER — ENTRESTO 24-26 MG PO TABS: 1.0000 | ORAL_TABLET | Freq: Two times a day (BID) | ORAL | 3 refills | Status: DC

## 2021-03-25 NOTE — Patient Instructions (Signed)
Eating Plan After Bariatric Surgery, Stage 3 A diet after weight loss surgery (bariatric surgery) should provide plenty of fluids and nutrients while promoting weight loss and healing. Each stage of recovery has a different set of food and drink recommendations. Your health care provider may recommend that you work with a diet and nutrition specialist (dietitian) to make a staged eating plan that is right for you. You may start to transition to a stage 3 diet about 5-6 weeks after surgery. During this stage, you will be allowed to eat foods of various textures. Continue to follow the guidelines below until your health care provider or dietitian approves eating a regular weight-maintenance diet. What are tips for following this plan? Cooking  Use low-fat cooking methods, such as baking, broiling, boiling, or grilling.  Cook using healthy fats, such as olive, sunflower, grapeseed, or canola oil.  Cook and bake using no-calorie sweeteners.  Avoid adding extra salt, sugar, or fat to foods when cooking. Meal planning  Eat 3 meals and 2 snacks, or 5 small meals each day.  Eat at set times. Allow 30-45 minutes for each meal.  Do not skip meals or go a long time without eating (fast). If you are having a hard time eating, talk to your health care provider or dietitian.  Limit food intake to -1 cup per meal. As you heal and advance, you may be able to eat a little more with each meal. Always listen to your body.  Eat foods from all food groups. This includes fruits and vegetables, grains, dairy, and meat and other proteins.  Limit carbohydrate intake to no more than 30 g per meal or 130 g per day. There are about 15-20 g of carbohydrates in 1 piece of bread or a medium piece of fruit. Half of your total grains should be whole grains.  Include a protein-rich food at every meal and snack, and eat the protein food first. Eat 60-80 g of protein a day when possible. General instructions  Work with  your dietitian to slowly add new foods to your diet.  Stay hydrated. Drink at least 48-64 oz of noncarbonated, zero-calorie fluid per day. Water is the best choice.  Limit alcohol intake as directed by your health care provider.  Avoid foods that are high in fat or have added sugar.  Take any vitamin supplements as directed by your health care provider.  Ask your dietitian to help you with meal planning and strategies to continue to lose weight and maintain weight loss. Recommended foods Fruits  All fresh, frozen, or canned fruit. Vegetables  All fresh, frozen, or canned vegetables. Grains  Whole wheat bread, crackers, and pasta.  Rice.  Unsweetened hot and cold cereals. Meats and other protein foods  Lean meat, poultry, and fish.  Eggs and egg substitutes.  Beans and lentils. Smooth nut butters.  Soy products, such as tempeh and tofu. Dairy  Low or nonfat milk, cheese, and yogurt.  Sugar-free pudding.  Cottage cheese. Beverages  Decaffeinated coffee and tea.  Sugar-free, caffeine-free soft drinks. Fats and oils  Avocado.  Olive, sunflower, grapeseed, or canola oil. Sweets and desserts  Low-fat, sugar-free desserts. Seasoning and other foods  Low-fat, low-sodium condiments. The items listed above may not be a complete list of foods and beverages you can eat. Contact a dietitian for more information.   Foods to avoid  Hard-to-digest foods, including: ? Popcorn. ? Nuts and seeds. ? Celery. ? White parts of citrus fruits (pith).  Caffeinated drinks,  including: ? Coffee and tea. ? Energy drinks.  Sweets and desserts with more than 25 g of sugar per serving. The items listed above may not be a complete list of foods and beverages you should avoid. Contact a dietitian for more information. Summary  Stage 3 diet starts about 5-6 weeks after surgery. Continue to follow stage 3 guidelines until your health care provider or dietitian approves eating a  regular weight-maintenance diet.  Stage 3 diet involves eating a balanced, healthy diet with foods of various textures. This diet helps you continue to lose weight and maintain weight loss.  Eating enough protein and drinking plenty of fluids is important for promoting weight loss and healing after surgery. This information is not intended to replace advice given to you by your health care provider. Make sure you discuss any questions you have with your health care provider. Document Revised: 02/19/2020 Document Reviewed: 02/19/2020 Elsevier Patient Education  2021 [employer] Diabetes Mellitus and Nutrition, Adult When you have diabetes, or diabetes mellitus, it is very important to have healthy eating habits because your blood sugar (glucose) levels are greatly affected by what you eat and drink. Eating healthy foods in the right amounts, at about the same times every day, can help you:  Control your blood glucose.  Lower your risk of heart disease.  Improve your blood pressure.  Reach or maintain a healthy weight. What can affect my meal plan? Every person with diabetes is different, and each person has different needs for a meal plan. Your health care provider may recommend that you work with a dietitian to make a meal plan that is best for you. Your meal plan may vary depending on factors such as:  The calories you need.  The medicines you take.  Your weight.  Your blood glucose, blood pressure, and cholesterol levels.  Your activity level.  Other health conditions you have, such as heart or kidney disease. How do carbohydrates affect me? Carbohydrates, also called carbs, affect your blood glucose level more than any other type of food. Eating carbs naturally raises the amount of glucose in your blood. Carb counting is a method for keeping track of how many carbs you eat. Counting carbs is important to keep your blood glucose at a healthy level, especially if you use insulin  or take certain oral diabetes medicines. It is important to know how many carbs you can safely have in each meal. This is different for every person. Your dietitian can help you calculate how many carbs you should have at each meal and for each snack. How does alcohol affect me? Alcohol can cause a sudden decrease in blood glucose (hypoglycemia), especially if you use insulin or take certain oral diabetes medicines. Hypoglycemia can be a life-threatening condition. Symptoms of hypoglycemia, such as sleepiness, dizziness, and confusion, are similar to symptoms of having too much alcohol.  Do not drink alcohol if: ? Your health care provider tells you not to drink. ? You are pregnant, may be pregnant, or are planning to become pregnant.  If you drink alcohol: ? Do not drink on an empty stomach. ? Limit how much you use to:  0-1 drink a day for women.  0-2 drinks a day for men. ? Be aware of how much alcohol is in your drink. In the [place] one drink equals one 12 oz bottle of beer (355 mL), one 5 oz glass of wine (148 mL), or one 1 oz glass of hard liquor (44 mL). ?  Keep yourself hydrated with water, diet soda, or unsweetened iced tea.  Keep in mind that regular soda, juice, and other mixers may contain a lot of sugar and must be counted as carbs. What are tips for following this plan? Reading food labels  Start by checking the serving size on the "Nutrition Facts" label of packaged foods and drinks. The amount of calories, carbs, fats, and other nutrients listed on the label is based on one serving of the item. Many items contain more than one serving per package.  Check the total grams (g) of carbs in one serving. You can calculate the number of servings of carbs in one serving by dividing the total carbs by 15. For example, if a food has 30 g of total carbs per serving, it would be equal to 2 servings of carbs.  Check the number of grams (g) of saturated fats and trans fats in one serving.  Choose foods that have a low amount or none of these fats.  Check the number of milligrams (mg) of salt (sodium) in one serving. Most people should limit total sodium intake to less than 2,300 mg per day.  Always check the nutrition information of foods labeled as "low-fat" or "nonfat." These foods may be higher in added sugar or refined carbs and should be avoided.  Talk to your dietitian to identify your daily goals for nutrients listed on the label. Shopping  Avoid buying canned, pre-made, or processed foods. These foods tend to be high in fat, sodium, and added sugar.  Shop around the outside edge of the grocery store. This is where you will most often find fresh fruits and vegetables, bulk grains, fresh meats, and fresh dairy. Cooking  Use low-heat cooking methods, such as baking, instead of high-heat cooking methods like deep frying.  Cook using healthy oils, such as olive, canola, or sunflower oil.  Avoid cooking with butter, cream, or high-fat meats. Meal planning  Eat meals and snacks regularly, preferably at the same times every day. Avoid going long periods of time without eating.  Eat foods that are high in fiber, such as fresh fruits, vegetables, beans, and whole grains. Talk with your dietitian about how many servings of carbs you can eat at each meal.  Eat 4-6 oz (112-168 g) of lean protein each day, such as lean meat, chicken, fish, eggs, or tofu. One ounce (oz) of lean protein is equal to: ? 1 oz (28 g) of meat, chicken, or fish. ? 1 egg. ?  cup (62 g) of tofu.  Eat some foods each day that contain healthy fats, such as avocado, nuts, seeds, and fish.   What foods should I eat? Fruits Berries. Apples. Oranges. Peaches. Apricots. Plums. Grapes. Mango. Papaya. Pomegranate. Kiwi. Cherries. Vegetables Lettuce. Spinach. Leafy greens, including kale, chard, collard greens, and mustard greens. Beets. Cauliflower. Cabbage. Broccoli. Carrots. Green beans. Tomatoes.  Peppers. Onions. Cucumbers. Brussels sprouts. Grains Whole grains, such as whole-wheat or whole-grain bread, crackers, tortillas, cereal, and pasta. Unsweetened oatmeal. Quinoa. Brown or wild rice. Meats and other proteins Seafood. Poultry without skin. Lean cuts of poultry and beef. Tofu. Nuts. Seeds. Dairy Low-fat or fat-free dairy products such as milk, yogurt, and cheese. The items listed above may not be a complete list of foods and beverages you can eat. Contact a dietitian for more information. What foods should I avoid? Fruits Fruits canned with syrup. Vegetables Canned vegetables. Frozen vegetables with butter or cream sauce. Grains Refined white flour and flour products  such as bread, pasta, snack foods, and cereals. Avoid all processed foods. Meats and other proteins Fatty cuts of meat. Poultry with skin. Breaded or fried meats. Processed meat. Avoid saturated fats. Dairy Full-fat yogurt, cheese, or milk. Beverages Sweetened drinks, such as soda or iced tea. The items listed above may not be a complete list of foods and beverages you should avoid. Contact a dietitian for more information. Questions to ask a health care provider  Do I need to meet with a diabetes educator?  Do I need to meet with a dietitian?  What number can I call if I have questions?  When are the best times to check my blood glucose? Where to find more information:  American Diabetes Association: diabetes.org  Academy of Nutrition and Dietetics: www.eatright.[employer] of Diabetes and Digestive and Kidney Diseases: CarFlippers.tn  Association of Diabetes Care and Education Specialists: www.diabeteseducator.org Summary  It is important to have healthy eating habits because your blood sugar (glucose) levels are greatly affected by what you eat and drink.  A healthy meal plan will help you control your blood glucose and maintain a healthy lifestyle.  Your health care provider  may recommend that you work with a dietitian to make a meal plan that is best for you.  Keep in mind that carbohydrates (carbs) and alcohol have immediate effects on your blood glucose levels. It is important to count carbs and to use alcohol carefully. This information is not intended to replace advice given to you by your health care provider. Make sure you discuss any questions you have with your health care provider. Document Revised: 09/23/2019 Document Reviewed: 09/23/2019 Elsevier Patient Education  2021 [employer].

## 2021-03-29 ENCOUNTER — Telehealth: Payer: Self-pay

## 2021-03-29 ENCOUNTER — Other Ambulatory Visit: Payer: Self-pay

## 2021-03-29 DIAGNOSIS — I428 Other cardiomyopathies: Secondary | ICD-10-CM

## 2021-03-29 DIAGNOSIS — I5022 Chronic systolic (congestive) heart failure: Secondary | ICD-10-CM

## 2021-03-29 MED ORDER — ENTRESTO 24-26 MG PO TABS: 1.0000 | ORAL_TABLET | Freq: Two times a day (BID) | ORAL | 3 refills | Status: DC

## 2021-03-29 NOTE — Telephone Encounter (Signed)
Pt called to advise that she is taking Entresto [id number] bid

## 2021-04-19 ENCOUNTER — Emergency Department (HOSPITAL_BASED_OUTPATIENT_CLINIC_OR_DEPARTMENT_OTHER): Payer: Medicaid Other

## 2021-04-19 ENCOUNTER — Encounter (HOSPITAL_COMMUNITY): Payer: Self-pay | Admitting: Emergency Medicine

## 2021-04-19 ENCOUNTER — Encounter (HOSPITAL_BASED_OUTPATIENT_CLINIC_OR_DEPARTMENT_OTHER): Payer: Self-pay

## 2021-04-19 ENCOUNTER — Other Ambulatory Visit: Payer: Self-pay

## 2021-04-19 ENCOUNTER — Emergency Department (HOSPITAL_COMMUNITY)
Admission: EM | Admit: 2021-04-19 | Discharge: 2021-04-20 | Discharge status: Against Medical Advice | Payer: Medicaid Other | Source: Home / Self Care

## 2021-04-19 ENCOUNTER — Emergency Department (HOSPITAL_BASED_OUTPATIENT_CLINIC_OR_DEPARTMENT_OTHER)
Admission: EM | Admit: 2021-04-19 | Discharge: 2021-04-19 | Disposition: A | Discharge status: Home / Self Care | Payer: Medicaid Other | Attending: Emergency Medicine | Admitting: Emergency Medicine

## 2021-04-19 DIAGNOSIS — R079 Chest pain, unspecified: Secondary | ICD-10-CM | POA: Insufficient documentation

## 2021-04-19 DIAGNOSIS — Z79899 Other long term (current) drug therapy: Secondary | ICD-10-CM | POA: Diagnosis not present

## 2021-04-19 DIAGNOSIS — E119 Type 2 diabetes mellitus without complications: Secondary | ICD-10-CM | POA: Insufficient documentation

## 2021-04-19 DIAGNOSIS — I110 Hypertensive heart disease with heart failure: Secondary | ICD-10-CM | POA: Insufficient documentation

## 2021-04-19 DIAGNOSIS — Z950 Presence of cardiac pacemaker: Secondary | ICD-10-CM | POA: Insufficient documentation

## 2021-04-19 DIAGNOSIS — Z87891 Personal history of nicotine dependence: Secondary | ICD-10-CM | POA: Diagnosis not present

## 2021-04-19 DIAGNOSIS — Z5321 Procedure and treatment not carried out due to patient leaving prior to being seen by health care provider: Secondary | ICD-10-CM | POA: Insufficient documentation

## 2021-04-19 DIAGNOSIS — I313 Pericardial effusion (noninflammatory): Secondary | ICD-10-CM | POA: Diagnosis not present

## 2021-04-19 DIAGNOSIS — I3139 Other pericardial effusion (noninflammatory): Secondary | ICD-10-CM

## 2021-04-19 DIAGNOSIS — I5022 Chronic systolic (congestive) heart failure: Secondary | ICD-10-CM | POA: Diagnosis not present

## 2021-04-19 LAB — COMPREHENSIVE METABOLIC PANEL
ALT: 65 U/L — ABNORMAL HIGH (ref 0–44)
AST: 55 U/L — ABNORMAL HIGH (ref 15–41)
Albumin: 4.1 g/dL (ref 3.5–5.0)
Alkaline Phosphatase: 93 U/L (ref 38–126)
Anion gap: 7 (ref 5–15)
BUN: 15 mg/dL (ref 6–20)
CO2: 26 mmol/L (ref 22–32)
Calcium: 9.4 mg/dL (ref 8.9–10.3)
Chloride: 102 mmol/L (ref 98–111)
Creatinine, Ser: 0.83 mg/dL (ref 0.44–1.00)
GFR, Estimated: >60 mL/min (ref >60)
Glucose, Bld: 100 mg/dL — ABNORMAL HIGH (ref 70–99)
Potassium: 3.9 mmol/L (ref 3.5–5.1)
Sodium: 135 mmol/L (ref 135–145)
Total Bilirubin: 0.3 mg/dL (ref 0.3–1.2)
Total Protein: 7.6 g/dL (ref 6.5–8.1)

## 2021-04-19 LAB — CBC
HCT: 43.6 % (ref 36.0–46.0)
Hemoglobin: 14 g/dL (ref 12.0–15.0)
MCH: 27.6 pg (ref 26.0–34.0)
MCHC: 32.1 g/dL (ref 30.0–36.0)
MCV: 86 fL (ref 80.0–100.0)
Platelets: 182 10*3/uL (ref 150–400)
RBC: 5.07 MIL/uL (ref 3.87–5.11)
RDW: 13.7 % (ref 11.5–15.5)
WBC: 9.5 10*3/uL (ref 4.0–10.5)
nRBC: 0 % (ref 0.0–0.2)

## 2021-04-19 LAB — BRAIN NATRIURETIC PEPTIDE: B Natriuretic Peptide: 195.4 pg/mL — ABNORMAL HIGH (ref 0.0–100.0)

## 2021-04-19 LAB — LIPASE, BLOOD: Lipase: 37 U/L (ref 11–51)

## 2021-04-19 LAB — TROPONIN I (HIGH SENSITIVITY)
Troponin I (High Sensitivity): 8 ng/L (ref <18)
Troponin I (High Sensitivity): 8 ng/L (ref <18)

## 2021-04-19 MED ORDER — IOHEXOL 350 MG/ML SOLN
100.0000 mL | Freq: Once | INTRAVENOUS | Status: AC | PRN
  Administered 2021-04-19: 100 mL via INTRAVENOUS

## 2021-04-19 MED ORDER — ACETAMINOPHEN 325 MG PO TABS
650.0000 mg | ORAL_TABLET | Freq: Once | ORAL | Status: AC
  Administered 2021-04-19: 650 mg via ORAL
  Filled 2021-04-19: qty 2

## 2021-04-19 NOTE — ED Notes (Signed)
Pt states she is going to leave now. EDP has been notified. Pt encouraged to stay and talk with the EDP about her care. Pt's IV removed per her request.

## 2021-04-19 NOTE — ED Notes (Signed)
Patient transported to CT 

## 2021-04-19 NOTE — ED Triage Notes (Signed)
Pt arrive by POV states she was send from [facility] for admission for fluids around her heart.

## 2021-04-19 NOTE — ED Provider Notes (Signed)
[facility] EMERGENCY DEPARTMENT Provider Note   CSN: [id number] Arrival date & time: 04/19/21  1449     History Chief Complaint  Patient presents with   Chest Pain     Alicia Cook is a 55 y.o. 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 Cardiac MRI performed in 2011 and cardiac PET scan in August 2017 does not appear to be consistent with cardiac sarcoidosis.  She had repeat PET scan performed at [facility] on 03/05/2020 again confirming this.  She also has uncontrolled diabetes, morbid obesity, mild  sleep apnea  but not tolerating CPAP, systemic  hypertension.  Presents to ER with concern for chest pain.  Patient reports chest pain has been relatively constant throughout the day.  Worse with deep breath.  No associated cough.  Pain is somewhat sharp, moderate in nature.  Nonradiating.  Denies previously having episodes of pain like this.  Does feel slightly more short of breath than normal.  Does not have any leg swelling or leg pain.  HPI     Past Medical History:  Diagnosis Date   Cataract    CHF (congestive heart failure) (HCC)    Depression    Diabetes mellitus without complication (HCC)    Encounter for adjustment of biventricular implantable cardioverter-defibrillator (ICD) 06/13/2019   Hypertension    ICD: Cardiac defibrillator in situ 01/13/2015   [employer] Bi-V ICD (Dynagen X4 CRT-D) 01/13/2015 at [facility] - MRI Compatible  Scheduled Remote ICD check 4.22.20: 1 SVT/8 secs, normal function. Battery longevity is 8 years. RA pacing is 1 %, RV pacing is 99 %, and LV pacing is 99 %.   Nonischemic cardiomyopathy (HCC)    a. EF 35-40% by cath in 2011 with normal cors b. s/p ICD implantation in 12/2014 c. EF 25% by NST in 02/01/2016   Sarcoidosis    Sarcoidosis 02/08/2016     Patient Active Problem List   Diagnosis Date Noted   Encounter for adjustment of biventricular implantable cardioverter-defibrillator (ICD) 06/13/2019   Palpitations 12/05/2018   OSA (obstructive sleep apnea) 11/04/2018   Lung disease 01/17/2017   RLS (restless legs syndrome) 01/17/2017   Gastroesophageal reflux disease without esophagitis 11/30/2016   Chest pain 02/08/2016   Sarcoidosis 02/08/2016   Benign essential HTN 02/08/2016   Nonischemic cardiomyopathy (HCC) 02/08/2016   DM (diabetes mellitus), type 2 (HCC) 02/08/2016   Chronic systolic CHF (congestive heart failure) (HCC) 02/08/2016   Acquired hypothyroidism 09/02/2015   Heart failure with reduced left ventricular function (HCC) 09/02/2015   Hyperlipidemia 09/02/2015   PSVT (paroxysmal supraventricular tachycardia) (HCC) 09/02/2015   ICD: [employer] Bi-V ICD (Dynagen X4 CRT-D) MRI compatible ICD in situ 01/13/2015    Past Surgical History:  Procedure Laterality Date   ABDOMINAL SURGERY  12/20/2020    laparoscopic sleeve gastrectomy   CARDIAC CATHETERIZATION N/A 02/08/2016   Procedure: Left Heart Cath and Coronary Angiography;  Surgeon: Corky Crafts, MD;  Location: [facility] INVASIVE CV LAB;  Service: Cardiovascular;  Laterality: N/A;   CATARACT EXTRACTION Right 12/04/2018   CESAREAN SECTION     CHOLECYSTECTOMY     INSERTION OF ICD     a. s/p dual-chamber [employer] ICD 12/2014   LAPAROSCOPIC GASTRIC SLEEVE RESECTION     PACEMAKER INSERTION     TUBAL LIGATION       OB History   No obstetric history  on file.     Family History  Problem Relation Age of Onset   Heart attack Father        a. Initial onset in his 65's. S/p CABG   Heart failure Father     Social History   Tobacco Use   Smoking status: Former    Packs/day: 1.00    Years: 20.00    Pack years: 20.00    Types: Cigarettes    Quit date: 2016    Years since quitting: 6.4   Smokeless tobacco: Never  Vaping Use   Vaping Use:  Never used  Substance Use Topics   Alcohol use: No    Alcohol/week: 0.0 standard drinks   Drug use: No    Home Medications Prior to Admission medications   Medication Sig Start Date End Date Taking? Authorizing Provider  carvedilol (COREG) 25 MG tablet TAKE 1 TABLET (25 MG TOTAL) BY MOUTH 2 (TWO) TIMES DAILY WITH A MEAL. Patient taking differently: Take 25 mg by mouth daily. 10/19/20   Yates Decamp, MD  folic acid (FOLVITE) 1 MG tablet Take 3 mg by mouth daily.     [provider]  furosemide (LASIX) 40 MG tablet TAKE 2 TABLETS BY MOUTH DAILY AS NEEDED FOR SHORTNESS OF BREATH Patient taking differently: Take by mouth daily as needed. 1 tablet Shortness of breath 01/29/20   Toniann Fail, NP  hydrOXYzine (VISTARIL) 25 MG capsule Take 25 mg by mouth as needed. 11/26/20   [provider]  ivabradine (CORLANOR) 7.5 MG TABS tablet Take 1 tablet (7.5 mg total) by mouth 2 (two) times daily with a meal. 01/12/21   Yates Decamp, MD  JARDIANCE 25 MG TABS tablet Take 25 mg by mouth daily. 02/14/21   [provider]  LORazepam (ATIVAN) 0.5 MG tablet Take by mouth as needed. 12/22/19   [provider]  pantoprazole (PROTONIX) 40 MG tablet Take 1 tablet by mouth daily. 01/23/20 03/25/21  [provider]  potassium chloride (K-DUR) 10 MEQ tablet Take 10 mEq by mouth daily.    [provider]  sacubitril-valsartan (ENTRESTO) 24-26 MG Take 1 tablet by mouth 2 (two) times daily. 03/29/21   Cantwell, Celeste C, PA-C  Semaglutide,0.25 or 0.5MG /DOS, (OZEMPIC, 0.25 OR 0.5 MG/DOSE,) 2 MG/1.5ML SOPN  04/07/20   [provider]  Sertraline HCl 150 MG CAPS Take 150 mg by mouth daily.    [provider]  Vitamin D, Ergocalciferol, (DRISDOL) 1.25 MG (50000 UT) CAPS capsule Take 50,000 Units by mouth once a week. 09/20/18   [provider]    Allergies    Patient has no known allergies.  Review of Systems   Review of Systems   Constitutional:  Negative for chills and fever.  HENT:  Negative for ear pain and sore throat.   Eyes:  Negative for pain and visual disturbance.  Respiratory:  Positive for shortness of breath. Negative for cough.   Cardiovascular:  Positive for chest pain. Negative for palpitations.  Gastrointestinal:  Negative for abdominal pain and vomiting.  Genitourinary:  Negative for dysuria and hematuria.  Musculoskeletal:  Negative for arthralgias and back pain.  Skin:  Negative for color change and rash.  Neurological:  Negative for seizures and syncope.  All other systems reviewed and are negative.  Physical Exam Updated Vital Signs BP 111/65   Pulse (!) 105   Temp 97.7 F (36.5 C) (Oral)   Resp (!) 21   Ht 5\' 7"  (1.702 m)   Wt  107.5 kg   LMP 04/17/2016   SpO2 98%   BMI 37.12 kg/m   Physical Exam Vitals and nursing note reviewed.  Constitutional:      General: She is not in acute distress.    Appearance: She is well-developed.  HENT:     Head: Normocephalic and atraumatic.  Eyes:     Conjunctiva/sclera: Conjunctivae normal.  Cardiovascular:     Rate and Rhythm: Normal rate and regular rhythm.     Heart sounds: No murmur heard. Pulmonary:     Effort: Pulmonary effort is normal. No respiratory distress.     Breath sounds: Normal breath sounds.  Abdominal:     Palpations: Abdomen is soft.     Tenderness: There is no abdominal tenderness.  Musculoskeletal:     Cervical back: Neck supple.  Skin:    General: Skin is warm and dry.  Neurological:     Mental Status: She is alert.    ED Results / Procedures / Treatments   Labs (all labs ordered are listed, but only abnormal results are displayed) Labs Reviewed  BRAIN NATRIURETIC PEPTIDE - Abnormal; Notable for the following components:      Result Value   B Natriuretic Peptide 195.4 (*)    All other components within normal limits  COMPREHENSIVE METABOLIC PANEL - Abnormal; Notable for the following components:    Glucose, Bld 100 (*)    AST 55 (*)    ALT 65 (*)    All other components within normal limits  CBC  LIPASE, BLOOD  TROPONIN I (HIGH SENSITIVITY)  TROPONIN I (HIGH SENSITIVITY)    EKG EKG Interpretation  Date/Time:  Tuesday April 19 2021 15:01:40 EDT Ventricular Rate:  113 PR Interval:    QRS Duration: 196 QT Interval:  436 QTC Calculation: 598 R Axis:   -65 Text Interpretation: Ventricular-paced rhythm Abnormal ECG Confirmed by Marianna Fuss ([id number]) on 04/19/2021 4:14:44 PM  Radiology DG Chest 2 View  Result Date: 04/19/2021 CLINICAL DATA:  Chest pain. Patient reports upper pain with breathing. EXAM: CHEST - 2 VIEW COMPARISON:  Radiograph 12/30/2020 at [facility]. Lung bases from abdominopelvic CT 02/08/2021 FINDINGS: Left-sided pacemaker remains in place. Cardiomegaly is stable. Increased density posterior to the heart border at the lung base may represent atrial enlargement or increasing pericardial effusion. No pulmonary edema, focal airspace disease, pneumothorax or significant pleural effusion. Thoracic spondylosis. IMPRESSION: Stable cardiomegaly. Increased density posterior to the heart border at the lung base may represent atrial enlargement or increasing pericardial effusion. Electronically Signed   By: Narda Rutherford M.D.   On: 04/19/2021 15:33   CT Angio Chest PE W and/or Wo Contrast  Result Date: 04/19/2021 CLINICAL DATA:  PE suspected, high prob Chest pain.  Patient has history of sarcoidosis. EXAM: CT ANGIOGRAPHY CHEST WITH CONTRAST TECHNIQUE: Multidetector CT imaging of the chest was performed using the standard protocol during bolus administration of intravenous contrast. Multiplanar CT image reconstructions and MIPs were obtained to evaluate the vascular anatomy. CONTRAST:  OMNIPAQUE IOHEXOL 350 MG/ML SOLN COMPARISON:  Radiograph earlier today. Chest CT 02/24/2018. Lung bases from abdominal CT 02/08/2021 FINDINGS: Cardiovascular: There are no filling  defects within the pulmonary arteries to suggest pulmonary embolus. Mild prominence of the main pulmonary artery at 3.4 cm. Left-sided pacemaker in place. Pericardial effusion has progressed from lung bases of prior abdominal CT, measures there is a 2-2.8 cm posterior to the left heart. Mediastinum/Nodes: Chronic mild mediastinal adenopathy which is similar to 2019 exam and likely related  to sarcoidosis. For example right lower paratracheal node measures 12 mm short axis, series 4, image 33. Prevascular node measures 10 mm series 4, image 29. Small bilateral hilar nodes are not enlarged by size criteria. No esophageal wall thickening. Tiny hiatal hernia. Thyroid calcifications without dominant nodule. Lungs/Pleura: Compressive atelectasis in the left lower lobe adjacent to pericardial effusion. There is also linear atelectasis in the perifissural left upper lobe. No pneumonia. Trachea and central bronchi are patent. No pleural fluid. Upper Abdomen: Gastric bypass anatomy. No acute upper abdominal findings. Musculoskeletal: There are no acute or suspicious osseous abnormalities. Mild scoliosis with diffuse degenerative change in the spine. Review of the MIP images confirms the above findings. IMPRESSION: 1. No pulmonary embolus. 2. Pericardial effusion has progressed from prior abdominal CT 2 months ago, now moderate to large in size measuring up to 2.8 cm posterior to the left heart. 3. Chronic mild mediastinal adenopathy is similar to 2019 exam and likely related to sarcoidosis. 4. Mild prominence of the main pulmonary artery suggesting pulmonary arterial hypertension. Electronically Signed   By: Narda Rutherford M.D.   On: 04/19/2021 18:43    Procedures Procedures   Medications Ordered in ED Medications  acetaminophen (TYLENOL) tablet 650 mg (650 mg Oral Given 04/19/21 1726)  iohexol (OMNIPAQUE) 350 MG/ML injection 100 mL (100 mLs Intravenous Contrast Given 04/19/21 1818)    ED Course  I have reviewed  the triage vital signs and the nursing notes.  Pertinent labs & imaging results that were available during my care of the patient were reviewed by me and considered in my medical decision making (see chart for details).    MDM Rules/Calculators/A&P                          55 year old lady presents to ER with concern for chest pain, shortness of breath.  On exam patient appears well in no distress.  Vital signs noted for mild tachycardia.  Basic labs stable, EKG unchanged, troponin within normal limits, doubt ACS.  BNP slightly elevated.  Given the somewhat pleuritic nature, check CTA chest.  Negative for PE but did demonstrate increase in size of pericardial effusion, now moderate to large.  Recommend patient remain in ER for further observation and await recommendations from cardiology.  Given the increase in size and her slight tachycardia, informed patient that may require admission for observation.  While awaiting callback from cardiology, patient left AMA.  After patient left, Dr. Jacinto Halim called back.  States vitals similar to last office visit.  Reviewed CT results.  Recommends close follow-up in clinic but okay for outpatient management at present.  We will attempt to call patient to update on these recommendations and stressed strict precautions should symptoms worsen.    Final Clinical Impression(s) / ED Diagnoses Final diagnoses:  Pericardial effusion    Rx / DC Orders ED Discharge Orders     None        Milagros Loll, MD 04/19/21 2013

## 2021-04-19 NOTE — ED Provider Notes (Signed)
Emergency Medicine Provider Triage Evaluation Note  Alicia Cook , a 55 y.o. female  was evaluated in triage.  Pt complains of chest pain.  Patient was seen in [facility] emergency department this morning and found to have enlarging pericardial effusion, CTA was negative for PE.  While awaiting cardiology consult, patient left [place] emergency department AMA with concern for her child being locked out of her home.  She states she was told Dr. Jacinto Halim may call her, if she did not hear from so she presented to the emergency department stating of her scared her family.  Upon chart review from Dr. Buena Irish note earlier today, it appears he did speak with Dr. Jacinto Halim who felt close outpatient follow-up with acceptable at this juncture.  Review of Systems  Positive: Chest pain, shortness of breath Negative: Palpitations, nausea, vomiting, diarrhea  Physical Exam  BP (!) 85/65 (BP Location: Left Arm)   Pulse (!) 122   Resp 20   LMP 04/17/2016   SpO2 99%  Gen:   Awake, no distress   Resp:  Normal effort  MSK:   Moves extremities without difficulty  Other:  RRR no M/R/G,  Medical Decision Making  Medically screening exam initiated at 9:56 PM.  Appropriate orders placed.  Alicia Cook was informed that the remainder of the evaluation will be completed by another provider, this initial triage assessment does not replace that evaluation, and the importance of remaining in the ED until their evaluation is complete.  It is just the patient that it is recommended she remain in the emergency department given her pericardial effusion and presenting hypotension.  Patient states that since Dr. Jacinto Halim said it was acceptable for her to follow-up in the outpatient setting she will likely not remain in the emergency department, but will call Dr. Tana Conch office first thing in the morning and return to the ER with worsening symptoms..  I informed her that I could not medically clear her as I was not the provider  to evaluate her earlier and her intake vital signs were concerning.  She voiced understanding.  Was informed this patient has eloped from the emergency department at this time.  She was told prior to her departure that her condition was serious.  This chart was dictated using voice recognition software, Dragon. Despite the best efforts of this provider to proofread and correct errors, errors may still occur which can change documentation meaning.    Sherrilee Gilles 04/19/21 2201    Pollyann Savoy, MD 04/19/21 2249

## 2021-04-19 NOTE — ED Triage Notes (Signed)
Pt c/o CP x today-NAD-steady gait 

## 2021-04-19 NOTE — ED Notes (Signed)
Pt. Left AMA because she was concerned about her dog and her front door being unlocked.  She is aware of test results and verbalized understanding seriousness of her condition as explained by Dr. Stevie Kern.  She states that once she is home she will try to call her cardiologist &/or go to [facility] ER.

## 2021-04-20 ENCOUNTER — Ambulatory Visit: Payer: Medicaid Other | Admitting: Student

## 2021-04-20 ENCOUNTER — Encounter: Payer: Self-pay | Admitting: Student

## 2021-04-20 VITALS — BP 97/71 | HR 115 | Temp 98.6°F | Resp 16 | Ht 67.0 in | Wt 234.0 lb

## 2021-04-20 DIAGNOSIS — I428 Other cardiomyopathies: Secondary | ICD-10-CM

## 2021-04-20 DIAGNOSIS — I5022 Chronic systolic (congestive) heart failure: Secondary | ICD-10-CM

## 2021-04-20 DIAGNOSIS — I3139 Other pericardial effusion (noninflammatory): Secondary | ICD-10-CM

## 2021-04-20 MED ORDER — IVABRADINE HCL 7.5 MG PO TABS: 7.5000 mg | ORAL_TABLET | Freq: Two times a day (BID) | ORAL | 3 refills | Status: DC

## 2021-04-20 MED ORDER — METOPROLOL SUCCINATE ER 25 MG PO TB24: 25.0000 mg | ORAL_TABLET | Freq: Every day | ORAL | 3 refills | Status: DC

## 2021-04-20 NOTE — ED Notes (Signed)
PT was called for vitals at 00:35 did not answer.

## 2021-04-20 NOTE — Progress Notes (Signed)
Primary Physician/Referring:  Henderson Baltimore, FNP  Patient ID: Alicia Cook, female    DOB: 1966-02-06, 55 y.o.   MRN: [id number]  No chief complaint on file.  HPI:    Alicia Cook  is a 55 y.o. 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 Cardiac MRI performed in 2011 and cardiac PET scan in August 2017 does not appear to be consistent with cardiac sarcoidosis.  She had repeat PET scan performed at [facility] on 03/05/2020 again confirming this.  She also has uncontrolled diabetes, morbid obesity, mild  sleep apnea  but not tolerating CPAP, systemic  hypertension.  Patient underwent laparoscopic gastric sleeve on 12/20/2020 and so far has lost about 55 pounds in weight.    Patient presented to [facility] emergency department 04/19/2021 with concerns of pleuritic chest pain.  CTA of the chest revealed moderate to large pericardial effusion, however patient eloped from emergency department before further evaluation and recommendations could be performed.  She now presents to our office for urgent follow-up.  Patient continues to complain of pleuritic chest pain as well as dyspnea on exertion.  Her other primary concern today is worsening constipation over the last few weeks, which she has been advised to follow-up with PCP regarding.  Patient reports her breathing and chest pain are better when she is lying flat.  She denies syncope, near syncope, dizziness, orthopnea, PND, leg edema.   Past Medical History:  Diagnosis Date   Cataract    CHF (congestive heart failure) ([place])    Depression    Diabetes mellitus without complication ([place])    Encounter for adjustment of biventricular implantable cardioverter-defibrillator (ICD) 06/13/2019   Hypertension    ICD: Cardiac defibrillator in situ 01/13/2015   [employer] Bi-V ICD (Dynagen X4 CRT-D) 01/13/2015 at [facility] - MRI Compatible  Scheduled Remote ICD check 4.22.20: 1 SVT/8 secs, normal function. Battery longevity is 8 years. RA pacing is 1 %, RV pacing is 99 %, and LV pacing is 99 %.   Nonischemic cardiomyopathy ([place])    a. EF 35-40% by cath in 2011 with normal cors b. s/p ICD implantation in 12/2014 c. EF 25% by NST in 02/01/2016   Sarcoidosis    Sarcoidosis 02/08/2016   Past Surgical History:  Procedure Laterality Date   ABDOMINAL SURGERY  12/20/2020    laparoscopic sleeve gastrectomy   CARDIAC CATHETERIZATION N/A 02/08/2016   Procedure: Left Heart Cath and Coronary Angiography;  Surgeon: Jettie Booze, MD;  Location: [facility];  Service: Cardiovascular;  Laterality: N/A;   CATARACT EXTRACTION Right 12/04/2018   CESAREAN SECTION     CHOLECYSTECTOMY     INSERTION OF ICD     a. s/p dual-chamber [employer] ICD 12/2014   LAPAROSCOPIC GASTRIC SLEEVE RESECTION     PACEMAKER INSERTION     TUBAL LIGATION     Family History  Problem Relation Age of Onset   Heart attack Father        a. Initial onset in his 12's. S/p CABG   Heart failure Father     Social History   Tobacco Use   Smoking status: Former    Packs/day: 1.00    Years: 20.00    Pack years: 20.00    Types: Cigarettes    Quit date: 2016    Years since quitting: 6.4  Smokeless tobacco: Never  Substance Use Topics   Alcohol use: No    Alcohol/week: 0.0 standard drinks   Marital Status: Single  ROS  Review of Systems  Constitutional: Positive for weight loss (intentional).  Cardiovascular:  Positive for dyspnea on exertion. Negative for chest pain, claudication, leg swelling, near-syncope, orthopnea, palpitations, paroxysmal nocturnal dyspnea and syncope.  Objective  Blood pressure 97/71, pulse (!) 115, temperature 98.6 F (37 C), resp. rate 16, height 5' 7"  (1.702 m), weight 234 lb (106.1 kg), last menstrual period 04/17/2016, SpO2 99 %.   Vitals with BMI 04/20/2021 04/19/2021 04/19/2021  Height 5' 7"  - -  Weight 234 lbs - -  BMI 96.72 - -  Systolic 97 85 897  Diastolic 71 65 65  Pulse 915 122 105    Orthostatic VS for the past 72 hrs (Last 3 readings):  Orthostatic BP Patient Position BP Location Cuff Size Orthostatic Pulse  04/20/21 0959 107/62 Standing Left Arm Large 123  04/20/21 0958 96/71 Sitting Left Arm Large 114  04/20/21 0957 105/74 Supine Left Arm Large 101    Physical Exam Vitals reviewed.  Constitutional:      General: She is not in acute distress.    Appearance: She is well-developed.     Comments: Morbidly obese in no acute distress.  Neck:     Thyroid: No thyromegaly.  Cardiovascular:     Rate and Rhythm: Regular rhythm. Tachycardia present.     Pulses: Intact distal pulses.          Carotid pulses are 2+ on the right side and 2+ on the left side.      Dorsalis pedis pulses are 2+ on the right side and 2+ on the left side.       Posterior tibial pulses are 2+ on the right side and 2+ on the left side.     Heart sounds: Normal heart sounds, S1 normal and S2 normal. No murmur heard.   No gallop.     Comments: No leg edema, no JVD.  No evidence of pulsus paradoxus on exam. Pulmonary:     Effort: Pulmonary effort is normal. No accessory muscle usage or respiratory distress.     Breath sounds: Normal breath sounds. No wheezing, rhonchi or rales.  Abdominal:     Comments: Obese. Pannus present  Musculoskeletal:     Right lower leg: No edema.     Left lower leg: No edema.  Neurological:     Mental Status: She is alert.   Laboratory examination:   External labs:  12/22/2020: Hb 12.1/HCT 36.8, platelets 227. Sodium 136, potassium 3.8, BUN 7, creatinine 0.69, EGFR >90 mL.  Serum glucose 106 mg.  08/31/2020: A1c 7.6%  07/20/2020: Glucose 101, sodium 139, potassium 4.2, BUN 12, creatinine 0.75, EGFR greater than 90 Hemoglobin 13.0, hematocrit 39.8, platelets 250  03/04/2020: BUN 14, creatinine  0.8, serum glucose 140 mg, EGFR >60 mL.  Magnesium 1.6. Total cholesterol 124, triglycerides 129, HDL 55, LDL 63.  Non-HDL cholesterol 69. Hb 13.1/HCT 39.3, platelets 207.  Allergies  No Known Allergies   Medications Prior to Visit:   Outpatient Medications Prior to Visit  Medication Sig Dispense Refill   folic acid (FOLVITE) 1 MG tablet Take 3 mg by mouth daily.      furosemide (LASIX) 40 MG tablet TAKE 2 TABLETS BY MOUTH DAILY AS NEEDED FOR SHORTNESS OF BREATH (Patient taking differently: Take by mouth daily as needed. 1 tablet Shortness of breath) 60 tablet 3  hydrOXYzine (VISTARIL) 25 MG capsule Take 25 mg by mouth as needed.     JARDIANCE 25 MG TABS tablet Take 25 mg by mouth daily.     LORazepam (ATIVAN) 0.5 MG tablet Take by mouth as needed.     potassium chloride (K-DUR) 10 MEQ tablet Take 10 mEq by mouth daily.     Semaglutide,0.25 or 0.5MG/DOS, (OZEMPIC, 0.25 OR 0.5 MG/DOSE,) 2 MG/1.5ML SOPN      Sertraline HCl 150 MG CAPS Take 150 mg by mouth daily.     Vitamin D, Ergocalciferol, (DRISDOL) 1.25 MG (50000 UT) CAPS capsule Take 50,000 Units by mouth once a week.     carvedilol (COREG) 25 MG tablet TAKE 1 TABLET (25 MG TOTAL) BY MOUTH 2 (TWO) TIMES DAILY WITH A MEAL. (Patient taking differently: Take 25 mg by mouth daily.) 180 tablet 2   ivabradine (CORLANOR) 7.5 MG TABS tablet Take 1 tablet (7.5 mg total) by mouth 2 (two) times daily with a meal. 60 tablet 3   sacubitril-valsartan (ENTRESTO) 24-26 MG Take 1 tablet by mouth 2 (two) times daily. 180 tablet 3   pantoprazole (PROTONIX) 40 MG tablet Take 1 tablet by mouth daily.     No facility-administered medications prior to visit.   Final Medications at End of Visit    Current Meds  Medication Sig   folic acid (FOLVITE) 1 MG tablet Take 3 mg by mouth daily.    furosemide (LASIX) 40 MG tablet TAKE 2 TABLETS BY MOUTH DAILY AS NEEDED FOR SHORTNESS OF BREATH (Patient taking differently: Take by mouth daily as needed. 1 tablet  Shortness of breath)   hydrOXYzine (VISTARIL) 25 MG capsule Take 25 mg by mouth as needed.   ivabradine (CORLANOR) 7.5 MG TABS tablet Take 1 tablet (7.5 mg total) by mouth 2 (two) times daily with a meal.   JARDIANCE 25 MG TABS tablet Take 25 mg by mouth daily.   LORazepam (ATIVAN) 0.5 MG tablet Take by mouth as needed.   metoprolol succinate (TOPROL-XL) 25 MG 24 hr tablet Take 1 tablet (25 mg total) by mouth daily. Take with or immediately following a meal.   potassium chloride (K-DUR) 10 MEQ tablet Take 10 mEq by mouth daily.   Semaglutide,0.25 or 0.5MG/DOS, (OZEMPIC, 0.25 OR 0.5 MG/DOSE,) 2 MG/1.5ML SOPN    Sertraline HCl 150 MG CAPS Take 150 mg by mouth daily.   Vitamin D, Ergocalciferol, (DRISDOL) 1.25 MG (50000 UT) CAPS capsule Take 50,000 Units by mouth once a week.   [DISCONTINUED] carvedilol (COREG) 25 MG tablet TAKE 1 TABLET (25 MG TOTAL) BY MOUTH 2 (TWO) TIMES DAILY WITH A MEAL. (Patient taking differently: Take 25 mg by mouth daily.)   [DISCONTINUED] ivabradine (CORLANOR) 7.5 MG TABS tablet Take 1 tablet (7.5 mg total) by mouth 2 (two) times daily with a meal.   [DISCONTINUED] sacubitril-valsartan (ENTRESTO) 24-26 MG Take 1 tablet by mouth 2 (two) times daily.   Radiology:   DG Chest 2 View 04/19/2021 Stable cardiomegaly. Increased density posterior to the heart border at the lung base may represent atrial enlargement or increasing pericardial effusion.   CT Angio Chest PE W and/or Wo Contrast 04/19/2021:   1. No pulmonary embolus.  2. Pericardial effusion has progressed from prior abdominal CT 2 months ago, now moderate to large in size measuring up to 2.8 cm posterior to the left heart.  3. Chronic mild mediastinal adenopathy is similar to 2019 exam and likely related to sarcoidosis.  4. Mild prominence of the main pulmonary  artery suggesting pulmonary arterial hypertension.   Cardiac Studies:   Nuclear stress test 02/01/2016: No significant ST segment changes or arrhythmias  were noted during stress  Negative ETT for ischemia at workload achieved  No significant chest pain symptoms reported  No significant arrhythmia noted  nuclear images report to follow   Right heart catheterization and myocardial biopsy 07/17/2016: Mild pulmonary hypertension, mildly elevated pulmonary capillary wedge pressure, biopsy negative for sarcoid.  Cardiac PET sarcoidosis with nuclear medicine perfusion 03/05/2020: A myocardial SPECT scan was performed at rest and contains a large, severe perfusion defect involving the anterior, lateral and anterior lateral walls of the left ventricle, necessitating a FDG-PET viability study.  Physiologic distribution of radiotracer. No substantial accumulation of radiotracer in the area of perfusion defect. Other PET findings: Few FDG avid bilateral axillary lymph nodes, index right axillary node measuring 1 cm (SUV max 3.5). Additionally, there are a few hypermetabolic superior mediastinal lymph nodes. Index right upper paratracheal lymph node shows SUV max 2.2. CT: Few small nodules are noted in right upper lobe, similar to 12/02/2019 CT exam. Subsegmental atelectasis/scarring in left lower lobe. Left chest wall cardiac rhythm maintenance device with distal leads terminating in right atrial appendage and right ventricle and coronary sinus. Small fat-containing of umbilical hernia.   PCV ECHOCARDIOGRAM COMPLETE 09/03/2020 Left ventricle cavity is normal in size. Moderate concentric hypertrophy of the left ventricle. Moderately depressed LV systolic function with EF 35%. Left ventricle regional wall motion findings: Basal inferolateral and Mid inferolateral akinesis.  Severe global hypokinesis. Calculated EF 35%. No significant valvular abnormality. RV normal in size and function. Pacemaker lead seen. Small pericardial effusion with clear fluids. Compared to 06/26/2017, EF decreased form 40-45%. Inferior akinesis new.   ICD   Scheduled Remote ICD  check 10/05/2020:  There were 2 atrial high rate episodes detected. The longest lasted 47 seconds in duration. No EGMs available for review. There was a <1 % cumulative atrial arrhythmia burden. No VHR episodes.  Health trends (patient activity, heart rate variability, average heart rates) are stable. The review of the implantable cardiac monitoring data remains stable. Battery longevity is 6 years . RA pacing is 1 %, RV pacing is 98 %, and LV pacing is 98 %.   EKG   10/12/2020: AV paced rhythm at rate of 84 bpm.  Biventricular pacing detected.  Wide QRS complex.  No further analysis.    03/12/2020: Underlying sinus rhythm normal sinus rhythm at the rate of 83 bpm, ventricularly paced rhythm.  No further analysis.  Biventricular pacemaker detected.    09/15/2019: V paced rhythm, no further analysis.   Assessment     ICD-10-CM   1. Chronic systolic CHF (congestive heart failure) (HCC)  I50.22 EKG 12-Lead    PCV ECHOCARDIOGRAM LIMITED    2. Nonischemic cardiomyopathy (HCC)  I42.8     3. Pericardial effusion  I31.3 PCV ECHOCARDIOGRAM LIMITED       Meds ordered this encounter  Medications   metoprolol succinate (TOPROL-XL) 25 MG 24 hr tablet    Sig: Take 1 tablet (25 mg total) by mouth daily. Take with or immediately following a meal.    Dispense:  90 tablet    Refill:  3   ivabradine (CORLANOR) 7.5 MG TABS tablet    Sig: Take 1 tablet (7.5 mg total) by mouth 2 (two) times daily with a meal.    Dispense:  60 tablet    Refill:  3     Medications Discontinued During This Encounter  Medication Reason   carvedilol (COREG) 25 MG tablet Discontinued by provider   ivabradine (CORLANOR) 7.5 MG TABS tablet Discontinued by provider   sacubitril-valsartan (ENTRESTO) 24-26 MG Discontinued by provider   carvedilol (COREG) 25 MG tablet Discontinued by provider     Recommendations:   Katonya Blecher  is a 55 y.o. female  with history of systemic sarcoidosis on steroid sparing  immunosuppressive agents and chronic nonischemic cardiomyopathy (normal coronary arteries in 2003 & 2017 at [facility]) and severe  LV systolic dysfunction. Cardiac MRI performed in 2011 and cardiac PET scan in August 2017 does not appear to be consistent with cardiac sarcoidosis.  She had repeat PET scan performed at [facility] on 03/05/2020 again confirming this.  She also has uncontrolled diabetes, morbid obesity, mild  sleep apnea  but not tolerating CPAP, systemic  hypertension.  Patient underwent laparoscopic gastric sleeve on 12/20/2020 and so far has lost about 55 pounds in weight.    Patient presented to [facility] emergency department 04/19/2021 with concerns of pleuritic chest pain.  CTA of the chest revealed moderate to large pericardial effusion, however patient eloped from emergency department before further evaluation and recommendations could be performed.  She now presents to our office for urgent follow-up.  Patient is not orthostatic in the office today and there is no evidence of pulses paradoxus on physical exam.  Suspect CT overcall patient's pericardial effusion.  We will therefore order urgent echocardiogram to further evaluate.  Advised patient to take Lasix 40 mg daily for the next 1 week.  In regard to cardiomyopathy and systolic heart failure Will discontinue carvedilol and switch her to metoprolol succinate 25 mg daily in order to improve heart rate control.  Her blood pressure is soft.  We will therefore discontinue Entresto at this time.  Patient is presently on Corlanor, however her heart rate remains >70 bpm.  Could consider discontinuation of Corlanor in the future if patient's heart rate control does not improve.   Suspect patient's tachycardia is related to significant weight loss, reassured patient and advised her that symptoms should improve as she continues to lose weight and get to her new baseline.  There is no clinical evidence of acute decompensated heart failure  today.  Will plan to follow closely in 2 weeks for pericardial effusion and heart failure.  Patient was seen in collaboration with Dr. Einar Gip. He also reviewed patient's chart and examined the patient. Dr. Einar Gip is in agreement of the plan.    Alethia Berthold, PA-C 04/20/2021, 1:07 PM Office: [phone]

## 2021-04-29 ENCOUNTER — Other Ambulatory Visit: Payer: Self-pay

## 2021-04-29 ENCOUNTER — Ambulatory Visit: Payer: Medicaid Other

## 2021-04-29 DIAGNOSIS — I5022 Chronic systolic (congestive) heart failure: Secondary | ICD-10-CM

## 2021-04-29 DIAGNOSIS — I3139 Other pericardial effusion (noninflammatory): Secondary | ICD-10-CM

## 2021-05-05 NOTE — Progress Notes (Signed)
Primary Physician/Referring:  Henderson Baltimore, FNP  Patient ID: Alicia Cook, female    DOB: 1966-08-21, 55 y.o.   MRN: [id number]  Chief Complaint  Patient presents with   Chronic systolic CHF    Follow-up    HPI:    Alicia Cook  is a 55 y.o. 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 Cardiac MRI performed in 2011 and cardiac PET scan in August 2017 does not appear to be consistent with cardiac sarcoidosis.  She had repeat PET scan performed at [facility] on 03/05/2020 again confirming this.  She also has uncontrolled diabetes, morbid obesity, mild  sleep apnea  but not tolerating CPAP, systemic  hypertension.  Patient underwent laparoscopic gastric sleeve on 12/20/2020 and so far has lost about 55 pounds in weight.    Patient has been evaluated in [facility] emergency department 04/19/2021, during this evaluation CT of the chest revealed moderate pericardial effusion however she eloped from the emergency department prior to further evaluation and management.  She then followed up in our office 04/20/2021 at which time advised patient to take Lasix 40 mg daily for 1 week and ordered stat echocardiogram.  Also during last visit transitioned patient from carvedilol to metoprolol to improve heart rate control and discontinued Entresto given soft blood pressure.  Repeat echocardiogram revealed pericardial effusion increased from mild to moderate since November 2021.  Patient is feeling much better than she did last visit with improvement of fatigue since discontinuing Entresto and switching her to metoprolol.  Blood pressure is improved as well as heart rate control.  She states chest pain and dyspnea have resolved. She denies syncope, near syncope, dizziness, orthopnea, PND, leg edema.   Past Medical History:   Diagnosis Date   Cataract    CHF (congestive heart failure) ([place])    Depression    Diabetes mellitus without complication ([place])    Encounter for adjustment of biventricular implantable cardioverter-defibrillator (ICD) 06/13/2019   Hypertension    ICD: Cardiac defibrillator in situ 01/13/2015   [employer] Bi-V ICD (Dynagen X4 CRT-D) 01/13/2015 at [facility] - MRI Compatible  Scheduled Remote ICD check 4.22.20: 1 SVT/8 secs, normal function. Battery longevity is 8 years. RA pacing is 1 %, RV pacing is 99 %, and LV pacing is 99 %.   Nonischemic cardiomyopathy ([place])    a. EF 35-40% by cath in 2011 with normal cors b. s/p ICD implantation in 12/2014 c. EF 25% by NST in 02/01/2016   Sarcoidosis    Sarcoidosis 02/08/2016   Past Surgical History:  Procedure Laterality Date   ABDOMINAL SURGERY  12/20/2020    laparoscopic sleeve gastrectomy   CARDIAC CATHETERIZATION N/A 02/08/2016   Procedure: Left Heart Cath and Coronary Angiography;  Surgeon: Jettie Booze, MD;  Location: [place] [facility];  Service: Cardiovascular;  Laterality: N/A;   CATARACT EXTRACTION Right 12/04/2018   CESAREAN SECTION     CHOLECYSTECTOMY     INSERTION OF ICD     a. s/p dual-chamber [employer] ICD 12/2014   LAPAROSCOPIC GASTRIC SLEEVE RESECTION     PACEMAKER INSERTION     TUBAL LIGATION     Family History  Problem Relation Age of Onset   Heart attack Father        a. Initial onset in his 44's. S/p CABG   Heart failure Father  Social History   Tobacco Use   Smoking status: Former    Packs/day: 1.00    Years: 20.00    Pack years: 20.00    Types: Cigarettes    Quit date: 2016    Years since quitting: 6.5   Smokeless tobacco: Never  Substance Use Topics   Alcohol use: No    Alcohol/week: 0.0 standard drinks   Marital Status: Single  ROS  Review of Systems  Constitutional: Positive for weight loss (intentional).  Cardiovascular:  Negative for chest pain, claudication, dyspnea  on exertion (improved), leg swelling, near-syncope, orthopnea, palpitations, paroxysmal nocturnal dyspnea and syncope.  Objective  Blood pressure 124/83, pulse 92, temperature (!) 97.2 F (36.2 C), temperature source Temporal, resp. rate 16, height _0  (1.702 m), weight 233 lb (105.7 kg), last menstrual period 04/17/2016, SpO2 98 %.  Vitals with BMI 05/06/2021 04/20/2021 04/19/2021  Height _1  _2  -  Weight 233 lbs 234 lbs -  BMI 10.21 11.73 -  Systolic 567 97 85  Diastolic 83 71 65  Pulse 92 115 122     Physical Exam Vitals reviewed.  Constitutional:      General: She is not in acute distress.    Appearance: She is well-developed.     Comments: Morbidly obese in no acute distress.  Neck:     Thyroid: No thyromegaly.  Cardiovascular:     Rate and Rhythm: Normal rate and regular rhythm.     Pulses: Intact distal pulses.          Carotid pulses are 2+ on the right side and 2+ on the left side.      Dorsalis pedis pulses are 2+ on the right side and 2+ on the left side.       Posterior tibial pulses are 2+ on the right side and 2+ on the left side.     Heart sounds: Normal heart sounds, S1 normal and S2 normal. No murmur heard.   No gallop.     Comments: No leg edema, no JVD.  No evidence of pulsus paradoxus on exam. Pulmonary:     Effort: Pulmonary effort is normal. No accessory muscle usage.     Breath sounds: Normal breath sounds. No wheezing or rales.  Abdominal:     Comments: Obese. Pannus present  Musculoskeletal:     Right lower leg: No edema.     Left lower leg: No edema.  Neurological:     Mental Status: She is alert.   Laboratory examination:   External labs:  12/22/2020: Hb 12.1/HCT 36.8, platelets 227. Sodium 136, potassium 3.8, BUN 7, creatinine 0.69, EGFR >90 mL.  Serum glucose 106 mg.  08/31/2020: A1c 7.6%  07/20/2020: Glucose 101, sodium 139, potassium 4.2, BUN 12, creatinine 0.75, EGFR greater than 90 Hemoglobin 13.0, hematocrit 39.8, platelets  250  03/04/2020: BUN 14, creatinine 0.8, serum glucose 140 mg, EGFR >60 mL.  Magnesium 1.6. Total cholesterol 124, triglycerides 129, HDL 55, LDL 63.  Non-HDL cholesterol 69. Hb 13.1/HCT 39.3, platelets 207.  Allergies  No Known Allergies   Medications Prior to Visit:   Outpatient Medications Prior to Visit  Medication Sig Dispense Refill   folic acid (FOLVITE) 1 MG tablet Take 3 mg by mouth daily.      furosemide (LASIX) 40 MG tablet TAKE 2 TABLETS BY MOUTH DAILY AS NEEDED FOR SHORTNESS OF BREATH (Patient taking differently: Take by mouth daily as needed. 1 tablet Shortness of breath) 60 tablet 3   hydrOXYzine (VISTARIL)  25 MG capsule Take 25 mg by mouth as needed.     ivabradine (CORLANOR) 7.5 MG TABS tablet Take 1 tablet (7.5 mg total) by mouth 2 (two) times daily with a meal. 60 tablet 3   JARDIANCE 25 MG TABS tablet Take 25 mg by mouth daily.     LORazepam (ATIVAN) 0.5 MG tablet Take by mouth as needed.     metoprolol succinate (TOPROL-XL) 25 MG 24 hr tablet Take 1 tablet (25 mg total) by mouth daily. Take with or immediately following a meal. 90 tablet 3   pantoprazole (PROTONIX) 40 MG tablet Take 1 tablet by mouth daily.     potassium chloride (K-DUR) 10 MEQ tablet Take 10 mEq by mouth daily.     Semaglutide,0.25 or 0.5MG/DOS, (OZEMPIC, 0.25 OR 0.5 MG/DOSE,) 2 MG/1.5ML SOPN      Sertraline HCl 150 MG CAPS Take 150 mg by mouth daily.     Vitamin D, Ergocalciferol, (DRISDOL) 1.25 MG (50000 UT) CAPS capsule Take 50,000 Units by mouth once a week.     No facility-administered medications prior to visit.   Final Medications at End of Visit    Current Meds  Medication Sig   folic acid (FOLVITE) 1 MG tablet Take 3 mg by mouth daily.    furosemide (LASIX) 40 MG tablet TAKE 2 TABLETS BY MOUTH DAILY AS NEEDED FOR SHORTNESS OF BREATH (Patient taking differently: Take by mouth daily as needed. 1 tablet Shortness of breath)   hydrOXYzine (VISTARIL) 25 MG capsule Take 25 mg by mouth as  needed.   ivabradine (CORLANOR) 7.5 MG TABS tablet Take 1 tablet (7.5 mg total) by mouth 2 (two) times daily with a meal.   JARDIANCE 25 MG TABS tablet Take 25 mg by mouth daily.   LORazepam (ATIVAN) 0.5 MG tablet Take by mouth as needed.   metoprolol succinate (TOPROL-XL) 25 MG 24 hr tablet Take 1 tablet (25 mg total) by mouth daily. Take with or immediately following a meal.   pantoprazole (PROTONIX) 40 MG tablet Take 1 tablet by mouth daily.   potassium chloride (K-DUR) 10 MEQ tablet Take 10 mEq by mouth daily.   Semaglutide,0.25 or 0.5MG/DOS, (OZEMPIC, 0.25 OR 0.5 MG/DOSE,) 2 MG/1.5ML SOPN    Sertraline HCl 150 MG CAPS Take 150 mg by mouth daily.   Vitamin D, Ergocalciferol, (DRISDOL) 1.25 MG (50000 UT) CAPS capsule Take 50,000 Units by mouth once a week.   Radiology:   DG Chest 2 View 04/19/2021 Stable cardiomegaly. Increased density posterior to the heart border at the lung base may represent atrial enlargement or increasing pericardial effusion.   CT Angio Chest PE W and/or Wo Contrast 04/19/2021:   1. No pulmonary embolus.  2. Pericardial effusion has progressed from prior abdominal CT 2 months ago, now moderate to large in size measuring up to 2.8 cm posterior to the left heart.  3. Chronic mild mediastinal adenopathy is similar to 2019 exam and likely related to sarcoidosis.  4. Mild prominence of the main pulmonary artery suggesting pulmonary arterial hypertension.   Cardiac Studies:   Nuclear stress test 02/01/2016: No significant ST segment changes or arrhythmias were noted during stress  Negative ETT for ischemia at workload achieved  No significant chest pain symptoms reported  No significant arrhythmia noted  nuclear images report to follow   Right heart catheterization and myocardial biopsy 07/17/2016: Mild pulmonary hypertension, mildly elevated pulmonary capillary wedge pressure, biopsy negative for sarcoid.  Cardiac PET sarcoidosis with nuclear medicine perfusion  03/05/2020:  A myocardial SPECT scan was performed at rest and contains a large, severe perfusion defect involving the anterior, lateral and anterior lateral walls of the left ventricle, necessitating a FDG-PET viability study.  Physiologic distribution of radiotracer. No substantial accumulation of radiotracer in the area of perfusion defect. Other PET findings: Few FDG avid bilateral axillary lymph nodes, index right axillary node measuring 1 cm (SUV max 3.5). Additionally, there are a few hypermetabolic superior mediastinal lymph nodes. Index right upper paratracheal lymph node shows SUV max 2.2. CT: Few small nodules are noted in right upper lobe, similar to 12/02/2019 CT exam. Subsegmental atelectasis/scarring in left lower lobe. Left chest wall cardiac rhythm maintenance device with distal leads terminating in right atrial appendage and right ventricle and coronary sinus. Small fat-containing of umbilical hernia.   Echocardiogram 04/29/2021:  Left ventricle cavity is normal in size. Moderate asymmetric hypertrophy  of the left ventricle, with posterior wall measuring 1.5 cm.Moderate  global hypokinesis with basal inferolateral akinesis. Moderately depressed  LV systolic function with EF 35-40% Doppler evidence of grade II  (pseudonormal) diastolic dysfunction, elevated LAP.  Left atrial cavity is mildly dilated at 4 cm.  Tethering of posterior leaflet with moderate (grade III), posteriorly  directed mitral regurgitation.  Moderate loculated pericardial effusion with clear fluids. There is no apparent hemodynamic significance.  Compared to previous study on 09/03/2020, pericardial effusion increased  from mild. Mitral regurgitation is new.    ICD   Scheduled Remote ICD check 10/05/2020:  There were 2 atrial high rate episodes detected. The longest lasted 47 seconds in duration. No EGMs available for review. There was a <1 % cumulative atrial arrhythmia burden. No VHR episodes.  Health trends  (patient activity, heart rate variability, average heart rates) are stable. The review of the implantable cardiac monitoring data remains stable. Battery longevity is 6 years . RA pacing is 1 %, RV pacing is 98 %, and LV pacing is 98 %.  EKG   10/12/2020: AV paced rhythm at rate of 84 bpm.  Biventricular pacing detected.  Wide QRS complex.  No further analysis.    03/12/2020: Underlying sinus rhythm normal sinus rhythm at the rate of 83 bpm, ventricularly paced rhythm.  No further analysis.  Biventricular pacemaker detected.    09/15/2019: V paced rhythm, no further analysis.   Assessment     ICD-10-CM   1. Chronic systolic CHF (congestive heart failure) (HCC)  I50.22 PCV ECHOCARDIOGRAM LIMITED    2. Nonischemic cardiomyopathy (HCC)  I42.8 PCV ECHOCARDIOGRAM LIMITED    3. Pericardial effusion  I31.3 PCV ECHOCARDIOGRAM LIMITED    4. Nonrheumatic mitral valve regurgitation  I34.0        No orders of the defined types were placed in this encounter.    There are no discontinued medications.    Recommendations:   Alicia Cook  is a 55 y.o. 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 Cardiac MRI performed in 2011 and cardiac PET scan in August 2017 does not appear to be consistent with cardiac sarcoidosis.  She had repeat PET scan performed at [facility] on 03/05/2020 again confirming this.  She also has uncontrolled diabetes, morbid obesity, mild  sleep apnea  but not tolerating CPAP, systemic  hypertension.  Patient underwent laparoscopic gastric sleeve on 12/20/2020 and so far has lost about 55 pounds in weight.    Patient has  been evaluated in [facility] emergency department 04/19/2021, during this evaluation CT of the chest revealed moderate pericardial effusion however she eloped from the emergency  department prior to further evaluation and management.  She then followed up in our office 04/20/2021 at which time advised patient to take Lasix 40 mg daily for 1 week and ordered stat echocardiogram.  Also during last visit transitioned patient from carvedilol to metoprolol to improve heart rate control and discontinued Entresto given soft blood pressure.  Patient's dyspnea and chest pain have improved since last visit.  Reviewed with patient regarding echocardiogram results, details above. In regard to mitral regurgitation will monitor with repeat echocardiogram repeat in 3 months as she is without significant symptoms.  Pericardial effusion has increased from mild to moderate compared to November 2022, however is not hemodynamically significant and patient is relatively asymptomatic.  We will therefore repeat echocardiogram in 3 months unless she develops symptoms.  Counseled patient regarding signs and symptoms that would warrant urgent or emergent evaluation, she verbalized understanding agreement.  Patient's blood pressure and heart rate have both improved since last visit, will therefore continue metoprolol and Corlanor.  Again suspect patient's elevated heart rate is related to significant weight loss, reassured patient and advised her that symptoms should improve as she continues to lose weight and get to her new baseline.  There is no clinical evidence of acute decompensated heart failure today.  Follow up in 3 months, sooner if needed, for cardiomyopathy, MR, pericardial effusion.   Patient was seen in collaboration with Dr. Einar Gip and he is in agreement with the plan.      Alethia Berthold, PA-C 05/20/2021, 4:00 PM Office: [phone]

## 2021-05-06 ENCOUNTER — Ambulatory Visit: Payer: Medicaid Other | Admitting: Student

## 2021-05-06 ENCOUNTER — Encounter: Payer: Self-pay | Admitting: Student

## 2021-05-06 ENCOUNTER — Other Ambulatory Visit: Payer: Self-pay

## 2021-05-06 VITALS — BP 124/83 | HR 92 | Temp 97.2°F | Resp 16 | Ht 67.0 in | Wt 233.0 lb

## 2021-05-06 DIAGNOSIS — I340 Nonrheumatic mitral (valve) insufficiency: Secondary | ICD-10-CM

## 2021-05-06 DIAGNOSIS — I3139 Other pericardial effusion (noninflammatory): Secondary | ICD-10-CM

## 2021-05-06 DIAGNOSIS — I428 Other cardiomyopathies: Secondary | ICD-10-CM

## 2021-05-06 DIAGNOSIS — I5022 Chronic systolic (congestive) heart failure: Secondary | ICD-10-CM

## 2021-05-06 DIAGNOSIS — I313 Pericardial effusion (noninflammatory): Secondary | ICD-10-CM

## 2021-05-17 ENCOUNTER — Telehealth: Payer: Self-pay

## 2021-05-17 NOTE — Telephone Encounter (Signed)
Agree. Continue for now

## 2021-05-17 NOTE — Telephone Encounter (Signed)
Pt called asking how much longer is she supposed to take the furosemide. I asked her if she was still having SOB which she is supposed to take fluid pill prn SOB and she said that she was and it seemed to help. I suggested to continue as long as SOB presists but she still wanted to run it by Dr. Please review and advise

## 2021-05-17 NOTE — Telephone Encounter (Signed)
From patient.

## 2021-05-19 NOTE — Telephone Encounter (Signed)
Patient called back, I informed her to continue Lasix if the SOB persist. Patient voiced understanding.

## 2021-05-19 NOTE — Telephone Encounter (Signed)
Called patient, NA, LMAM

## 2021-06-08 ENCOUNTER — Telehealth: Payer: Self-pay | Admitting: Student

## 2021-06-08 NOTE — Telephone Encounter (Signed)
Called and spoke to pt. Pt will call if symptoms do not get better or worsen.

## 2021-06-08 NOTE — Telephone Encounter (Signed)
Pt called stating difficulty breathing, wanting to discuss w/med assistant.

## 2021-06-08 NOTE — Telephone Encounter (Signed)
Called pt, pt stated symptoms started last Friday. Pt tested negative for covid twice since then. Pt spoke to her pulmonologist and they started her on prednisone and it has made it even worse. She used the fluid pill doesn't seem like its working. She is not releasing any fluid. She is not sure if this is from her lungs or her heart. Pt stated she throws up when she is asleep.

## 2021-06-08 NOTE — Telephone Encounter (Signed)
Called pt, no answer. Left vm requesting call back?

## 2021-06-08 NOTE — Telephone Encounter (Signed)
If nebulizer does not resolved symptoms  I would like to see her Friday.

## 2021-06-08 NOTE — Telephone Encounter (Signed)
Pt called her PCP and spoke to them regarding this. They are going to have her start using a nebulizer. She is unable to come in tomorrow. Do you want to see her Friday still?

## 2021-06-08 NOTE — Telephone Encounter (Signed)
I called patient and left a voicemail to gather more information. Please set her up for appt to see me tomorrow.

## 2021-06-10 ENCOUNTER — Ambulatory Visit: Payer: Medicaid Other | Admitting: Student

## 2021-06-10 ENCOUNTER — Encounter: Payer: Self-pay | Admitting: Student

## 2021-06-10 ENCOUNTER — Other Ambulatory Visit: Payer: Self-pay

## 2021-06-10 ENCOUNTER — Telehealth: Payer: Self-pay

## 2021-06-10 VITALS — BP 127/85 | HR 102 | Temp 97.6°F | Resp 16 | Ht 67.0 in | Wt 228.6 lb

## 2021-06-10 DIAGNOSIS — I3139 Other pericardial effusion (noninflammatory): Secondary | ICD-10-CM

## 2021-06-10 DIAGNOSIS — R000 Tachycardia, unspecified: Secondary | ICD-10-CM

## 2021-06-10 DIAGNOSIS — I428 Other cardiomyopathies: Secondary | ICD-10-CM

## 2021-06-10 DIAGNOSIS — I313 Pericardial effusion (noninflammatory): Secondary | ICD-10-CM

## 2021-06-10 DIAGNOSIS — I5022 Chronic systolic (congestive) heart failure: Secondary | ICD-10-CM

## 2021-06-10 MED ORDER — TORSEMIDE 20 MG PO TABS: 20.0000 mg | ORAL_TABLET | Freq: Two times a day (BID) | ORAL | 3 refills | Status: DC

## 2021-06-10 MED ORDER — ENTRESTO 24-26 MG PO TABS: 1.0000 | ORAL_TABLET | Freq: Two times a day (BID) | ORAL | 3 refills | Status: DC

## 2021-06-10 NOTE — Telephone Encounter (Signed)
Patient coming for OV

## 2021-06-10 NOTE — Telephone Encounter (Signed)
Patient called and said that she has fluid around her lungs and was ordered a lasix IV by pcp, she also wanted to know about her echo that is being done at the hospital not until end of august.

## 2021-06-10 NOTE — Progress Notes (Signed)
Primary Physician/Referring:  [name], MD  Patient ID: Alicia Cook, female    DOB: 15-Dec-1965, 55 y.o.   MRN: [id number]  Chief Complaint  Patient presents with   Congestive Heart Failure   Follow-up   Shortness of Breath    HPI:    Alicia Cook  is a 54 y.o. 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 Cardiac MRI performed in 2011 and cardiac PET scan in August 2017 does not appear to be consistent with cardiac sarcoidosis.  She had repeat PET scan performed at [facility] on 03/05/2020 again confirming this.  She also has uncontrolled diabetes, morbid obesity, mild  sleep apnea  but not tolerating CPAP, systemic  hypertension.  Patient underwent laparoscopic gastric sleeve on 12/20/2020 and so far has lost about 60 pounds in weight.    Patient presents for urgent visit with complaints of orthopnea over the last 1 week.  States PCP evaluated patient and obtained chest x-ray which revealed small bilateral pleural effusions.  PCP therefore brought patient into the office for single injection of Lasix, which has not improved patient's symptoms.  Patient has only been taking Lasix intermittently over the last 1 week.  Denies chest pain, dizziness, palpitations, syncope, near syncope.  Denies leg swelling, PND.  She is having dyspnea on exertion and mild fatigue as well.  Previously had transitioned patient from carvedilol to metoprolol to improve heart rate and discontinued Entresto given soft blood pressure.  Past Medical History:  Diagnosis Date   Cataract    CHF (congestive heart failure) ([place])    Depression    Diabetes mellitus without complication ([place])    Encounter for adjustment of biventricular implantable cardioverter-defibrillator (ICD) 06/13/2019   Hypertension    ICD: Cardiac  defibrillator in situ 01/13/2015   [employer] Bi-V ICD (Dynagen X4 CRT-D) 01/13/2015 at [facility] - MRI Compatible  Scheduled Remote ICD check 4.22.20: 1 SVT/8 secs, normal function. Battery longevity is 8 years. RA pacing is 1 %, RV pacing is 99 %, and LV pacing is 99 %.   Nonischemic cardiomyopathy ([place])    a. EF 35-40% by cath in 2011 with normal cors b. s/p ICD implantation in 12/2014 c. EF 25% by NST in 02/01/2016   Sarcoidosis    Sarcoidosis 02/08/2016   Past Surgical History:  Procedure Laterality Date   ABDOMINAL SURGERY  12/20/2020    laparoscopic sleeve gastrectomy   CARDIAC CATHETERIZATION N/A 02/08/2016   Procedure: Left Heart Cath and Coronary Angiography;  Surgeon: Jettie Booze, MD;  Location: [place] [facility];  Service: Cardiovascular;  Laterality: N/A;   CATARACT EXTRACTION Right 12/04/2018   CESAREAN SECTION     CHOLECYSTECTOMY     INSERTION OF ICD     a. s/p dual-chamber [employer] ICD 12/2014   LAPAROSCOPIC GASTRIC SLEEVE RESECTION     PACEMAKER INSERTION     TUBAL LIGATION     Family History  Problem Relation Age of Onset   Heart attack Father        a. Initial onset in his 45's. S/p CABG   Heart failure Father     Social History   Tobacco Use   Smoking status: Former    Packs/day: 1.00    Years: 20.00    Pack years: 20.00    Types: Cigarettes    Quit date: 2016  Years since quitting: 6.6   Smokeless tobacco: Never  Substance Use Topics   Alcohol use: No    Alcohol/week: 0.0 standard drinks   Marital Status: Single  ROS  Review of Systems  Constitutional: Positive for weight loss (intentional). Negative for malaise/fatigue.  Cardiovascular:  Positive for dyspnea on exertion and orthopnea. Negative for chest pain, claudication, leg swelling, near-syncope, palpitations, paroxysmal nocturnal dyspnea and syncope.  Neurological:  Negative for dizziness.  Objective  Blood pressure 127/85, pulse (!) 102, temperature 97.6 F  (36.4 C), temperature source Temporal, resp. rate 16, height 5' 7"  (1.702 m), weight 228 lb 9.6 oz (103.7 kg), last menstrual period 04/17/2016.  Vitals with BMI 06/10/2021 05/06/2021 04/20/2021  Height 5' 7"  5' 7"  5' 7"   Weight 228 lbs 10 oz 233 lbs 234 lbs  BMI 35.8 38.46 65.99  Systolic 357 017 97  Diastolic 85 83 71  Pulse 793 92 115     Physical Exam Vitals reviewed.  Constitutional:      General: She is not in acute distress.    Appearance: She is well-developed.     Comments: Morbidly obese in no acute distress.  Neck:     Thyroid: No thyromegaly.     Vascular: No carotid bruit or JVD.  Cardiovascular:     Rate and Rhythm: Normal rate and regular rhythm.     Pulses: Intact distal pulses.          Carotid pulses are 2+ on the right side and 2+ on the left side.      Dorsalis pedis pulses are 2+ on the right side and 2+ on the left side.       Posterior tibial pulses are 2+ on the right side and 2+ on the left side.     Heart sounds: Normal heart sounds, S1 normal and S2 normal. No murmur heard.   No gallop.     Comments: No evidence of pulsus paradoxus on exam. Pulmonary:     Effort: Pulmonary effort is normal. No accessory muscle usage.     Breath sounds: Normal breath sounds. No wheezing or rales.  Abdominal:     Comments: Obese. Pannus present  Musculoskeletal:     Right lower leg: No edema.     Left lower leg: No edema.  Skin:    General: Skin is warm and dry.   Laboratory examination:   CMP Latest Ref Rng & Units 04/19/2021 02/08/2021 02/24/2018  Glucose 70 - 99 mg/dL 100(H) 90 111(H)  BUN 6 - 20 mg/dL 15 22(H) 10  Creatinine 0.44 - 1.00 mg/dL 0.83 0.75 0.59  Sodium 135 - 145 mmol/L 135 138 138  Potassium 3.5 - 5.1 mmol/L 3.9 3.9 4.1  Chloride 98 - 111 mmol/L 102 106 107  CO2 22 - 32 mmol/L 26 25 24   Calcium 8.9 - 10.3 mg/dL 9.4 9.2 9.3  Total Protein 6.5 - 8.1 g/dL 7.6 7.3 -  Total Bilirubin 0.3 - 1.2 mg/dL 0.3 0.2(L) -  Alkaline Phos 38 - 126 U/L 93 73 -   AST 15 - 41 U/L 55(H) 37 -  ALT 0 - 44 U/L 65(H) 56(H) -   CBC Latest Ref Rng & Units 04/19/2021 02/08/2021 01/06/2018  WBC 4.0 - 10.5 K/uL 9.5 9.2 10.4  Hemoglobin 12.0 - 15.0 g/dL 14.0 13.1 12.6  Hematocrit 36.0 - 46.0 % 43.6 41.8 39.8  Platelets 150 - 400 K/uL 182 202 269   Lipid Panel     Component Value Date/Time  CHOL 165 02/09/2016 0700   TRIG 94 02/09/2016 0700   HDL 64 02/09/2016 0700   CHOLHDL 2.6 02/09/2016 0700   VLDL 19 02/09/2016 0700   LDLCALC 82 02/09/2016 0700   HEMOGLOBIN A1C No results found for: HGBA1C, MPG TSH No results for input(s): TSH in the last 8760 hours.  External labs:  12/22/2020: Hb 12.1/HCT 36.8, platelets 227. Sodium 136, potassium 3.8, BUN 7, creatinine 0.69, EGFR >90 mL.  Serum glucose 106 mg.  08/31/2020: A1c 7.6%  07/20/2020: Glucose 101, sodium 139, potassium 4.2, BUN 12, creatinine 0.75, EGFR greater than 90 Hemoglobin 13.0, hematocrit 39.8, platelets 250  03/04/2020: BUN 14, creatinine 0.8, serum glucose 140 mg, EGFR >60 mL.  Magnesium 1.6. Total cholesterol 124, triglycerides 129, HDL 55, LDL 63.  Non-HDL cholesterol 69. Hb 13.1/HCT 39.3, platelets 207.  Allergies  No Known Allergies   Medications Prior to Visit:   Outpatient Medications Prior to Visit  Medication Sig Dispense Refill   folic acid (FOLVITE) 1 MG tablet Take 3 mg by mouth daily.      hydrOXYzine (VISTARIL) 25 MG capsule Take 25 mg by mouth as needed.     ivabradine (CORLANOR) 7.5 MG TABS tablet Take 1 tablet (7.5 mg total) by mouth 2 (two) times daily with a meal. 60 tablet 3   JARDIANCE 25 MG TABS tablet Take 25 mg by mouth daily.     LORazepam (ATIVAN) 0.5 MG tablet Take by mouth as needed.     metoprolol succinate (TOPROL-XL) 25 MG 24 hr tablet Take 1 tablet (25 mg total) by mouth daily. Take with or immediately following a meal. 90 tablet 3   potassium chloride (K-DUR) 10 MEQ tablet Take 10 mEq by mouth daily.     Semaglutide,0.25 or 0.5MG/DOS, (OZEMPIC,  0.25 OR 0.5 MG/DOSE,) 2 MG/1.5ML SOPN      Sertraline HCl 150 MG CAPS Take 150 mg by mouth daily.     Vitamin D, Ergocalciferol, (DRISDOL) 1.25 MG (50000 UT) CAPS capsule Take 50,000 Units by mouth once a week.     furosemide (LASIX) 40 MG tablet TAKE 2 TABLETS BY MOUTH DAILY AS NEEDED FOR SHORTNESS OF BREATH (Patient taking differently: Take by mouth daily as needed. 1 tablet Shortness of breath) 60 tablet 3   KLOR-CON M10 10 MEQ tablet Take 20 mEq by mouth daily.     pantoprazole (PROTONIX) 40 MG tablet Take 1 tablet by mouth daily. (Patient not taking: Reported on 06/10/2021)     sertraline (ZOLOFT) 100 MG tablet Take 100 mg by mouth 2 (two) times daily.     No facility-administered medications prior to visit.   Final Medications at End of Visit    Current Meds  Medication Sig   folic acid (FOLVITE) 1 MG tablet Take 3 mg by mouth daily.    hydrOXYzine (VISTARIL) 25 MG capsule Take 25 mg by mouth as needed.   ivabradine (CORLANOR) 7.5 MG TABS tablet Take 1 tablet (7.5 mg total) by mouth 2 (two) times daily with a meal.   JARDIANCE 25 MG TABS tablet Take 25 mg by mouth daily.   LORazepam (ATIVAN) 0.5 MG tablet Take by mouth as needed.   metoprolol succinate (TOPROL-XL) 25 MG 24 hr tablet Take 1 tablet (25 mg total) by mouth daily. Take with or immediately following a meal.   potassium chloride (K-DUR) 10 MEQ tablet Take 10 mEq by mouth daily.   sacubitril-valsartan (ENTRESTO) 24-26 MG Take 1 tablet by mouth 2 (two) times daily.  Semaglutide,0.25 or 0.5MG/DOS, (OZEMPIC, 0.25 OR 0.5 MG/DOSE,) 2 MG/1.5ML SOPN    Sertraline HCl 150 MG CAPS Take 150 mg by mouth daily.   torsemide (DEMADEX) 20 MG tablet Take 1 tablet (20 mg total) by mouth 2 (two) times daily.   Vitamin D, Ergocalciferol, (DRISDOL) 1.25 MG (50000 UT) CAPS capsule Take 50,000 Units by mouth once a week.   [DISCONTINUED] furosemide (LASIX) 40 MG tablet TAKE 2 TABLETS BY MOUTH DAILY AS NEEDED FOR SHORTNESS OF BREATH (Patient taking  differently: Take by mouth daily as needed. 1 tablet Shortness of breath)   Radiology:   DG Chest 2 View 04/19/2021 Stable cardiomegaly. Increased density posterior to the heart border at the lung base may represent atrial enlargement or increasing pericardial effusion.   CT Angio Chest PE W and/or Wo Contrast 04/19/2021:   1. No pulmonary embolus.  2. Pericardial effusion has progressed from prior abdominal CT 2 months ago, now moderate to large in size measuring up to 2.8 cm posterior to the left heart.  3. Chronic mild mediastinal adenopathy is similar to 2019 exam and likely related to sarcoidosis.  4. Mild prominence of the main pulmonary artery suggesting pulmonary arterial hypertension.   Chest x-ray 06/08/2021: 1. Interval increase in size of the cardiac silhouette which may  reflect increased cardiomegaly or increased size of the known  pericardial effusion. Consider further evaluation with cardiac echo.  2. Tiny bilateral pleural effusions with bibasilar predominant  interstitial and airspace opacities likely reflecting pulmonary  edema.   Cardiac Studies:   Nuclear stress test 02/01/2016: No significant ST segment changes or arrhythmias were noted during stress  Negative ETT for ischemia at workload achieved  No significant chest pain symptoms reported  No significant arrhythmia noted  nuclear images report to follow   Right heart catheterization and myocardial biopsy 07/17/2016: Mild pulmonary hypertension, mildly elevated pulmonary capillary wedge pressure, biopsy negative for sarcoid.  Cardiac PET sarcoidosis with nuclear medicine perfusion 03/05/2020: A myocardial SPECT scan was performed at rest and contains a large, severe perfusion defect involving the anterior, lateral and anterior lateral walls of the left ventricle, necessitating a FDG-PET viability study.  Physiologic distribution of radiotracer. No substantial accumulation of radiotracer in the area of  perfusion defect. Other PET findings: Few FDG avid bilateral axillary lymph nodes, index right axillary node measuring 1 cm (SUV max 3.5). Additionally, there are a few hypermetabolic superior mediastinal lymph nodes. Index right upper paratracheal lymph node shows SUV max 2.2. CT: Few small nodules are noted in right upper lobe, similar to 12/02/2019 CT exam. Subsegmental atelectasis/scarring in left lower lobe. Left chest wall cardiac rhythm maintenance device with distal leads terminating in right atrial appendage and right ventricle and coronary sinus. Small fat-containing of umbilical hernia.   Echocardiogram 04/29/2021:  Left ventricle cavity is normal in size. Moderate asymmetric hypertrophy  of the left ventricle, with posterior wall measuring 1.5 cm.Moderate  global hypokinesis with basal inferolateral akinesis. Moderately depressed  LV systolic function with EF 35-40% Doppler evidence of grade II (pseudonormal) diastolic dysfunction, elevated LAP.  Left atrial cavity is mildly dilated at 4 cm.  Tethering of posterior leaflet with moderate (grade III), posteriorly directed mitral regurgitation.  Moderate loculated pericardial effusion with clear fluids. There is no apparent hemodynamic significance.  Compared to previous study on 09/03/2020, pericardial effusion increased  from mild. Mitral regurgitation is new.    ICD   Scheduled Remote ICD check 10/05/2020:  There were 2 atrial high rate episodes detected. The  longest lasted 47 seconds in duration. No EGMs available for review. There was a <1 % cumulative atrial arrhythmia burden. No VHR episodes.  Health trends (patient activity, heart rate variability, average heart rates) are stable. The review of the implantable cardiac monitoring data remains stable. Battery longevity is 6 years . RA pacing is 1 %, RV pacing is 98 %, and LV pacing is 98 %.  EKG   10/12/2020: AV paced rhythm at rate of 84 bpm.  Biventricular pacing detected.  Wide  QRS complex.  No further analysis.    03/12/2020: Underlying sinus rhythm normal sinus rhythm at the rate of 83 bpm, ventricularly paced rhythm.  No further analysis.  Biventricular pacemaker detected.    09/15/2019: V paced rhythm, no further analysis.   Assessment     ICD-10-CM   1. Nonischemic cardiomyopathy (HCC)  L79.8 Basic metabolic panel    Pro b natriuretic peptide (BNP)[facility])    Pro b natriuretic peptide (BNP)9[facility])    2. Chronic systolic CHF (congestive heart failure) (HCC)  X21.19 Basic metabolic panel    Pro b natriuretic peptide (BNP)9[facility] [facility])    Pro b natriuretic peptide (BNP)[facility])    3. Sinus tachycardia  R00.0 TSH    4. Pericardial effusion  I31.3 PCV ECHOCARDIOGRAM LIMITED       Meds ordered this encounter  Medications   torsemide (DEMADEX) 20 MG tablet    Sig: Take 1 tablet (20 mg total) by mouth 2 (two) times daily.    Dispense:  180 tablet    Refill:  3   sacubitril-valsartan (ENTRESTO) 24-26 MG    Sig: Take 1 tablet by mouth 2 (two) times daily.    Dispense:  60 tablet    Refill:  3      Medications Discontinued During This Encounter  Medication Reason   furosemide (LASIX) 40 MG tablet Change in therapy      Recommendations:   Alicia Cook  is a 55 y.o. 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 Cardiac MRI performed in 2011 and cardiac PET scan in August 2017 does not appear to be consistent with cardiac sarcoidosis.  She had repeat PET scan performed at [facility] on 03/05/2020 again confirming this.  She also has uncontrolled diabetes, morbid obesity, mild  sleep apnea  but not tolerating CPAP, systemic  hypertension.  Patient underwent laparoscopic gastric  sleeve on 12/20/2020 and so far has lost about 60 pounds in weight.    Patient presents for urgent visit with orthopnea ongoing for the last 1 week.  She has been intermittently taking Lasix, however has not noticed much diuresis.  We will discontinue Lasix and switch patient to torsemide 20 mg daily.  Patient's blood pressure is no longer soft, will therefore restart Entresto 24/26 mg twice daily.  We will repeat BMP in 1 week.  We will also obtain BNP and TSH at this time, particularly given patient's elevated heart rate at today's office visit.  Patient does have history of pericardial effusion, no evidence of pulsus paradoxus on exam.  We will therefore obtain repeat limited echo to reevaluate pericardial effusion.  Suspect patient's orthopnea is related to volume overload given recent chest x-ray with pulmonary edema.  However mitral regurgitation may be contributing as well.  Could consider evaluation for MitraClip in  the future if patient's symptoms or not improved with medical management.  Follow-up in 2 weeks, sooner if needed, for acute on chronic heart failure, pericardial effusion, orthopnea.  Patient was seen in collaboration with Dr. Einar Gip and he is in agreement with the plan.     Alethia Berthold, PA-C 06/10/2021, 2:15 PM Office: [phone]

## 2021-06-14 ENCOUNTER — Telehealth: Payer: Self-pay

## 2021-06-14 ENCOUNTER — Other Ambulatory Visit: Payer: Self-pay | Admitting: Student

## 2021-06-14 MED ORDER — POTASSIUM CHLORIDE ER 20 MEQ PO TBCR: 20.0000 meq | EXTENDED_RELEASE_TABLET | Freq: Every day | ORAL | 3 refills | Status: DC

## 2021-06-14 NOTE — Telephone Encounter (Signed)
Pt called to advise that she had her labs done this morning and wanted to know if you've reviewed them yet. Was especially concerned about the BNP results. Please review and advise. Send to clinical pool

## 2021-06-14 NOTE — Telephone Encounter (Signed)
Called patient, reviewed and discussed labs. Patient verbalized understanding and will have repeat labs done in 1-2 weeks.

## 2021-06-24 ENCOUNTER — Other Ambulatory Visit: Payer: Self-pay

## 2021-06-24 ENCOUNTER — Ambulatory Visit: Payer: Medicaid Other | Admitting: Student

## 2021-06-24 ENCOUNTER — Ambulatory Visit: Payer: Medicaid Other

## 2021-06-24 DIAGNOSIS — I313 Pericardial effusion (noninflammatory): Secondary | ICD-10-CM

## 2021-06-24 DIAGNOSIS — I3139 Other pericardial effusion (noninflammatory): Secondary | ICD-10-CM

## 2021-06-27 ENCOUNTER — Other Ambulatory Visit: Payer: Self-pay

## 2021-06-27 ENCOUNTER — Ambulatory Visit: Payer: Medicaid Other | Admitting: Student

## 2021-06-27 ENCOUNTER — Encounter: Payer: Self-pay | Admitting: Student

## 2021-06-27 VITALS — BP 120/69 | HR 103 | Temp 98.3°F | Resp 17 | Ht 67.0 in | Wt 220.0 lb

## 2021-06-27 DIAGNOSIS — I313 Pericardial effusion (noninflammatory): Secondary | ICD-10-CM

## 2021-06-27 DIAGNOSIS — I5022 Chronic systolic (congestive) heart failure: Secondary | ICD-10-CM

## 2021-06-27 DIAGNOSIS — I3139 Other pericardial effusion (noninflammatory): Secondary | ICD-10-CM

## 2021-06-27 DIAGNOSIS — R000 Tachycardia, unspecified: Secondary | ICD-10-CM

## 2021-06-27 MED ORDER — TORSEMIDE 20 MG PO TABS: 20.0000 mg | ORAL_TABLET | Freq: Every day | ORAL | 3 refills | Status: DC

## 2021-06-27 NOTE — Progress Notes (Signed)
Primary Physician/Referring:  [name], MD  Patient ID: Alicia Cook, female    DOB: [birth year], 55 y.o.   MRN: [id number]  Chief Complaint  Patient presents with   NONISCHEMIC CARDIOMYOPATHY    HPI:    Alicia Cook  is a 55 y.o. 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 Cardiac MRI performed in 2011 and cardiac PET scan in August 2017 does not appear to be consistent with cardiac sarcoidosis.  She had repeat PET scan performed at [facility] on 03/05/2020 again confirming this.  She also has uncontrolled diabetes, morbid obesity, mild sleep apnea  but not tolerating CPAP, systemic  hypertension.  Patient underwent laparoscopic gastric sleeve on 12/20/2020 and so far has lost about 60 pounds in weight.    Patient presents for 2-week follow-up of acute on chronic heart failure, pericardial effusion, and orthopnea.  Last office visit switch patient from Lasix to torsemide 20 mg daily and restart Entresto 24/26 mg twice daily.  Echocardiogram revealed severely depressed LVEF at 25% as well as new inferior and inferolateral wall motion abnormalities.  Patient reports orthopnea has resolved over the last 1 week with initiation of torsemide as well as Entresto.  Denies chest pain, dizziness, palpitations, syncope, near syncope.  Denies leg swelling, PND.   Past Medical History:  Diagnosis Date   Cataract    CHF (congestive heart failure) ([place])    Depression    Diabetes mellitus without complication ([place])    Encounter for adjustment of biventricular implantable cardioverter-defibrillator (ICD) 06/13/2019   Hypertension    ICD: Cardiac defibrillator in situ 01/13/2015   [employer] Bi-V ICD (Dynagen X4 CRT-D) 01/13/2015 at [facility] - MRI Compatible  Scheduled Remote ICD check 4.22.20:  1 SVT/8 secs, normal function. Battery longevity is 8 years. RA pacing is 1 %, RV pacing is 99 %, and LV pacing is 99 %.   Nonischemic cardiomyopathy ([place])    a. EF 35-40% by cath in 2011 with normal cors b. s/p ICD implantation in 12/2014 c. EF 25% by NST in 02/01/2016   Sarcoidosis    Sarcoidosis 02/08/2016   Past Surgical History:  Procedure Laterality Date   ABDOMINAL SURGERY  12/20/2020    laparoscopic sleeve gastrectomy   CARDIAC CATHETERIZATION N/A 02/08/2016   Procedure: Left Heart Cath and Coronary Angiography;  Surgeon: Jettie Booze, MD;  Location: [facility];  Service: Cardiovascular;  Laterality: N/A;   CATARACT EXTRACTION Right 12/04/2018   CESAREAN SECTION     CHOLECYSTECTOMY     INSERTION OF ICD     a. s/p dual-chamber [employer] ICD 12/2014   LAPAROSCOPIC GASTRIC SLEEVE RESECTION     PACEMAKER INSERTION     TUBAL LIGATION     Family History  Problem Relation Age of Onset   Heart attack Father        a. Initial onset in his 29's. S/p CABG   Heart failure Father     Social History   Tobacco Use   Smoking status: Former    Packs/day: 1.00    Years: 20.00    Pack years: 20.00    Types: Cigarettes    Quit date: 2016    Years since quitting: 6.6   Smokeless tobacco: Never  Substance Use Topics   Alcohol use: No    Alcohol/week: 0.0 standard drinks  Marital Status: Single  ROS  Review of Systems  Constitutional: Positive for weight loss (intentional). Negative for malaise/fatigue.  Cardiovascular:  Positive for dyspnea on exertion (improved). Negative for chest pain, claudication, leg swelling, near-syncope, orthopnea (resolved), palpitations, paroxysmal nocturnal dyspnea and syncope.  Neurological:  Negative for dizziness.  Objective  Blood pressure 120/69, pulse (!) 103, temperature 98.3 F (36.8 C), temperature source Temporal, resp. rate 17, height 5' 7"  (1.702 m), weight 220 lb (99.8 kg), last menstrual period 04/17/2016, SpO2 100  %.  Vitals with BMI 06/27/2021 06/10/2021 05/06/2021  Height 5' 7"  5' 7"  5' 7"   Weight 220 lbs 228 lbs 10 oz 233 lbs  BMI 34.45 75.1 02.58  Systolic 527 782 423  Diastolic 69 85 83  Pulse 536 102 92     Physical Exam Vitals reviewed.  Constitutional:      General: She is not in acute distress.    Appearance: She is well-developed.     Comments: Morbidly obese in no acute distress.  Neck:     Thyroid: No thyromegaly.     Vascular: No carotid bruit or JVD.  Cardiovascular:     Rate and Rhythm: Normal rate and regular rhythm.     Pulses: Intact distal pulses.          Carotid pulses are 2+ on the right side and 2+ on the left side.      Dorsalis pedis pulses are 2+ on the right side and 2+ on the left side.       Posterior tibial pulses are 2+ on the right side and 2+ on the left side.     Heart sounds: Normal heart sounds, S1 normal and S2 normal. No murmur heard.   No gallop.     Comments: No evidence of pulsus paradoxus on exam. Pulmonary:     Effort: Pulmonary effort is normal. No accessory muscle usage.     Breath sounds: Normal breath sounds. No wheezing or rales.  Abdominal:     Comments: Obese. Pannus present  Musculoskeletal:     Right lower leg: No edema.     Left lower leg: No edema.  Skin:    General: Skin is warm and dry.  Exam stable compared to previous   Laboratory examination:   CMP Latest Ref Rng & Units 04/19/2021 02/08/2021 02/24/2018  Glucose 70 - 99 mg/dL 100(H) 90 111(H)  BUN 6 - 20 mg/dL 15 22(H) 10  Creatinine 0.44 - 1.00 mg/dL 0.83 0.75 0.59  Sodium 135 - 145 mmol/L 135 138 138  Potassium 3.5 - 5.1 mmol/L 3.9 3.9 4.1  Chloride 98 - 111 mmol/L 102 106 107  CO2 22 - 32 mmol/L 26 25 24   Calcium 8.9 - 10.3 mg/dL 9.4 9.2 9.3  Total Protein 6.5 - 8.1 g/dL 7.6 7.3 -  Total Bilirubin 0.3 - 1.2 mg/dL 0.3 0.2(L) -  Alkaline Phos 38 - 126 U/L 93 73 -  AST 15 - 41 U/L 55(H) 37 -  ALT 0 - 44 U/L 65(H) 56(H) -   CBC Latest Ref Rng & Units 04/19/2021 02/08/2021  01/06/2018  WBC 4.0 - 10.5 K/uL 9.5 9.2 10.4  Hemoglobin 12.0 - 15.0 g/dL 14.0 13.1 12.6  Hematocrit 36.0 - 46.0 % 43.6 41.8 39.8  Platelets 150 - 400 K/uL 182 202 269   Lipid Panel     Component Value Date/Time   CHOL 165 02/09/2016 0700   TRIG 94 02/09/2016 0700   HDL 64 02/09/2016 0700   CHOLHDL  2.6 02/09/2016 0700   VLDL 19 02/09/2016 0700   LDLCALC 82 02/09/2016 0700   HEMOGLOBIN A1C No results found for: HGBA1C, MPG TSH No results for input(s): TSH in the last 8760 hours.  External labs:  06/14/2021: Sodium 140, potassium 3.4, glucose 79, BUN 18, creatinine 1.04, GFR 64 BNP 233 TSH 0.48  12/22/2020: Hb 12.1/HCT 36.8, platelets 227. Sodium 136, potassium 3.8, BUN 7, creatinine 0.69, EGFR >90 mL.  Serum glucose 106 mg.  08/31/2020: A1c 7.6%  07/20/2020: Glucose 101, sodium 139, potassium 4.2, BUN 12, creatinine 0.75, EGFR greater than 90 Hemoglobin 13.0, hematocrit 39.8, platelets 250  03/04/2020: BUN 14, creatinine 0.8, serum glucose 140 mg, EGFR >60 mL.  Magnesium 1.6. Total cholesterol 124, triglycerides 129, HDL 55, LDL 63.  Non-HDL cholesterol 69. Hb 13.1/HCT 39.3, platelets 207.  Allergies  No Known Allergies   Medications Prior to Visit:   Outpatient Medications Prior to Visit  Medication Sig Dispense Refill   folic acid (FOLVITE) 1 MG tablet Take 3 mg by mouth daily.      hydrOXYzine (VISTARIL) 25 MG capsule Take 25 mg by mouth as needed.     ivabradine (CORLANOR) 7.5 MG TABS tablet Take 1 tablet (7.5 mg total) by mouth 2 (two) times daily with a meal. 60 tablet 3   JARDIANCE 25 MG TABS tablet Take 25 mg by mouth daily.     LORazepam (ATIVAN) 0.5 MG tablet Take by mouth as needed.     metoprolol succinate (TOPROL-XL) 25 MG 24 hr tablet Take 1 tablet (25 mg total) by mouth daily. Take with or immediately following a meal. 90 tablet 3   pantoprazole (PROTONIX) 40 MG tablet Take 1 tablet by mouth daily.     potassium chloride 20 MEQ TBCR Take 20 mEq by  mouth daily. 90 tablet 3   sacubitril-valsartan (ENTRESTO) 24-26 MG Take 1 tablet by mouth 2 (two) times daily. 60 tablet 3   Semaglutide,0.25 or 0.5MG/DOS, (OZEMPIC, 0.25 OR 0.5 MG/DOSE,) 2 MG/1.5ML SOPN      Sertraline HCl 150 MG CAPS Take 150 mg by mouth daily.     Vitamin D, Ergocalciferol, (DRISDOL) 1.25 MG (50000 UT) CAPS capsule Take 50,000 Units by mouth once a week.     torsemide (DEMADEX) 20 MG tablet Take 1 tablet (20 mg total) by mouth 2 (two) times daily. 180 tablet 3   sertraline (ZOLOFT) 100 MG tablet Take 100 mg by mouth 2 (two) times daily.     No facility-administered medications prior to visit.   Final Medications at End of Visit    Current Meds  Medication Sig   folic acid (FOLVITE) 1 MG tablet Take 3 mg by mouth daily.    hydrOXYzine (VISTARIL) 25 MG capsule Take 25 mg by mouth as needed.   ivabradine (CORLANOR) 7.5 MG TABS tablet Take 1 tablet (7.5 mg total) by mouth 2 (two) times daily with a meal.   JARDIANCE 25 MG TABS tablet Take 25 mg by mouth daily.   LORazepam (ATIVAN) 0.5 MG tablet Take by mouth as needed.   metoprolol succinate (TOPROL-XL) 25 MG 24 hr tablet Take 1 tablet (25 mg total) by mouth daily. Take with or immediately following a meal.   pantoprazole (PROTONIX) 40 MG tablet Take 1 tablet by mouth daily.   potassium chloride 20 MEQ TBCR Take 20 mEq by mouth daily.   sacubitril-valsartan (ENTRESTO) 24-26 MG Take 1 tablet by mouth 2 (two) times daily.   Semaglutide,0.25 or 0.5MG/DOS, (OZEMPIC, 0.25 OR  0.5 MG/DOSE,) 2 MG/1.5ML SOPN    Sertraline HCl 150 MG CAPS Take 150 mg by mouth daily.   Vitamin D, Ergocalciferol, (DRISDOL) 1.25 MG (50000 UT) CAPS capsule Take 50,000 Units by mouth once a week.   [DISCONTINUED] torsemide (DEMADEX) 20 MG tablet Take 1 tablet (20 mg total) by mouth 2 (two) times daily.   Radiology:   DG Chest 2 View 04/19/2021 Stable cardiomegaly. Increased density posterior to the heart border at the lung base may represent atrial  enlargement or increasing pericardial effusion.   CT Angio Chest PE W and/or Wo Contrast 04/19/2021:   1. No pulmonary embolus.  2. Pericardial effusion has progressed from prior abdominal CT 2 months ago, now moderate to large in size measuring up to 2.8 cm posterior to the left heart.  3. Chronic mild mediastinal adenopathy is similar to 2019 exam and likely related to sarcoidosis.  4. Mild prominence of the main pulmonary artery suggesting pulmonary arterial hypertension.   Chest x-ray 06/08/2021: 1. Interval increase in size of the cardiac silhouette which may  reflect increased cardiomegaly or increased size of the known  pericardial effusion. Consider further evaluation with cardiac echo.  2. Tiny bilateral pleural effusions with bibasilar predominant  interstitial and airspace opacities likely reflecting pulmonary  edema.   Cardiac Studies:   Nuclear stress test 02/01/2016: No significant ST segment changes or arrhythmias were noted during stress  Negative ETT for ischemia at workload achieved  No significant chest pain symptoms reported  No significant arrhythmia noted  nuclear images report to follow   Right heart catheterization and myocardial biopsy 07/17/2016: Mild pulmonary hypertension, mildly elevated pulmonary capillary wedge pressure, biopsy negative for sarcoid.  Cardiac PET sarcoidosis with nuclear medicine perfusion 03/05/2020: A myocardial SPECT scan was performed at rest and contains a large, severe perfusion defect involving the anterior, lateral and anterior lateral walls of the left ventricle, necessitating a FDG-PET viability study.  Physiologic distribution of radiotracer. No substantial accumulation of radiotracer in the area of perfusion defect. Other PET findings: Few FDG avid bilateral axillary lymph nodes, index right axillary node measuring 1 cm (SUV max 3.5). Additionally, there are a few hypermetabolic superior mediastinal lymph nodes. Index right  upper paratracheal lymph node shows SUV max 2.2. CT: Few small nodules are noted in right upper lobe, similar to 12/02/2019 CT exam. Subsegmental atelectasis/scarring in left lower lobe. Left chest wall cardiac rhythm maintenance device with distal leads terminating in right atrial appendage and right ventricle and coronary sinus. Small fat-containing of umbilical hernia.   Echocardiogram 04/29/2021:  Left ventricle cavity is normal in size. Moderate asymmetric hypertrophy  of the left ventricle, with posterior wall measuring 1.5 cm.Moderate  global hypokinesis with basal inferolateral akinesis. Moderately depressed  LV systolic function with EF 35-40% Doppler evidence of grade II (pseudonormal) diastolic dysfunction, elevated LAP.  Left atrial cavity is mildly dilated at 4 cm.  Tethering of posterior leaflet with moderate (grade III), posteriorly directed mitral regurgitation.  Moderate loculated pericardial effusion with clear fluids. There is no apparent hemodynamic significance.  Compared to previous study on 09/03/2020, pericardial effusion increased  from mild. Mitral regurgitation is new.   Echocardiogram 06/24/2021:  Severely depressed LV systolic function with EF 25%. Left ventricle cavity  is moderately dilated. Dilated cardiomyopathy. Moderate eccentric  hypertrophy of the left ventricle. Doppler evidence of grade I (impaired)  diastolic dysfunction, normal LAP. Left ventricle regional wall motion  findings: Severe global hypokinesis. Basal inferolateral, Basal inferior,  Mid inferolateral and Mid  inferior akinesis. Calculated EF 25%.  Right ventricle cavity is normal in size. Right ventricle pacemaker  visualized. Normal right ventricular function.  MV annulus is dilated. Moderate (Grade II) mitral regurgitation.  Pericardium is normal. Small posterior pericardial effusion.  Compared to the study done on 04/29/2021, pericardial effusion now appears  much less significant.  However the  LVEF has reduced further from 35 to  40% to the present 20 to 25%. Regional wall motion abnormality new.   ICD   Scheduled Remote ICD check 10/05/2020:  There were 2 atrial high rate episodes detected. The longest lasted 47 seconds in duration. No EGMs available for review. There was a <1 % cumulative atrial arrhythmia burden. No VHR episodes.  Health trends (patient activity, heart rate variability, average heart rates) are stable. The review of the implantable cardiac monitoring data remains stable. Battery longevity is 6 years . RA pacing is 1 %, RV pacing is 98 %, and LV pacing is 98 %.  EKG   10/12/2020: AV paced rhythm at rate of 84 bpm.  Biventricular pacing detected.  Wide QRS complex.  No further analysis.    03/12/2020: Underlying sinus rhythm normal sinus rhythm at the rate of 83 bpm, ventricularly paced rhythm.  No further analysis.  Biventricular pacemaker detected.    09/15/2019: V paced rhythm, no further analysis.   Assessment     ICD-10-CM   1. Chronic systolic CHF (congestive heart failure) (HCC)  I50.22     2. Pericardial effusion  I31.3     3. Sinus tachycardia  R00.0      Meds ordered this encounter  Medications   torsemide (DEMADEX) 20 MG tablet    Sig: Take 1 tablet (20 mg total) by mouth daily.    Dispense:  180 tablet    Refill:  3    Medications Discontinued During This Encounter  Medication Reason   sertraline (ZOLOFT) 100 MG tablet Error   torsemide (DEMADEX) 20 MG tablet     Recommendations:   Alicia Cook  is a 55 y.o. 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 Cardiac MRI performed in 2011 and cardiac PET scan in August 2017 does not appear to be consistent with cardiac sarcoidosis.  She had repeat PET scan performed at [facility] on 03/05/2020 again confirming  this.  She also has uncontrolled diabetes, morbid obesity, mild  sleep apnea  but not tolerating CPAP, systemic  hypertension.  Patient underwent laparoscopic gastric sleeve on 12/20/2020 and so far has lost about 60 pounds in weight.    Patient presents for 2-week follow-up of acute on chronic heart failure, pericardial effusion, and orthopnea.  Last office visit switch patient from Lasix to torsemide 20 mg daily and restart Entresto 24/26 mg twice daily. Patient's symptoms of orthopnea have essentially resolved. Will reduce torsemide to 20 mg once daily, with additional doses as needed.   Reviewed and discussed with patient echocardiogram results above. Given further reduction of LVEF and new regional wall motion abnormalities, as well as shortness of breath, recommend right and left heart cath.   The left and right heart catheterization procedure was explained to the patient in detail. The indication, alternatives, risks and benefits were reviewed. Complications including but not limited to bleeding, infection, acute kidney injury, blood transfusion, heart rhythm disturbances, contrast (dye) reaction, damage to the arteries or nerves in the  legs or hands, cerebrovascular accident, myocardial infarction, need for emergent bypass surgery, blood clots in the legs, possible need for emergent blood transfusion, and rarely death were reviewed and discussed with the patient. The patient voices understanding and wishes to proceed.  Follow up after heart catheterization.   Patient was seen in collaboration with Dr. Einar Gip and he is in agreement with plan.    Alicia Berthold, PA-C 06/27/2021, 4:58 PM Office: [phone]

## 2021-06-29 ENCOUNTER — Other Ambulatory Visit: Payer: Self-pay

## 2021-06-29 DIAGNOSIS — I5022 Chronic systolic (congestive) heart failure: Secondary | ICD-10-CM

## 2021-06-29 DIAGNOSIS — I10 Essential (primary) hypertension: Secondary | ICD-10-CM

## 2021-06-29 DIAGNOSIS — I428 Other cardiomyopathies: Secondary | ICD-10-CM

## 2021-07-07 ENCOUNTER — Other Ambulatory Visit: Payer: Self-pay | Admitting: Student

## 2021-07-07 NOTE — Progress Notes (Signed)
07/05/2021: BNP 161 Hemoglobin 13.9, hematocrit 43.1, MCV 86.5, platelet 222 Sodium 140, potassium 3.6, BUN 22, creatinine 0.91, AST 33, ALT 40, GFR >60 TSH 1.0  Stable

## 2021-07-11 ENCOUNTER — Telehealth: Payer: Self-pay | Admitting: Student

## 2021-07-11 NOTE — Telephone Encounter (Signed)
Patient called the office concerned that she had been feeling better and this morning woke up with shortness of breath.  Advised patient to take torsemide 40 mg today.  She is scheduled for cardiac catheterization tomorrow morning.

## 2021-07-12 ENCOUNTER — Encounter (HOSPITAL_COMMUNITY): Payer: Self-pay | Admitting: Cardiology

## 2021-07-12 ENCOUNTER — Ambulatory Visit (HOSPITAL_COMMUNITY)
Admission: RE | Admit: 2021-07-12 | Discharge: 2021-07-12 | Disposition: A | Discharge status: Home / Self Care | Payer: Medicaid Other | Attending: Cardiology | Admitting: Cardiology

## 2021-07-12 ENCOUNTER — Encounter (HOSPITAL_COMMUNITY)
Admission: RE | Disposition: A | Discharge status: Home / Self Care | Payer: Self-pay | Source: Home / Self Care | Attending: Cardiology

## 2021-07-12 DIAGNOSIS — Z79899 Other long term (current) drug therapy: Secondary | ICD-10-CM | POA: Insufficient documentation

## 2021-07-12 DIAGNOSIS — Z8249 Family history of ischemic heart disease and other diseases of the circulatory system: Secondary | ICD-10-CM | POA: Diagnosis not present

## 2021-07-12 DIAGNOSIS — I5022 Chronic systolic (congestive) heart failure: Secondary | ICD-10-CM | POA: Diagnosis present

## 2021-07-12 DIAGNOSIS — D869 Sarcoidosis, unspecified: Secondary | ICD-10-CM | POA: Insufficient documentation

## 2021-07-12 DIAGNOSIS — Z7984 Long term (current) use of oral hypoglycemic drugs: Secondary | ICD-10-CM | POA: Insufficient documentation

## 2021-07-12 DIAGNOSIS — Z6835 Body mass index (BMI) 35.0-35.9, adult: Secondary | ICD-10-CM | POA: Diagnosis not present

## 2021-07-12 DIAGNOSIS — R000 Tachycardia, unspecified: Secondary | ICD-10-CM | POA: Diagnosis not present

## 2021-07-12 DIAGNOSIS — I110 Hypertensive heart disease with heart failure: Secondary | ICD-10-CM | POA: Diagnosis not present

## 2021-07-12 DIAGNOSIS — I428 Other cardiomyopathies: Secondary | ICD-10-CM | POA: Diagnosis not present

## 2021-07-12 DIAGNOSIS — Z87891 Personal history of nicotine dependence: Secondary | ICD-10-CM | POA: Diagnosis not present

## 2021-07-12 DIAGNOSIS — I313 Pericardial effusion (noninflammatory): Secondary | ICD-10-CM | POA: Diagnosis not present

## 2021-07-12 DIAGNOSIS — E1165 Type 2 diabetes mellitus with hyperglycemia: Secondary | ICD-10-CM | POA: Diagnosis not present

## 2021-07-12 DIAGNOSIS — G4730 Sleep apnea, unspecified: Secondary | ICD-10-CM | POA: Insufficient documentation

## 2021-07-12 HISTORY — PX: RIGHT/LEFT HEART CATH AND CORONARY ANGIOGRAPHY: CATH118266

## 2021-07-12 LAB — POCT I-STAT EG7
Acid-Base Excess: 6 mmol/L — ABNORMAL HIGH (ref 0.0–2.0)
Bicarbonate: 31.9 mmol/L — ABNORMAL HIGH (ref 20.0–28.0)
Calcium, Ion: 1.22 mmol/L (ref 1.15–1.40)
HCT: 39 % (ref 36.0–46.0)
Hemoglobin: 13.3 g/dL (ref 12.0–15.0)
O2 Saturation: 74 %
Potassium: 3.3 mmol/L — ABNORMAL LOW (ref 3.5–5.1)
Sodium: 142 mmol/L (ref 135–145)
TCO2: 33 mmol/L — ABNORMAL HIGH (ref 22–32)
pCO2, Ven: 51.8 mmHg (ref 44.0–60.0)
pH, Ven: 7.398 (ref 7.250–7.430)
pO2, Ven: 40 mmHg (ref 32.0–45.0)

## 2021-07-12 LAB — POCT I-STAT 7, (LYTES, BLD GAS, ICA,H+H)
Acid-Base Excess: 6 mmol/L — ABNORMAL HIGH (ref 0.0–2.0)
Bicarbonate: 30.8 mmol/L — ABNORMAL HIGH (ref 20.0–28.0)
Calcium, Ion: 1.19 mmol/L (ref 1.15–1.40)
HCT: 38 % (ref 36.0–46.0)
Hemoglobin: 12.9 g/dL (ref 12.0–15.0)
O2 Saturation: 100 %
Potassium: 3.2 mmol/L — ABNORMAL LOW (ref 3.5–5.1)
Sodium: 142 mmol/L (ref 135–145)
TCO2: 32 mmol/L (ref 22–32)
pCO2 arterial: 46.7 mmHg (ref 32.0–48.0)
pH, Arterial: 7.427 (ref 7.350–7.450)
pO2, Arterial: 166 mmHg — ABNORMAL HIGH (ref 83.0–108.0)

## 2021-07-12 LAB — GLUCOSE, CAPILLARY: Glucose-Capillary: 109 mg/dL — ABNORMAL HIGH (ref 70–99)

## 2021-07-12 SURGERY — RIGHT/LEFT HEART CATH AND CORONARY ANGIOGRAPHY
Anesthesia: LOCAL

## 2021-07-12 MED ORDER — SODIUM CHLORIDE 0.9 % IV SOLN: 250.0000 mL | INTRAVENOUS | Status: DC | PRN

## 2021-07-12 MED ORDER — ONDANSETRON HCL 4 MG/2ML IJ SOLN: 4.0000 mg | Freq: Four times a day (QID) | INTRAMUSCULAR | Status: DC | PRN

## 2021-07-12 MED ORDER — FENTANYL CITRATE (PF) 100 MCG/2ML IJ SOLN
INTRAMUSCULAR | Status: AC
  Filled 2021-07-12: qty 2

## 2021-07-12 MED ORDER — SODIUM CHLORIDE 0.9% FLUSH: 3.0000 mL | INTRAVENOUS | Status: DC | PRN

## 2021-07-12 MED ORDER — FENTANYL CITRATE (PF) 100 MCG/2ML IJ SOLN
INTRAMUSCULAR | Status: DC | PRN
  Administered 2021-07-12: 25 ug via INTRAVENOUS

## 2021-07-12 MED ORDER — VERAPAMIL HCL 2.5 MG/ML IV SOLN
INTRAVENOUS | Status: DC | PRN
  Administered 2021-07-12: 10 mL via INTRA_ARTERIAL

## 2021-07-12 MED ORDER — ACETAMINOPHEN 325 MG PO TABS: 650.0000 mg | ORAL_TABLET | ORAL | Status: DC | PRN

## 2021-07-12 MED ORDER — SODIUM CHLORIDE 0.9 % IV SOLN
INTRAVENOUS | Status: AC | PRN
  Administered 2021-07-12: 250 mL via INTRAVENOUS

## 2021-07-12 MED ORDER — HEPARIN (PORCINE) IN NACL 1000-0.9 UT/500ML-% IV SOLN
INTRAVENOUS | Status: AC
  Filled 2021-07-12: qty 500

## 2021-07-12 MED ORDER — HYDRALAZINE HCL 20 MG/ML IJ SOLN: 10.0000 mg | INTRAMUSCULAR | Status: DC | PRN

## 2021-07-12 MED ORDER — IOHEXOL 350 MG/ML SOLN
INTRAVENOUS | Status: DC | PRN
  Administered 2021-07-12: 20 mL

## 2021-07-12 MED ORDER — MIDAZOLAM HCL 2 MG/2ML IJ SOLN
INTRAMUSCULAR | Status: AC
  Filled 2021-07-12: qty 2

## 2021-07-12 MED ORDER — LIDOCAINE HCL (PF) 1 % IJ SOLN
INTRAMUSCULAR | Status: DC | PRN
  Administered 2021-07-12: 4 mL
  Administered 2021-07-12: 3 mL

## 2021-07-12 MED ORDER — LIDOCAINE HCL (PF) 1 % IJ SOLN
INTRAMUSCULAR | Status: AC
  Filled 2021-07-12: qty 30

## 2021-07-12 MED ORDER — LABETALOL HCL 5 MG/ML IV SOLN: 10.0000 mg | INTRAVENOUS | Status: DC | PRN

## 2021-07-12 MED ORDER — HEPARIN (PORCINE) IN NACL 1000-0.9 UT/500ML-% IV SOLN
INTRAVENOUS | Status: DC | PRN
  Administered 2021-07-12 (×2): 500 mL

## 2021-07-12 MED ORDER — HEPARIN SODIUM (PORCINE) 1000 UNIT/ML IJ SOLN
INTRAMUSCULAR | Status: AC
  Filled 2021-07-12: qty 1

## 2021-07-12 MED ORDER — SODIUM CHLORIDE 0.9% FLUSH: 3.0000 mL | Freq: Two times a day (BID) | INTRAVENOUS | Status: DC

## 2021-07-12 MED ORDER — ASPIRIN 81 MG PO CHEW
81.0000 mg | CHEWABLE_TABLET | ORAL | Status: AC
  Administered 2021-07-12: 81 mg via ORAL
  Filled 2021-07-12: qty 1

## 2021-07-12 MED ORDER — VERAPAMIL HCL 2.5 MG/ML IV SOLN
INTRAVENOUS | Status: AC
  Filled 2021-07-12: qty 2

## 2021-07-12 MED ORDER — HEPARIN SODIUM (PORCINE) 1000 UNIT/ML IJ SOLN
INTRAMUSCULAR | Status: DC | PRN
  Administered 2021-07-12: 5000 [IU] via INTRAVENOUS

## 2021-07-12 MED ORDER — MIDAZOLAM HCL 2 MG/2ML IJ SOLN
INTRAMUSCULAR | Status: DC | PRN
  Administered 2021-07-12: 1 mg via INTRAVENOUS

## 2021-07-12 MED ORDER — SODIUM CHLORIDE 0.9 % IV SOLN: INTRAVENOUS | Status: DC

## 2021-07-12 SURGICAL SUPPLY — 12 items
CATH OPTITORQUE TIG 4.0 5F (CATHETERS) ×2 IMPLANT
CATH SWAN GANZ 7F STRAIGHT (CATHETERS) ×2 IMPLANT
DEVICE RAD COMP TR BAND LRG (VASCULAR PRODUCTS) ×2 IMPLANT
GLIDESHEATH SLEND A-KIT 6F 22G (SHEATH) ×2 IMPLANT
GLIDESHEATH SLENDER 7FR .021G (SHEATH) ×2 IMPLANT
GUIDEWIRE INQWIRE 1.5J.035X260 (WIRE) ×1 IMPLANT
INQWIRE 1.5J .035X260CM (WIRE) ×2
KIT HEART LEFT (KITS) ×2 IMPLANT
PACK CARDIAC CATHETERIZATION (CUSTOM PROCEDURE TRAY) ×2 IMPLANT
SHEATH PROBE COVER 6X72 (BAG) ×2 IMPLANT
TRANSDUCER W/STOPCOCK (MISCELLANEOUS) ×2 IMPLANT
TUBING CIL FLEX 10 FLL-RA (TUBING) ×2 IMPLANT

## 2021-07-12 NOTE — Interval H&P Note (Signed)
History and Physical Interval Note:  07/12/2021 10:52 AM  Alicia Cook  has presented today for surgery, with the diagnosis of heart failure.  The various methods of treatment have been discussed with the patient and family. After consideration of risks, benefits and other options for treatment, the patient has consented to  Procedure(s): RIGHT/LEFT HEART CATH AND CORONARY ANGIOGRAPHY (N/A) as a surgical intervention.  The patient's history has been reviewed, patient examined, no change in status, stable for surgery.  I have reviewed the patient's chart and labs.  Questions were answered to the patient's satisfaction.    2012 Appropriate Use Criteria for Diagnostic Catheterization Cardiomyopathies (Right and Left Heart Catheterization OR Right Heart Catheterization Alone With/Without Left Ventriculography and Coronary Angiography) Indication:  Known or suspected cardiomyopathy with or without heart failure A (7) Indication: 93; Score 7   Alicia Cook

## 2021-07-12 NOTE — H&P (Signed)
OV 06/27/2021 copied for documetnation    Primary Physician/Referring:  [name], MD  Patient ID: Alicia Cook, female    DOB: 07-27-1966, 55 y.o.   MRN: [id number]  Chief Complaint  Patient presents with   NONISCHEMIC CARDIOMYOPATHY    HPI:    Alicia Cook  is a 55 y.o. 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 Cardiac MRI performed in 2011 and cardiac PET scan in August 2017 does not appear to be consistent with cardiac sarcoidosis.  She had repeat PET scan performed at [facility] on 03/05/2020 again confirming this.  She also has uncontrolled diabetes, morbid obesity, mild sleep apnea  but not tolerating CPAP, systemic  hypertension.  Patient underwent laparoscopic gastric sleeve on 12/20/2020 and so far has lost about 60 pounds in weight.    Patient presents for 2-week follow-up of acute on chronic heart failure, pericardial effusion, and orthopnea.  Last office visit switch patient from Lasix to torsemide 20 mg daily and restart Entresto 24/26 mg twice daily.  Echocardiogram revealed severely depressed LVEF at 25% as well as new inferior and inferolateral wall motion abnormalities.  Patient reports orthopnea has resolved over the last 1 week with initiation of torsemide as well as Entresto.  Denies chest pain, dizziness, palpitations, syncope, near syncope.  Denies leg swelling, PND.   Past Medical History:  Diagnosis Date   Cataract    CHF (congestive heart failure) ([place])    Depression    Diabetes mellitus without complication ([place])    Encounter for adjustment of biventricular implantable cardioverter-defibrillator (ICD) 06/13/2019   Hypertension    ICD: Cardiac defibrillator in situ 01/13/2015   [employer] Bi-V ICD (Dynagen X4 CRT-D) 01/13/2015 at [facility] - MRI  Compatible  Scheduled Remote ICD check 4.22.20: 1 SVT/8 secs, normal function. Battery longevity is 8 years. RA pacing is 1 %, RV pacing is 99 %, and LV pacing is 99 %.   Nonischemic cardiomyopathy ([place])    a. EF 35-40% by cath in 2011 with normal cors b. s/p ICD implantation in 12/2014 c. EF 25% by NST in 02/01/2016   Sarcoidosis    Sarcoidosis 02/08/2016   Past Surgical History:  Procedure Laterality Date   ABDOMINAL SURGERY  12/20/2020    laparoscopic sleeve gastrectomy   CARDIAC CATHETERIZATION N/A 02/08/2016   Procedure: Left Heart Cath and Coronary Angiography;  Surgeon: Jettie Booze, MD;  Location: [facility];  Service: Cardiovascular;  Laterality: N/A;   CATARACT EXTRACTION Right 12/04/2018   CESAREAN SECTION     CHOLECYSTECTOMY     INSERTION OF ICD     a. s/p dual-chamber [employer] ICD 12/2014   LAPAROSCOPIC GASTRIC SLEEVE RESECTION     PACEMAKER INSERTION     TUBAL LIGATION     Family History  Problem Relation Age of Onset   Heart attack Father        a. Initial onset in his 52's. S/p CABG   Heart failure Father     Social History   Tobacco Use   Smoking status: Former    Packs/day: 1.00    Years: 20.00    Pack years: 20.00    Types: Cigarettes    Quit date: 2016    Years since quitting: 6.6   Smokeless tobacco: Never  Substance Use Topics   Alcohol use: No  Alcohol/week: 0.0 standard drinks   Marital Status: Single  ROS  Review of Systems  Constitutional: Positive for weight loss (intentional). Negative for malaise/fatigue.  Cardiovascular:  Positive for dyspnea on exertion (improved). Negative for chest pain, claudication, leg swelling, near-syncope, orthopnea (resolved), palpitations, paroxysmal nocturnal dyspnea and syncope.  Neurological:  Negative for dizziness.  Objective  Blood pressure 120/69, pulse (!) 103, temperature 98.3 F (36.8 C), temperature source Temporal, resp. rate 17, height 5' 7"  (1.702 m), weight 220 lb (99.8  kg), last menstrual period 04/17/2016, SpO2 100 %.  Vitals with BMI 06/27/2021 06/10/2021 05/06/2021  Height 5' 7"  5' 7"  5' 7"   Weight 220 lbs 228 lbs 10 oz 233 lbs  BMI 34.45 61.9 50.93  Systolic 267 124 580  Diastolic 69 85 83  Pulse 998 102 92     Physical Exam Vitals reviewed.  Constitutional:      General: She is not in acute distress.    Appearance: She is well-developed.     Comments: Morbidly obese in no acute distress.  Neck:     Thyroid: No thyromegaly.     Vascular: No carotid bruit or JVD.  Cardiovascular:     Rate and Rhythm: Normal rate and regular rhythm.     Pulses: Intact distal pulses.          Carotid pulses are 2+ on the right side and 2+ on the left side.      Dorsalis pedis pulses are 2+ on the right side and 2+ on the left side.       Posterior tibial pulses are 2+ on the right side and 2+ on the left side.     Heart sounds: Normal heart sounds, S1 normal and S2 normal. No murmur heard.   No gallop.     Comments: No evidence of pulsus paradoxus on exam. Pulmonary:     Effort: Pulmonary effort is normal. No accessory muscle usage.     Breath sounds: Normal breath sounds. No wheezing or rales.  Abdominal:     Comments: Obese. Pannus present  Musculoskeletal:     Right lower leg: No edema.     Left lower leg: No edema.  Skin:    General: Skin is warm and dry.  Exam stable compared to previous   Laboratory examination:   CMP Latest Ref Rng & Units 04/19/2021 02/08/2021 02/24/2018  Glucose 70 - 99 mg/dL 100(H) 90 111(H)  BUN 6 - 20 mg/dL 15 22(H) 10  Creatinine 0.44 - 1.00 mg/dL 0.83 0.75 0.59  Sodium 135 - 145 mmol/L 135 138 138  Potassium 3.5 - 5.1 mmol/L 3.9 3.9 4.1  Chloride 98 - 111 mmol/L 102 106 107  CO2 22 - 32 mmol/L 26 25 24   Calcium 8.9 - 10.3 mg/dL 9.4 9.2 9.3  Total Protein 6.5 - 8.1 g/dL 7.6 7.3 -  Total Bilirubin 0.3 - 1.2 mg/dL 0.3 0.2(L) -  Alkaline Phos 38 - 126 U/L 93 73 -  AST 15 - 41 U/L 55(H) 37 -  ALT 0 - 44 U/L 65(H) 56(H) -    CBC Latest Ref Rng & Units 04/19/2021 02/08/2021 01/06/2018  WBC 4.0 - 10.5 K/uL 9.5 9.2 10.4  Hemoglobin 12.0 - 15.0 g/dL 14.0 13.1 12.6  Hematocrit 36.0 - 46.0 % 43.6 41.8 39.8  Platelets 150 - 400 K/uL 182 202 269   Lipid Panel     Component Value Date/Time   CHOL 165 02/09/2016 0700   TRIG 94 02/09/2016 0700   HDL  64 02/09/2016 0700   CHOLHDL 2.6 02/09/2016 0700   VLDL 19 02/09/2016 0700   LDLCALC 82 02/09/2016 0700   HEMOGLOBIN A1C No results found for: HGBA1C, MPG TSH No results for input(s): TSH in the last 8760 hours.  External labs:  06/14/2021: Sodium 140, potassium 3.4, glucose 79, BUN 18, creatinine 1.04, GFR 64 BNP 233 TSH 0.48  12/22/2020: Hb 12.1/HCT 36.8, platelets 227. Sodium 136, potassium 3.8, BUN 7, creatinine 0.69, EGFR >90 mL.  Serum glucose 106 mg.  08/31/2020: A1c 7.6%  07/20/2020: Glucose 101, sodium 139, potassium 4.2, BUN 12, creatinine 0.75, EGFR greater than 90 Hemoglobin 13.0, hematocrit 39.8, platelets 250  03/04/2020: BUN 14, creatinine 0.8, serum glucose 140 mg, EGFR >60 mL.  Magnesium 1.6. Total cholesterol 124, triglycerides 129, HDL 55, LDL 63.  Non-HDL cholesterol 69. Hb 13.1/HCT 39.3, platelets 207.  Allergies  No Known Allergies   Medications Prior to Visit:   Outpatient Medications Prior to Visit  Medication Sig Dispense Refill   folic acid (FOLVITE) 1 MG tablet Take 3 mg by mouth daily.      hydrOXYzine (VISTARIL) 25 MG capsule Take 25 mg by mouth as needed.     ivabradine (CORLANOR) 7.5 MG TABS tablet Take 1 tablet (7.5 mg total) by mouth 2 (two) times daily with a meal. 60 tablet 3   JARDIANCE 25 MG TABS tablet Take 25 mg by mouth daily.     LORazepam (ATIVAN) 0.5 MG tablet Take by mouth as needed.     metoprolol succinate (TOPROL-XL) 25 MG 24 hr tablet Take 1 tablet (25 mg total) by mouth daily. Take with or immediately following a meal. 90 tablet 3   pantoprazole (PROTONIX) 40 MG tablet Take 1 tablet by mouth daily.      potassium chloride 20 MEQ TBCR Take 20 mEq by mouth daily. 90 tablet 3   sacubitril-valsartan (ENTRESTO) 24-26 MG Take 1 tablet by mouth 2 (two) times daily. 60 tablet 3   Semaglutide,0.25 or 0.5MG/DOS, (OZEMPIC, 0.25 OR 0.5 MG/DOSE,) 2 MG/1.5ML SOPN      Sertraline HCl 150 MG CAPS Take 150 mg by mouth daily.     Vitamin D, Ergocalciferol, (DRISDOL) 1.25 MG (50000 UT) CAPS capsule Take 50,000 Units by mouth once a week.     torsemide (DEMADEX) 20 MG tablet Take 1 tablet (20 mg total) by mouth 2 (two) times daily. 180 tablet 3   sertraline (ZOLOFT) 100 MG tablet Take 100 mg by mouth 2 (two) times daily.     No facility-administered medications prior to visit.   Final Medications at End of Visit    Current Meds  Medication Sig   folic acid (FOLVITE) 1 MG tablet Take 3 mg by mouth daily.    hydrOXYzine (VISTARIL) 25 MG capsule Take 25 mg by mouth as needed.   ivabradine (CORLANOR) 7.5 MG TABS tablet Take 1 tablet (7.5 mg total) by mouth 2 (two) times daily with a meal.   JARDIANCE 25 MG TABS tablet Take 25 mg by mouth daily.   LORazepam (ATIVAN) 0.5 MG tablet Take by mouth as needed.   metoprolol succinate (TOPROL-XL) 25 MG 24 hr tablet Take 1 tablet (25 mg total) by mouth daily. Take with or immediately following a meal.   pantoprazole (PROTONIX) 40 MG tablet Take 1 tablet by mouth daily.   potassium chloride 20 MEQ TBCR Take 20 mEq by mouth daily.   sacubitril-valsartan (ENTRESTO) 24-26 MG Take 1 tablet by mouth 2 (two) times daily.  Semaglutide,0.25 or 0.5MG/DOS, (OZEMPIC, 0.25 OR 0.5 MG/DOSE,) 2 MG/1.5ML SOPN    Sertraline HCl 150 MG CAPS Take 150 mg by mouth daily.   Vitamin D, Ergocalciferol, (DRISDOL) 1.25 MG (50000 UT) CAPS capsule Take 50,000 Units by mouth once a week.   [DISCONTINUED] torsemide (DEMADEX) 20 MG tablet Take 1 tablet (20 mg total) by mouth 2 (two) times daily.   Radiology:   DG Chest 2 View 04/19/2021 Stable cardiomegaly. Increased density posterior to the  heart border at the lung base may represent atrial enlargement or increasing pericardial effusion.   CT Angio Chest PE W and/or Wo Contrast 04/19/2021:   1. No pulmonary embolus.  2. Pericardial effusion has progressed from prior abdominal CT 2 months ago, now moderate to large in size measuring up to 2.8 cm posterior to the left heart.  3. Chronic mild mediastinal adenopathy is similar to 2019 exam and likely related to sarcoidosis.  4. Mild prominence of the main pulmonary artery suggesting pulmonary arterial hypertension.   Chest x-ray 06/08/2021: 1. Interval increase in size of the cardiac silhouette which may  reflect increased cardiomegaly or increased size of the known  pericardial effusion. Consider further evaluation with cardiac echo.  2. Tiny bilateral pleural effusions with bibasilar predominant  interstitial and airspace opacities likely reflecting pulmonary  edema.   Cardiac Studies:   Nuclear stress test 02/01/2016: No significant ST segment changes or arrhythmias were noted during stress  Negative ETT for ischemia at workload achieved  No significant chest pain symptoms reported  No significant arrhythmia noted  nuclear images report to follow   Right heart catheterization and myocardial biopsy 07/17/2016: Mild pulmonary hypertension, mildly elevated pulmonary capillary wedge pressure, biopsy negative for sarcoid.  Cardiac PET sarcoidosis with nuclear medicine perfusion 03/05/2020: A myocardial SPECT scan was performed at rest and contains a large, severe perfusion defect involving the anterior, lateral and anterior lateral walls of the left ventricle, necessitating a FDG-PET viability study.  Physiologic distribution of radiotracer. No substantial accumulation of radiotracer in the area of perfusion defect. Other PET findings: Few FDG avid bilateral axillary lymph nodes, index right axillary node measuring 1 cm (SUV max 3.5). Additionally, there are a few  hypermetabolic superior mediastinal lymph nodes. Index right upper paratracheal lymph node shows SUV max 2.2. CT: Few small nodules are noted in right upper lobe, similar to 12/02/2019 CT exam. Subsegmental atelectasis/scarring in left lower lobe. Left chest wall cardiac rhythm maintenance device with distal leads terminating in right atrial appendage and right ventricle and coronary sinus. Small fat-containing of umbilical hernia.   Echocardiogram 04/29/2021:  Left ventricle cavity is normal in size. Moderate asymmetric hypertrophy  of the left ventricle, with posterior wall measuring 1.5 cm.Moderate  global hypokinesis with basal inferolateral akinesis. Moderately depressed  LV systolic function with EF 35-40% Doppler evidence of grade II (pseudonormal) diastolic dysfunction, elevated LAP.  Left atrial cavity is mildly dilated at 4 cm.  Tethering of posterior leaflet with moderate (grade III), posteriorly directed mitral regurgitation.  Moderate loculated pericardial effusion with clear fluids. There is no apparent hemodynamic significance.  Compared to previous study on 09/03/2020, pericardial effusion increased  from mild. Mitral regurgitation is new.   Echocardiogram 06/24/2021:  Severely depressed LV systolic function with EF 25%. Left ventricle cavity  is moderately dilated. Dilated cardiomyopathy. Moderate eccentric  hypertrophy of the left ventricle. Doppler evidence of grade I (impaired)  diastolic dysfunction, normal LAP. Left ventricle regional wall motion  findings: Severe global hypokinesis. Basal inferolateral, Basal  inferior,  Mid inferolateral and Mid inferior akinesis. Calculated EF 25%.  Right ventricle cavity is normal in size. Right ventricle pacemaker  visualized. Normal right ventricular function.  MV annulus is dilated. Moderate (Grade II) mitral regurgitation.  Pericardium is normal. Small posterior pericardial effusion.  Compared to the study done on 04/29/2021,  pericardial effusion now appears  much less significant.  However the LVEF has reduced further from 35 to  40% to the present 20 to 25%. Regional wall motion abnormality new.   ICD   Scheduled Remote ICD check 10/05/2020:  There were 2 atrial high rate episodes detected. The longest lasted 47 seconds in duration. No EGMs available for review. There was a <1 % cumulative atrial arrhythmia burden. No VHR episodes.  Health trends (patient activity, heart rate variability, average heart rates) are stable. The review of the implantable cardiac monitoring data remains stable. Battery longevity is 6 years . RA pacing is 1 %, RV pacing is 98 %, and LV pacing is 98 %.  EKG   10/12/2020: AV paced rhythm at rate of 84 bpm.  Biventricular pacing detected.  Wide QRS complex.  No further analysis.    03/12/2020: Underlying sinus rhythm normal sinus rhythm at the rate of 83 bpm, ventricularly paced rhythm.  No further analysis.  Biventricular pacemaker detected.    09/15/2019: V paced rhythm, no further analysis.   Assessment     ICD-10-CM   1. Chronic systolic CHF (congestive heart failure) (HCC)  I50.22     2. Pericardial effusion  I31.3     3. Sinus tachycardia  R00.0      Meds ordered this encounter  Medications   torsemide (DEMADEX) 20 MG tablet    Sig: Take 1 tablet (20 mg total) by mouth daily.    Dispense:  180 tablet    Refill:  3    Medications Discontinued During This Encounter  Medication Reason   sertraline (ZOLOFT) 100 MG tablet Error   torsemide (DEMADEX) 20 MG tablet     Recommendations:   Alicia Cook  is a 55 y.o. 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 Cardiac MRI performed in 2011 and cardiac PET scan in August 2017 does not appear to be consistent with cardiac sarcoidosis.  She had repeat PET  scan performed at [facility] on 03/05/2020 again confirming this.  She also has uncontrolled diabetes, morbid obesity, mild  sleep apnea  but not tolerating CPAP, systemic  hypertension.  Patient underwent laparoscopic gastric sleeve on 12/20/2020 and so far has lost about 60 pounds in weight.    Patient presents for 2-week follow-up of acute on chronic heart failure, pericardial effusion, and orthopnea.  Last office visit switch patient from Lasix to torsemide 20 mg daily and restart Entresto 24/26 mg twice daily. Patient's symptoms of orthopnea have essentially resolved. Will reduce torsemide to 20 mg once daily, with additional doses as needed.   Reviewed and discussed with patient echocardiogram results above. Given further reduction of LVEF and new regional wall motion abnormalities, as well as shortness of breath, recommend right and left heart cath.   The left and right heart catheterization procedure was explained to the patient in detail. The indication, alternatives, risks and benefits were reviewed. Complications including but not limited to bleeding, infection, acute kidney injury, blood transfusion, heart rhythm disturbances, contrast (dye) reaction, damage to  the arteries or nerves in the legs or hands, cerebrovascular accident, myocardial infarction, need for emergent bypass surgery, blood clots in the legs, possible need for emergent blood transfusion, and rarely death were reviewed and discussed with the patient. The patient voices understanding and wishes to proceed.  Follow up after heart catheterization.   Patient was seen in collaboration with Dr. Einar Gip and he is in agreement with plan.    Alethia Berthold, PA-C 06/27/2021, 4:58 PM Office: [phone]

## 2021-07-12 NOTE — Progress Notes (Signed)
Discharge instructions reviewed with pt and her son both voice understanding.  

## 2021-07-12 NOTE — Discharge Instructions (Signed)
Radial Site Care  This sheet gives you information about how to care for yourself after your procedure. Your health care provider may also give you more specific instructions. If you have problems or questions, contact your health care provider. What can I expect after the procedure? After the procedure, it is common to have: Bruising and tenderness at the catheter insertion area. Follow these instructions at home: Medicines Take over-the-counter and prescription medicines only as told by your health care provider. Insertion site care Follow instructions from your health care provider about how to take care of your insertion site. Make sure you: Wash your hands with soap and water before you remove your bandage (dressing). If soap and water are not available, use hand sanitizer. May remove dressing in 24 hours. Check your insertion site every day for signs of infection. Check for: Redness, swelling, or pain. Fluid or blood. Pus or a bad smell. Warmth. Do no take baths, swim, or use a hot tub for 5 days. You may shower 24-48 hours after the procedure. Remove the dressing and gently wash the site with plain soap and water. Pat the area dry with a clean towel. Do not rub the site. That could cause bleeding. Do not apply powder or lotion to the site. Activity  For 24 hours after the procedure, or as directed by your health care provider: Do not flex or bend the affected arm. Do not push or pull heavy objects with the affected arm. Do not drive yourself home from the hospital or clinic. You may drive 24 hours after the procedure. Do not operate machinery or power tools. KEEP ARM ELEVATED THE REMAINDER OF THE DAY. Do not push, pull or lift anything that is heavier than 10 lb for 5 days. Ask your health care provider when it is okay to: Return to work or school. Resume usual physical activities or sports. Resume sexual activity. General instructions If the catheter site starts to  bleed, raise your arm and put firm pressure on the site. If the bleeding does not stop, get help right away. This is a medical emergency. DRINK PLENTY OF FLUIDS FOR THE NEXT 2-3 DAYS. No alcohol consumption for 24 hours after receiving sedation. If you went home on the same day as your procedure, a responsible adult should be with you for the first 24 hours after you arrive home. Keep all follow-up visits as told by your health care provider. This is important. Contact a health care provider if: You have a fever. You have redness, swelling, or yellow drainage around your insertion site. Get help right away if: You have unusual pain at the radial site. The catheter insertion area swells very fast. The insertion area is bleeding, and the bleeding does not stop when you hold steady pressure on the area. Your arm or hand becomes pale, cool, tingly, or numb. These symptoms may represent a serious problem that is an emergency. Do not wait to see if the symptoms will go away. Get medical help right away. Call your local emergency services (911 in the [place] Do not drive yourself to the hospital. Summary After the procedure, it is common to have bruising and tenderness at the site. Follow instructions from your health care provider about how to take care of your radial site wound. Check the wound every day for signs of infection.  This information is not intended to replace advice given to you by your health care provider. Make sure you discuss any questions you have with   your health care provider. Document Revised: 11/21/2017 Document Reviewed: 11/21/2017 Elsevier Patient Education  2020 [employer]  

## 2021-07-16 ENCOUNTER — Inpatient Hospital Stay (HOSPITAL_BASED_OUTPATIENT_CLINIC_OR_DEPARTMENT_OTHER)
Admission: EM | Admit: 2021-07-16 | Discharge: 2021-07-19 | DRG: 372 | Disposition: A | Discharge status: Home / Self Care | Payer: Medicaid Other | Attending: Internal Medicine | Admitting: Internal Medicine

## 2021-07-16 ENCOUNTER — Other Ambulatory Visit: Payer: Self-pay

## 2021-07-16 ENCOUNTER — Encounter (HOSPITAL_BASED_OUTPATIENT_CLINIC_OR_DEPARTMENT_OTHER): Payer: Self-pay | Admitting: *Deleted

## 2021-07-16 ENCOUNTER — Emergency Department (HOSPITAL_BASED_OUTPATIENT_CLINIC_OR_DEPARTMENT_OTHER): Payer: Medicaid Other

## 2021-07-16 DIAGNOSIS — I110 Hypertensive heart disease with heart failure: Secondary | ICD-10-CM | POA: Diagnosis present

## 2021-07-16 DIAGNOSIS — I3139 Other pericardial effusion (noninflammatory): Secondary | ICD-10-CM

## 2021-07-16 DIAGNOSIS — I959 Hypotension, unspecified: Secondary | ICD-10-CM

## 2021-07-16 DIAGNOSIS — Z9049 Acquired absence of other specified parts of digestive tract: Secondary | ICD-10-CM

## 2021-07-16 DIAGNOSIS — Z9884 Bariatric surgery status: Secondary | ICD-10-CM

## 2021-07-16 DIAGNOSIS — R519 Headache, unspecified: Secondary | ICD-10-CM | POA: Diagnosis present

## 2021-07-16 DIAGNOSIS — F32A Depression, unspecified: Secondary | ICD-10-CM | POA: Diagnosis present

## 2021-07-16 DIAGNOSIS — R6510 Systemic inflammatory response syndrome (SIRS) of non-infectious origin without acute organ dysfunction: Secondary | ICD-10-CM | POA: Diagnosis present

## 2021-07-16 DIAGNOSIS — Z95810 Presence of automatic (implantable) cardiac defibrillator: Secondary | ICD-10-CM

## 2021-07-16 DIAGNOSIS — Z9841 Cataract extraction status, right eye: Secondary | ICD-10-CM

## 2021-07-16 DIAGNOSIS — Z79899 Other long term (current) drug therapy: Secondary | ICD-10-CM

## 2021-07-16 DIAGNOSIS — R079 Chest pain, unspecified: Secondary | ICD-10-CM | POA: Diagnosis present

## 2021-07-16 DIAGNOSIS — F419 Anxiety disorder, unspecified: Secondary | ICD-10-CM | POA: Diagnosis present

## 2021-07-16 DIAGNOSIS — Z87891 Personal history of nicotine dependence: Secondary | ICD-10-CM

## 2021-07-16 DIAGNOSIS — I428 Other cardiomyopathies: Secondary | ICD-10-CM

## 2021-07-16 DIAGNOSIS — E119 Type 2 diabetes mellitus without complications: Secondary | ICD-10-CM | POA: Diagnosis present

## 2021-07-16 DIAGNOSIS — I951 Orthostatic hypotension: Secondary | ICD-10-CM

## 2021-07-16 DIAGNOSIS — A020 Salmonella enteritis: Principal | ICD-10-CM | POA: Diagnosis present

## 2021-07-16 DIAGNOSIS — Z20822 Contact with and (suspected) exposure to covid-19: Secondary | ICD-10-CM | POA: Diagnosis present

## 2021-07-16 DIAGNOSIS — G4733 Obstructive sleep apnea (adult) (pediatric): Secondary | ICD-10-CM | POA: Diagnosis present

## 2021-07-16 DIAGNOSIS — Z7984 Long term (current) use of oral hypoglycemic drugs: Secondary | ICD-10-CM

## 2021-07-16 DIAGNOSIS — R509 Fever, unspecified: Secondary | ICD-10-CM | POA: Diagnosis present

## 2021-07-16 DIAGNOSIS — Z8249 Family history of ischemic heart disease and other diseases of the circulatory system: Secondary | ICD-10-CM

## 2021-07-16 DIAGNOSIS — R7982 Elevated C-reactive protein (CRP): Secondary | ICD-10-CM | POA: Diagnosis present

## 2021-07-16 DIAGNOSIS — E876 Hypokalemia: Secondary | ICD-10-CM | POA: Diagnosis present

## 2021-07-16 DIAGNOSIS — I272 Pulmonary hypertension, unspecified: Secondary | ICD-10-CM | POA: Diagnosis present

## 2021-07-16 DIAGNOSIS — Z6835 Body mass index (BMI) 35.0-35.9, adult: Secondary | ICD-10-CM

## 2021-07-16 DIAGNOSIS — I420 Dilated cardiomyopathy: Secondary | ICD-10-CM | POA: Diagnosis present

## 2021-07-16 DIAGNOSIS — I313 Pericardial effusion (noninflammatory): Secondary | ICD-10-CM | POA: Diagnosis present

## 2021-07-16 DIAGNOSIS — D869 Sarcoidosis, unspecified: Secondary | ICD-10-CM | POA: Diagnosis present

## 2021-07-16 DIAGNOSIS — I4510 Unspecified right bundle-branch block: Secondary | ICD-10-CM | POA: Diagnosis present

## 2021-07-16 DIAGNOSIS — I5022 Chronic systolic (congestive) heart failure: Secondary | ICD-10-CM

## 2021-07-16 LAB — URINALYSIS, MICROSCOPIC (REFLEX): RBC / HPF: NONE SEEN RBC/hpf (ref 0–5)

## 2021-07-16 LAB — URINALYSIS, ROUTINE W REFLEX MICROSCOPIC
Bilirubin Urine: NEGATIVE
Glucose, UA: >=500 mg/dL — AB
Hgb urine dipstick: NEGATIVE
Ketones, ur: NEGATIVE mg/dL
Leukocytes,Ua: NEGATIVE
Nitrite: NEGATIVE
Protein, ur: NEGATIVE mg/dL
Specific Gravity, Urine: 1.015 (ref 1.005–1.030)
pH: 6 (ref 5.0–8.0)

## 2021-07-16 LAB — GROUP A STREP BY PCR: Group A Strep by PCR: NOT DETECTED

## 2021-07-16 LAB — APTT: aPTT: 24 seconds (ref 24–36)

## 2021-07-16 LAB — COMPREHENSIVE METABOLIC PANEL
ALT: 36 U/L (ref 0–44)
AST: 30 U/L (ref 15–41)
Albumin: 3.6 g/dL (ref 3.5–5.0)
Alkaline Phosphatase: 90 U/L (ref 38–126)
Anion gap: 7 (ref 5–15)
BUN: 12 mg/dL (ref 6–20)
CO2: 29 mmol/L (ref 22–32)
Calcium: 9.1 mg/dL (ref 8.9–10.3)
Chloride: 99 mmol/L (ref 98–111)
Creatinine, Ser: 1.05 mg/dL — ABNORMAL HIGH (ref 0.44–1.00)
GFR, Estimated: >60 mL/min (ref >60)
Glucose, Bld: 91 mg/dL (ref 70–99)
Potassium: 3.4 mmol/L — ABNORMAL LOW (ref 3.5–5.1)
Sodium: 135 mmol/L (ref 135–145)
Total Bilirubin: 0.4 mg/dL (ref 0.3–1.2)
Total Protein: 7.3 g/dL (ref 6.5–8.1)

## 2021-07-16 LAB — CBC WITH DIFFERENTIAL/PLATELET
Abs Immature Granulocytes: 0.03 10*3/uL (ref 0.00–0.07)
Basophils Absolute: 0 10*3/uL (ref 0.0–0.1)
Basophils Relative: 0 %
Eosinophils Absolute: 0.1 10*3/uL (ref 0.0–0.5)
Eosinophils Relative: 1 %
HCT: 38.5 % (ref 36.0–46.0)
Hemoglobin: 12.7 g/dL (ref 12.0–15.0)
Immature Granulocytes: 0 %
Lymphocytes Relative: 6 %
Lymphs Abs: 0.6 10*3/uL — ABNORMAL LOW (ref 0.7–4.0)
MCH: 28.8 pg (ref 26.0–34.0)
MCHC: 33 g/dL (ref 30.0–36.0)
MCV: 87.3 fL (ref 80.0–100.0)
Monocytes Absolute: 0.6 10*3/uL (ref 0.1–1.0)
Monocytes Relative: 7 %
Neutro Abs: 8.4 10*3/uL — ABNORMAL HIGH (ref 1.7–7.7)
Neutrophils Relative %: 86 %
Platelets: 213 10*3/uL (ref 150–400)
RBC: 4.41 MIL/uL (ref 3.87–5.11)
RDW: 13.6 % (ref 11.5–15.5)
WBC: 9.8 10*3/uL (ref 4.0–10.5)
nRBC: 0 % (ref 0.0–0.2)

## 2021-07-16 LAB — RESP PANEL BY RT-PCR (FLU A&B, COVID) ARPGX2
Influenza A by PCR: NEGATIVE
Influenza B by PCR: NEGATIVE
SARS Coronavirus 2 by RT PCR: NEGATIVE

## 2021-07-16 LAB — LACTIC ACID, PLASMA
Lactic Acid, Venous: 0.9 mmol/L (ref 0.5–1.9)
Lactic Acid, Venous: 1.1 mmol/L (ref 0.5–1.9)

## 2021-07-16 LAB — PROTIME-INR
INR: 1 (ref 0.8–1.2)
Prothrombin Time: 13.2 seconds (ref 11.4–15.2)

## 2021-07-16 MED ORDER — IBUPROFEN 400 MG PO TABS
600.0000 mg | ORAL_TABLET | Freq: Once | ORAL | Status: AC
  Administered 2021-07-16: 600 mg via ORAL
  Filled 2021-07-16: qty 1

## 2021-07-16 MED ORDER — METOCLOPRAMIDE HCL 5 MG/ML IJ SOLN
10.0000 mg | Freq: Once | INTRAMUSCULAR | Status: AC
  Administered 2021-07-16: 10 mg via INTRAVENOUS
  Filled 2021-07-16: qty 2

## 2021-07-16 MED ORDER — SODIUM CHLORIDE 0.9 % IV BOLUS
500.0000 mL | Freq: Once | INTRAVENOUS | Status: AC
  Administered 2021-07-16: 500 mL via INTRAVENOUS

## 2021-07-16 MED ORDER — ACETAMINOPHEN 325 MG PO TABS
650.0000 mg | ORAL_TABLET | Freq: Once | ORAL | Status: AC | PRN
  Administered 2021-07-16: 650 mg via ORAL
  Filled 2021-07-16: qty 2

## 2021-07-16 MED ORDER — DIPHENHYDRAMINE HCL 50 MG/ML IJ SOLN
25.0000 mg | Freq: Once | INTRAMUSCULAR | Status: AC
  Administered 2021-07-16: 25 mg via INTRAVENOUS
  Filled 2021-07-16: qty 1

## 2021-07-16 NOTE — ED Triage Notes (Signed)
Pt reports headache since yesterday with pain in her joints. States she "feels bad". Had a cardiac cath this week and was told she "didn't have "blood flow to the right side of her heart". Reports chest pain with with deep breath

## 2021-07-16 NOTE — ED Provider Notes (Signed)
[facility] EMERGENCY DEPARTMENT Provider Note   CSN: [id number] Arrival date & time: 07/16/21  1635     History Chief Complaint  Patient presents with   Headache    Alicia Cook is a 55 y.o. female.  Patient with history of nonischemic cardiomyopathy with ICD implantation, history of sarcoidosis on methotrexate, diabetes --presents the emergency department for evaluation of headache, joint pains and fever.  Symptoms started yesterday and worsened today.  She has been having chest pains, worse with deep breathing, over the past several weeks.  She was found to have pericardial effusion and had a catheterization done several days ago which showed normal coronaries and compensated heart failure.  Patient denies any ear pain, runny nose, sore throat.  No shortness of breath or cough.  No nausea, vomiting, diarrhea or abdominal pain. No hematuria or irritative UTI symptoms including dysuria, increased frequency or urgency.  No treatments prior to arrival.  Right radial artery puncture site from recent catheterization without swelling or pain.      Past Medical History:  Diagnosis Date   Cataract    CHF (congestive heart failure) (HCC)    Depression    Diabetes mellitus without complication (HCC)    Encounter for adjustment of biventricular implantable cardioverter-defibrillator (ICD) 06/13/2019   Hypertension    ICD: Cardiac defibrillator in situ 01/13/2015   [employer] Bi-V ICD (Dynagen X4 CRT-D) 01/13/2015 at [facility] - MRI Compatible  Scheduled Remote ICD check 4.22.20: 1 SVT/8 secs, normal function. Battery longevity is 8 years. RA pacing is 1 %, RV pacing is 99 %, and LV pacing is 99 %.   Nonischemic cardiomyopathy (HCC)    a. EF 35-40% by cath in 2011 with normal cors b. s/p ICD implantation in 12/2014 c. EF 25% by NST in 02/01/2016   Sarcoidosis    Sarcoidosis 02/08/2016    Patient Active Problem List   Diagnosis Date Noted   Encounter for adjustment  of biventricular implantable cardioverter-defibrillator (ICD) 06/13/2019   Palpitations 12/05/2018   OSA (obstructive sleep apnea) 11/04/2018   Lung disease 01/17/2017   RLS (restless legs syndrome) 01/17/2017   Gastroesophageal reflux disease without esophagitis 11/30/2016   Chest pain 02/08/2016   Sarcoidosis 02/08/2016   Benign essential HTN 02/08/2016   Nonischemic cardiomyopathy (HCC) 02/08/2016   DM (diabetes mellitus), type 2 (HCC) 02/08/2016   Chronic systolic CHF (congestive heart failure) (HCC) 02/08/2016   Acquired hypothyroidism 09/02/2015   Heart failure with reduced left ventricular function (HCC) 09/02/2015   Hyperlipidemia 09/02/2015   PSVT (paroxysmal supraventricular tachycardia) (HCC) 09/02/2015   ICD: [employer] Bi-V ICD (Dynagen X4 CRT-D) MRI compatible ICD in situ 01/13/2015    Past Surgical History:  Procedure Laterality Date   ABDOMINAL SURGERY  12/20/2020    laparoscopic sleeve gastrectomy   CARDIAC CATHETERIZATION N/A 02/08/2016   Procedure: Left Heart Cath and Coronary Angiography;  Surgeon: Corky Crafts, MD;  Location: [facility] INVASIVE CV LAB;  Service: Cardiovascular;  Laterality: N/A;   CATARACT EXTRACTION Right 12/04/2018   CESAREAN SECTION     CHOLECYSTECTOMY     INSERTION OF ICD     a. s/p dual-chamber [employer] ICD 12/2014   LAPAROSCOPIC GASTRIC SLEEVE RESECTION     PACEMAKER INSERTION     RIGHT/LEFT HEART CATH AND CORONARY ANGIOGRAPHY N/A 07/12/2021   Procedure: RIGHT/LEFT HEART CATH AND CORONARY ANGIOGRAPHY;  Surgeon: Elder Negus, MD;  Location: [facility] INVASIVE CV LAB;  Service: Cardiovascular;  Laterality: N/A;   TUBAL LIGATION  OB History   No obstetric history on file.     Family History  Problem Relation Age of Onset   Heart attack Father        a. Initial onset in his 6's. S/p CABG   Heart failure Father     Social History   Tobacco Use   Smoking status: Former    Packs/day: 1.00    Years: 20.00     Pack years: 20.00    Types: Cigarettes    Quit date: 2016    Years since quitting: 6.7   Smokeless tobacco: Never  Vaping Use   Vaping Use: Never used  Substance Use Topics   Alcohol use: No    Alcohol/week: 0.0 standard drinks   Drug use: No    Home Medications Prior to Admission medications   Medication Sig Start Date End Date Taking? Authorizing Provider  acetaminophen (TYLENOL) 500 MG tablet Take 1,000 mg by mouth every 6 (six) hours as needed for moderate pain or headache.    [provider]  BIOTIN PO Take 1 capsule by mouth daily.    [provider]  buPROPion (WELLBUTRIN XL) 300 MG 24 hr tablet Take 300 mg by mouth daily.    [provider]  Ferrous Sulfate (IRON PO) Take 1 tablet by mouth daily.    [provider]  folic acid (FOLVITE) 1 MG tablet Take 3 mg by mouth daily.     [provider]  hydrOXYzine (VISTARIL) 25 MG capsule Take 25 mg by mouth 2 (two) times daily. 11/26/20   [provider]  ivabradine (CORLANOR) 7.5 MG TABS tablet Take 1 tablet (7.5 mg total) by mouth 2 (two) times daily with a meal. 04/20/21   Cantwell, Celeste C, PA-C  JARDIANCE 25 MG TABS tablet Take 25 mg by mouth daily. 02/14/21   [provider]  magnesium hydroxide (MILK OF MAGNESIA) 400 MG/5ML suspension Take 30 mLs by mouth daily as needed for mild constipation.    [provider]  methotrexate (RHEUMATREX) 2.5 MG tablet Take 20 mg by mouth every Sunday. Caution:Chemotherapy. Protect from light.    [provider]  metoprolol succinate (TOPROL-XL) 25 MG 24 hr tablet Take 1 tablet (25 mg total) by mouth daily. Take with or immediately following a meal. 04/20/21 04/15/22  Cantwell, Celeste C, PA-C  Multiple Vitamin (MULTIVITAMIN WITH MINERALS) TABS tablet Take 1 tablet by mouth daily.    [provider]  pantoprazole (PROTONIX) 40 MG tablet Take 1 tablet by mouth daily as needed (acid reflux). 01/23/20 07/11/21   [provider]  potassium chloride 20 MEQ TBCR Take 20 mEq by mouth daily. 06/14/21   Cantwell, Celeste C, PA-C  sacubitril-valsartan (ENTRESTO) 24-26 MG Take 1 tablet by mouth 2 (two) times daily. 06/10/21   Cantwell, Celeste C, PA-C  Semaglutide, 2 MG/DOSE, (OZEMPIC, 2 MG/DOSE,) 8 MG/3ML SOPN Inject 2 mg into the skin every Sunday.    [provider]  sertraline (ZOLOFT) 100 MG tablet Take 150 mg by mouth daily.    [provider]  torsemide (DEMADEX) 20 MG tablet Take 1 tablet (20 mg total) by mouth daily. 06/27/21 06/22/22  Cantwell, Celeste C, PA-C  vitamin B-12 (CYANOCOBALAMIN) 500 MCG tablet Take 500 mcg by mouth daily.    [provider]  Vitamin D, Ergocalciferol, (DRISDOL) 1.25 MG (50000 UT) CAPS capsule Take 50,000 Units by mouth every Tuesday. 09/20/18   [provider]    Allergies    Patient has  no known allergies.  Review of Systems   Review of Systems  Constitutional:  Positive for fever.  HENT:  Negative for rhinorrhea and sore throat.   Eyes:  Negative for redness.  Respiratory:  Negative for cough and shortness of breath.   Cardiovascular:  Positive for chest pain.  Gastrointestinal:  Negative for abdominal pain, diarrhea, nausea and vomiting.  Genitourinary:  Negative for dysuria, frequency, hematuria and urgency.  Musculoskeletal:  Negative for myalgias.  Skin:  Negative for rash.  Neurological:  Positive for headaches.   Physical Exam Updated Vital Signs BP 108/75 (BP Location: Left Arm)   Pulse (!) 116   Temp (!) 101.8 F (38.8 C) (Oral)   Resp 16   Ht 5\' 7"  (1.702 m)   Wt 101.6 kg   LMP 04/17/2016   SpO2 100%   BMI 35.08 kg/m   Physical Exam Vitals and nursing note reviewed.  Constitutional:      General: She is not in acute distress.    Appearance: She is well-developed.  HENT:     Head: Normocephalic and atraumatic.     Right Ear: External ear normal.     Left Ear: External ear normal.     Nose: Nose  normal.     Mouth/Throat:     Mouth: Mucous membranes are moist.  Eyes:     Conjunctiva/sclera: Conjunctivae normal.  Neck:     Comments: Moves neck freely, no signs of meningismus.  Cardiovascular:     Rate and Rhythm: Regular rhythm. Tachycardia present.     Heart sounds: No murmur heard. Pulmonary:     Effort: No respiratory distress.     Breath sounds: No wheezing, rhonchi or rales.  Abdominal:     Palpations: Abdomen is soft.     Tenderness: There is no abdominal tenderness. There is no guarding or rebound.  Musculoskeletal:     Cervical back: Normal range of motion and neck supple.     Right lower leg: No edema.     Left lower leg: No edema.  Skin:    General: Skin is warm and dry.     Findings: No rash.  Neurological:     General: No focal deficit present.     Mental Status: She is alert. Mental status is at baseline.     Motor: No weakness.  Psychiatric:        Mood and Affect: Mood normal.    ED Results / Procedures / Treatments   Labs (all labs ordered are listed, but only abnormal results are displayed) Labs Reviewed  COMPREHENSIVE METABOLIC PANEL - Abnormal; Notable for the following components:      Result Value   Potassium 3.4 (*)    Creatinine, Ser 1.05 (*)    All other components within normal limits  CBC WITH DIFFERENTIAL/PLATELET - Abnormal; Notable for the following components:   Neutro Abs 8.4 (*)    Lymphs Abs 0.6 (*)    All other components within normal limits  URINALYSIS, ROUTINE W REFLEX MICROSCOPIC - Abnormal; Notable for the following components:   Glucose, UA >=500 (*)    All other components within normal limits  URINALYSIS, MICROSCOPIC (REFLEX) - Abnormal; Notable for the following components:   Bacteria, UA MANY (*)    All other components within normal limits  RESP PANEL BY RT-PCR (FLU A&B, COVID) ARPGX2  GROUP A STREP BY PCR  URINE CULTURE  CULTURE, BLOOD (ROUTINE X 2)  CULTURE, BLOOD (ROUTINE X 2)  LACTIC ACID, PLASMA  PROTIME-INR  APTT  LACTIC ACID, PLASMA    EKG EKG Interpretation  Date/Time:  Saturday July 16 2021 16:54:28 EDT Ventricular Rate:  112 PR Interval:  101 QRS Duration: 221 QT Interval:  439 QTC Calculation: 600 R Axis:   -82 Text Interpretation: Sinus or ectopic atrial tachycardia Right bundle branch block LVH with secondary repolarization abnormality No significant change since prior 9/22 Confirmed by Meridee Score [id number]) on 07/16/2021 4:56:23 PM  Radiology DG Chest Port 1 View  Result Date: 07/16/2021 CLINICAL DATA:  Questionable sepsis. Feels bad, joint pain, headache. EXAM: PORTABLE CHEST 1 VIEW COMPARISON:  Chest x-ray 06/08/2021, CT angiography chest 11/18/2020 FINDINGS: Persistent cardiomegaly. The heart and mediastinal contours are unchanged. Aortic calcifications. Left cardiac pacemaker/defibrillator with two leads in similar position. No focal consolidation. No pulmonary edema. No pleural effusion. No pneumothorax. No acute osseous abnormality. IMPRESSION: No active disease. Electronically Signed   By: Tish Frederickson M.D.   On: 07/16/2021 17:53    Procedures Procedures   Medications Ordered in ED Medications  acetaminophen (TYLENOL) tablet 650 mg (650 mg Oral Given 07/16/21 1659)    ED Course  I have reviewed the triage vital signs and the nursing notes.  Pertinent labs & imaging results that were available during my care of the patient were reviewed by me and considered in my medical decision making (see chart for details).  Patient seen and examined. Work-up initiated.  Patient appears nontoxic.  She is on immunosuppressive therapy.  Vital signs reviewed and are as follows: BP 108/75 (BP Location: Left Arm)   Pulse (!) 116   Temp (!) 101.8 F (38.8 C) (Oral)   Resp 16   Ht 5\' 7"  (1.702 m)   Wt 101.6 kg   LMP 04/17/2016   SpO2 100%   BMI 35.08 kg/m   Septic work-up overall is reassuring.  Unfortunately, no identifiable cause or source of the  patient's fever at this point.  Cultures are sent and pending.  Patient has been persistently hypotensive into the 80-90 systolic range.  She was given a 500 cc fluid bolus and an additional fluid bolus was ordered.  COVID testing negative.  Orthostatic vital signs were checked.  Patient reports dizziness with sitting and standing.  Blood pressure actually was 79 systolic with sitting.  Tachycardia improved with antipyretics however patient is still hypotensive with orthostasis.  Lactic acid rechecked 0.9 > 1.1.  Given persistent hypotension, plan for admission for observation.  Results reviewed with patient at bedside.  She is comfortable with this plan.  I spoke with Dr. 04/19/2016 who will place admission orders.   Clinical Course as of 07/16/21 2306  Sat Jul 16, 2021  973 55 year old female here with headache body aches and feeling dizzy.  Had a temperature on arrival.'s been tachycardic although improving.  Blood pressures remained soft.  She is awake and alert.  Infectious work-up has been fairly unremarkable so far.  Disposition per results of testing [MB]    Clinical Course User Index [MB] 57, MD   MDM Rules/Calculators/A&P                           Patient with hypotension worse with standing in setting of febrile illness, unclear etiology, on immunocompromising medications for sarcoidosis.  She does have history of CHF and therefore aggressive fluid resuscitation is complicated.  However patient does not appear to be in septic shock.  Her lactic acid is normal.  She  is mentating well and looks fairly well.  Plan as above.   Final Clinical Impression(s) / ED Diagnoses Final diagnoses:  Hypotension, unspecified hypotension type  Febrile illness  Orthostatic hypotension    Rx / DC Orders ED Discharge Orders     None        Renne Crigler, PA-C 07/16/21 2310    Terrilee Files, MD 07/17/21 1022

## 2021-07-16 NOTE — ED Notes (Signed)
ED Provider at bedside. 

## 2021-07-17 ENCOUNTER — Inpatient Hospital Stay (HOSPITAL_COMMUNITY): Payer: Medicaid Other

## 2021-07-17 ENCOUNTER — Encounter (HOSPITAL_COMMUNITY): Payer: Self-pay | Admitting: Internal Medicine

## 2021-07-17 DIAGNOSIS — I313 Pericardial effusion (noninflammatory): Secondary | ICD-10-CM | POA: Diagnosis present

## 2021-07-17 DIAGNOSIS — Z79899 Other long term (current) drug therapy: Secondary | ICD-10-CM | POA: Diagnosis not present

## 2021-07-17 DIAGNOSIS — I4510 Unspecified right bundle-branch block: Secondary | ICD-10-CM | POA: Diagnosis present

## 2021-07-17 DIAGNOSIS — I959 Hypotension, unspecified: Secondary | ICD-10-CM | POA: Diagnosis not present

## 2021-07-17 DIAGNOSIS — R079 Chest pain, unspecified: Secondary | ICD-10-CM

## 2021-07-17 DIAGNOSIS — I5022 Chronic systolic (congestive) heart failure: Secondary | ICD-10-CM | POA: Diagnosis present

## 2021-07-17 DIAGNOSIS — Z9049 Acquired absence of other specified parts of digestive tract: Secondary | ICD-10-CM | POA: Diagnosis not present

## 2021-07-17 DIAGNOSIS — G4733 Obstructive sleep apnea (adult) (pediatric): Secondary | ICD-10-CM | POA: Diagnosis present

## 2021-07-17 DIAGNOSIS — Z8249 Family history of ischemic heart disease and other diseases of the circulatory system: Secondary | ICD-10-CM | POA: Diagnosis not present

## 2021-07-17 DIAGNOSIS — E876 Hypokalemia: Secondary | ICD-10-CM | POA: Diagnosis present

## 2021-07-17 DIAGNOSIS — D869 Sarcoidosis, unspecified: Secondary | ICD-10-CM | POA: Diagnosis present

## 2021-07-17 DIAGNOSIS — Z95810 Presence of automatic (implantable) cardiac defibrillator: Secondary | ICD-10-CM | POA: Diagnosis not present

## 2021-07-17 DIAGNOSIS — Z9841 Cataract extraction status, right eye: Secondary | ICD-10-CM | POA: Diagnosis not present

## 2021-07-17 DIAGNOSIS — I951 Orthostatic hypotension: Secondary | ICD-10-CM | POA: Diagnosis present

## 2021-07-17 DIAGNOSIS — I420 Dilated cardiomyopathy: Secondary | ICD-10-CM | POA: Diagnosis present

## 2021-07-17 DIAGNOSIS — F419 Anxiety disorder, unspecified: Secondary | ICD-10-CM | POA: Diagnosis present

## 2021-07-17 DIAGNOSIS — I272 Pulmonary hypertension, unspecified: Secondary | ICD-10-CM | POA: Diagnosis present

## 2021-07-17 DIAGNOSIS — Z6835 Body mass index (BMI) 35.0-35.9, adult: Secondary | ICD-10-CM | POA: Diagnosis not present

## 2021-07-17 DIAGNOSIS — R6510 Systemic inflammatory response syndrome (SIRS) of non-infectious origin without acute organ dysfunction: Secondary | ICD-10-CM | POA: Diagnosis present

## 2021-07-17 DIAGNOSIS — Z20822 Contact with and (suspected) exposure to covid-19: Secondary | ICD-10-CM | POA: Diagnosis present

## 2021-07-17 DIAGNOSIS — Z87891 Personal history of nicotine dependence: Secondary | ICD-10-CM | POA: Diagnosis not present

## 2021-07-17 DIAGNOSIS — E119 Type 2 diabetes mellitus without complications: Secondary | ICD-10-CM | POA: Diagnosis present

## 2021-07-17 DIAGNOSIS — A020 Salmonella enteritis: Secondary | ICD-10-CM | POA: Diagnosis present

## 2021-07-17 DIAGNOSIS — I110 Hypertensive heart disease with heart failure: Secondary | ICD-10-CM | POA: Diagnosis present

## 2021-07-17 DIAGNOSIS — F32A Depression, unspecified: Secondary | ICD-10-CM | POA: Diagnosis present

## 2021-07-17 LAB — CBC WITH DIFFERENTIAL/PLATELET
Abs Immature Granulocytes: 0.01 10*3/uL (ref 0.00–0.07)
Basophils Absolute: 0 10*3/uL (ref 0.0–0.1)
Basophils Relative: 0 %
Eosinophils Absolute: 0 10*3/uL (ref 0.0–0.5)
Eosinophils Relative: 1 %
HCT: 40.2 % (ref 36.0–46.0)
Hemoglobin: 13.1 g/dL (ref 12.0–15.0)
Immature Granulocytes: 0 %
Lymphocytes Relative: 10 %
Lymphs Abs: 0.7 10*3/uL (ref 0.7–4.0)
MCH: 28.5 pg (ref 26.0–34.0)
MCHC: 32.6 g/dL (ref 30.0–36.0)
MCV: 87.6 fL (ref 80.0–100.0)
Monocytes Absolute: 0.5 10*3/uL (ref 0.1–1.0)
Monocytes Relative: 7 %
Neutro Abs: 5.8 10*3/uL (ref 1.7–7.7)
Neutrophils Relative %: 82 %
Platelets: 206 10*3/uL (ref 150–400)
RBC: 4.59 MIL/uL (ref 3.87–5.11)
RDW: 13.6 % (ref 11.5–15.5)
WBC: 7.1 10*3/uL (ref 4.0–10.5)
nRBC: 0 % (ref 0.0–0.2)

## 2021-07-17 LAB — COMPREHENSIVE METABOLIC PANEL
ALT: 30 U/L (ref 0–44)
AST: 26 U/L (ref 15–41)
Albumin: 3.5 g/dL (ref 3.5–5.0)
Alkaline Phosphatase: 84 U/L (ref 38–126)
Anion gap: 7 (ref 5–15)
BUN: 13 mg/dL (ref 6–20)
CO2: 25 mmol/L (ref 22–32)
Calcium: 8.9 mg/dL (ref 8.9–10.3)
Chloride: 105 mmol/L (ref 98–111)
Creatinine, Ser: 0.67 mg/dL (ref 0.44–1.00)
GFR, Estimated: >60 mL/min (ref >60)
Glucose, Bld: 103 mg/dL — ABNORMAL HIGH (ref 70–99)
Potassium: 3.3 mmol/L — ABNORMAL LOW (ref 3.5–5.1)
Sodium: 137 mmol/L (ref 135–145)
Total Bilirubin: 0.6 mg/dL (ref 0.3–1.2)
Total Protein: 6.8 g/dL (ref 6.5–8.1)

## 2021-07-17 LAB — GLUCOSE, CAPILLARY
Glucose-Capillary: 121 mg/dL — ABNORMAL HIGH (ref 70–99)
Glucose-Capillary: 115 mg/dL — ABNORMAL HIGH (ref 70–99)

## 2021-07-17 LAB — D-DIMER, QUANTITATIVE: D-Dimer, Quant: 5.17 ug/mL-FEU — ABNORMAL HIGH (ref 0.00–0.50)

## 2021-07-17 LAB — HEMOGLOBIN A1C
Hgb A1c MFr Bld: 6 % — ABNORMAL HIGH (ref 4.8–5.6)
Mean Plasma Glucose: 125.5 mg/dL

## 2021-07-17 LAB — TROPONIN I (HIGH SENSITIVITY)
Troponin I (High Sensitivity): 17 ng/L (ref <18)
Troponin I (High Sensitivity): 18 ng/L — ABNORMAL HIGH (ref <18)

## 2021-07-17 LAB — MAGNESIUM: Magnesium: 2.2 mg/dL (ref 1.7–2.4)

## 2021-07-17 LAB — HIV ANTIBODY (ROUTINE TESTING W REFLEX): HIV Screen 4th Generation wRfx: NONREACTIVE

## 2021-07-17 MED ORDER — ACETAMINOPHEN 325 MG PO TABS
650.0000 mg | ORAL_TABLET | Freq: Four times a day (QID) | ORAL | Status: DC | PRN
  Administered 2021-07-19: 650 mg via ORAL
  Filled 2021-07-17: qty 2

## 2021-07-17 MED ORDER — SACUBITRIL-VALSARTAN 24-26 MG PO TABS: 1.0000 | ORAL_TABLET | Freq: Two times a day (BID) | ORAL | Status: DC

## 2021-07-17 MED ORDER — IOHEXOL 350 MG/ML SOLN
80.0000 mL | Freq: Once | INTRAVENOUS | Status: AC | PRN
  Administered 2021-07-17: 80 mL via INTRAVENOUS

## 2021-07-17 MED ORDER — IVABRADINE HCL 5 MG PO TABS
7.5000 mg | ORAL_TABLET | Freq: Two times a day (BID) | ORAL | Status: DC
  Administered 2021-07-18 – 2021-07-19 (×2): 7.5 mg via ORAL
  Filled 2021-07-17: qty 2
  Filled 2021-07-17: qty 1
  Filled 2021-07-17: qty 2
  Filled 2021-07-17: qty 1
  Filled 2021-07-17: qty 2
  Filled 2021-07-17: qty 1

## 2021-07-17 MED ORDER — SERTRALINE HCL 50 MG PO TABS
150.0000 mg | ORAL_TABLET | Freq: Every day | ORAL | Status: DC
  Administered 2021-07-17 – 2021-07-19 (×3): 150 mg via ORAL
  Filled 2021-07-17 (×3): qty 1

## 2021-07-17 MED ORDER — INSULIN ASPART 100 UNIT/ML IJ SOLN: 0.0000 [IU] | Freq: Three times a day (TID) | INTRAMUSCULAR | Status: DC

## 2021-07-17 MED ORDER — HYDROCODONE-ACETAMINOPHEN 5-325 MG PO TABS
1.0000 | ORAL_TABLET | ORAL | Status: DC | PRN
  Administered 2021-07-17 (×2): 2 via ORAL
  Filled 2021-07-17 (×2): qty 2

## 2021-07-17 MED ORDER — ENOXAPARIN SODIUM 60 MG/0.6ML IJ SOSY
50.0000 mg | PREFILLED_SYRINGE | INTRAMUSCULAR | Status: DC
  Administered 2021-07-17 – 2021-07-18 (×2): 50 mg via SUBCUTANEOUS
  Filled 2021-07-17 (×2): qty 0.6

## 2021-07-17 MED ORDER — ACETAMINOPHEN 500 MG PO TABS
ORAL_TABLET | ORAL | Status: AC
  Filled 2021-07-17: qty 2

## 2021-07-17 MED ORDER — ONDANSETRON HCL 4 MG/2ML IJ SOLN: 4.0000 mg | Freq: Four times a day (QID) | INTRAMUSCULAR | Status: DC | PRN

## 2021-07-17 MED ORDER — INSULIN ASPART 100 UNIT/ML IJ SOLN: 0.0000 [IU] | Freq: Every day | INTRAMUSCULAR | Status: DC

## 2021-07-17 MED ORDER — ACETAMINOPHEN 650 MG RE SUPP: 650.0000 mg | Freq: Four times a day (QID) | RECTAL | Status: DC | PRN

## 2021-07-17 MED ORDER — BUPROPION HCL ER (XL) 300 MG PO TB24
300.0000 mg | ORAL_TABLET | Freq: Every day | ORAL | Status: DC
  Administered 2021-07-17 – 2021-07-19 (×3): 300 mg via ORAL
  Filled 2021-07-17 (×3): qty 1

## 2021-07-17 MED ORDER — ONDANSETRON HCL 4 MG PO TABS: 4.0000 mg | ORAL_TABLET | Freq: Four times a day (QID) | ORAL | Status: DC | PRN

## 2021-07-17 MED ORDER — EMPAGLIFLOZIN 25 MG PO TABS: 25.0000 mg | ORAL_TABLET | Freq: Every day | ORAL | Status: DC

## 2021-07-17 MED ORDER — SEMAGLUTIDE(0.25 OR 0.5MG/DOS) 2 MG/1.5ML ~~LOC~~ SOPN: 2.0000 mg | PEN_INJECTOR | SUBCUTANEOUS | Status: DC

## 2021-07-17 MED ORDER — PANTOPRAZOLE SODIUM 40 MG PO TBEC
40.0000 mg | DELAYED_RELEASE_TABLET | Freq: Every day | ORAL | Status: DC | PRN
  Filled 2021-07-17: qty 1

## 2021-07-17 MED ORDER — POTASSIUM CHLORIDE CRYS ER 20 MEQ PO TBCR
40.0000 meq | EXTENDED_RELEASE_TABLET | Freq: Two times a day (BID) | ORAL | Status: AC
  Administered 2021-07-17 – 2021-07-18 (×2): 40 meq via ORAL
  Filled 2021-07-17 (×2): qty 2

## 2021-07-17 MED ORDER — ACETAMINOPHEN 500 MG PO TABS
1000.0000 mg | ORAL_TABLET | Freq: Once | ORAL | Status: AC
  Administered 2021-07-17: 1000 mg via ORAL
  Filled 2021-07-17: qty 2

## 2021-07-17 NOTE — H&P (Signed)
History and Physical    Alicia Cook IHK:[id number] DOB: September 20, 1966 DOA: 07/16/2021  PCP: Spero Geralds, MD  Patient coming from: Home  Chief Complaint: Headache, neck pain  HPI: Alicia Cook is a 55 y.o. female with medical history significant of DM2, chronic systolic HF w/ ICD placement, GAD, sarcoidosis. Presenting with headache, body aches. She reports her symptoms started 3 or 4 days ago. She had a throbbing headache from the top of her head to the base of her neck. It came in waves. OTC pain relief did not help. She has also felt fatigue and dizziness with movement. She noticed that her BP was low at home. She became concerned when her symptoms did not improve yesterday, so she came to the ED yesterday afternoon for help. She denies any other aggravating or alleviating factors.   ED Course: She was febrile in the ED. Her K+ was low. She was COVID negative. CXR was negative.   Review of Systems: Denies palpitations, N/V/D. Reports central chest pain that is worse with breathing, dizziness, dyspnea. Review of systems is otherwise negative for all not mentioned in HPI.   PMHx Past Medical History:  Diagnosis Date   Cataract    CHF (congestive heart failure) (HCC)    Depression    Diabetes mellitus without complication (HCC)    Encounter for adjustment of biventricular implantable cardioverter-defibrillator (ICD) 06/13/2019   Hypertension    ICD: Cardiac defibrillator in situ 01/13/2015   [employer] Bi-V ICD (Dynagen X4 CRT-D) 01/13/2015 at [facility] - MRI Compatible  Scheduled Remote ICD check 4.22.20: 1 SVT/8 secs, normal function. Battery longevity is 8 years. RA pacing is 1 %, RV pacing is 99 %, and LV pacing is 99 %.   Nonischemic cardiomyopathy (HCC)    a. EF 35-40% by cath in 2011 with normal cors b. s/p ICD implantation in 12/2014 c. EF 25% by NST in 02/01/2016   Sarcoidosis    Sarcoidosis 02/08/2016    PSHx Past Surgical History:  Procedure  Laterality Date   ABDOMINAL SURGERY  12/20/2020    laparoscopic sleeve gastrectomy   CARDIAC CATHETERIZATION N/A 02/08/2016   Procedure: Left Heart Cath and Coronary Angiography;  Surgeon: Corky Crafts, MD;  Location: [facility] INVASIVE CV LAB;  Service: Cardiovascular;  Laterality: N/A;   CATARACT EXTRACTION Right 12/04/2018   CESAREAN SECTION     CHOLECYSTECTOMY     INSERTION OF ICD     a. s/p dual-chamber [employer] ICD 12/2014   LAPAROSCOPIC GASTRIC SLEEVE RESECTION     PACEMAKER INSERTION     RIGHT/LEFT HEART CATH AND CORONARY ANGIOGRAPHY N/A 07/12/2021   Procedure: RIGHT/LEFT HEART CATH AND CORONARY ANGIOGRAPHY;  Surgeon: Elder Negus, MD;  Location: [facility] INVASIVE CV LAB;  Service: Cardiovascular;  Laterality: N/A;   TUBAL LIGATION      SocHx  reports that she quit smoking about 6 years ago. Her smoking use included cigarettes. She has a 20.00 pack-year smoking history. She has never used smokeless tobacco. She reports that she does not drink alcohol and does not use drugs.  No Known Allergies  FamHx Family History  Problem Relation Age of Onset   Heart attack Father        a. Initial onset in his 34's. S/p CABG   Heart failure Father     Prior to Admission medications   Medication Sig Start Date End Date Taking? Authorizing Provider  acetaminophen (TYLENOL) 500 MG tablet Take 1,000 mg by mouth every 6 (six)  hours as needed for moderate pain or headache.   Yes [provider]  BIOTIN PO Take 1 capsule by mouth daily.   Yes [provider]  buPROPion (WELLBUTRIN XL) 300 MG 24 hr tablet Take 300 mg by mouth daily.   Yes [provider]  Ferrous Sulfate (IRON PO) Take 1 tablet by mouth daily.   Yes [provider]  folic acid (FOLVITE) 1 MG tablet Take 3 mg by mouth daily.    Yes [provider]  hydrOXYzine (VISTARIL) 25 MG capsule Take 25 mg by mouth 2 (two) times daily. 11/26/20  Yes [provider]   ivabradine (CORLANOR) 7.5 MG TABS tablet Take 1 tablet (7.5 mg total) by mouth 2 (two) times daily with a meal. 04/20/21  Yes Cantwell, Celeste C, PA-C  JARDIANCE 25 MG TABS tablet Take 25 mg by mouth daily. 02/14/21  Yes [provider]  magnesium hydroxide (MILK OF MAGNESIA) 400 MG/5ML suspension Take 30 mLs by mouth daily as needed for mild constipation.   Yes [provider]  methotrexate (RHEUMATREX) 2.5 MG tablet Take 20 mg by mouth every Sunday. Caution:Chemotherapy. Protect from light.   Yes [provider]  metoprolol succinate (TOPROL-XL) 25 MG 24 hr tablet Take 1 tablet (25 mg total) by mouth daily. Take with or immediately following a meal. 04/20/21 04/15/22 Yes Cantwell, Celeste C, PA-C  Multiple Vitamin (MULTIVITAMIN WITH MINERALS) TABS tablet Take 1 tablet by mouth daily.   Yes [provider]  potassium chloride 20 MEQ TBCR Take 20 mEq by mouth daily. 06/14/21  Yes Cantwell, Celeste C, PA-C  sacubitril-valsartan (ENTRESTO) 24-26 MG Take 1 tablet by mouth 2 (two) times daily. 06/10/21  Yes Cantwell, Celeste C, PA-C  Semaglutide, 2 MG/DOSE, (OZEMPIC, 2 MG/DOSE,) 8 MG/3ML SOPN Inject 2 mg into the skin every Sunday.   Yes [provider]  sertraline (ZOLOFT) 100 MG tablet Take 150 mg by mouth daily.   Yes [provider]  torsemide (DEMADEX) 20 MG tablet Take 1 tablet (20 mg total) by mouth daily. 06/27/21 06/22/22 Yes Cantwell, Celeste C, PA-C  vitamin B-12 (CYANOCOBALAMIN) 500 MCG tablet Take 500 mcg by mouth daily.   Yes [provider]  Vitamin D, Ergocalciferol, (DRISDOL) 1.25 MG (50000 UT) CAPS capsule Take 50,000 Units by mouth every Tuesday. 09/20/18  Yes [provider]  pantoprazole (PROTONIX) 40 MG tablet Take 1 tablet by mouth daily as needed (acid reflux). 01/23/20 07/11/21  [provider]    Physical Exam: Vitals:   07/17/21 0730 07/17/21 1204 07/17/21 1208 07/17/21 1325  BP: 107/76 (!) 95/53 (!)  95/53 112/76  Pulse: 97 (!) 105 (!) 108 (!) 102  Resp: 17 (!) 21 (!) 24 20  Temp:  (!) 100.7 F (38.2 C)  99.8 F (37.7 C)  TempSrc:  Oral  Oral  SpO2: 97% 98% 95% 100%  Weight:    101 kg  Height:    5\' 7"  (1.702 m)    General: 55 y.o. female resting in bed in NAD Eyes: PERRL, normal sclera ENMT: Nares patent w/o discharge, orophaynx clear, dentition normal, ears w/o discharge/lesions/ulcers Neck: Supple, trachea midline Cardiovascular: RRR, +S1, S2, no m/g/r, equal pulses throughout Respiratory: CTABL, no w/r/r, normal WOB GI: BS+, NDNT, no masses noted, no organomegaly noted MSK: No e/c/c Skin: No rashes, bruises, ulcerations noted Neuro: A&O x 3, no focal deficits Psyc: Appropriate interaction and affect, calm/cooperative  Labs on Admission: I have personally reviewed following labs and imaging studies  CBC: Recent Labs  Lab 07/12/21 1126 07/12/21 1127 07/16/21 1724  WBC  --   --  9.8  NEUTROABS  --   --  8.4*  HGB 13.3 12.9 12.7  HCT 39.0 38.0 38.5  MCV  --   --  87.3  PLT  --   --  213   Basic Metabolic Panel: Recent Labs  Lab 07/12/21 1126 07/12/21 1127 07/16/21 1724  NA 142 142 135  K 3.3* 3.2* 3.4*  CL  --   --  99  CO2  --   --  29  GLUCOSE  --   --  91  BUN  --   --  12  CREATININE  --   --  1.05*  CALCIUM  --   --  9.1   GFR: Estimated Creatinine Clearance: 74.8 mL/min (A) (by C-G formula based on SCr of 1.05 mg/dL (H)). Liver Function Tests: Recent Labs  Lab 07/16/21 1724  AST 30  ALT 36  ALKPHOS 90  BILITOT 0.4  PROT 7.3  ALBUMIN 3.6   No results for input(s): LIPASE, AMYLASE in the last 168 hours. No results for input(s): AMMONIA in the last 168 hours. Coagulation Profile: Recent Labs  Lab 07/16/21 1724  INR 1.0   Cardiac Enzymes: No results for input(s): CKTOTAL, CKMB, CKMBINDEX, TROPONINI in the last 168 hours. BNP (last 3 results) No results for input(s): PROBNP in the last 8760 hours. HbA1C: No results for input(s):  HGBA1C in the last 72 hours. CBG: Recent Labs  Lab 07/12/21 0818  GLUCAP 109*   Lipid Profile: No results for input(s): CHOL, HDL, LDLCALC, TRIG, CHOLHDL, LDLDIRECT in the last 72 hours. Thyroid Function Tests: No results for input(s): TSH, T4TOTAL, FREET4, T3FREE, THYROIDAB in the last 72 hours. Anemia Panel: No results for input(s): VITAMINB12, FOLATE, FERRITIN, TIBC, IRON, RETICCTPCT in the last 72 hours. Urine analysis:    Component Value Date/Time   COLORURINE YELLOW 07/16/2021 1724   APPEARANCEUR CLEAR 07/16/2021 1724   LABSPEC 1.015 07/16/2021 1724   PHURINE 6.0 07/16/2021 1724   GLUCOSEU >=500 (A) 07/16/2021 1724   HGBUR NEGATIVE 07/16/2021 1724   BILIRUBINUR NEGATIVE 07/16/2021 1724   KETONESUR NEGATIVE 07/16/2021 1724   PROTEINUR NEGATIVE 07/16/2021 1724   NITRITE NEGATIVE 07/16/2021 1724   LEUKOCYTESUR NEGATIVE 07/16/2021 1724    Radiological Exams on Admission: DG Chest Port 1 View  Result Date: 07/16/2021 CLINICAL DATA:  Questionable sepsis. Feels bad, joint pain, headache. EXAM: PORTABLE CHEST 1 VIEW COMPARISON:  Chest x-ray 06/08/2021, CT angiography chest 11/18/2020 FINDINGS: Persistent cardiomegaly. The heart and mediastinal contours are unchanged. Aortic calcifications. Left cardiac pacemaker/defibrillator with two leads in similar position. No focal consolidation. No pulmonary edema. No pleural effusion. No pneumothorax. No acute osseous abnormality. IMPRESSION: No active disease. Electronically Signed   By: Tish Frederickson M.D.   On: 07/16/2021 17:53    EKG: Pending  Assessment/Plan SIRS     - admit to inpt, progressive     - fevers and hypotension over the in ED, but no sources of infection     - no abx initiated, and agree with holding for now     - she states that she's had problems with dialing in her HF/BP meds recently; let's hold her entresto for now     - d-dimer is elevated; PTE could cause our hypotension and fevers; check CTA chest  Chronic  HFrEF     - per card's note 9/13: echo 06/24/21: EF 25%     -  recent [facility] showed non-ischemic cardiomyopathy     - resume home regimen when BP tolerates  Sarcodosis     - continue outpt follow up     - continue home regimen  Anxiety Depression     - continue home regimen  Hypokalemia     - add K+ 40mg  PO BID; Mg2+ ok  DM2     - SSI, A1c, DM diet, glucose checks  Headache     - reports as worst of her life     - PRN APAP, norco     - non-focal     - CTH  DVT prophylaxis: lovenox  Code Status: FULL  Family Communication: None at bedside  Consults called: None   Status is: Inpatient  Remains inpatient appropriate because:Inpatient level of care appropriate due to severity of illness  Dispo: The patient is from: Home              Anticipated d/c is to: Home              Patient currently is not medically stable to d/c.   Difficult to place patient No  Time spent coordinating admission: 45 minutes  Donjuan Robison A Hayk Divis DO [facility]  If 7PM-7AM, please contact night-coverage [email or web address]  07/17/2021, 1:35 PM

## 2021-07-17 NOTE — Plan of Care (Signed)

## 2021-07-17 NOTE — Progress Notes (Signed)
PHARMACIST-PROVIDER COMMUNICATION NOTE  Alicia Cook presented on 07/16/21 with headache and fever.  Patient is currently NPO.  Orders to resume PTA Jardiance.  Hold criteria for SGLT2 Inhibitors is as follows:  Acute renal failure eGFR < 45 mL/min/1.97m (canagliflozin INVOKANA, ertugliflozin STEGLATRO) eGFR < 30 mL/min/1.724m(empagliflozin JARDIANCE, dapagliflozin FARXIGA) Diabetic ketoacidosis Metabolic acidosis NPO status UTI Dehydration Volume depletion   Jardiance will be held for now given that patient is NPO.  MaDimple NanasPharmD 07/17/2021 1:24 PM

## 2021-07-18 DIAGNOSIS — D869 Sarcoidosis, unspecified: Secondary | ICD-10-CM

## 2021-07-18 DIAGNOSIS — F32A Depression, unspecified: Secondary | ICD-10-CM

## 2021-07-18 DIAGNOSIS — E876 Hypokalemia: Secondary | ICD-10-CM

## 2021-07-18 DIAGNOSIS — I3139 Other pericardial effusion (noninflammatory): Secondary | ICD-10-CM

## 2021-07-18 DIAGNOSIS — I5022 Chronic systolic (congestive) heart failure: Secondary | ICD-10-CM

## 2021-07-18 DIAGNOSIS — I428 Other cardiomyopathies: Secondary | ICD-10-CM

## 2021-07-18 DIAGNOSIS — I313 Pericardial effusion (noninflammatory): Secondary | ICD-10-CM

## 2021-07-18 DIAGNOSIS — R519 Headache, unspecified: Secondary | ICD-10-CM

## 2021-07-18 DIAGNOSIS — I959 Hypotension, unspecified: Secondary | ICD-10-CM

## 2021-07-18 DIAGNOSIS — R6510 Systemic inflammatory response syndrome (SIRS) of non-infectious origin without acute organ dysfunction: Secondary | ICD-10-CM | POA: Diagnosis present

## 2021-07-18 DIAGNOSIS — G4733 Obstructive sleep apnea (adult) (pediatric): Secondary | ICD-10-CM

## 2021-07-18 LAB — URINE CULTURE

## 2021-07-18 LAB — COMPREHENSIVE METABOLIC PANEL
ALT: 28 U/L (ref 0–44)
AST: 26 U/L (ref 15–41)
Albumin: 3.3 g/dL — ABNORMAL LOW (ref 3.5–5.0)
Alkaline Phosphatase: 78 U/L (ref 38–126)
Anion gap: 6 (ref 5–15)
BUN: 11 mg/dL (ref 6–20)
CO2: 26 mmol/L (ref 22–32)
Calcium: 8.9 mg/dL (ref 8.9–10.3)
Chloride: 105 mmol/L (ref 98–111)
Creatinine, Ser: 0.69 mg/dL (ref 0.44–1.00)
GFR, Estimated: >60 mL/min (ref >60)
Glucose, Bld: 83 mg/dL (ref 70–99)
Potassium: 3.9 mmol/L (ref 3.5–5.1)
Sodium: 137 mmol/L (ref 135–145)
Total Bilirubin: 0.4 mg/dL (ref 0.3–1.2)
Total Protein: 6.5 g/dL (ref 6.5–8.1)

## 2021-07-18 LAB — GLUCOSE, CAPILLARY
Glucose-Capillary: 103 mg/dL — ABNORMAL HIGH (ref 70–99)
Glucose-Capillary: 75 mg/dL (ref 70–99)
Glucose-Capillary: 84 mg/dL (ref 70–99)
Glucose-Capillary: 148 mg/dL — ABNORMAL HIGH (ref 70–99)

## 2021-07-18 LAB — C-REACTIVE PROTEIN: CRP: 8.6 mg/dL — ABNORMAL HIGH (ref <1.0)

## 2021-07-18 LAB — CBC
HCT: 40 % (ref 36.0–46.0)
Hemoglobin: 12.9 g/dL (ref 12.0–15.0)
MCH: 28.5 pg (ref 26.0–34.0)
MCHC: 32.3 g/dL (ref 30.0–36.0)
MCV: 88.3 fL (ref 80.0–100.0)
Platelets: 217 10*3/uL (ref 150–400)
RBC: 4.53 MIL/uL (ref 3.87–5.11)
RDW: 13.8 % (ref 11.5–15.5)
WBC: 6.4 10*3/uL (ref 4.0–10.5)
nRBC: 0 % (ref 0.0–0.2)

## 2021-07-18 LAB — BRAIN NATRIURETIC PEPTIDE: B Natriuretic Peptide: 514.6 pg/mL — ABNORMAL HIGH (ref 0.0–100.0)

## 2021-07-18 LAB — SEDIMENTATION RATE: Sed Rate: 30 mm/hr — ABNORMAL HIGH (ref 0–22)

## 2021-07-18 LAB — C DIFFICILE QUICK SCREEN W PCR REFLEX
C Diff antigen: NEGATIVE
C Diff interpretation: NOT DETECTED
C Diff toxin: NEGATIVE

## 2021-07-18 MED ORDER — LOPERAMIDE HCL 2 MG PO CAPS
2.0000 mg | ORAL_CAPSULE | ORAL | Status: DC | PRN
  Administered 2021-07-19: 2 mg via ORAL
  Filled 2021-07-18: qty 1

## 2021-07-18 MED ORDER — VITAMIN B-12 1000 MCG PO TABS
500.0000 ug | ORAL_TABLET | Freq: Every day | ORAL | Status: DC
  Administered 2021-07-18 – 2021-07-19 (×2): 500 ug via ORAL
  Filled 2021-07-18 (×2): qty 1

## 2021-07-18 MED ORDER — VITAMIN B-12 500 MCG PO TABS: 500.0000 ug | ORAL_TABLET | Freq: Every day | ORAL | Status: DC

## 2021-07-18 MED ORDER — MIDODRINE HCL 5 MG PO TABS
10.0000 mg | ORAL_TABLET | Freq: Three times a day (TID) | ORAL | Status: DC
  Administered 2021-07-18 – 2021-07-19 (×3): 10 mg via ORAL
  Filled 2021-07-18 (×3): qty 2

## 2021-07-18 MED ORDER — LOPERAMIDE HCL 2 MG PO CAPS
4.0000 mg | ORAL_CAPSULE | Freq: Once | ORAL | Status: AC
  Administered 2021-07-18: 4 mg via ORAL
  Filled 2021-07-18: qty 2

## 2021-07-18 MED ORDER — VITAMIN D (ERGOCALCIFEROL) 1.25 MG (50000 UNIT) PO CAPS
50000.0000 [IU] | ORAL_CAPSULE | ORAL | Status: DC
  Administered 2021-07-19: 50000 [IU] via ORAL
  Filled 2021-07-18: qty 1

## 2021-07-18 MED ORDER — IRON 325 (65 FE) MG PO TABS: ORAL_TABLET | Freq: Every day | ORAL | Status: DC

## 2021-07-18 MED ORDER — SIMETHICONE 80 MG PO CHEW
80.0000 mg | CHEWABLE_TABLET | Freq: Once | ORAL | Status: AC
  Administered 2021-07-18: 80 mg via ORAL
  Filled 2021-07-18: qty 1

## 2021-07-18 MED ORDER — MAGNESIUM HYDROXIDE 400 MG/5ML PO SUSP: 30.0000 mL | Freq: Every day | ORAL | Status: DC | PRN

## 2021-07-18 MED ORDER — HYDROXYZINE PAMOATE 25 MG PO CAPS: 25.0000 mg | ORAL_CAPSULE | Freq: Two times a day (BID) | ORAL | Status: DC | PRN

## 2021-07-18 MED ORDER — FERROUS SULFATE 325 (65 FE) MG PO TABS
325.0000 mg | ORAL_TABLET | Freq: Every day | ORAL | Status: DC
  Administered 2021-07-18 – 2021-07-19 (×2): 325 mg via ORAL
  Filled 2021-07-18 (×2): qty 1

## 2021-07-18 MED ORDER — KETOROLAC TROMETHAMINE 30 MG/ML IJ SOLN: 30.0000 mg | Freq: Four times a day (QID) | INTRAMUSCULAR | Status: DC | PRN

## 2021-07-18 MED ORDER — EMPAGLIFLOZIN 25 MG PO TABS
25.0000 mg | ORAL_TABLET | Freq: Every day | ORAL | Status: DC
  Administered 2021-07-18 – 2021-07-19 (×2): 25 mg via ORAL
  Filled 2021-07-18 (×2): qty 1

## 2021-07-18 MED ORDER — FOLIC ACID 1 MG PO TABS
3.0000 mg | ORAL_TABLET | Freq: Every day | ORAL | Status: DC
  Administered 2021-07-18 – 2021-07-19 (×2): 3 mg via ORAL
  Filled 2021-07-18 (×2): qty 3

## 2021-07-18 MED ORDER — HYDROXYZINE HCL 25 MG PO TABS: 25.0000 mg | ORAL_TABLET | Freq: Two times a day (BID) | ORAL | Status: DC | PRN

## 2021-07-18 NOTE — Consult Note (Signed)
CARDIOLOGY CONSULT NOTE  Patient ID: Alicia Cook MRN: [id number] DOB/AGE: 1966/08/16 55 y.o.  Admit date: 07/16/2021 Referring Physician:  Eugenie Filler, MD Primary Physician:  [name], MD Reason for Consultation : HFrEF, hypotension   Patient ID: Alicia Cook, female    DOB: Apr 15, 1966, 55 y.o.   MRN: [id number]  Chief Complaint  Patient presents with   Headache   Alicia Cook  is a 55 y.o. 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 Cardiac MRI performed in 2011 and cardiac PET scan in August 2017 does not appear to be consistent with cardiac sarcoidosis.  She had repeat PET scan performed at [facility] on 03/05/2020 again confirming this.   She also has uncontrolled diabetes, morbid obesity, mild sleep apnea  but not tolerating CPAP, systemic  hypertension.  Patient underwent laparoscopic gastric sleeve on 12/20/2020 with subsequent significant weight loss.  Patient noted in November 2021 to have pericardial effusion which has been followed outpatient with serial echocardiograms.  She was recently seen outpatient with worsening orthopnea and repeat echocardiogram at that time revealed new regional wall motion abnormalities and further reduction of LVEF to 20-25% from 35-40% in 04/2021.  Echocardiogram in 04/2021 also revealed new moderate mitral regurgitation.  She subsequently underwent left and right heart catheterization 07/12/2021 with normal coronary arteries.  Patient presented to [facility] emergency department 07/16/2021 with complaints of headache, body aches, fatigue and dizziness, as well as hypotension.  In the emergency department patient was hypotensive and febrile, therefore subsequently admitted.  Her D-dimer is mildly elevated, CTA of the chest negative for PE and  pericardial effusion stable.  Patient's Delene Loll has been held given hypotension.  Patient was also hypokalemic at presentation, potassium has been replaced accordingly.  Serial troponins 17 --> 18.   Since admission patient's blood pressure has improved while holding Entresto and metoprolol.  Notably ESR and CRP elevated.  Patient seen and examined at approximately 1 PM.  States symptoms of dizziness and fatigue have improved, she has ambulated within her room without issue.  She does continue to have intermittent headache.  Denies chest pain, worsening dyspnea, leg edema.  Past Medical History:  Diagnosis Date   Cataract    CHF (congestive heart failure) ([place])    Depression    Diabetes mellitus without complication ([place])    Encounter for adjustment of biventricular implantable cardioverter-defibrillator (ICD) 06/13/2019   Hypertension    ICD: Cardiac defibrillator in situ 01/13/2015   [employer] Bi-V ICD (Dynagen X4 CRT-D) 01/13/2015 at [facility] - MRI Compatible  Scheduled Remote ICD check 4.22.20: 1 SVT/8 secs, normal function. Battery longevity is 8 years. RA pacing is 1 %, RV pacing is 99 %, and LV pacing is 99 %.   Nonischemic cardiomyopathy ([place])    a. EF 35-40% by cath in 2011 with normal cors b. s/p ICD implantation in 12/2014 c. EF 25% by NST in 02/01/2016   Sarcoidosis    Sarcoidosis 02/08/2016   Past Surgical History:  Procedure Laterality Date   ABDOMINAL SURGERY  12/20/2020    laparoscopic sleeve gastrectomy   CARDIAC CATHETERIZATION N/A 02/08/2016   Procedure: Left Heart Cath and Coronary Angiography;  Surgeon: Jettie Booze, MD;  Location: [facility];  Service: Cardiovascular;  Laterality: N/A;   CATARACT EXTRACTION Right 12/04/2018   CESAREAN SECTION  CHOLECYSTECTOMY     INSERTION OF ICD     a. s/p dual-chamber [employer] ICD 12/2014   LAPAROSCOPIC GASTRIC SLEEVE RESECTION     PACEMAKER INSERTION     RIGHT/LEFT HEART CATH AND  CORONARY ANGIOGRAPHY N/A 07/12/2021   Procedure: RIGHT/LEFT HEART CATH AND CORONARY ANGIOGRAPHY;  Surgeon: Nigel Mormon, MD;  Location: [facility];  Service: Cardiovascular;  Laterality: N/A;   TUBAL LIGATION     Family History  Problem Relation Age of Onset   Heart attack Father        a. Initial onset in his 52's. S/p CABG   Heart failure Father    Social History   Tobacco Use   Smoking status: Former    Packs/day: 1.00    Years: 20.00    Pack years: 20.00    Types: Cigarettes    Quit date: 2016    Years since quitting: 6.7   Smokeless tobacco: Never  Substance Use Topics   Alcohol use: No    Alcohol/week: 0.0 standard drinks    Marital Sttus: Single  ROS  Review of Systems  Constitutional: Negative for malaise/fatigue and weight gain.  Cardiovascular:  Negative for chest pain, claudication, leg swelling, near-syncope, orthopnea, palpitations, paroxysmal nocturnal dyspnea and syncope.  Respiratory:  Negative for shortness of breath.   Neurological:  Positive for dizziness and headaches.  All other systems reviewed and are negative. Objective   Vitals with BMI 07/18/2021 07/18/2021 07/18/2021  Height - - -  Weight - - -  BMI - - -  Systolic [phone]  Diastolic 80 81 81  Pulse 505 101 99    Blood pressure 115/80, pulse 100, temperature 98.7 F (37.1 C), temperature source Oral, resp. rate 18, height 5' 7"  (1.702 m), weight 101 kg, last menstrual period 04/17/2016, SpO2 100 %.    Physical Exam Vitals reviewed.  Constitutional:      General: She is not in acute distress.    Appearance: She is obese.  HENT:     Head: Normocephalic and atraumatic.     Right Ear: External ear normal.     Left Ear: External ear normal.     Nose: Nose normal.     Mouth/Throat:     Mouth: Mucous membranes are moist.  Eyes:     Conjunctiva/sclera: Conjunctivae normal.  Neck:     Vascular: No JVD.  Cardiovascular:     Rate and Rhythm: Normal rate and regular rhythm.      Pulses: Intact distal pulses.     Heart sounds: S1 normal and S2 normal. No murmur heard.   No gallop.  Pulmonary:     Effort: Pulmonary effort is normal. No respiratory distress.     Breath sounds: No wheezing, rhonchi or rales.  Abdominal:     General: Bowel sounds are normal. There is no distension.     Palpations: Abdomen is soft.  Musculoskeletal:     Right lower leg: No edema.     Left lower leg: No edema.  Skin:    General: Skin is warm and dry.  Neurological:     Mental Status: She is alert and oriented to person, place, and time.   Laboratory examination:   Recent Labs    07/16/21 1724 07/17/21 1444 07/18/21 0325  NA 135 137 137  K 3.4* 3.3* 3.9  CL 99 105 105  CO2 29 25 26   GLUCOSE 91 103* 83  BUN 12 13 11   CREATININE 1.05*  0.67 0.69  CALCIUM 9.1 8.9 8.9  GFRNONAA >60 >60 >60   estimated creatinine clearance is 98.2 mL/min (by C-G formula based on SCr of 0.69 mg/dL).  CMP Latest Ref Rng & Units 07/18/2021 07/17/2021 07/16/2021  Glucose 70 - 99 mg/dL 83 103(H) 91  BUN 6 - 20 mg/dL 11 13 12   Creatinine 0.44 - 1.00 mg/dL 0.69 0.67 1.05(H)  Sodium 135 - 145 mmol/L 137 137 135  Potassium 3.5 - 5.1 mmol/L 3.9 3.3(L) 3.4(L)  Chloride 98 - 111 mmol/L 105 105 99  CO2 22 - 32 mmol/L 26 25 29   Calcium 8.9 - 10.3 mg/dL 8.9 8.9 9.1  Total Protein 6.5 - 8.1 g/dL 6.5 6.8 7.3  Total Bilirubin 0.3 - 1.2 mg/dL 0.4 0.6 0.4  Alkaline Phos 38 - 126 U/L 78 84 90  AST 15 - 41 U/L 26 26 30   ALT 0 - 44 U/L 28 30 36   CBC Latest Ref Rng & Units 07/18/2021 07/17/2021 07/16/2021  WBC 4.0 - 10.5 K/uL 6.4 7.1 9.8  Hemoglobin 12.0 - 15.0 g/dL 12.9 13.1 12.7  Hematocrit 36.0 - 46.0 % 40.0 40.2 38.5  Platelets 150 - 400 K/uL 217 206 213   Lipid Panel No results for input(s): CHOL, TRIG, LDLCALC, VLDL, HDL, CHOLHDL, LDLDIRECT in the last 8760 hours.  HEMOGLOBIN A1C Lab Results  Component Value Date   HGBA1C 6.0 (H) 07/17/2021   MPG 125.5 07/17/2021   TSH No results for  input(s): TSH in the last 8760 hours. BNP (last 3 results) Recent Labs    04/19/21 1641 07/18/21 1419  BNP 195.4* 514.6*   Results for orders placed or performed during the hospital encounter of 07/16/21 (from the past 48 hour(s))  Lactic acid, plasma     Status: None   Collection Time: 07/16/21  5:24 PM  Result Value Ref Range   Lactic Acid, Venous 0.9 0.5 - 1.9 mmol/L    Comment: Performed at [facility], [address] [place], [place] [address]  Comprehensive metabolic panel     Status: Abnormal   Collection Time: 07/16/21  5:24 PM  Result Value Ref Range   Sodium 135 135 - 145 mmol/L   Potassium 3.4 (L) 3.5 - 5.1 mmol/L   Chloride 99 98 - 111 mmol/L   CO2 29 22 - 32 mmol/L   Glucose, Bld 91 70 - 99 mg/dL    Comment: Glucose reference range applies only to samples taken after fasting for at least 8 hours.   BUN 12 6 - 20 mg/dL   Creatinine, Ser 1.05 (H) 0.44 - 1.00 mg/dL   Calcium 9.1 8.9 - 10.3 mg/dL   Total Protein 7.3 6.5 - 8.1 g/dL   Albumin 3.6 3.5 - 5.0 g/dL   AST 30 15 - 41 U/L   ALT 36 0 - 44 U/L   Alkaline Phosphatase 90 38 - 126 U/L   Total Bilirubin 0.4 0.3 - 1.2 mg/dL   GFR, Estimated >60 >60 mL/min    Comment: (NOTE) Calculated using the CKD-EPI Creatinine Equation (2021)    Anion gap 7 5 - 15    Comment: Performed at [facility], [address] [place], [place] [address]  CBC WITH DIFFERENTIAL     Status: Abnormal   Collection Time: 07/16/21  5:24 PM  Result Value Ref Range   WBC 9.8 4.0 - 10.5 K/uL   RBC 4.41 3.87 - 5.11 MIL/uL   Hemoglobin 12.7 12.0 - 15.0  g/dL   HCT 38.5 36.0 - 46.0 %   MCV 87.3 80.0 - 100.0 fL   MCH 28.8 26.0 - 34.0 pg   MCHC 33.0 30.0 - 36.0 g/dL   RDW 13.6 11.5 - 15.5 %   Platelets 213 150 - 400 K/uL   nRBC 0.0 0.0 - 0.2 %   Neutrophils Relative % 86 %   Neutro Abs 8.4 (H) 1.7 - 7.7 K/uL   Lymphocytes Relative 6 %   Lymphs Abs 0.6 (L) 0.7 - 4.0 K/uL   Monocytes Relative 7 %   Monocytes Absolute  0.6 0.1 - 1.0 K/uL   Eosinophils Relative 1 %   Eosinophils Absolute 0.1 0.0 - 0.5 K/uL   Basophils Relative 0 %   Basophils Absolute 0.0 0.0 - 0.1 K/uL   Immature Granulocytes 0 %   Abs Immature Granulocytes 0.03 0.00 - 0.07 K/uL    Comment: Performed at [facility], [address] [place], [place] [address]  Protime-INR     Status: None   Collection Time: 07/16/21  5:24 PM  Result Value Ref Range   Prothrombin Time 13.2 11.4 - 15.2 seconds   INR 1.0 0.8 - 1.2    Comment: (NOTE) INR goal varies based on device and disease states. Performed at [facility], [address] [place], [place] [address]   APTT     Status: None   Collection Time: 07/16/21  5:24 PM  Result Value Ref Range   aPTT 24 24 - 36 seconds    Comment: Performed at [facility], [address] [place], [place] [address]  Urinalysis, Routine w reflex microscopic Urine, Clean Catch     Status: Abnormal   Collection Time: 07/16/21  5:24 PM  Result Value Ref Range   Color, Urine YELLOW YELLOW   APPearance CLEAR CLEAR   Specific Gravity, Urine 1.015 1.005 - 1.030   pH 6.0 5.0 - 8.0   Glucose, UA >=500 (A) NEGATIVE mg/dL   Hgb urine dipstick NEGATIVE NEGATIVE   Bilirubin Urine NEGATIVE NEGATIVE   Ketones, ur NEGATIVE NEGATIVE mg/dL   Protein, ur NEGATIVE NEGATIVE mg/dL   Nitrite NEGATIVE NEGATIVE   Leukocytes,Ua NEGATIVE NEGATIVE    Comment: Performed at [facility], [address] [place], [place] [address]  Urine Culture     Status: Abnormal   Collection Time: 07/16/21  5:24 PM   Specimen: Urine, Random  Result Value Ref Range   Specimen Description      URINE, RANDOM Performed at [facility], [address] [place], [place] [address]    Special Requests      NONE Performed at [facility], [address] [place], [place] [address]    Culture MULTIPLE SPECIES PRESENT, SUGGEST RECOLLECTION (A)    Report Status 07/18/2021 FINAL    Resp Panel by RT-PCR (Flu A&B, Covid) Nasopharyngeal Swab     Status: None   Collection Time: 07/16/21  5:24 PM   Specimen: Nasopharyngeal Swab; Nasopharyngeal(NP) swabs in vial transport medium  Result Value Ref Range   SARS Coronavirus 2 by RT PCR NEGATIVE NEGATIVE    Comment: (NOTE) SARS-CoV-2 target nucleic acids are NOT DETECTED.  The SARS-CoV-2 RNA is generally detectable in upper respiratory specimens during the acute phase of infection. The lowest concentration of SARS-CoV-2 viral copies this assay can detect is 138 copies/mL. A negative result does not preclude SARS-Cov-2 infection and should  not be used as the sole basis for treatment or other patient management decisions. A negative result may occur with  improper specimen collection/handling, submission of specimen other than nasopharyngeal swab, presence of viral mutation(s) within the areas targeted by this assay, and inadequate number of viral copies(<138 copies/mL). A negative result must be combined with clinical observations, patient history, and epidemiological information. The expected result is Negative.  Fact Sheet for Patients:  EntrepreneurPulse.com.au  Fact Sheet for Healthcare Providers:  IncredibleEmployment.be  This test is no t yet approved or cleared by the [place] FDA and  has been authorized for detection and/or diagnosis of SARS-CoV-2 by FDA under an Emergency Use Authorization (EUA). This EUA will remain  in effect (meaning this test can be used) for the duration of the COVID-19 declaration under Section 564(b)(1) of the Act, 21 U.S.C.section 360bbb-3(b)(1), unless the authorization is terminated  or revoked sooner.       Influenza A by PCR NEGATIVE NEGATIVE   Influenza B by PCR NEGATIVE NEGATIVE    Comment: (NOTE) The Xpert Xpress SARS-CoV-2/FLU/RSV plus assay is intended as an aid in the diagnosis of influenza from Nasopharyngeal swab specimens  and should not be used as a sole basis for treatment. Nasal washings and aspirates are unacceptable for Xpert Xpress SARS-CoV-2/FLU/RSV testing.  Fact Sheet for Patients: EntrepreneurPulse.com.au  Fact Sheet for Healthcare Providers: IncredibleEmployment.be  This test is not yet approved or cleared by the [place] FDA and has been authorized for detection and/or diagnosis of SARS-CoV-2 by FDA under an Emergency Use Authorization (EUA). This EUA will remain in effect (meaning this test can be used) for the duration of the COVID-19 declaration under Section 564(b)(1) of the Act, 21 U.S.C. section 360bbb-3(b)(1), unless the authorization is terminated or revoked.  Performed at [facility], [address] [place], [place] [address]   Urinalysis, Microscopic (reflex)     Status: Abnormal   Collection Time: 07/16/21  5:24 PM  Result Value Ref Range   RBC / HPF NONE SEEN 0 - 5 RBC/hpf   WBC, UA 0-5 0 - 5 WBC/hpf   Bacteria, UA MANY (A) NONE SEEN   Squamous Epithelial / LPF 0-5 0 - 5   Budding Yeast PRESENT     Comment: Performed at [facility], [address] [place], [place] [address]  Blood Culture (routine x 2)     Status: None (Preliminary result)   Collection Time: 07/16/21  5:30 PM   Specimen: BLOOD RIGHT ARM  Result Value Ref Range   Specimen Description      BLOOD RIGHT ARM BLOOD Performed at [facility], [address] [place], [place] [address]    Special Requests      Blood Culture adequate volume BOTTLES DRAWN AEROBIC AND ANAEROBIC Performed at [facility] [address] [place], [place] [address]    Culture      NO GROWTH 2 DAYS Performed at [facility], [place] [address] [place], [place] [address]    Report Status PENDING   Blood Culture (routine x 2)     Status: None (Preliminary result)   Collection Time: 07/16/21  5:40 PM   Specimen: BLOOD LEFT ARM  Result  Value Ref Range   Specimen Description      BLOOD LEFT ARM BLOOD Performed at [facility], [address], [place], [place] [address]    Special Requests  Blood Culture adequate volume BOTTLES DRAWN AEROBIC AND ANAEROBIC Performed at [facility], [address] [place], [place] [address]    Culture      NO GROWTH 2 DAYS Performed at [facility], [place] [address] [place], [place] [address]    Report Status PENDING   Lactic acid, plasma     Status: None   Collection Time: 07/16/21  8:57 PM  Result Value Ref Range   Lactic Acid, Venous 1.1 0.5 - 1.9 mmol/L    Comment: Performed at [facility], [address] [place], [place] [address]  Group A Strep by PCR     Status: None   Collection Time: 07/16/21  8:57 PM   Specimen: Throat; Sterile Swab  Result Value Ref Range   Group A Strep by PCR NOT DETECTED NOT DETECTED    Comment: Performed at [facility], [address] [place], [place] [address]  Troponin I (High Sensitivity)     Status: None   Collection Time: 07/17/21  2:44 PM  Result Value Ref Range   Troponin I (High Sensitivity) 17 <18 ng/L    Comment: (NOTE) Elevated high sensitivity troponin I (hsTnI) values and significant  changes across serial measurements may suggest ACS but many other  chronic and acute conditions are known to elevate hsTnI results.  Refer to the "Links" section for chest pain algorithms and additional  guidance. Performed at [facility], [place] [address], [place], [place] [address]   Comprehensive metabolic panel     Status: Abnormal   Collection Time: 07/17/21  2:44 PM  Result Value Ref Range   Sodium 137 135 - 145 mmol/L   Potassium 3.3 (L) 3.5 - 5.1 mmol/L   Chloride 105 98 - 111 mmol/L   CO2 25 22 - 32 mmol/L   Glucose, Bld 103 (H) 70 - 99 mg/dL    Comment: Glucose reference range applies only to samples taken after fasting for at least 8 hours.   BUN 13 6 - 20  mg/dL   Creatinine, Ser 0.67 0.44 - 1.00 mg/dL   Calcium 8.9 8.9 - 10.3 mg/dL   Total Protein 6.8 6.5 - 8.1 g/dL   Albumin 3.5 3.5 - 5.0 g/dL   AST 26 15 - 41 U/L   ALT 30 0 - 44 U/L   Alkaline Phosphatase 84 38 - 126 U/L   Total Bilirubin 0.6 0.3 - 1.2 mg/dL   GFR, Estimated >60 >60 mL/min    Comment: (NOTE) Calculated using the CKD-EPI Creatinine Equation (2021)    Anion gap 7 5 - 15    Comment: Performed at [facility], [place] [address] [place], [place] [address]  CBC with Differential/Platelet     Status: None   Collection Time: 07/17/21  2:44 PM  Result Value Ref Range   WBC 7.1 4.0 - 10.5 K/uL   RBC 4.59 3.87 - 5.11 MIL/uL   Hemoglobin 13.1 12.0 - 15.0 g/dL   HCT 40.2 36.0 - 46.0 %   MCV 87.6 80.0 - 100.0 fL   MCH 28.5 26.0 - 34.0 pg   MCHC 32.6 30.0 - 36.0 g/dL   RDW 13.6 11.5 - 15.5 %   Platelets 206 150 - 400 K/uL   nRBC 0.0 0.0 - 0.2 %   Neutrophils Relative % 82 %   Neutro Abs 5.8 1.7 - 7.7 K/uL   Lymphocytes Relative 10 %   Lymphs Abs  0.7 0.7 - 4.0 K/uL   Monocytes Relative 7 %   Monocytes Absolute 0.5 0.1 - 1.0 K/uL   Eosinophils Relative 1 %   Eosinophils Absolute 0.0 0.0 - 0.5 K/uL   Basophils Relative 0 %   Basophils Absolute 0.0 0.0 - 0.1 K/uL   Immature Granulocytes 0 %   Abs Immature Granulocytes 0.01 0.00 - 0.07 K/uL    Comment: Performed at [facility], [place] [address] [place], [place] [address]  Magnesium     Status: None   Collection Time: 07/17/21  2:44 PM  Result Value Ref Range   Magnesium 2.2 1.7 - 2.4 mg/dL    Comment: Performed at [facility], [place] [address], [place], [place] [address]  D-dimer, quantitative     Status: Abnormal   Collection Time: 07/17/21  2:44 PM  Result Value Ref Range   D-Dimer, Quant 5.17 (H) 0.00 - 0.50 ug/mL-FEU    Comment: (NOTE) At the manufacturer cut-off value of 0.5 g/mL FEU, this assay has a negative predictive value of 95-100%.This assay is  intended for use in conjunction with a clinical pretest probability (PTP) assessment model to exclude pulmonary embolism (PE) and deep venous thrombosis (DVT) in outpatients suspected of PE or DVT. Results should be correlated with clinical presentation. Performed at [facility], [place] [address] [place], [place] [address]   Glucose, capillary     Status: Abnormal   Collection Time: 07/17/21  4:21 PM  Result Value Ref Range   Glucose-Capillary 121 (H) 70 - 99 mg/dL    Comment: Glucose reference range applies only to samples taken after fasting for at least 8 hours.  Troponin I (High Sensitivity)     Status: Abnormal   Collection Time: 07/17/21  5:06 PM  Result Value Ref Range   Troponin I (High Sensitivity) 18 (H) <18 ng/L    Comment: (NOTE) Elevated high sensitivity troponin I (hsTnI) values and significant  changes across serial measurements may suggest ACS but many other  chronic and acute conditions are known to elevate hsTnI results.  Refer to the "Links" section for chest pain algorithms and additional  guidance. Performed at [facility], [place] [address] [place], [place] [address]   Hemoglobin A1c     Status: Abnormal   Collection Time: 07/17/21  5:06 PM  Result Value Ref Range   Hgb A1c MFr Bld 6.0 (H) 4.8 - 5.6 %    Comment: (NOTE) Pre diabetes:          5.7%-6.4%  Diabetes:              >6.4%  Glycemic control for   <7.0% adults with diabetes    Mean Plasma Glucose 125.5 mg/dL    Comment: Performed at [place] [address] [place], [place] [address]  HIV Antibody (routine testing w rflx)     Status: None   Collection Time: 07/17/21  5:06 PM  Result Value Ref Range   HIV Screen 4th Generation wRfx Non Reactive Non Reactive    Comment: Performed at [facility], [place] [address], [place], [place] [address]  Glucose, capillary     Status: Abnormal   Collection Time: 07/17/21  7:49 PM  Result Value Ref Range    Glucose-Capillary 115 (H) 70 - 99 mg/dL    Comment: Glucose reference range applies only to samples taken after fasting for at least 8 hours.  Comprehensive metabolic panel  Status: Abnormal   Collection Time: 07/18/21  3:25 AM  Result Value Ref Range   Sodium 137 135 - 145 mmol/L   Potassium 3.9 3.5 - 5.1 mmol/L   Chloride 105 98 - 111 mmol/L   CO2 26 22 - 32 mmol/L   Glucose, Bld 83 70 - 99 mg/dL    Comment: Glucose reference range applies only to samples taken after fasting for at least 8 hours.   BUN 11 6 - 20 mg/dL   Creatinine, Ser 0.69 0.44 - 1.00 mg/dL   Calcium 8.9 8.9 - 10.3 mg/dL   Total Protein 6.5 6.5 - 8.1 g/dL   Albumin 3.3 (L) 3.5 - 5.0 g/dL   AST 26 15 - 41 U/L   ALT 28 0 - 44 U/L   Alkaline Phosphatase 78 38 - 126 U/L   Total Bilirubin 0.4 0.3 - 1.2 mg/dL   GFR, Estimated >60 >60 mL/min    Comment: (NOTE) Calculated using the CKD-EPI Creatinine Equation (2021)    Anion gap 6 5 - 15    Comment: Performed at [facility], [place] [address], [place], [place] [address]  CBC     Status: None   Collection Time: 07/18/21  3:25 AM  Result Value Ref Range   WBC 6.4 4.0 - 10.5 K/uL   RBC 4.53 3.87 - 5.11 MIL/uL   Hemoglobin 12.9 12.0 - 15.0 g/dL   HCT 40.0 36.0 - 46.0 %   MCV 88.3 80.0 - 100.0 fL   MCH 28.5 26.0 - 34.0 pg   MCHC 32.3 30.0 - 36.0 g/dL   RDW 13.8 11.5 - 15.5 %   Platelets 217 150 - 400 K/uL   nRBC 0.0 0.0 - 0.2 %    Comment: Performed at [facility], [place] [address] [place], [place] [address]  Glucose, capillary     Status: Abnormal   Collection Time: 07/18/21  8:03 AM  Result Value Ref Range   Glucose-Capillary 103 (H) 70 - 99 mg/dL    Comment: Glucose reference range applies only to samples taken after fasting for at least 8 hours.  Sedimentation rate     Status: Abnormal   Collection Time: 07/18/21  9:02 AM  Result Value Ref Range   Sed Rate 30 (H) 0 - 22 mm/hr    Comment: Performed at [facility], [place] [address], [place], [place] [address]  C-reactive protein     Status: Abnormal   Collection Time: 07/18/21  9:02 AM  Result Value Ref Range   CRP 8.6 (H) <1.0 mg/dL    Comment: Performed at [facility], [place] [address], [place], [place] [address]  C Difficile Quick Screen w PCR reflex     Status: None   Collection Time: 07/18/21 10:23 AM   Specimen: STOOL  Result Value Ref Range   C Diff antigen NEGATIVE NEGATIVE   C Diff toxin NEGATIVE NEGATIVE   C Diff interpretation No C. difficile detected.     Comment: Performed at [facility], [place] [address] [place], [place] [address]  Glucose, capillary     Status: None   Collection Time: 07/18/21 12:29 PM  Result Value Ref Range   Glucose-Capillary 75 70 - 99 mg/dL    Comment: Glucose reference range applies only to samples taken after fasting for at least 8 hours.  Brain natriuretic peptide     Status: Abnormal   Collection Time: 07/18/21  2:19 PM  Result  Value Ref Range   B Natriuretic Peptide 514.6 (H) 0.0 - 100.0 pg/mL    Comment: Performed at [facility], [place] [address] [place], [place] [address]    Medications and allergies  No Known Allergies   Current Meds  Medication Sig   acetaminophen (TYLENOL) 500 MG tablet Take 1,000 mg by mouth every 6 (six) hours as needed for moderate pain or headache.   BIOTIN PO Take 1 capsule by mouth daily with lunch.   buPROPion (WELLBUTRIN XL) 300 MG 24 hr tablet Take 300 mg by mouth daily with lunch.   CALCIUM PO Take 1 tablet by mouth 2 (two) times daily. chewable   Ferrous Sulfate (IRON PO) Take 1 tablet by mouth daily.   folic acid (FOLVITE) 1 MG tablet Take 3 mg by mouth daily with lunch.   hydrOXYzine (VISTARIL) 25 MG capsule Take 25 mg by mouth 2 (two) times daily as needed for anxiety.   ivabradine (CORLANOR) 7.5 MG TABS tablet Take 1 tablet (7.5 mg total) by mouth 2 (two) times daily with a meal.    JARDIANCE 25 MG TABS tablet Take 25 mg by mouth daily with lunch.   magnesium hydroxide (MILK OF MAGNESIA) 400 MG/5ML suspension Take 30 mLs by mouth daily as needed for mild constipation.   methotrexate (RHEUMATREX) 2.5 MG tablet Take 20 mg by mouth every Sunday. Caution:Chemotherapy. Protect from light.   metoprolol succinate (TOPROL-XL) 25 MG 24 hr tablet Take 1 tablet (25 mg total) by mouth daily. Take with or immediately following a meal. (Patient taking differently: Take 25 mg by mouth at bedtime. Take with or immediately following a meal.)   Multiple Vitamin (MULTIVITAMIN WITH MINERALS) TABS tablet Take 1 tablet by mouth daily with lunch.   pantoprazole (PROTONIX) 40 MG tablet Take 40 mg by mouth daily as needed (acid reflux).   potassium chloride 20 MEQ TBCR Take 20 mEq by mouth daily. (Patient taking differently: Take 40 mEq by mouth daily with lunch.)   sacubitril-valsartan (ENTRESTO) 24-26 MG Take 1 tablet by mouth 2 (two) times daily.   Semaglutide, 2 MG/DOSE, (OZEMPIC, 2 MG/DOSE,) 8 MG/3ML SOPN Inject 2 mg into the skin every Sunday.   sertraline (ZOLOFT) 100 MG tablet Take 150 mg by mouth daily with lunch.   torsemide (DEMADEX) 20 MG tablet Take 1 tablet (20 mg total) by mouth daily. (Patient taking differently: Take 20 mg by mouth daily with lunch.)   vitamin B-12 (CYANOCOBALAMIN) 500 MCG tablet Take 500 mcg by mouth daily with lunch.   Vitamin D, Ergocalciferol, (DRISDOL) 1.25 MG (50000 UT) CAPS capsule Take 50,000 Units by mouth every Tuesday.   [DISCONTINUED] Semaglutide, 1 MG/DOSE, (OZEMPIC, 1 MG/DOSE,) 4 MG/3ML SOPN Inject 2 mg into the skin every Sunday.    Scheduled Meds:  buPROPion  300 mg Oral Daily   empagliflozin  25 mg Oral Q lunch   enoxaparin (LOVENOX) injection  50 mg Subcutaneous Q24H   ferrous sulfate  325 mg Oral Q breakfast   folic acid  3 mg Oral Q lunch   insulin aspart  0-15 Units Subcutaneous TID WC   insulin aspart  0-5 Units Subcutaneous QHS    ivabradine  7.5 mg Oral BID WC   midodrine  10 mg Oral TID with meals   sertraline  150 mg Oral Daily   vitamin B-12  500 mcg Oral Daily   [START ON 07/19/2021] Vitamin D (Ergocalciferol)  50,000 Units Oral Q Tue   Continuous Infusions: PRN Meds:.acetaminophen **  OR** acetaminophen, HYDROcodone-acetaminophen, hydrOXYzine, ketorolac, loperamide, magnesium hydroxide, ondansetron **OR** ondansetron (ZOFRAN) IV, pantoprazole   I/O last 3 completed shifts: In: 7371 [P.O.:1440; IV Piggyback:1000] Out: -  No intake/output data recorded.    Radiology:   CT HEAD WO CONTRAST 07/17/2021  IMPRESSION: Normal head CT.   CT Angio Chest Pulmonary Embolism (PE) W or WO Contrast: 07/17/2021 1. No pulmonary embolus or acute aortic syndrome.  2. Unchanged appearance of 2 cm thick pericardial effusion.  3. Marked left ventricular enlargement.   DG Chest Port 1 View 07/16/2021 Persistent cardiomegaly. The heart and mediastinal contours are unchanged. Aortic calcifications. Left cardiac pacemaker/defibrillator with two leads in similar position. No focal consolidation. No pulmonary edema. No pleural effusion. No pneumothorax. No acute osseous abnormality.  IMPRESSION: No active disease.   Cardiac Studies:   Nuclear stress test 02/01/2016: No significant ST segment changes or arrhythmias were noted during stress  Negative ETT for ischemia at workload achieved  No significant chest pain symptoms reported  No significant arrhythmia noted  nuclear images report to follow   Right heart catheterization and myocardial biopsy 07/17/2016: Mild pulmonary hypertension, mildly elevated pulmonary capillary wedge pressure, biopsy negative for sarcoid.   Cardiac PET sarcoidosis with nuclear medicine perfusion 03/05/2020: A myocardial SPECT scan was performed at rest and contains a large, severe perfusion defect involving the anterior, lateral and anterior lateral walls of the left ventricle, necessitating a FDG-PET  viability study.  Physiologic distribution of radiotracer. No substantial accumulation of radiotracer in the area of perfusion defect. Other PET findings: Few FDG avid bilateral axillary lymph nodes, index right axillary node measuring 1 cm (SUV max 3.5). Additionally, there are a few hypermetabolic superior mediastinal lymph nodes. Index right upper paratracheal lymph node shows SUV max 2.2. CT: Few small nodules are noted in right upper lobe, similar to 12/02/2019 CT exam. Subsegmental atelectasis/scarring in left lower lobe. Left chest wall cardiac rhythm maintenance device with distal leads terminating in right atrial appendage and right ventricle and coronary sinus. Small fat-containing of umbilical hernia.    Echocardiogram 04/29/2021:  Left ventricle cavity is normal in size. Moderate asymmetric hypertrophy  of the left ventricle, with posterior wall measuring 1.5 cm.Moderate  global hypokinesis with basal inferolateral akinesis. Moderately depressed  LV systolic function with EF 35-40% Doppler evidence of grade II (pseudonormal) diastolic dysfunction, elevated LAP.  Left atrial cavity is mildly dilated at 4 cm.  Tethering of posterior leaflet with moderate (grade III), posteriorly directed mitral regurgitation.  Moderate loculated pericardial effusion with clear fluids. There is no apparent hemodynamic significance.  Compared to previous study on 09/03/2020, pericardial effusion increased  from mild. Mitral regurgitation is new.    Echocardiogram 06/24/2021:  Severely depressed LV systolic function with EF 25%. Left ventricle cavity  is moderately dilated. Dilated cardiomyopathy. Moderate eccentric  hypertrophy of the left ventricle. Doppler evidence of grade I (impaired)  diastolic dysfunction, normal LAP. Left ventricle regional wall motion  findings: Severe global hypokinesis. Basal inferolateral, Basal inferior,  Mid inferolateral and Mid inferior akinesis. Calculated EF 25%.  Right  ventricle cavity is normal in size. Right ventricle pacemaker  visualized. Normal right ventricular function.  MV annulus is dilated. Moderate (Grade II) mitral regurgitation.  Pericardium is normal. Small posterior pericardial effusion.  Compared to the study done on 04/29/2021, pericardial effusion now appears  much less significant.  However the LVEF has reduced further from 35 to  40% to the present 20 to 25%. Regional wall motion abnormality new.  Right and  left heart catheterization coronary angiography 07/12/2021: Left dominant circulation No coronary artery disease Well compensated nonischemic cardiomyopathy  ICD:  Scheduled Remote ICD check 10/05/2020:  There were 2 atrial high rate episodes detected. The longest lasted 47 seconds in duration. No EGMs available for review. There was a <1 % cumulative atrial arrhythmia burden. No VHR episodes.  Health trends (patient activity, heart rate variability, average heart rates) are stable. The review of the implantable cardiac monitoring data remains stable. Battery longevity is 6 years . RA pacing is 1 %, RV pacing is 98 %, and LV pacing is 98 %.  EKG: 07/16/2021: Ventricular paced rhythm at a rate of 97 bpm.  Wide QRS complex.  No further analysis. 10/12/2020: AV paced rhythm at rate of 84 bpm.  Biventricular pacing detected.  Wide QRS complex.  No further analysis.   Assessment   HPI:   Katalea Ucci  Is 55 y.o. 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  Being followed since November 2021 for pericardial effusion, with recent echocardiogram revealing new wall motion abnormalities, subsequent left and right heart catheterization revealed normal coronary arteries.  Patient now presents with hypertension, dizziness, fatigue.  1.  Chronic HFrEF: 2.  Hypotension: 3.  Pericardial  effusion: 4. Hypokalemia: 5.  ICD: [employer] BiV ICD   Recommendations:   1.  Chronic HFrEF: Patient is presently well compensated without volume overload on exam.  Will check BNP. Given symptomatic hypotension agree with holding metoprolol and Entresto Advised patient continue as needed torsemide upon discharge Continue Jardiance  2.  Hypotension: Agree with holding Entresto and metoprolol at this time.  Suspect hypotension is related to ongoing weight loss following bariatric surgery.   Recommend initiation of midodrine 10 mg 3 times daily when patient is awake.  If blood pressure improves and remained stable could consider discharge from a cardiovascular standpoint tomorrow. Will follow-up outpatient and reevaluate ability to rechallenge HFrEF medications including Entresto and metoprolol.  3.  Pericardial effusion: Patient previously had moderate pericardial effusion, however on recent echocardiogram 06/24/2020 there was only small posterior pericardial effusion.  Do not suspect patient's hypotension to be related to pericardial effusion. Patient did have elevated ESR and CRP, however troponins were normal.  Low suspicion for myocarditis or pericarditis.  4. Hypokalemia: Now resolved with potassium replacement since admission.  Will defer further management to primary team.  5.  ICD: Louise ICD Will continue to monitor outpatient.   If patient's blood pressure improves she is stable from a cardiovascular standpoint for discharge tomorrow.  Patient was seen in collaboration with Dr. Einar Gip. He also reviewed patient's chart and examined the patient. Dr. Einar Gip is in agreement of the plan.    Alethia Berthold, PA-C 07/18/2021, 4:40 PM Office: [phone]

## 2021-07-18 NOTE — Progress Notes (Signed)
PROGRESS NOTE    Alicia Cook  WIO:[id number] DOB: 1966/02/08 DOA: 07/16/2021 PCP: [name], MD (Confirm with patient/family/NH records and if not entered, this HAS to be entered at [facility] point of entry. "No PCP" if truly none.)   Chief Complaint  Patient presents with   Headache    Brief Narrative:  Patient 55 year old female history of type 2 diabetes, chronic systolic heart failure/cardiomyopathy status post ICD placement, GAD, sarcoidosis presenting with headache, myalgias, fatigue and dizziness with movement, noted her blood pressure to be low at home and also with some complaints of pleuritic chest pain.  Patient noted to be hypokalemic on presentation, COVID-19 PCR negative.  Chest x-ray negative.  Elevated D-dimer and as such CT angiogram chest obtained which was negative for PE.  Patient also noted to have a fever on presentation of 100.7.  Patient admitted for further evaluation.   Assessment & Plan:   Principal Problem:   SIRS (systemic inflammatory response syndrome) (HCC) Active Problems:   Chest pain   Sarcoidosis   Nonischemic cardiomyopathy (HCC)   Chronic HFrEF (heart failure with reduced ejection fraction) (HCC)   OSA (obstructive sleep apnea)   Fever   Hypotension   Pericardial effusion   Depression   Hypokalemia   Headache   #1 SIRS -Patient admitted placed in the progressive care unit noted to have fevers on presentation with hypotension in the ED but no source of infection noted. -Urinalysis that was bland with urine cultures with multiple species. -Chest x-ray with no acute infiltrate. -CT angiogram chest negative for PE, no infiltrate noted. -Patient noted to be having diarrhea/watery loose stools today which she states usually happens after she had her weight loss surgery whereby she gets constipated on her iron supplementation and subsequently takes a laxative and developed significant diarrhea which improved with Imodium. -Due to  presentation with fevers, hypotension, myalgias C. difficile PCR obtained which was negative.  GI pathogen panel pending. -Hold off on antibiotics at this time. -Supportive care.  2.  Chronic systolic heart failure/chest pain/nonischemic cardiomyopathy -Per cardiology note on 07/13/2019 two 2D echo from 06/24/2021 with a EF of 25%. -Patient underwent recent right and left heart cath that showed nonischemic cardiomyopathy. -Cardiac enzymes obtain minimally elevated and flattened. -CRP and sed rate somewhat elevated. -Patient denies any ongoing chest pain at this time. -Cardiac medications of Entresto and metoprolol held due to low blood pressure. -Resume Jardiance. -Consult with cardiology for further evaluation and management.  3.  Sarcoidosis -Continue home regimen of medications. -Outpatient follow-up.  4.  Depression/anxiety -Zoloft, Wellbutrin.  5.  Hypokalemia -Potassium at 3.9. -Magnesium at 2.2. -Follow. -Likely resume home regimen of oral potassium supplementation tomorrow.  6.  Well-controlled diabetes mellitus type 2 -Hemoglobin A1c 6.0. -Resume home regimen Jardiance. -SSI.  7.  Headache -Reported worst headache of her life. -Head CT done unremarkable. -Improvement with headache. -Pain management.  8.  OSA -CPAP nightly.  9.  Pericardial effusion -Patient with history of pericardial effusion, 2D echo 06/24/2020 with small posterior pericardial effusion. -ESR CRP elevated, troponins flat. -Cardiology consulted due to concerns also for pericarditis.  10.  Hypotension -??  Etiology. -No source of infection noted. -Continue to hold Entresto and metoprolol. -Midodrine started per cardiology.  DVT prophylaxis: Code Status: Full Family Communication: Updated patient.  No family at bedside. Disposition:   Status is: Inpatient  Remains inpatient appropriate because:Inpatient level of care appropriate due to severity of illness  Dispo: The patient is from:  Home  Anticipated d/c is to: Home              Patient currently is not medically stable to d/c.   Difficult to place patient No       Consultants:  Cardiology  Procedures:  Ct angio chest 07/17/2021 Chest x-ray 07/16/2021 CT head 07/17/2021  Antimicrobials:  None   Subjective: Laying in bed.  Overall feeling much better than she did on admission.  Denies any significant dizziness.  No chest pain.  No shortness of breath.  Denies any abdominal pain.  Complaining of diarrhea that she states she has ever so often since her weight loss surgery.  States because she is on oral iron becomes constipated, has to take laxatives as needed and states after taking laxatives continues to have watery stools which controlled with Imodium.  Hoping to get some Imodium.  Hoping to be discharged today.  Objective: Vitals:   07/18/21 0145 07/18/21 0543 07/18/21 1500 07/18/21 2018  BP: 110/81 115/81 115/80 125/76  Pulse: 99 (!) 101 100 99  Resp: 18 18  20   Temp: 99.2 F (37.3 C) 99.9 F (37.7 C) 98.7 F (37.1 C) 99.3 F (37.4 C)  TempSrc: Oral Oral Oral Oral  SpO2: 100% 95% 100% 100%  Weight:      Height:        Intake/Output Summary (Last 24 hours) at 07/18/2021 2022 Last data filed at 07/18/2021 0543 Gross per 24 hour  Intake 960 ml  Output --  Net 960 ml   Filed Weights   07/16/21 1651 07/17/21 1325  Weight: 101.6 kg 101 kg    Examination:  General exam: Appears calm and comfortable  Respiratory system: Clear to auscultation. Respiratory effort normal. Cardiovascular system: S1 & S2 heard, RRR. No JVD, murmurs, rubs, gallops or clicks. No pedal edema. Gastrointestinal system: Abdomen is nondistended, soft and nontender. No organomegaly or masses felt. Normal bowel sounds heard. Central nervous system: Alert and oriented. No focal neurological deficits. Extremities: Symmetric 5 x 5 power. Skin: No rashes, lesions or ulcers Psychiatry: Judgement and insight appear  normal. Mood & affect appropriate.     Data Reviewed: I have personally reviewed following labs and imaging studies  CBC: Recent Labs  Lab 07/12/21 1126 07/12/21 1127 07/16/21 1724 07/17/21 1444 07/18/21 0325  WBC  --   --  9.8 7.1 6.4  NEUTROABS  --   --  8.4* 5.8  --   HGB 13.3 12.9 12.7 13.1 12.9  HCT 39.0 38.0 38.5 40.2 40.0  MCV  --   --  87.3 87.6 88.3  PLT  --   --  213 206 621    Basic Metabolic Panel: Recent Labs  Lab 07/12/21 1126 07/12/21 1127 07/16/21 1724 07/17/21 1444 07/18/21 0325  NA 142 142 135 137 137  K 3.3* 3.2* 3.4* 3.3* 3.9  CL  --   --  99 105 105  CO2  --   --  29 25 26   GLUCOSE  --   --  91 103* 83  BUN  --   --  12 13 11   CREATININE  --   --  1.05* 0.67 0.69  CALCIUM  --   --  9.1 8.9 8.9  MG  --   --   --  2.2  --     GFR: Estimated Creatinine Clearance: 98.2 mL/min (by C-G formula based on SCr of 0.69 mg/dL).  Liver Function Tests: Recent Labs  Lab 07/16/21 1724 07/17/21 1444 07/18/21 0325  AST 30 26 26   ALT 36 30 28  ALKPHOS 90 84 78  BILITOT 0.4 0.6 0.4  PROT 7.3 6.8 6.5  ALBUMIN 3.6 3.5 3.3*    CBG: Recent Labs  Lab 07/17/21 1949 07/18/21 0803 07/18/21 1229 07/18/21 1654 07/18/21 2021  GLUCAP 115* 103* 75 84 148*     Recent Results (from the past 240 hour(s))  Urine Culture     Status: Abnormal   Collection Time: 07/16/21  5:24 PM   Specimen: Urine, Random  Result Value Ref Range Status   Specimen Description   Final    URINE, RANDOM Performed at [facility], [address] [place], [place] [address]    Special Requests   Final    NONE Performed at [facility], [address] [place], [place] [address]    Culture MULTIPLE SPECIES PRESENT, SUGGEST RECOLLECTION (A)  Final   Report Status 07/18/2021 FINAL  Final  Resp Panel by RT-PCR (Flu A&B, Covid) Nasopharyngeal Swab     Status: None   Collection Time: 07/16/21  5:24 PM   Specimen: Nasopharyngeal Swab; Nasopharyngeal(NP)  swabs in vial transport medium  Result Value Ref Range Status   SARS Coronavirus 2 by RT PCR NEGATIVE NEGATIVE Final    Comment: (NOTE) SARS-CoV-2 target nucleic acids are NOT DETECTED.  The SARS-CoV-2 RNA is generally detectable in upper respiratory specimens during the acute phase of infection. The lowest concentration of SARS-CoV-2 viral copies this assay can detect is 138 copies/mL. A negative result does not preclude SARS-Cov-2 infection and should not be used as the sole basis for treatment or other patient management decisions. A negative result may occur with  improper specimen collection/handling, submission of specimen other than nasopharyngeal swab, presence of viral mutation(s) within the areas targeted by this assay, and inadequate number of viral copies(<138 copies/mL). A negative result must be combined with clinical observations, patient history, and epidemiological information. The expected result is Negative.  Fact Sheet for Patients:  EntrepreneurPulse.com.au  Fact Sheet for Healthcare Providers:  IncredibleEmployment.be  This test is no t yet approved or cleared by the [place] FDA and  has been authorized for detection and/or diagnosis of SARS-CoV-2 by FDA under an Emergency Use Authorization (EUA). This EUA will remain  in effect (meaning this test can be used) for the duration of the COVID-19 declaration under Section 564(b)(1) of the Act, 21 U.S.C.section 360bbb-3(b)(1), unless the authorization is terminated  or revoked sooner.       Influenza A by PCR NEGATIVE NEGATIVE Final   Influenza B by PCR NEGATIVE NEGATIVE Final    Comment: (NOTE) The Xpert Xpress SARS-CoV-2/FLU/RSV plus assay is intended as an aid in the diagnosis of influenza from Nasopharyngeal swab specimens and should not be used as a sole basis for treatment. Nasal washings and aspirates are unacceptable for Xpert Xpress  SARS-CoV-2/FLU/RSV testing.  Fact Sheet for Patients: EntrepreneurPulse.com.au  Fact Sheet for Healthcare Providers: IncredibleEmployment.be  This test is not yet approved or cleared by the [place] FDA and has been authorized for detection and/or diagnosis of SARS-CoV-2 by FDA under an Emergency Use Authorization (EUA). This EUA will remain in effect (meaning this test can be used) for the duration of the COVID-19 declaration under Section 564(b)(1) of the Act, 21 U.S.C. section 360bbb-3(b)(1), unless the authorization is terminated or revoked.  Performed at [facility], [address] [place], [place] [address]   Blood Culture (routine x 2)  Status: None (Preliminary result)   Collection Time: 07/16/21  5:30 PM   Specimen: BLOOD RIGHT ARM  Result Value Ref Range Status   Specimen Description   Final    BLOOD RIGHT ARM BLOOD Performed at [facility], [address] [place], [place] [address]    Special Requests   Final    Blood Culture adequate volume BOTTLES DRAWN AEROBIC AND ANAEROBIC Performed at [facility], [address] [place], [place] [address]    Culture   Final    NO GROWTH 2 DAYS Performed at [facility], [place] [address] [place], [place] [address]    Report Status PENDING  Incomplete  Blood Culture (routine x 2)     Status: None (Preliminary result)   Collection Time: 07/16/21  5:40 PM   Specimen: BLOOD LEFT ARM  Result Value Ref Range Status   Specimen Description   Final    BLOOD LEFT ARM BLOOD Performed at [facility], [address] [place], [place] [address]    Special Requests   Final    Blood Culture adequate volume BOTTLES DRAWN AEROBIC AND ANAEROBIC Performed at [facility], [address] [place], [place] [address]    Culture   Final    NO GROWTH 2 DAYS Performed at [facility], [place] [address] [place], [place] [address]     Report Status PENDING  Incomplete  Group A Strep by PCR     Status: None   Collection Time: 07/16/21  8:57 PM   Specimen: Throat; Sterile Swab  Result Value Ref Range Status   Group A Strep by PCR NOT DETECTED NOT DETECTED Final    Comment: Performed at [facility], [address] [place], [place] [address]  C Difficile Quick Screen w PCR reflex     Status: None   Collection Time: 07/18/21 10:23 AM   Specimen: STOOL  Result Value Ref Range Status   C Diff antigen NEGATIVE NEGATIVE Final   C Diff toxin NEGATIVE NEGATIVE Final   C Diff interpretation No C. difficile detected.  Final    Comment: Performed at [facility], [place] [address], [place], [place] [address]         Radiology Studies: CT HEAD WO CONTRAST  Result Date: 07/17/2021 CLINICAL DATA:  Headache EXAM: CT HEAD WITHOUT CONTRAST TECHNIQUE: Contiguous axial images were obtained from the base of the skull through the vertex without intravenous contrast. COMPARISON:  None. FINDINGS: Brain: There is no mass, hemorrhage or extra-axial collection. The size and configuration of the ventricles and extra-axial CSF spaces are normal. The brain parenchyma is normal, without acute or chronic infarction. Vascular: No abnormal hyperdensity of the major intracranial arteries or dural venous sinuses. No intracranial atherosclerosis. Skull: The visualized skull base, calvarium and extracranial soft tissues are normal. Sinuses/Orbits: No fluid levels or advanced mucosal thickening of the visualized paranasal sinuses. No mastoid or middle ear effusion. The orbits are normal. IMPRESSION: Normal head CT. Electronically Signed   By: Ulyses Jarred M.D.   On: 07/17/2021 22:26   CT Angio Chest Pulmonary Embolism (PE) W or WO Contrast  Result Date: 07/17/2021 CLINICAL DATA:  Headaches.  Shortness of breath.  Positive D-dimer. EXAM: CT ANGIOGRAPHY CHEST WITH CONTRAST TECHNIQUE: Multidetector CT imaging of the chest was  performed using the standard protocol during bolus administration of intravenous contrast. Multiplanar CT image reconstructions and MIPs were  obtained to evaluate the vascular anatomy. CONTRAST:  31m OMNIPAQUE IOHEXOL 350 MG/ML SOLN COMPARISON:  04/19/2021 FINDINGS: Cardiovascular: Contrast injection is sufficient to demonstrate satisfactory opacification of the pulmonary arteries to the segmental level. There is no pulmonary embolus or evidence of right heart strain. The size of the main pulmonary artery is normal. Heart size is normal, with no pericardial effusion. Left ventricular enlargement. 2 cm thick pericardial effusion, unchanged. Mediastinum/Nodes: Level 6 lymph nodes measure up to 8 mm. No axillary adenopathy. Lungs/Pleura: Airways are patent. No pleural effusion, lobar consolidation, pneumothorax or pulmonary infarction. Upper Abdomen: Contrast bolus timing is not optimized for evaluation of the abdominal organs. The visualized portions of the organs of the upper abdomen are normal. Musculoskeletal: No chest wall abnormality. No bony spinal canal stenosis. Review of the MIP images confirms the above findings. IMPRESSION: 1. No pulmonary embolus or acute aortic syndrome. 2. Unchanged appearance of 2 cm thick pericardial effusion. 3. Marked left ventricular enlargement. Electronically Signed   By: KUlyses JarredM.D.   On: 07/17/2021 22:32        Scheduled Meds:  buPROPion  300 mg Oral Daily   empagliflozin  25 mg Oral Q lunch   enoxaparin (LOVENOX) injection  50 mg Subcutaneous Q24H   ferrous sulfate  325 mg Oral Q breakfast   folic acid  3 mg Oral Q lunch   insulin aspart  0-15 Units Subcutaneous TID WC   insulin aspart  0-5 Units Subcutaneous QHS   ivabradine  7.5 mg Oral BID WC   midodrine  10 mg Oral TID with meals   sertraline  150 mg Oral Daily   vitamin B-12  500 mcg Oral Daily   [START ON 07/19/2021] Vitamin D (Ergocalciferol)  50,000 Units Oral Q Tue   Continuous Infusions:    LOS: 1 day    Time spent: 40 mins    DIrine Seal MD [facility]   To contact the attending provider between 7A-7P or the covering provider during after hours 7P-7A, please log into the web site [email or web address] and access using universal [place] password for that web site. If you do not have the password, please call the hospital operator.  07/18/2021, 8:22 PM

## 2021-07-18 NOTE — Progress Notes (Signed)
Pt refusing CPAP states she cannot sleep with it and does not need it since her weight loss surgery.

## 2021-07-19 DIAGNOSIS — I9589 Other hypotension: Secondary | ICD-10-CM

## 2021-07-19 DIAGNOSIS — A020 Salmonella enteritis: Principal | ICD-10-CM

## 2021-07-19 LAB — BASIC METABOLIC PANEL
Anion gap: 8 (ref 5–15)
BUN: 9 mg/dL (ref 6–20)
CO2: 27 mmol/L (ref 22–32)
Calcium: 9.1 mg/dL (ref 8.9–10.3)
Chloride: 106 mmol/L (ref 98–111)
Creatinine, Ser: 0.75 mg/dL (ref 0.44–1.00)
GFR, Estimated: >60 mL/min (ref >60)
Glucose, Bld: 84 mg/dL (ref 70–99)
Potassium: 4.5 mmol/L (ref 3.5–5.1)
Sodium: 141 mmol/L (ref 135–145)

## 2021-07-19 LAB — CBC
HCT: 37.6 % (ref 36.0–46.0)
Hemoglobin: 12.3 g/dL (ref 12.0–15.0)
MCH: 28.7 pg (ref 26.0–34.0)
MCHC: 32.7 g/dL (ref 30.0–36.0)
MCV: 87.6 fL (ref 80.0–100.0)
Platelets: 237 10*3/uL (ref 150–400)
RBC: 4.29 MIL/uL (ref 3.87–5.11)
RDW: 13.7 % (ref 11.5–15.5)
WBC: 8.2 10*3/uL (ref 4.0–10.5)
nRBC: 0 % (ref 0.0–0.2)

## 2021-07-19 LAB — GASTROINTESTINAL PANEL BY PCR, STOOL (REPLACES STOOL CULTURE)

## 2021-07-19 LAB — MAGNESIUM: Magnesium: 2.1 mg/dL (ref 1.7–2.4)

## 2021-07-19 LAB — GLUCOSE, CAPILLARY
Glucose-Capillary: 83 mg/dL (ref 70–99)
Glucose-Capillary: 79 mg/dL (ref 70–99)

## 2021-07-19 MED ORDER — AZITHROMYCIN 500 MG PO TABS: ORAL_TABLET | ORAL | 0 refills | Status: AC

## 2021-07-19 MED ORDER — TORSEMIDE 20 MG PO TABS: 20.0000 mg | ORAL_TABLET | Freq: Every day | ORAL | Status: DC | PRN

## 2021-07-19 MED ORDER — MIDODRINE HCL 10 MG PO TABS: 10.0000 mg | ORAL_TABLET | Freq: Three times a day (TID) | ORAL | 1 refills | Status: DC

## 2021-07-19 MED ORDER — POTASSIUM CHLORIDE ER 20 MEQ PO TBCR: 20.0000 meq | EXTENDED_RELEASE_TABLET | Freq: Every day | ORAL | Status: DC | PRN

## 2021-07-19 MED ORDER — LOPERAMIDE HCL 2 MG PO CAPS: 2.0000 mg | ORAL_CAPSULE | ORAL | 0 refills | Status: DC | PRN

## 2021-07-19 MED ORDER — SODIUM CHLORIDE 0.9 % IV SOLN
2.0000 g | INTRAVENOUS | Status: DC
  Administered 2021-07-19: 2 g via INTRAVENOUS
  Filled 2021-07-19: qty 20

## 2021-07-19 MED ORDER — CIPROFLOXACIN IN D5W 400 MG/200ML IV SOLN: 400.0000 mg | Freq: Two times a day (BID) | INTRAVENOUS | Status: DC

## 2021-07-19 NOTE — Progress Notes (Signed)
Subjective:  Presently asymptomatic.  Blood pressure has been stable since addition of midodrine.  Wants to go home.  Intake/Output from previous day:  I/O last 3 completed shifts: In: 1440 [P.O.:1440] Out: -  No intake/output data recorded.  Blood pressure 115/79, pulse 92, temperature 99.1 F (37.3 C), temperature source Oral, resp. rate 20, height 5' 7"  (1.702 m), weight 101 kg, last menstrual period 04/17/2016, SpO2 98 %.  Physical Exam Constitutional:      Appearance: She is obese.  Neck:     Vascular: No carotid bruit or JVD.  Cardiovascular:     Rate and Rhythm: Normal rate and regular rhythm.     Pulses: Intact distal pulses.     Heart sounds: Normal heart sounds. No murmur heard.   No gallop.  Pulmonary:     Effort: Pulmonary effort is normal.     Breath sounds: Normal breath sounds.  Abdominal:     General: Bowel sounds are normal.     Palpations: Abdomen is soft.  Musculoskeletal:        General: No swelling.     Cervical back: Neck supple.  Skin:    General: Skin is warm and dry.  Neurological:     Mental Status: She is alert and oriented to person, place, and time.  Psychiatric:        Mood and Affect: Mood normal.    Lab Results: BMP BNP (last 3 results) Recent Labs    04/19/21 1641 07/18/21 1419  BNP 195.4* 514.6*    ProBNP (last 3 results) No results for input(s): PROBNP in the last 8760 hours. BMP Latest Ref Rng & Units 07/19/2021 07/18/2021 07/17/2021  Glucose 70 - 99 mg/dL 84 83 103(H)  BUN 6 - 20 mg/dL 9 11 13   Creatinine 0.44 - 1.00 mg/dL 0.75 0.69 0.67  Sodium 135 - 145 mmol/L 141 137 137  Potassium 3.5 - 5.1 mmol/L 4.5 3.9 3.3(L)  Chloride 98 - 111 mmol/L 106 105 105  CO2 22 - 32 mmol/L 27 26 25   Calcium 8.9 - 10.3 mg/dL 9.1 8.9 8.9   Hepatic Function Latest Ref Rng & Units 07/18/2021 07/17/2021 07/16/2021  Total Protein 6.5 - 8.1 g/dL 6.5 6.8 7.3  Albumin 3.5 - 5.0 g/dL 3.3(L) 3.5 3.6  AST 15 - 41 U/L 26 26 30   ALT 0 - 44 U/L 28 30  36  Alk Phosphatase 38 - 126 U/L 78 84 90  Total Bilirubin 0.3 - 1.2 mg/dL 0.4 0.6 0.4   CBC Latest Ref Rng & Units 07/19/2021 07/18/2021 07/17/2021  WBC 4.0 - 10.5 K/uL 8.2 6.4 7.1  Hemoglobin 12.0 - 15.0 g/dL 12.3 12.9 13.1  Hematocrit 36.0 - 46.0 % 37.6 40.0 40.2  Platelets 150 - 400 K/uL 237 217 206   Lipid Panel     Component Value Date/Time   CHOL 165 02/09/2016 0700   TRIG 94 02/09/2016 0700   HDL 64 02/09/2016 0700   CHOLHDL 2.6 02/09/2016 0700   VLDL 19 02/09/2016 0700   LDLCALC 82 02/09/2016 0700   Cardiac Panel (last 3 results) No results for input(s): CKTOTAL, CKMB, TROPONINI, RELINDX in the last 72 hours.  HEMOGLOBIN A1C Lab Results  Component Value Date   HGBA1C 6.0 (H) 07/17/2021   MPG 125.5 07/17/2021   No results found for this or any previous visit (from the past 43800 hour(s)).  Scheduled Meds:  buPROPion  300 mg Oral Daily   empagliflozin  25 mg Oral Q lunch   enoxaparin (  LOVENOX) injection  50 mg Subcutaneous Q24H   ferrous sulfate  325 mg Oral Q breakfast   folic acid  3 mg Oral Q lunch   insulin aspart  0-15 Units Subcutaneous TID WC   insulin aspart  0-5 Units Subcutaneous QHS   ivabradine  7.5 mg Oral BID WC   midodrine  10 mg Oral TID with meals   sertraline  150 mg Oral Daily   vitamin B-12  500 mcg Oral Daily   Vitamin D (Ergocalciferol)  50,000 Units Oral Q Tue   Continuous Infusions: PRN Meds:.acetaminophen **OR** acetaminophen, HYDROcodone-acetaminophen, hydrOXYzine, ketorolac, loperamide, magnesium hydroxide, ondansetron **OR** ondansetron (ZOFRAN) IV, pantoprazole  Assessment/Plan:  1.  Chronic systolic heart failure 2.  Nonischemic dilated cardiomyopathy 3.  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  Continue 10 mg 3 times daily while awake, patient aware not to take the medication and lay down in bed.  No clinical  evidence of heart failure.  She is on ivabradine for heart rate reduction however not sure if this is helpful, I will continue this for now and decide upon outpatient visit with I can discontinue this.  I will challenge her with carvedilol 3.125 mg twice daily in the outpatient basis.  Her appointment has already been set up with [place].     Adrian Prows, MD, [facility] 07/19/2021, 9:02 AM Office: [phone] Fax: [phone] Pager: [phone]

## 2021-07-19 NOTE — Progress Notes (Signed)
Pt to be discharged to home this afternoon. Discharge teaching including all Medications and schedules reviewed with the Pt. Pt verbalized understanding of all discharge teaching. Discharge AVS with Pt at time of discharge.

## 2021-07-19 NOTE — Discharge Summary (Signed)
Physician Discharge Summary  Alicia Cook AXK:[id number] DOB: Feb 08, 1966 DOA: 07/16/2021  PCP: [name], MD  Admit date: 07/16/2021 Discharge date: 07/19/2021  Time spent: 55 minutes  Recommendations for Outpatient Follow-up:  Follow-up with [name], MD in 2 weeks.  On follow-up patient will need a basic metabolic profile done to follow-up on electrolytes and renal function.  Salmonella gastroenteritis will need to be followed up upon. Follow-up with Lawerance Cruel, [facility] on 07/27/2021.   Discharge Diagnoses:  Principal Problem:   SIRS (systemic inflammatory response syndrome) (HCC) Active Problems:   Salmonella gastroenteritis   Chest pain   Sarcoidosis   Nonischemic cardiomyopathy (HCC)   Chronic HFrEF (heart failure with reduced ejection fraction) (HCC)   OSA (obstructive sleep apnea)   Fever   Hypotension   Pericardial effusion   Depression   Hypokalemia   Headache   Discharge Condition: Stable and improved  Diet recommendation: Heart healthy  Filed Weights   07/16/21 1651 07/17/21 1325  Weight: 101.6 kg 101 kg    History of present illness:  HPI per Dr. Rhys Cook is a 55 y.o. female with medical history significant of DM2, chronic systolic HF w/ ICD placement, GAD, sarcoidosis. Presented with headache, body aches. She reported her symptoms started 3 or 4 days ago. She had a throbbing headache from the top of her head to the base of her neck. It came in waves. OTC pain relief did not help. She has also felt fatigue and dizziness with movement. She noticed that her BP was low at home. She became concerned when her symptoms did not improve yesterday, so she came to the ED yesterday afternoon for help. She denies any other aggravating or alleviating factors.    ED Course: She was febrile in the ED. Her K+ was low. She was COVID negative. CXR was negative.   Hospital Course:  #1 SIRS/Salmonella  gastroenteritis -Patient admitted placed in the progressive care unit noted to have fevers on presentation with hypotension in the ED but no source of infection noted. -Urinalysis that was bland with urine cultures with multiple species. -Chest x-ray with no acute infiltrate. -CT angiogram chest negative for PE, no infiltrate noted. -Patient noted to be having diarrhea/watery loose stools today which she states usually happens after she had her weight loss surgery whereby she gets constipated on her iron supplementation and subsequently takes a laxative and developed significant diarrhea which improved with Imodium. -Due to presentation with fevers, hypotension, myalgias C. difficile PCR obtained which was negative.  GI pathogen panel was positive for Salmonella.   -Due to patient's complex cardiac history ID was curb sided and recommended dose of IV Rocephin prior to discharge and discharged home on azithromycin 1 g x 1 day and then 500 mg daily for 4 more days.  -Outpatient follow-up with PCP.   2.  Chronic systolic heart failure/chest pain/nonischemic cardiomyopathy -Per cardiology note on 07/13/2019 two 2D echo from 06/24/2021 with a EF of 25%. -Patient underwent recent right and left heart cath that showed nonischemic cardiomyopathy. -Cardiac enzymes obtain minimally elevated and flattened. -CRP and sed rate somewhat elevated. -Patient denied  any ongoing chest pain during the hospitalization. -Cardiac medications of Entresto and metoprolol held due to low blood pressure and will be discontinued on discharge and to follow-up with cardiology. -Patient placed back on home regimen Jardiance  -Patient seen in consultation by cardiology and will follow up with cardiology in the outpatient setting.  3.  Sarcoidosis -Continue home  regimen of medications. -Outpatient follow-up.  4.  Depression/anxiety -Patient maintained on home regimen Zoloft, Wellbutrin.  5.  Hypokalemia -Repleted.  Potassium  was 4.5 by day of discharge and magnesium was 2.1.   -Outpatient follow-up with cardiology.   6.  Well-controlled diabetes mellitus type 2 -Hemoglobin A1c 6.0. -Placed back on home regimen Jardiance. -Patient maintained on SSI.  7.  Headache -Reported worst headache of her life. -Head CT done unremarkable. -Headache had resolved by day of discharge.  8.  OSA -Patient placed on CPAP nightly however patient unable to tolerate and as such CPAP discontinued.   9.  Pericardial effusion -Patient with history of pericardial effusion, 2D echo 06/24/2020 with small posterior pericardial effusion. -ESR CRP elevated, troponins flat. -Cardiology consulted due to concerns also for pericarditis and did not feel patient had pericarditis..  10.  Hypotension -??  Etiology. -GI pathogen panel came back positive for Salmonella.   -Patient given a dose of IV Rocephin will be discharged home on azithromycin.   -Patient's Entresto and metoprolol were held during the hospitalization and discontinued on discharge until outpatient follow-up with cardiology.  -Patient started on midodrine per cardiology with improvement with blood pressure.   -Outpatient follow-up with cardiology.  Procedures: Ct angio chest 07/17/2021 Chest x-ray 07/16/2021 CT head 07/17/2021  Consultations: Cardiology: Dr. Einar Gip 07/18/2021 Curb sided ID.  Discharge Exam: Vitals:   07/19/21 0900 07/19/21 1403  BP: 122/80 111/69  Pulse: (!) 107 97  Resp: 17 20  Temp: 99.4 F (37.4 C) 99.9 F (37.7 C)  SpO2: 100% 100%    General: NAD Cardiovascular: RRR no murmurs rubs or gallops.  No JVD.  No lower extremity edema. Respiratory: CTA B.  No wheezes, no crackles, no rhonchi.  Discharge Instructions   Discharge Instructions     Diet - low sodium heart healthy   Complete by: As directed    Increase activity slowly   Complete by: As directed       Allergies as of 07/19/2021   No Known Allergies      Medication List      STOP taking these medications    Entresto 24-26 MG Generic drug: sacubitril-valsartan   metoprolol succinate 25 MG 24 hr tablet Commonly known as: TOPROL-XL       TAKE these medications    acetaminophen 500 MG tablet Commonly known as: TYLENOL Take 1,000 mg by mouth every 6 (six) hours as needed for moderate pain or headache.   azithromycin 500 MG tablet Commonly known as: Zithromax Take 2 tablets (1,000 mg total) by mouth daily for 1 day, THEN 1 tablet (500 mg total) daily for 4 days. Take 1 tablet daily for 3 days.. Start taking on: July 20, 2021   BIOTIN PO Take 1 capsule by mouth daily with lunch.   buPROPion 300 MG 24 hr tablet Commonly known as: WELLBUTRIN XL Take 300 mg by mouth daily with lunch.   CALCIUM PO Take 1 tablet by mouth 2 (two) times daily. chewable   folic acid 1 MG tablet Commonly known as: FOLVITE Take 3 mg by mouth daily with lunch.   hydrOXYzine 25 MG capsule Commonly known as: VISTARIL Take 25 mg by mouth 2 (two) times daily as needed for anxiety.   IRON PO Take 1 tablet by mouth daily.   ivabradine 7.5 MG Tabs tablet Commonly known as: Corlanor Take 1 tablet (7.5 mg total) by mouth 2 (two) times daily with a meal.   Jardiance 25 MG Tabs tablet Generic  drug: empagliflozin Take 25 mg by mouth daily with lunch.   loperamide 2 MG capsule Commonly known as: IMODIUM Take 1 capsule (2 mg total) by mouth as needed for diarrhea or loose stools.   magnesium hydroxide 400 MG/5ML suspension Commonly known as: MILK OF MAGNESIA Take 30 mLs by mouth daily as needed for mild constipation.   methotrexate 2.5 MG tablet Commonly known as: RHEUMATREX Take 20 mg by mouth every Sunday. Caution:Chemotherapy. Protect from light.   midodrine 10 MG tablet Commonly known as: PROAMATINE Take 1 tablet (10 mg total) by mouth with breakfast, with lunch, and with evening meal.   multivitamin with minerals Tabs tablet Take 1 tablet by mouth daily  with lunch.   Ozempic (2 MG/DOSE) 8 MG/3ML Sopn Generic drug: Semaglutide (2 MG/DOSE) Inject 2 mg into the skin every Sunday.   pantoprazole 40 MG tablet Commonly known as: PROTONIX Take 40 mg by mouth daily as needed (acid reflux).   Potassium Chloride ER 20 MEQ Tbcr Take 20 mEq by mouth daily as needed. Take when you take demadex What changed:  when to take this reasons to take this additional instructions   sertraline 100 MG tablet Commonly known as: ZOLOFT Take 150 mg by mouth daily with lunch.   torsemide 20 MG tablet Commonly known as: DEMADEX Take 1 tablet (20 mg total) by mouth daily as needed. What changed:  when to take this reasons to take this   vitamin B-12 500 MCG tablet Commonly known as: CYANOCOBALAMIN Take 500 mcg by mouth daily with lunch.   Vitamin D (Ergocalciferol) 1.25 MG (50000 UNIT) Caps capsule Commonly known as: DRISDOL Take 50,000 Units by mouth every Tuesday.       No Known Allergies  Follow-up Information     Cantwell, Celeste C, PA-C. Go on 07/27/2021.   Specialty: Cardiology Why: Appointment at 2:45 PM. If this appointment does not work for you please call the office and reschedule. Contact information: [place] [address] [phone]         [name], MD. Schedule an appointment as soon as possible for a visit in 2 week(s).   Specialty: Internal Medicine Contact information: [place] [address] [phone]                  The results of significant diagnostics from this hospitalization (including imaging, microbiology, ancillary and laboratory) are listed below for reference.    Significant Diagnostic Studies: CT HEAD WO CONTRAST  Result Date: 07/17/2021 CLINICAL DATA:  Headache EXAM: CT HEAD WITHOUT CONTRAST TECHNIQUE: Contiguous axial images were obtained from the base of the skull through the vertex without intravenous contrast.  COMPARISON:  None. FINDINGS: Brain: There is no mass, hemorrhage or extra-axial collection. The size and configuration of the ventricles and extra-axial CSF spaces are normal. The brain parenchyma is normal, without acute or chronic infarction. Vascular: No abnormal hyperdensity of the major intracranial arteries or dural venous sinuses. No intracranial atherosclerosis. Skull: The visualized skull base, calvarium and extracranial soft tissues are normal. Sinuses/Orbits: No fluid levels or advanced mucosal thickening of the visualized paranasal sinuses. No mastoid or middle ear effusion. The orbits are normal. IMPRESSION: Normal head CT. Electronically Signed   By: Ulyses Jarred M.D.   On: 07/17/2021 22:26   CT Angio Chest Pulmonary Embolism (PE) W or WO Contrast  Result Date: 07/17/2021 CLINICAL DATA:  Headaches.  Shortness of breath.  Positive D-dimer. EXAM: CT  ANGIOGRAPHY CHEST WITH CONTRAST TECHNIQUE: Multidetector CT imaging of the chest was performed using the standard protocol during bolus administration of intravenous contrast. Multiplanar CT image reconstructions and MIPs were obtained to evaluate the vascular anatomy. CONTRAST:  12m OMNIPAQUE IOHEXOL 350 MG/ML SOLN COMPARISON:  04/19/2021 FINDINGS: Cardiovascular: Contrast injection is sufficient to demonstrate satisfactory opacification of the pulmonary arteries to the segmental level. There is no pulmonary embolus or evidence of right heart strain. The size of the main pulmonary artery is normal. Heart size is normal, with no pericardial effusion. Left ventricular enlargement. 2 cm thick pericardial effusion, unchanged. Mediastinum/Nodes: Level 6 lymph nodes measure up to 8 mm. No axillary adenopathy. Lungs/Pleura: Airways are patent. No pleural effusion, lobar consolidation, pneumothorax or pulmonary infarction. Upper Abdomen: Contrast bolus timing is not optimized for evaluation of the abdominal organs. The visualized portions of the organs of the  upper abdomen are normal. Musculoskeletal: No chest wall abnormality. No bony spinal canal stenosis. Review of the MIP images confirms the above findings. IMPRESSION: 1. No pulmonary embolus or acute aortic syndrome. 2. Unchanged appearance of 2 cm thick pericardial effusion. 3. Marked left ventricular enlargement. Electronically Signed   By: KUlyses JarredM.D.   On: 07/17/2021 22:32   CARDIAC CATHETERIZATION  Result Date: 07/12/2021 Images from the original result were not included. Left dominant circulation No coronary artery disease Well compensated nonischemic cardiomyopathy MNigel Mormon MD Pager: [phone]: [phone] DG Chest Port 1 View  Result Date: 07/16/2021 CLINICAL DATA:  Questionable sepsis. Feels bad, joint pain, headache. EXAM: PORTABLE CHEST 1 VIEW COMPARISON:  Chest x-ray 06/08/2021, CT angiography chest 11/18/2020 FINDINGS: Persistent cardiomegaly. The heart and mediastinal contours are unchanged. Aortic calcifications. Left cardiac pacemaker/defibrillator with two leads in similar position. No focal consolidation. No pulmonary edema. No pleural effusion. No pneumothorax. No acute osseous abnormality. IMPRESSION: No active disease. Electronically Signed   By: MIven FinnM.D.   On: 07/16/2021 17:53   PCV ECHOCARDIOGRAM LIMITED  Result Date: 06/26/2021 Echocardiogram 06/24/2021: Severely depressed LV systolic function with EF 25%. Left ventricle cavity is moderately dilated. Dilated cardiomyopathy. Moderate eccentric hypertrophy of the left ventricle. Doppler evidence of grade I (impaired) diastolic dysfunction, normal LAP. Left ventricle regional wall motion findings: Severe global hypokinesis. Basal inferolateral, Basal inferior, Mid inferolateral and Mid inferior akinesis. Calculated EF 25%. Right ventricle cavity is normal in size. Right ventricle pacemaker visualized. Normal right ventricular function. MV annulus is dilated. Moderate (Grade II) mitral  regurgitation. Pericardium is normal. Small posterior pericardial effusion. Compared to the study done on 04/29/2021, pericardial effusion now appears much less significant.  However the LVEF has reduced further from 35 to 40% to the present 20 to 25%. Regional wall motion abnormality new.   Microbiology: Recent Results (from the past 240 hour(s))  Urine Culture     Status: Abnormal   Collection Time: 07/16/21  5:24 PM   Specimen: Urine, Random  Result Value Ref Range Status   Specimen Description   Final    URINE, RANDOM [facility]formed at [facility] [address], [place] [address]   Special Requests   Final    NONE [facility] [address], [id number]   Culture MULTIPLE SPECIES PRESENT, SUGGEST RECOLLECTION (A)  Final   Report Status 07/18/2021 FINAL  Final  Resp Panel by RT-PCR (Flu A&B, Covid) Nasopharyngeal Swab     Status: None   Collection Time: 07/16/21  5:24 PM   Specimen: Nasopharyngeal Swab; Nasopharyngeal(NP) swabs in vial transport medium  Result Value Ref Range Status   SARS Coronavirus 2 by RT PCR NEGATIVE NEGATIVE Final    Comment: (NOTE) SARS-CoV-2 target nucleic acids are NOT DETECTED.  The SARS-CoV-2 RNA is generally detectable in upper respiratory specimens during the acute phase of infection. The lowest concentration of SARS-CoV-2 viral copies this assay can detect is 138 copies/mL. A negative result does not preclude SARS-Cov-2 infection and should not be used as the sole basis for treatment or other patient management decisions. A negative result may occur with  improper specimen collection/handling, submission of specimen other than nasopharyngeal swab, presence of viral mutation(s) within the areas targeted by this assay, and inadequate number of viral copies(<138 copies/mL). A negative result must be combined with clinical observations, patient history, and epidemiological information. The  expected result is Negative.  Fact Sheet for Patients:  EntrepreneurPulse.com.au  Fact Sheet for Healthcare Providers:  IncredibleEmployment.be  This test is no t yet approved or cleared by the [place] FDA and  has been authorized for detection and/or diagnosis of SARS-CoV-2 by FDA under an Emergency Use Authorization (EUA). This EUA will remain  in effect (meaning this test can be used) for the duration of the COVID-19 declaration under Section 564(b)(1) of the Act, 21 U.S.C.section 360bbb-3(b)(1), unless the authorization is terminated  or revoked sooner.       Influenza A by PCR NEGATIVE NEGATIVE Final   Influenza B by PCR NEGATIVE NEGATIVE Final    Comment: (NOTE) The Xpert Xpress SARS-CoV-2/FLU/RSV plus assay is intended as an aid in the diagnosis of influenza from Nasopharyngeal swab specimens and should not be used as a sole basis for treatment. Nasal washings and aspirates are unacceptable for Xpert Xpress SARS-CoV-2/FLU/RSV testing.  Fact Sheet for Patients: EntrepreneurPulse.com.au  Fact Sheet for Healthcare Providers: IncredibleEmployment.be  This test is not yet approved or cleared by the [place] FDA and has been authorized for detection and/or diagnosis of SARS-CoV-2 by FDA under an Emergency Use Authorization (EUA). This EUA will remain in effect (meaning this test can be used) for the duration of the COVID-19 declaration under Section 564(b)(1) of the Act, 21 U.S.C. section 360bbb-3(b)(1), unless the authorization is terminated or revoked.  Performed at [facility], [address] [place], [place] [address]   Blood Culture (routine x 2)     Status: None (Preliminary result)   Collection Time: 07/16/21  5:30 PM   Specimen: BLOOD RIGHT ARM  Result Value Ref Range Status   Specimen Description   Final    BLOOD RIGHT ARM BLOOD Performed at [facility],  [address] [place], [place] [address]    Special Requests   Final    Blood Culture adequate volume BOTTLES DRAWN AEROBIC AND ANAEROBIC Performed at [facility], [address] [place], [place] [address]    Culture   Final    NO GROWTH 3 DAYS Performed at [facility], [place] [address], [place], [place] [address]    Report Status PENDING  Incomplete  Blood Culture (routine x 2)     Status: None (Preliminary result)   Collection Time: 07/16/21  5:40 PM   Specimen: BLOOD LEFT ARM  Result Value Ref Range Status   Specimen Description   Final    BLOOD LEFT ARM BLOOD Performed at [facility], [address] [place], [place] [address]  Special Requests   Final    Blood Culture adequate volume BOTTLES DRAWN AEROBIC AND ANAEROBIC Performed at [facility], [address] [place], [place] [address]    Culture   Final    NO GROWTH 3 DAYS Performed at [facility], [place] [address], [place], [place] [address]    Report Status PENDING  Incomplete  Group A Strep by PCR     Status: None   Collection Time: 07/16/21  8:57 PM   Specimen: Throat; Sterile Swab  Result Value Ref Range Status   Group A Strep by PCR NOT DETECTED NOT DETECTED Final    Comment: Performed at [facility], [address] [place], [place] [address]  Gastrointestinal Panel by PCR , Stool     Status: Abnormal   Collection Time: 07/18/21 10:23 AM   Specimen: STOOL  Result Value Ref Range Status   Campylobacter species NOT DETECTED NOT DETECTED Final   Plesimonas shigelloides NOT DETECTED NOT DETECTED Final   Salmonella species DETECTED (A) NOT DETECTED Final    Comment: RESULT CALLED TO, READ BACK BY AND VERIFIED WITH: JENNIFER HEFF 07/19/21 1122 KLW    Yersinia enterocolitica NOT DETECTED NOT DETECTED Final   Vibrio species NOT DETECTED NOT DETECTED Final   Vibrio cholerae NOT DETECTED NOT DETECTED Final   Enteroaggregative E coli (EAEC) NOT DETECTED  NOT DETECTED Final   Enteropathogenic E coli (EPEC) NOT DETECTED NOT DETECTED Final   Enterotoxigenic E coli (ETEC) NOT DETECTED NOT DETECTED Final   Shiga like toxin producing E coli (STEC) NOT DETECTED NOT DETECTED Final   Shigella/Enteroinvasive E coli (EIEC) NOT DETECTED NOT DETECTED Final   Cryptosporidium NOT DETECTED NOT DETECTED Final   Cyclospora cayetanensis NOT DETECTED NOT DETECTED Final   Entamoeba histolytica NOT DETECTED NOT DETECTED Final   Giardia lamblia NOT DETECTED NOT DETECTED Final   Adenovirus F40/41 NOT DETECTED NOT DETECTED Final   Astrovirus NOT DETECTED NOT DETECTED Final   Norovirus GI/GII NOT DETECTED NOT DETECTED Final   Rotavirus A NOT DETECTED NOT DETECTED Final   Sapovirus (I, II, IV, and V) NOT DETECTED NOT DETECTED Final    Comment: Performed at [facility], [address] [place], [place] [address]  C Difficile Quick Screen w PCR reflex     Status: None   Collection Time: 07/18/21 10:23 AM   Specimen: STOOL  Result Value Ref Range Status   C Diff antigen NEGATIVE NEGATIVE Final   C Diff toxin NEGATIVE NEGATIVE Final   C Diff interpretation No C. difficile detected.  Final    Comment: Performed at [facility], [place] [address], [place], [place] [address]     Labs: Basic Metabolic Panel: Recent Labs  Lab 07/16/21 1724 07/17/21 1444 07/18/21 0325 07/19/21 0356  NA 135 137 137 141  K 3.4* 3.3* 3.9 4.5  CL 99 105 105 106  CO2 29 25 26 27   GLUCOSE 91 103* 83 84  BUN 12 13 11 9   CREATININE 1.05* 0.67 0.69 0.75  CALCIUM 9.1 8.9 8.9 9.1  MG  --  2.2  --  2.1   Liver Function Tests: Recent Labs  Lab 07/16/21 1724 07/17/21 1444 07/18/21 0325  AST 30 26 26   ALT 36 30 28  ALKPHOS 90 84 78  BILITOT 0.4 0.6 0.4  PROT 7.3 6.8 6.5  ALBUMIN 3.6 3.5 3.3*   No results for input(s): LIPASE, AMYLASE in the last 168 hours.  No results for input(s): AMMONIA in the last 168 hours. CBC: Recent Labs  Lab 07/16/21 1724  07/17/21 1444 07/18/21 0325 07/19/21 0356  WBC 9.8 7.1 6.4 8.2  NEUTROABS 8.4* 5.8  --   --   HGB 12.7 13.1 12.9 12.3  HCT 38.5 40.2 40.0 37.6  MCV 87.3 87.6 88.3 87.6  PLT 213 206 217 237   Cardiac Enzymes: No results for input(s): CKTOTAL, CKMB, CKMBINDEX, TROPONINI in the last 168 hours. BNP: BNP (last 3 results) Recent Labs    04/19/21 1641 07/18/21 1419  BNP 195.4* 514.6*    ProBNP (last 3 results) No results for input(s): PROBNP in the last 8760 hours.  CBG: Recent Labs  Lab 07/18/21 1229 07/18/21 1654 07/18/21 2021 07/19/21 0719 07/19/21 1206  GLUCAP 75 84 148* 83 79       Signed:  Irine Seal MD.  [facility] 07/19/2021, 3:22 PM

## 2021-07-21 LAB — CULTURE, BLOOD (ROUTINE X 2)
Culture: NO GROWTH
Culture: NO GROWTH
Special Requests: ADEQUATE
Special Requests: ADEQUATE

## 2021-07-26 NOTE — Progress Notes (Deleted)
Primary Physician/Referring:  [name], MD  Patient ID: Alicia Cook, female    DOB: 1966/04/22, 55 y.o.   MRN: [id number]  No chief complaint on file.   HPI:    Alicia Cook  is a 55 y.o. 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 Cardiac MRI performed in 2011 and cardiac PET scan in August 2017 does not appear to be consistent with cardiac sarcoidosis.  She had repeat PET scan performed at [facility] on 03/05/2020 again confirming this.  She also has uncontrolled diabetes, morbid obesity, mild sleep apnea  but not tolerating CPAP, systemic  hypertension.  Patient underwent laparoscopic gastric sleeve on 12/20/2020 and so far has lost about 60 pounds in weight.    Patient admitted to the hospital on 07/17/2021 - 07/19/2021 at which time she presented with dizziness and fatigue in the setting of hypertension.  Patient was treated for Salmonella gastroenteritis.  Given hypotension Entresto and metoprolol were discontinued and patient was discharged with midodrine 10 mg 3 times daily as well as Jardiance.  Patient now presents for follow-up.***  ***  Patient presents for 2-week follow-up of acute on chronic heart failure, pericardial effusion, and orthopnea.  Last office visit switch patient from Lasix to torsemide 20 mg daily and restart Entresto 24/26 mg twice daily.  Echocardiogram revealed severely depressed LVEF at 25% as well as new inferior and inferolateral wall motion abnormalities.  Patient reports orthopnea has resolved over the last 1 week with initiation of torsemide as well as Entresto.  Denies chest pain, dizziness, palpitations, syncope, near syncope.  Denies leg swelling, PND.   Past Medical History:  Diagnosis Date   Cataract    CHF (congestive heart failure) ([place])    Depression     Diabetes mellitus without complication ([place])    Encounter for adjustment of biventricular implantable cardioverter-defibrillator (ICD) 06/13/2019   Hypertension    ICD: Cardiac defibrillator in situ 01/13/2015   [employer] Bi-V ICD (Dynagen X4 CRT-D) 01/13/2015 at [facility] - MRI Compatible  Scheduled Remote ICD check 4.22.20: 1 SVT/8 secs, normal function. Battery longevity is 8 years. RA pacing is 1 %, RV pacing is 99 %, and LV pacing is 99 %.   Nonischemic cardiomyopathy ([place])    a. EF 35-40% by cath in 2011 with normal cors b. s/p ICD implantation in 12/2014 c. EF 25% by NST in 02/01/2016   Sarcoidosis    Sarcoidosis 02/08/2016   Past Surgical History:  Procedure Laterality Date   ABDOMINAL SURGERY  12/20/2020    laparoscopic sleeve gastrectomy   CARDIAC CATHETERIZATION N/A 02/08/2016   Procedure: Left Heart Cath and Coronary Angiography;  Surgeon: Jettie Booze, MD;  Location: [facility];  Service: Cardiovascular;  Laterality: N/A;   CATARACT EXTRACTION Right 12/04/2018   CESAREAN SECTION     CHOLECYSTECTOMY     INSERTION OF ICD     a. s/p dual-chamber [employer] ICD 12/2014   LAPAROSCOPIC GASTRIC SLEEVE RESECTION     PACEMAKER INSERTION     RIGHT/LEFT HEART CATH AND CORONARY ANGIOGRAPHY N/A 07/12/2021   Procedure: RIGHT/LEFT HEART CATH AND CORONARY ANGIOGRAPHY;  Surgeon: Nigel Mormon, MD;  Location: [facility];  Service: Cardiovascular;  Laterality: N/A;   TUBAL LIGATION     Family History  Problem Relation Age of Onset   Heart attack  Father        a. Initial onset in his 43's. S/p CABG   Heart failure Father     Social History   Tobacco Use   Smoking status: Former    Packs/day: 1.00    Years: 20.00    Pack years: 20.00    Types: Cigarettes    Quit date: 2016    Years since quitting: 6.7   Smokeless tobacco: Never  Substance Use Topics   Alcohol use: No    Alcohol/week: 0.0 standard drinks   Marital Status:  Single  ROS  Review of Systems  Constitutional: Positive for weight loss (intentional). Negative for malaise/fatigue.  Cardiovascular:  Positive for dyspnea on exertion (improved). Negative for chest pain, claudication, leg swelling, near-syncope, orthopnea (resolved), palpitations, paroxysmal nocturnal dyspnea and syncope.  Neurological:  Negative for dizziness.  Objective  Last menstrual period 04/17/2016.  Vitals with BMI 07/19/2021 07/19/2021 07/19/2021  Height - - -  Weight - - -  BMI - - -  Systolic [phone]  Diastolic 69 80 79  Pulse 97 107 92     Physical Exam Vitals reviewed.  Constitutional:      General: She is not in acute distress.    Appearance: She is well-developed.     Comments: Morbidly obese in no acute distress.  Neck:     Thyroid: No thyromegaly.     Vascular: No carotid bruit or JVD.  Cardiovascular:     Rate and Rhythm: Normal rate and regular rhythm.     Pulses: Intact distal pulses.          Carotid pulses are 2+ on the right side and 2+ on the left side.      Dorsalis pedis pulses are 2+ on the right side and 2+ on the left side.       Posterior tibial pulses are 2+ on the right side and 2+ on the left side.     Heart sounds: Normal heart sounds, S1 normal and S2 normal. No murmur heard.   No gallop.     Comments: No evidence of pulsus paradoxus on exam. Pulmonary:     Effort: Pulmonary effort is normal. No accessory muscle usage.     Breath sounds: Normal breath sounds. No wheezing or rales.  Abdominal:     Comments: Obese. Pannus present  Musculoskeletal:     Right lower leg: No edema.     Left lower leg: No edema.  Skin:    General: Skin is warm and dry.  Exam stable compared to previous   Laboratory examination:   CMP Latest Ref Rng & Units 07/19/2021 07/18/2021 07/17/2021  Glucose 70 - 99 mg/dL 84 83 103(H)  BUN 6 - 20 mg/dL _0 Creatinine 0.44 - 1.00 mg/dL 0.75 0.69 0.67  Sodium 135 - 145 mmol/L 141 137 137  Potassium 3.5 - 5.1  mmol/L 4.5 3.9 3.3(L)  Chloride 98 - 111 mmol/L 106 105 105  CO2 22 - 32 mmol/L _1 Calcium 8.9 - 10.3 mg/dL 9.1 8.9 8.9  Total Protein 6.5 - 8.1 g/dL - 6.5 6.8  Total Bilirubin 0.3 - 1.2 mg/dL - 0.4 0.6  Alkaline Phos 38 - 126 U/L - 78 84  AST 15 - 41 U/L - 26 26  ALT 0 - 44 U/L - 28 30   CBC Latest Ref Rng & Units 07/19/2021 07/18/2021 07/17/2021  WBC 4.0 - 10.5 K/uL 8.2 6.4 7.1  Hemoglobin 12.0 -  15.0 g/dL 12.3 12.9 13.1  Hematocrit 36.0 - 46.0 % 37.6 40.0 40.2  Platelets 150 - 400 K/uL 237 217 206   Lipid Panel     Component Value Date/Time   CHOL 165 02/09/2016 0700   TRIG 94 02/09/2016 0700   HDL 64 02/09/2016 0700   CHOLHDL 2.6 02/09/2016 0700   VLDL 19 02/09/2016 0700   LDLCALC 82 02/09/2016 0700   HEMOGLOBIN A1C Lab Results  Component Value Date   HGBA1C 6.0 (H) 07/17/2021   MPG 125.5 07/17/2021   TSH No results for input(s): TSH in the last 8760 hours.  External labs:  06/14/2021: Sodium 140, potassium 3.4, glucose 79, BUN 18, creatinine 1.04, GFR 64 BNP 233 TSH 0.48  12/22/2020: Hb 12.1/HCT 36.8, platelets 227. Sodium 136, potassium 3.8, BUN 7, creatinine 0.69, EGFR >90 mL.  Serum glucose 106 mg.  08/31/2020: A1c 7.6%  07/20/2020: Glucose 101, sodium 139, potassium 4.2, BUN 12, creatinine 0.75, EGFR greater than 90 Hemoglobin 13.0, hematocrit 39.8, platelets 250  03/04/2020: BUN 14, creatinine 0.8, serum glucose 140 mg, EGFR >60 mL.  Magnesium 1.6. Total cholesterol 124, triglycerides 129, HDL 55, LDL 63.  Non-HDL cholesterol 69. Hb 13.1/HCT 39.3, platelets 207.  Allergies  No Known Allergies   Medications Prior to Visit:   Outpatient Medications Prior to Visit  Medication Sig Dispense Refill   acetaminophen (TYLENOL) 500 MG tablet Take 1,000 mg by mouth every 6 (six) hours as needed for moderate pain or headache.     BIOTIN PO Take 1 capsule by mouth daily with lunch.     buPROPion (WELLBUTRIN XL) 300 MG 24 hr tablet Take 300 mg by mouth  daily with lunch.     CALCIUM PO Take 1 tablet by mouth 2 (two) times daily. chewable     Ferrous Sulfate (IRON PO) Take 1 tablet by mouth daily.     folic acid (FOLVITE) 1 MG tablet Take 3 mg by mouth daily with lunch.     hydrOXYzine (VISTARIL) 25 MG capsule Take 25 mg by mouth 2 (two) times daily as needed for anxiety.     ivabradine (CORLANOR) 7.5 MG TABS tablet Take 1 tablet (7.5 mg total) by mouth 2 (two) times daily with a meal. 60 tablet 3   JARDIANCE 25 MG TABS tablet Take 25 mg by mouth daily with lunch.     loperamide (IMODIUM) 2 MG capsule Take 1 capsule (2 mg total) by mouth as needed for diarrhea or loose stools. 20 capsule 0   magnesium hydroxide (MILK OF MAGNESIA) 400 MG/5ML suspension Take 30 mLs by mouth daily as needed for mild constipation.     methotrexate (RHEUMATREX) 2.5 MG tablet Take 20 mg by mouth every Sunday. Caution:Chemotherapy. Protect from light.     midodrine (PROAMATINE) 10 MG tablet Take 1 tablet (10 mg total) by mouth with breakfast, with lunch, and with evening meal. 90 tablet 1   Multiple Vitamin (MULTIVITAMIN WITH MINERALS) TABS tablet Take 1 tablet by mouth daily with lunch.     pantoprazole (PROTONIX) 40 MG tablet Take 40 mg by mouth daily as needed (acid reflux).     Potassium Chloride ER 20 MEQ TBCR Take 20 mEq by mouth daily as needed. Take when you take demadex     Semaglutide, 2 MG/DOSE, (OZEMPIC, 2 MG/DOSE,) 8 MG/3ML SOPN Inject 2 mg into the skin every Sunday.     sertraline (ZOLOFT) 100 MG tablet Take 150 mg by mouth daily with lunch.  torsemide (DEMADEX) 20 MG tablet Take 1 tablet (20 mg total) by mouth daily as needed.     vitamin B-12 (CYANOCOBALAMIN) 500 MCG tablet Take 500 mcg by mouth daily with lunch.     Vitamin D, Ergocalciferol, (DRISDOL) 1.25 MG (50000 UT) CAPS capsule Take 50,000 Units by mouth every Tuesday.     No facility-administered medications prior to visit.   Final Medications at End of Visit    No outpatient medications  have been marked as taking for the 07/27/21 encounter (Appointment) with Rayetta Pigg, Celeste C, PA-C.   Radiology:   DG Chest 2 View 04/19/2021 Stable cardiomegaly. Increased density posterior to the heart border at the lung base may represent atrial enlargement or increasing pericardial effusion.   CT Angio Chest PE W and/or Wo Contrast 04/19/2021:   1. No pulmonary embolus.  2. Pericardial effusion has progressed from prior abdominal CT 2 months ago, now moderate to large in size measuring up to 2.8 cm posterior to the left heart.  3. Chronic mild mediastinal adenopathy is similar to 2019 exam and likely related to sarcoidosis.  4. Mild prominence of the main pulmonary artery suggesting pulmonary arterial hypertension.   Chest x-ray 06/08/2021: 1. Interval increase in size of the cardiac silhouette which may  reflect increased cardiomegaly or increased size of the known  pericardial effusion. Consider further evaluation with cardiac echo.  2. Tiny bilateral pleural effusions with bibasilar predominant  interstitial and airspace opacities likely reflecting pulmonary  edema.   Cardiac Studies:   Nuclear stress test 02/01/2016: No significant ST segment changes or arrhythmias were noted during stress  Negative ETT for ischemia at workload achieved  No significant chest pain symptoms reported  No significant arrhythmia noted  nuclear images report to follow   Right heart catheterization and myocardial biopsy 07/17/2016: Mild pulmonary hypertension, mildly elevated pulmonary capillary wedge pressure, biopsy negative for sarcoid.  Cardiac PET sarcoidosis with nuclear medicine perfusion 03/05/2020: A myocardial SPECT scan was performed at rest and contains a large, severe perfusion defect involving the anterior, lateral and anterior lateral walls of the left ventricle, necessitating a FDG-PET viability study.  Physiologic distribution of radiotracer. No substantial accumulation of  radiotracer in the area of perfusion defect. Other PET findings: Few FDG avid bilateral axillary lymph nodes, index right axillary node measuring 1 cm (SUV max 3.5). Additionally, there are a few hypermetabolic superior mediastinal lymph nodes. Index right upper paratracheal lymph node shows SUV max 2.2. CT: Few small nodules are noted in right upper lobe, similar to 12/02/2019 CT exam. Subsegmental atelectasis/scarring in left lower lobe. Left chest wall cardiac rhythm maintenance device with distal leads terminating in right atrial appendage and right ventricle and coronary sinus. Small fat-containing of umbilical hernia.   Echocardiogram 04/29/2021:  Left ventricle cavity is normal in size. Moderate asymmetric hypertrophy  of the left ventricle, with posterior wall measuring 1.5 cm.Moderate  global hypokinesis with basal inferolateral akinesis. Moderately depressed  LV systolic function with EF 35-40% Doppler evidence of grade II (pseudonormal) diastolic dysfunction, elevated LAP.  Left atrial cavity is mildly dilated at 4 cm.  Tethering of posterior leaflet with moderate (grade III), posteriorly directed mitral regurgitation.  Moderate loculated pericardial effusion with clear fluids. There is no apparent hemodynamic significance.  Compared to previous study on 09/03/2020, pericardial effusion increased  from mild. Mitral regurgitation is new.   Echocardiogram 06/24/2021:  Severely depressed LV systolic function with EF 25%. Left ventricle cavity  is moderately dilated. Dilated cardiomyopathy. Moderate eccentric  hypertrophy of the left ventricle. Doppler evidence of grade I (impaired)  diastolic dysfunction, normal LAP. Left ventricle regional wall motion  findings: Severe global hypokinesis. Basal inferolateral, Basal inferior,  Mid inferolateral and Mid inferior akinesis. Calculated EF 25%.  Right ventricle cavity is normal in size. Right ventricle pacemaker  visualized. Normal right  ventricular function.  MV annulus is dilated. Moderate (Grade II) mitral regurgitation.  Pericardium is normal. Small posterior pericardial effusion.  Compared to the study done on 04/29/2021, pericardial effusion now appears  much less significant.  However the LVEF has reduced further from 35 to  40% to the present 20 to 25%. Regional wall motion abnormality new.  Right and left heart catheterization coronary angiography 07/12/2021: Right heart pressures: LV EDP is normal. RA: 0 mmHg RV: 19/0 mmHg PA: 21/3 mmHg, mPAP 10 mmHg PCW: 4 mmHg CO: 5.7 L/min CI: 2.7 L/min/m2  Conclusion:  Left dominant circulation No coronary artery disease Well compensated nonischemic cardiomyopathy   ICD   Scheduled Remote ICD check 10/05/2020:  There were 2 atrial high rate episodes detected. The longest lasted 47 seconds in duration. No EGMs available for review. There was a <1 % cumulative atrial arrhythmia burden. No VHR episodes.  Health trends (patient activity, heart rate variability, average heart rates) are stable. The review of the implantable cardiac monitoring data remains stable. Battery longevity is 6 years . RA pacing is 1 %, RV pacing is 98 %, and LV pacing is 98 %.  EKG   10/12/2020: AV paced rhythm at rate of 84 bpm.  Biventricular pacing detected.  Wide QRS complex.  No further analysis.    03/12/2020: Underlying sinus rhythm normal sinus rhythm at the rate of 83 bpm, ventricularly paced rhythm.  No further analysis.  Biventricular pacemaker detected.    09/15/2019: V paced rhythm, no further analysis.   Assessment   No diagnosis found.  No orders of the defined types were placed in this encounter.   There are no discontinued medications.   Recommendations:   Alicia Cook  is a 55 y.o. 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 Cardiac MRI performed in 2011 and cardiac PET scan in August 2017 does not appear to be consistent with cardiac sarcoidosis.  She had repeat PET scan performed at [facility] on 03/05/2020 again confirming this.  She also has uncontrolled diabetes, morbid obesity, mild  sleep apnea  but not tolerating CPAP, systemic  hypertension.  Patient underwent laparoscopic gastric sleeve on 12/20/2020 and so far has lost about 60 pounds in weight.    Patient admitted to the hospital on 07/17/2021 - 07/19/2021 at which time she presented with dizziness and fatigue in the setting of hypertension.  Patient was treated for Salmonella gastroenteritis.  Given hypotension Entresto and metoprolol were discontinued and patient was discharged with midodrine 10 mg 3 times daily as well as Jardiance.  Patient now presents for follow-up.***  ***  Patient presents for 2-week follow-up of acute on chronic heart failure, pericardial effusion, and orthopnea.  Last office visit switch patient from Lasix to torsemide 20 mg daily and restart Entresto 24/26 mg twice daily. Patient's symptoms of orthopnea have essentially resolved. Will reduce torsemide to 20 mg once daily, with additional doses as needed.   Reviewed and discussed with patient echocardiogram results above. Given further reduction of LVEF and new regional wall  motion abnormalities, as well as shortness of breath, recommend right and left heart cath.   The left and right heart catheterization procedure was explained to the patient in detail. The indication, alternatives, risks and benefits were reviewed. Complications including but not limited to bleeding, infection, acute kidney injury, blood transfusion, heart rhythm disturbances, contrast (dye) reaction, damage to the arteries or nerves in the legs or hands, cerebrovascular accident, myocardial infarction, need for emergent bypass surgery, blood clots in the legs, possible need for emergent  blood transfusion, and rarely death were reviewed and discussed with the patient. The patient voices understanding and wishes to proceed.  Follow up after heart catheterization.   Patient was seen in collaboration with Dr. Einar Gip and he is in agreement with plan.    Alethia Berthold, PA-C 07/26/2021, 9:35 AM Office: [phone]

## 2021-07-27 ENCOUNTER — Ambulatory Visit: Payer: Medicaid Other | Admitting: Student

## 2021-07-27 DIAGNOSIS — I5022 Chronic systolic (congestive) heart failure: Secondary | ICD-10-CM

## 2021-07-27 DIAGNOSIS — I428 Other cardiomyopathies: Secondary | ICD-10-CM

## 2021-07-28 ENCOUNTER — Other Ambulatory Visit: Payer: Self-pay

## 2021-07-28 ENCOUNTER — Encounter: Payer: Self-pay | Admitting: Student

## 2021-07-28 ENCOUNTER — Ambulatory Visit: Payer: Medicaid Other | Admitting: Student

## 2021-07-28 VITALS — Temp 97.4°F | Ht 67.0 in | Wt 220.0 lb

## 2021-07-28 DIAGNOSIS — I428 Other cardiomyopathies: Secondary | ICD-10-CM

## 2021-07-28 DIAGNOSIS — Z95810 Presence of automatic (implantable) cardiac defibrillator: Secondary | ICD-10-CM

## 2021-07-28 DIAGNOSIS — I951 Orthostatic hypotension: Secondary | ICD-10-CM

## 2021-07-28 DIAGNOSIS — I5022 Chronic systolic (congestive) heart failure: Secondary | ICD-10-CM

## 2021-07-28 MED ORDER — MIDODRINE HCL 10 MG PO TABS: 10.0000 mg | ORAL_TABLET | Freq: Three times a day (TID) | ORAL | 3 refills | Status: DC

## 2021-07-28 MED ORDER — DIGOXIN 125 MCG PO TABS: 0.1250 mg | ORAL_TABLET | Freq: Every day | ORAL | 3 refills | Status: DC

## 2021-07-28 NOTE — Patient Instructions (Addendum)
Start digoxin 0.125 mg once daily  Take torsemide 20 mg daily as needed

## 2021-07-28 NOTE — Progress Notes (Signed)
 Primary Physician/Referring:  Sundaragiri, Pranathi Rao, MD  Patient ID: Alicia Cook, female    DOB: 02/23/1966, 55 y.o.   MRN: [id number]  No chief complaint on file.   HPI:    Alicia Cook  is a 55 y.o. 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 Cardiac MRI performed in 2011 and cardiac PET scan in August 2017 does not appear to be consistent with cardiac sarcoidosis.  She had repeat PET scan performed at [facility] on 03/05/2020 again confirming this.  She also has uncontrolled diabetes, morbid obesity, mild sleep apnea  but not tolerating CPAP, systemic  hypertension.  Patient underwent laparoscopic gastric sleeve on 12/20/2020 and so far has lost about 60 pounds in weight.    Patient admitted to the hospital on 07/17/2021 - 07/19/2021 at which time she presented with dizziness and fatigue in the setting of hypertension.  Patient was treated for Salmonella gastroenteritis.  Given hypotension Entresto and metoprolol were discontinued and patient was discharged with midodrine 10 mg 3 times daily as well as Jardiance.  Patient now presents for follow-up.  Patient has also since discontinued Corlanor.  Patient states she is feeling improved since discharge from the hospital.  She has had no recurrence of dizziness, however she does continue to feel mildly fatigued.  Patient has been taking torsemide 20 mg daily since discharge from the hospital.  Denies chest pain, dizziness, palpitations, syncope, near syncope.  Denies leg swelling, PND.   Past Medical History:  Diagnosis Date   Cataract    CHF (congestive heart failure) (HCC)    Depression    Diabetes mellitus without complication (HCC)    Encounter for adjustment of biventricular implantable cardioverter-defibrillator (ICD) 06/13/2019   Hypertension    ICD:  Cardiac defibrillator in situ 01/13/2015   [employer] Bi-V ICD (Dynagen X4 CRT-D) 01/13/2015 at [facility] - MRI Compatible  Scheduled Remote ICD check 4.22.20: 1 SVT/8 secs, normal function. Battery longevity is 8 years. RA pacing is 1 %, RV pacing is 99 %, and LV pacing is 99 %.   Nonischemic cardiomyopathy (HCC)    a. EF 35-40% by cath in 2011 with normal cors b. s/p ICD implantation in 12/2014 c. EF 25% by NST in 02/01/2016   Sarcoidosis    Sarcoidosis 02/08/2016   Past Surgical History:  Procedure Laterality Date   ABDOMINAL SURGERY  12/20/2020    laparoscopic sleeve gastrectomy   CARDIAC CATHETERIZATION N/A 02/08/2016   Procedure: Left Heart Cath and Coronary Angiography;  Surgeon: Jayadeep S Varanasi, MD;  Location: [facility] INVASIVE CV [facility];  Service: Cardiovascular;  Laterality: N/A;   CATARACT EXTRACTION Right 12/04/2018   CESAREAN SECTION     CHOLECYSTECTOMY     INSERTION OF ICD     a. s/p dual-chamber [employer] ICD 12/2014   LAPAROSCOPIC GASTRIC SLEEVE RESECTION     PACEMAKER INSERTION     RIGHT/LEFT HEART CATH AND CORONARY ANGIOGRAPHY N/A 07/12/2021   Procedure: RIGHT/LEFT HEART CATH AND CORONARY ANGIOGRAPHY;  Surgeon: Patwardhan, Manish J, MD;  Location: [facility] INVASIVE CV LAB;  Service: Cardiovascular;  Laterality: N/A;   TUBAL LIGATION     Family History  Problem Relation Age of Onset   Heart attack Father        a. Initial onset in his 50's. S/p CABG   Heart failure Father     Social   History   Tobacco Use   Smoking status: Former    Packs/day: 1.00    Years: 20.00    Pack years: 20.00    Types: Cigarettes    Quit date: 2016    Years since quitting: 6.7   Smokeless tobacco: Never  Substance Use Topics   Alcohol use: No    Alcohol/week: 0.0 standard drinks   Marital Status: Single  ROS  Review of Systems  Constitutional: Positive for malaise/fatigue and weight loss (intentional).  Cardiovascular:  Positive for dyspnea on exertion (improved).  Negative for chest pain, claudication, leg swelling, near-syncope, orthopnea (resolved), palpitations, paroxysmal nocturnal dyspnea and syncope.  Neurological:  Negative for dizziness.  Objective  Temperature (!) 97.4 F (36.3 C), height _0  (1.702 m), weight 220 lb (99.8 kg), last menstrual period 04/17/2016, SpO2 99 %.  Vitals with BMI 07/28/2021 07/19/2021 07/19/2021  Height _1  - -  Weight 220 lbs - -  BMI 63.78 - -  Systolic - 588 502  Diastolic - 69 80  Pulse - 97 107   Orthostatic VS for the past 72 hrs (Last 3 readings):  Orthostatic BP Patient Position BP Location Cuff Size Orthostatic Pulse  07/28/21 1616 109/76 Standing Left Arm Large 111  07/28/21 1615 109/71 Sitting Left Arm Large 107  07/28/21 1614 108/75 Supine Left Arm Large 99     Physical Exam Vitals reviewed.  Constitutional:      General: She is not in acute distress.    Appearance: She is well-developed.     Comments: Morbidly obese in no acute distress. Patient appears fatigued  Neck:     Thyroid: No thyromegaly.     Vascular: No carotid bruit or JVD.  Cardiovascular:     Rate and Rhythm: Normal rate and regular rhythm.     Pulses: Intact distal pulses.          Carotid pulses are 2+ on the right side and 2+ on the left side.      Dorsalis pedis pulses are 2+ on the right side and 2+ on the left side.       Posterior tibial pulses are 2+ on the right side and 2+ on the left side.     Heart sounds: Normal heart sounds, S1 normal and S2 normal. No murmur heard.   No gallop.     Comments: No evidence of pulsus paradoxus on exam. Pulmonary:     Effort: Pulmonary effort is normal. No accessory muscle usage.     Breath sounds: Normal breath sounds. No wheezing or rales.  Abdominal:     Comments: Obese. Pannus present  Musculoskeletal:     Right lower leg: No edema.     Left lower leg: No edema.  Skin:    General: Skin is warm and dry.    Laboratory examination:   CMP Latest Ref Rng & Units  07/19/2021 07/18/2021 07/17/2021  Glucose 70 - 99 mg/dL 84 83 103(H)  BUN 6 - 20 mg/dL _2 Creatinine 0.44 - 1.00 mg/dL 0.75 0.69 0.67  Sodium 135 - 145 mmol/L 141 137 137  Potassium 3.5 - 5.1 mmol/L 4.5 3.9 3.3(L)  Chloride 98 - 111 mmol/L 106 105 105  CO2 22 - 32 mmol/L _3 Calcium 8.9 - 10.3 mg/dL 9.1 8.9 8.9  Total Protein 6.5 - 8.1 g/dL - 6.5 6.8  Total Bilirubin 0.3 - 1.2 mg/dL - 0.4 0.6  Alkaline Phos 38 - 126 U/L - 78 84  AST 15 - 41 U/L - 26 26  ALT 0 - 44 U/L - 28 30   CBC Latest Ref Rng & Units 07/19/2021 07/18/2021 07/17/2021  WBC 4.0 - 10.5 K/uL 8.2 6.4 7.1  Hemoglobin 12.0 - 15.0 g/dL 12.3 12.9 13.1  Hematocrit 36.0 - 46.0 % 37.6 40.0 40.2  Platelets 150 - 400 K/uL 237 217 206   Lipid Panel     Component Value Date/Time   CHOL 165 02/09/2016 0700   TRIG 94 02/09/2016 0700   HDL 64 02/09/2016 0700   CHOLHDL 2.6 02/09/2016 0700   VLDL 19 02/09/2016 0700   LDLCALC 82 02/09/2016 0700   HEMOGLOBIN A1C Lab Results  Component Value Date   HGBA1C 6.0 (H) 07/17/2021   MPG 125.5 07/17/2021   TSH No results for input(s): TSH in the last 8760 hours.  External labs:  06/14/2021: Sodium 140, potassium 3.4, glucose 79, BUN 18, creatinine 1.04, GFR 64 BNP 233 TSH 0.48  12/22/2020: Hb 12.1/HCT 36.8, platelets 227. Sodium 136, potassium 3.8, BUN 7, creatinine 0.69, EGFR >90 mL.  Serum glucose 106 mg.  08/31/2020: A1c 7.6%  07/20/2020: Glucose 101, sodium 139, potassium 4.2, BUN 12, creatinine 0.75, EGFR greater than 90 Hemoglobin 13.0, hematocrit 39.8, platelets 250  03/04/2020: BUN 14, creatinine 0.8, serum glucose 140 mg, EGFR >60 mL.  Magnesium 1.6. Total cholesterol 124, triglycerides 129, HDL 55, LDL 63.  Non-HDL cholesterol 69. Hb 13.1/HCT 39.3, platelets 207.  Allergies  No Known Allergies   Medications Prior to Visit:   Outpatient Medications Prior to Visit  Medication Sig Dispense Refill   acetaminophen (TYLENOL) 500 MG tablet Take 1,000 mg  by mouth every 6 (six) hours as needed for moderate pain or headache.     BIOTIN PO Take 1 capsule by mouth daily with lunch.     buPROPion (WELLBUTRIN XL) 150 MG 24 hr tablet Take 1 tablet by mouth daily.     CALCIUM PO Take 1 tablet by mouth 2 (two) times daily. chewable     Ferrous Sulfate (IRON PO) Take 1 tablet by mouth daily.     folic acid (FOLVITE) 1 MG tablet Take 3 mg by mouth daily with lunch.     hydrOXYzine (VISTARIL) 25 MG capsule Take 25 mg by mouth 2 (two) times daily as needed for anxiety.     JARDIANCE 25 MG TABS tablet Take 25 mg by mouth daily with lunch.     loperamide (IMODIUM) 2 MG capsule Take 1 capsule (2 mg total) by mouth as needed for diarrhea or loose stools. 20 capsule 0   magnesium hydroxide (MILK OF MAGNESIA) 400 MG/5ML suspension Take 30 mLs by mouth daily as needed for mild constipation.     methotrexate (RHEUMATREX) 2.5 MG tablet Take 20 mg by mouth every Sunday. Caution:Chemotherapy. Protect from light.     Multiple Vitamin (MULTIVITAMIN WITH MINERALS) TABS tablet Take 1 tablet by mouth daily with lunch.     pantoprazole (PROTONIX) 40 MG tablet Take 40 mg by mouth daily as needed (acid reflux).     Potassium Chloride ER 20 MEQ TBCR Take 20 mEq by mouth daily as needed. Take when you take demadex     Semaglutide, 2 MG/DOSE, (OZEMPIC, 2 MG/DOSE,) 8 MG/3ML SOPN Inject 2 mg into the skin every Sunday.     sertraline (ZOLOFT) 100 MG tablet Take 150 mg by mouth daily with lunch.     torsemide (DEMADEX) 20 MG tablet Take 1 tablet (20 mg total) by mouth  daily as needed.     vitamin B-12 (CYANOCOBALAMIN) 500 MCG tablet Take 500 mcg by mouth daily with lunch.     Vitamin D, Ergocalciferol, (DRISDOL) 1.25 MG (50000 UT) CAPS capsule Take 50,000 Units by mouth every Tuesday.     midodrine (PROAMATINE) 10 MG tablet Take 1 tablet (10 mg total) by mouth with breakfast, with lunch, and with evening meal. 90 tablet 1   buPROPion (WELLBUTRIN XL) 300 MG 24 hr tablet Take 300 mg by  mouth daily with lunch.     ivabradine (CORLANOR) 7.5 MG TABS tablet Take 1 tablet (7.5 mg total) by mouth 2 (two) times daily with a meal. (Patient not taking: Reported on 07/28/2021) 60 tablet 3   No facility-administered medications prior to visit.   Final Medications at End of Visit    Current Meds  Medication Sig   acetaminophen (TYLENOL) 500 MG tablet Take 1,000 mg by mouth every 6 (six) hours as needed for moderate pain or headache.   BIOTIN PO Take 1 capsule by mouth daily with lunch.   buPROPion (WELLBUTRIN XL) 150 MG 24 hr tablet Take 1 tablet by mouth daily.   CALCIUM PO Take 1 tablet by mouth 2 (two) times daily. chewable   digoxin (LANOXIN) 0.125 MG tablet Take 1 tablet (0.125 mg total) by mouth daily.   Ferrous Sulfate (IRON PO) Take 1 tablet by mouth daily.   folic acid (FOLVITE) 1 MG tablet Take 3 mg by mouth daily with lunch.   hydrOXYzine (VISTARIL) 25 MG capsule Take 25 mg by mouth 2 (two) times daily as needed for anxiety.   JARDIANCE 25 MG TABS tablet Take 25 mg by mouth daily with lunch.   loperamide (IMODIUM) 2 MG capsule Take 1 capsule (2 mg total) by mouth as needed for diarrhea or loose stools.   magnesium hydroxide (MILK OF MAGNESIA) 400 MG/5ML suspension Take 30 mLs by mouth daily as needed for mild constipation.   methotrexate (RHEUMATREX) 2.5 MG tablet Take 20 mg by mouth every Sunday. Caution:Chemotherapy. Protect from light.   Multiple Vitamin (MULTIVITAMIN WITH MINERALS) TABS tablet Take 1 tablet by mouth daily with lunch.   pantoprazole (PROTONIX) 40 MG tablet Take 40 mg by mouth daily as needed (acid reflux).   Potassium Chloride ER 20 MEQ TBCR Take 20 mEq by mouth daily as needed. Take when you take demadex   Semaglutide, 2 MG/DOSE, (OZEMPIC, 2 MG/DOSE,) 8 MG/3ML SOPN Inject 2 mg into the skin every Sunday.   sertraline (ZOLOFT) 100 MG tablet Take 150 mg by mouth daily with lunch.   torsemide (DEMADEX) 20 MG tablet Take 1 tablet (20 mg total) by mouth  daily as needed.   vitamin B-12 (CYANOCOBALAMIN) 500 MCG tablet Take 500 mcg by mouth daily with lunch.   Vitamin D, Ergocalciferol, (DRISDOL) 1.25 MG (50000 UT) CAPS capsule Take 50,000 Units by mouth every Tuesday.   [DISCONTINUED] midodrine (PROAMATINE) 10 MG tablet Take 1 tablet (10 mg total) by mouth with breakfast, with lunch, and with evening meal.   Radiology:   DG Chest 2 View 04/19/2021 Stable cardiomegaly. Increased density posterior to the heart border at the lung base may represent atrial enlargement or increasing pericardial effusion.   CT Angio Chest PE W and/or Wo Contrast 04/19/2021:   1. No pulmonary embolus.  2. Pericardial effusion has progressed from prior abdominal CT 2 months ago, now moderate to large in size measuring up to 2.8 cm posterior to the left heart.  3. Chronic mild  mediastinal adenopathy is similar to 2019 exam and likely related to sarcoidosis.  4. Mild prominence of the main pulmonary artery suggesting pulmonary arterial hypertension.   Chest x-ray 06/08/2021: 1. Interval increase in size of the cardiac silhouette which may  reflect increased cardiomegaly or increased size of the known  pericardial effusion. Consider further evaluation with cardiac echo.  2. Tiny bilateral pleural effusions with bibasilar predominant  interstitial and airspace opacities likely reflecting pulmonary  edema.   Cardiac Studies:   Nuclear stress test 02/01/2016: No significant ST segment changes or arrhythmias were noted during stress  Negative ETT for ischemia at workload achieved  No significant chest pain symptoms reported  No significant arrhythmia noted  nuclear images report to follow   Right heart catheterization and myocardial biopsy 07/17/2016: Mild pulmonary hypertension, mildly elevated pulmonary capillary wedge pressure, biopsy negative for sarcoid.  Cardiac PET sarcoidosis with nuclear medicine perfusion 03/05/2020: A myocardial SPECT scan was  performed at rest and contains a large, severe perfusion defect involving the anterior, lateral and anterior lateral walls of the left ventricle, necessitating a FDG-PET viability study.  Physiologic distribution of radiotracer. No substantial accumulation of radiotracer in the area of perfusion defect. Other PET findings: Few FDG avid bilateral axillary lymph nodes, index right axillary node measuring 1 cm (SUV max 3.5). Additionally, there are a few hypermetabolic superior mediastinal lymph nodes. Index right upper paratracheal lymph node shows SUV max 2.2. CT: Few small nodules are noted in right upper lobe, similar to 12/02/2019 CT exam. Subsegmental atelectasis/scarring in left lower lobe. Left chest wall cardiac rhythm maintenance device with distal leads terminating in right atrial appendage and right ventricle and coronary sinus. Small fat-containing of umbilical hernia.   Echocardiogram 04/29/2021:  Left ventricle cavity is normal in size. Moderate asymmetric hypertrophy  of the left ventricle, with posterior wall measuring 1.5 cm.Moderate  global hypokinesis with basal inferolateral akinesis. Moderately depressed  LV systolic function with EF 35-40% Doppler evidence of grade II (pseudonormal) diastolic dysfunction, elevated LAP.  Left atrial cavity is mildly dilated at 4 cm.  Tethering of posterior leaflet with moderate (grade III), posteriorly directed mitral regurgitation.  Moderate loculated pericardial effusion with clear fluids. There is no apparent hemodynamic significance.  Compared to previous study on 09/03/2020, pericardial effusion increased  from mild. Mitral regurgitation is new.   Echocardiogram 06/24/2021:  Severely depressed LV systolic function with EF 25%. Left ventricle cavity  is moderately dilated. Dilated cardiomyopathy. Moderate eccentric  hypertrophy of the left ventricle. Doppler evidence of grade I (impaired)  diastolic dysfunction, normal LAP. Left ventricle  regional wall motion  findings: Severe global hypokinesis. Basal inferolateral, Basal inferior,  Mid inferolateral and Mid inferior akinesis. Calculated EF 25%.  Right ventricle cavity is normal in size. Right ventricle pacemaker  visualized. Normal right ventricular function.  MV annulus is dilated. Moderate (Grade II) mitral regurgitation.  Pericardium is normal. Small posterior pericardial effusion.  Compared to the study done on 04/29/2021, pericardial effusion now appears  much less significant.  However the LVEF has reduced further from 35 to  40% to the present 20 to 25%. Regional wall motion abnormality new.  Right and left heart catheterization coronary angiography 07/12/2021: Right heart pressures: LV EDP is normal. RA: 0 mmHg RV: 19/0 mmHg PA: 21/3 mmHg, mPAP 10 mmHg PCW: 4 mmHg CO: 5.7 L/min CI: 2.7 L/min/m2  Conclusion:  Left dominant circulation No coronary artery disease Well compensated nonischemic cardiomyopathy   ICD   Scheduled Remote ICD check 10/05/2020:  There were 2 atrial high rate episodes detected. The longest lasted 47 seconds in duration. No EGMs available for review. There was a <1 % cumulative atrial arrhythmia burden. No VHR episodes.  Health trends (patient activity, heart rate variability, average heart rates) are stable. The review of the implantable cardiac monitoring data remains stable. Battery longevity is 6 years . RA pacing is 1 %, RV pacing is 98 %, and LV pacing is 98 %.  EKG   10/12/2020: AV paced rhythm at rate of 84 bpm.  Biventricular pacing detected.  Wide QRS complex.  No further analysis.    03/12/2020: Underlying sinus rhythm normal sinus rhythm at the rate of 83 bpm, ventricularly paced rhythm.  No further analysis.  Biventricular pacemaker detected.    09/15/2019: V paced rhythm, no further analysis.   Assessment     ICD-10-CM   1. Chronic systolic CHF (congestive heart failure) (HCC)  I50.22     2. Nonischemic  cardiomyopathy (HCC)  I42.8     3. ICD: [employer] Bi-V ICD (Dynagen X4 CRT-D) MRI compatible ICD in situ  Z95.810     4. Orthostatic hypotension  I95.1      Meds ordered this encounter  Medications   digoxin (LANOXIN) 0.125 MG tablet    Sig: Take 1 tablet (0.125 mg total) by mouth daily.    Dispense:  90 tablet    Refill:  3   midodrine (PROAMATINE) 10 MG tablet    Sig: Take 1 tablet (10 mg total) by mouth with breakfast, with lunch, and with evening meal.    Dispense:  270 tablet    Refill:  3    Medications Discontinued During This Encounter  Medication Reason   buPROPion (WELLBUTRIN XL) 300 MG 24 hr tablet Dose change   midodrine (PROAMATINE) 10 MG tablet Reorder   ivabradine (CORLANOR) 7.5 MG TABS tablet Patient has not taken in last 30 days    Recommendations:   Lulani Bour  is a 55 y.o. 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 Cardiac MRI performed in 2011 and cardiac PET scan in August 2017 does not appear to be consistent with cardiac sarcoidosis.  She had repeat PET scan performed at [facility] on 03/05/2020 again confirming this.  She also has uncontrolled diabetes, morbid obesity, mild  sleep apnea  but not tolerating CPAP, systemic  hypertension.  Patient underwent laparoscopic gastric sleeve on 12/20/2020 and so far has lost about 60 pounds in weight.    Patient admitted to the hospital on 07/17/2021 - 07/19/2021 at which time she presented with dizziness and fatigue in the setting of hypertension.  Patient was treated for Salmonella gastroenteritis.  Given hypotension Entresto and metoprolol were discontinued and patient was discharged with midodrine 10 mg 3 times daily as well as Jardiance.  Patient now presents for follow-up.  Since discharge patient has also discontinued Corlanor.  Her  blood pressure remains soft although symptoms have improved with midodrine 10 mg 3 times daily.  We will continue midodrine.  We will also initiate digoxin 0.125 mg daily.  We will consider discontinuation of Jardiance to improved blood pressure, however she has been started on this by her endocrinologist reach out to their office prior to discontinuing this medication.  Also advised patient to take torsemide only as needed instead of on a daily basis.  We will  plan to follow-up closely.  Follow-up in 4 weeks, with Dr. Einar Gip given complex cardiac history and severe LV systolic dysfunction.  Patient was seen in collaboration with Dr. Einar Gip and he is in agreement with the plan.    Alethia Berthold, PA-C 07/28/2021, 5:07 PM Office: [phone]

## 2021-08-16 ENCOUNTER — Other Ambulatory Visit: Payer: Self-pay

## 2021-08-16 ENCOUNTER — Emergency Department (HOSPITAL_BASED_OUTPATIENT_CLINIC_OR_DEPARTMENT_OTHER)
Admission: EM | Admit: 2021-08-16 | Discharge: 2021-08-16 | Disposition: A | Discharge status: Home / Self Care | Payer: Medicaid Other | Attending: Emergency Medicine | Admitting: Emergency Medicine

## 2021-08-16 ENCOUNTER — Encounter (HOSPITAL_BASED_OUTPATIENT_CLINIC_OR_DEPARTMENT_OTHER): Payer: Self-pay | Admitting: *Deleted

## 2021-08-16 DIAGNOSIS — E039 Hypothyroidism, unspecified: Secondary | ICD-10-CM | POA: Insufficient documentation

## 2021-08-16 DIAGNOSIS — E119 Type 2 diabetes mellitus without complications: Secondary | ICD-10-CM | POA: Diagnosis not present

## 2021-08-16 DIAGNOSIS — R109 Unspecified abdominal pain: Secondary | ICD-10-CM | POA: Insufficient documentation

## 2021-08-16 DIAGNOSIS — X58XXXA Exposure to other specified factors, initial encounter: Secondary | ICD-10-CM | POA: Diagnosis not present

## 2021-08-16 DIAGNOSIS — Z87891 Personal history of nicotine dependence: Secondary | ICD-10-CM | POA: Insufficient documentation

## 2021-08-16 DIAGNOSIS — I110 Hypertensive heart disease with heart failure: Secondary | ICD-10-CM | POA: Insufficient documentation

## 2021-08-16 DIAGNOSIS — I509 Heart failure, unspecified: Secondary | ICD-10-CM | POA: Diagnosis not present

## 2021-08-16 DIAGNOSIS — S34109A Unspecified injury to unspecified level of lumbar spinal cord, initial encounter: Secondary | ICD-10-CM | POA: Diagnosis present

## 2021-08-16 DIAGNOSIS — S39012A Strain of muscle, fascia and tendon of lower back, initial encounter: Secondary | ICD-10-CM | POA: Diagnosis not present

## 2021-08-16 DIAGNOSIS — Z79899 Other long term (current) drug therapy: Secondary | ICD-10-CM | POA: Diagnosis not present

## 2021-08-16 DIAGNOSIS — Z7984 Long term (current) use of oral hypoglycemic drugs: Secondary | ICD-10-CM | POA: Diagnosis not present

## 2021-08-16 LAB — URINALYSIS, MICROSCOPIC (REFLEX): RBC / HPF: NONE SEEN RBC/hpf (ref 0–5)

## 2021-08-16 LAB — URINALYSIS, ROUTINE W REFLEX MICROSCOPIC
Bilirubin Urine: NEGATIVE
Glucose, UA: >=500 mg/dL — AB
Hgb urine dipstick: NEGATIVE
Ketones, ur: NEGATIVE mg/dL
Leukocytes,Ua: NEGATIVE
Nitrite: NEGATIVE
Protein, ur: NEGATIVE mg/dL
Specific Gravity, Urine: 1.02 (ref 1.005–1.030)
pH: 6.5 (ref 5.0–8.0)

## 2021-08-16 MED ORDER — KETOROLAC TROMETHAMINE 30 MG/ML IJ SOLN
30.0000 mg | Freq: Once | INTRAMUSCULAR | Status: AC
  Administered 2021-08-16: 30 mg via INTRAMUSCULAR
  Filled 2021-08-16: qty 1

## 2021-08-16 MED ORDER — LIDOCAINE 5 % EX PTCH
1.0000 | MEDICATED_PATCH | CUTANEOUS | Status: DC
  Administered 2021-08-16: 1 via TRANSDERMAL
  Filled 2021-08-16: qty 1

## 2021-08-16 NOTE — ED Provider Notes (Signed)
[facility] EMERGENCY DEPARTMENT Provider Note   CSN: [id number] Arrival date & time: 08/16/21  1603     History Chief Complaint  Patient presents with   Back Pain    Alicia Cook is a 55 y.o. female.  55 year old female with past medical history of CHF, diabetes (on Jardiance), ICD, hypertension, presents with complaint of right flank pain x 1 week, seen by PCP 08/12/21 who did a urine which was unremarkable, scheduled for CT but has not been scheduled. Right lower back pain now worse, worse with movement, taking left over Norco and Tylenol without relief. Now feeling feverish and nauseous with irritation with voiding with urinary odor and urgency. Not sexually active. No changes in bowel habits, no vomiting.       Past Medical History:  Diagnosis Date   Cataract    CHF (congestive heart failure) (HCC)    Depression    Diabetes mellitus without complication (HCC)    Encounter for adjustment of biventricular implantable cardioverter-defibrillator (ICD) 06/13/2019   Hypertension    ICD: Cardiac defibrillator in situ 01/13/2015   [employer] Bi-V ICD (Dynagen X4 CRT-D) 01/13/2015 at [facility] - MRI Compatible  Scheduled Remote ICD check 4.22.20: 1 SVT/8 secs, normal function. Battery longevity is 8 years. RA pacing is 1 %, RV pacing is 99 %, and LV pacing is 99 %.   Nonischemic cardiomyopathy (HCC)    a. EF 35-40% by cath in 2011 with normal cors b. s/p ICD implantation in 12/2014 c. EF 25% by NST in 02/01/2016   Sarcoidosis    Sarcoidosis 02/08/2016    Patient Active Problem List   Diagnosis Date Noted   Salmonella gastroenteritis 07/19/2021   SIRS (systemic inflammatory response syndrome) (HCC) 07/18/2021   Depression 07/18/2021   Hypokalemia 07/18/2021   Headache 07/18/2021   Hypotension    Pericardial effusion    Fever 07/16/2021   Encounter for adjustment of biventricular implantable cardioverter-defibrillator (ICD) 06/13/2019   Palpitations  12/05/2018   OSA (obstructive sleep apnea) 11/04/2018   Lung disease 01/17/2017   RLS (restless legs syndrome) 01/17/2017   Gastroesophageal reflux disease without esophagitis 11/30/2016   Chest pain 02/08/2016   Sarcoidosis 02/08/2016   Benign essential HTN 02/08/2016   Nonischemic cardiomyopathy (HCC) 02/08/2016   DM (diabetes mellitus), type 2 (HCC) 02/08/2016   Chronic HFrEF (heart failure with reduced ejection fraction) (HCC) 02/08/2016   Acquired hypothyroidism 09/02/2015   Heart failure with reduced left ventricular function (HCC) 09/02/2015   Hyperlipidemia 09/02/2015   PSVT (paroxysmal supraventricular tachycardia) (HCC) 09/02/2015   ICD: [employer] Bi-V ICD (Dynagen X4 CRT-D) MRI compatible ICD in situ 01/13/2015    Past Surgical History:  Procedure Laterality Date   ABDOMINAL SURGERY  12/20/2020    laparoscopic sleeve gastrectomy   CARDIAC CATHETERIZATION N/A 02/08/2016   Procedure: Left Heart Cath and Coronary Angiography;  Surgeon: Corky Crafts, MD;  Location: [facility] INVASIVE CV LAB;  Service: Cardiovascular;  Laterality: N/A;   CATARACT EXTRACTION Right 12/04/2018   CESAREAN SECTION     CHOLECYSTECTOMY     INSERTION OF ICD     a. s/p dual-chamber [employer] ICD 12/2014   LAPAROSCOPIC GASTRIC SLEEVE RESECTION     PACEMAKER INSERTION     RIGHT/LEFT HEART CATH AND CORONARY ANGIOGRAPHY N/A 07/12/2021   Procedure: RIGHT/LEFT HEART CATH AND CORONARY ANGIOGRAPHY;  Surgeon: Elder Negus, MD;  Location: [facility] INVASIVE CV LAB;  Service: Cardiovascular;  Laterality: N/A;   TUBAL LIGATION  OB History   No obstetric history on file.     Family History  Problem Relation Age of Onset   Heart attack Father        a. Initial onset in his 20's. S/p CABG   Heart failure Father     Social History   Tobacco Use   Smoking status: Former    Packs/day: 1.00    Years: 20.00    Pack years: 20.00    Types: Cigarettes    Quit date: 2016    Years  since quitting: 6.8   Smokeless tobacco: Never  Vaping Use   Vaping Use: Never used  Substance Use Topics   Alcohol use: No    Alcohol/week: 0.0 standard drinks   Drug use: No    Home Medications Prior to Admission medications   Medication Sig Start Date End Date Taking? Authorizing Provider  acetaminophen (TYLENOL) 500 MG tablet Take 1,000 mg by mouth every 6 (six) hours as needed for moderate pain or headache.    [provider]  BIOTIN PO Take 1 capsule by mouth daily with lunch.    [provider]  buPROPion (WELLBUTRIN XL) 150 MG 24 hr tablet Take 1 tablet by mouth daily. 07/22/21   [provider]  CALCIUM PO Take 1 tablet by mouth 2 (two) times daily. chewable    [provider]  digoxin (LANOXIN) 0.125 MG tablet Take 1 tablet (0.125 mg total) by mouth daily. 07/28/21   Cantwell, Celeste C, PA-C  Ferrous Sulfate (IRON PO) Take 1 tablet by mouth daily.    [provider]  folic acid (FOLVITE) 1 MG tablet Take 3 mg by mouth daily with lunch.    [provider]  hydrOXYzine (VISTARIL) 25 MG capsule Take 25 mg by mouth 2 (two) times daily as needed for anxiety. 11/26/20   [provider]  JARDIANCE 25 MG TABS tablet Take 25 mg by mouth daily with lunch. 02/14/21   [provider]  loperamide (IMODIUM) 2 MG capsule Take 1 capsule (2 mg total) by mouth as needed for diarrhea or loose stools. 07/19/21   Rodolph Bong, MD  magnesium hydroxide (MILK OF MAGNESIA) 400 MG/5ML suspension Take 30 mLs by mouth daily as needed for mild constipation.    [provider]  methotrexate (RHEUMATREX) 2.5 MG tablet Take 20 mg by mouth every Sunday. Caution:Chemotherapy. Protect from light.    [provider]  midodrine (PROAMATINE) 10 MG tablet Take 1 tablet (10 mg total) by mouth with breakfast, with lunch, and with evening meal. 07/28/21   Cantwell, Celeste C, PA-C  Multiple Vitamin (MULTIVITAMIN WITH MINERALS) TABS  tablet Take 1 tablet by mouth daily with lunch.    [provider]  pantoprazole (PROTONIX) 40 MG tablet Take 40 mg by mouth daily as needed (acid reflux). 01/23/20   [provider]  Potassium Chloride ER 20 MEQ TBCR Take 20 mEq by mouth daily as needed. Take when you take demadex 07/19/21   Rodolph Bong, MD  Semaglutide, 2 MG/DOSE, (OZEMPIC, 2 MG/DOSE,) 8 MG/3ML SOPN Inject 2 mg into the skin every Sunday.    [provider]  sertraline (ZOLOFT) 100 MG tablet Take 150 mg by mouth daily with lunch.    [provider]  torsemide (DEMADEX) 20 MG tablet Take 1 tablet (20 mg total) by mouth daily as needed. 07/19/21 07/14/22  Rodolph Bong, MD  vitamin B-12 (CYANOCOBALAMIN) 500 MCG tablet Take 500 mcg by mouth daily  with lunch.    [provider]  Vitamin D, Ergocalciferol, (DRISDOL) 1.25 MG (50000 UT) CAPS capsule Take 50,000 Units by mouth every Tuesday. 09/20/18   [provider]    Allergies    Patient has no known allergies.  Review of Systems   Review of Systems  Constitutional:  Positive for fever.       Feverish- "felt hot today"  Respiratory:  Negative for shortness of breath.   Cardiovascular:  Negative for chest pain.  Gastrointestinal:  Positive for nausea. Negative for abdominal pain, constipation, diarrhea and vomiting.  Genitourinary:  Positive for dysuria, flank pain and urgency.  Musculoskeletal:  Positive for back pain.  Skin:  Negative for rash and wound.  Allergic/Immunologic: Positive for immunocompromised state.  Neurological:  Negative for weakness.  Hematological:  Does not bruise/bleed easily.  Psychiatric/Behavioral:  Negative for behavioral problems.   All other systems reviewed and are negative.  Physical Exam Updated Vital Signs BP 140/87 (BP Location: Left Arm)   Pulse 93   Temp 98.7 F (37.1 C) (Oral)   Resp 18   Ht 5\' 7"  (1.702 m)   Wt 99.8 kg   LMP 04/17/2016   SpO2 100%   BMI 34.46  kg/m   Physical Exam Vitals and nursing note reviewed.  Constitutional:      Appearance: Normal appearance. She is obese.  HENT:     Head: Normocephalic and atraumatic.     Mouth/Throat:     Mouth: Mucous membranes are moist.  Cardiovascular:     Rate and Rhythm: Normal rate and regular rhythm.     Heart sounds: Normal heart sounds.  Pulmonary:     Effort: Pulmonary effort is normal.     Breath sounds: Normal breath sounds.  Abdominal:     Tenderness: There is abdominal tenderness. There is no right CVA tenderness, left CVA tenderness, guarding or rebound. Negative signs include Damica Gravlin's sign.     Comments: Mild right side tenderness  Musculoskeletal:        General: Tenderness present.     Cervical back: No tenderness or bony tenderness.     Thoracic back: No tenderness or bony tenderness.     Lumbar back: Tenderness present. No bony tenderness.       Back:     Right lower leg: No edema.     Left lower leg: No edema.     Comments: Pain worse with ROM and palpation  Skin:    General: Skin is warm and dry.     Findings: No erythema or rash.  Neurological:     Mental Status: She is alert and oriented to person, place, and time.  Psychiatric:        Behavior: Behavior normal.    ED Results / Procedures / Treatments   Labs (all labs ordered are listed, but only abnormal results are displayed) Labs Reviewed  URINALYSIS, ROUTINE W REFLEX MICROSCOPIC - Abnormal; Notable for the following components:      Result Value   Color, Urine AMBER (*)    APPearance HAZY (*)    Glucose, UA >=500 (*)    All other components within normal limits  URINALYSIS, MICROSCOPIC (REFLEX) - Abnormal; Notable for the following components:   Bacteria, UA MANY (*)    All other components within normal limits    EKG None  Radiology No results found.  Procedures Procedures   Medications Ordered in ED Medications  ketorolac (TORADOL) 30 MG/ML injection 30 mg (has no administration  in  time range)  lidocaine (LIDODERM) 5 % 1 patch (has no administration in time range)    ED Course  I have reviewed the triage vital signs and the nursing notes.  Pertinent labs & imaging results that were available during my care of the patient were reviewed by me and considered in my medical decision making (see chart for details).  Clinical Course as of 08/16/21 1756  Tue Aug 16, 2021  1743 Leukocytes,Ua: NEGATIVE [LM]  [id number] 55 year old female with complaint of right lower back pain as above.  Patient is found to have very mild tenderness in right side of her abdomen without guarding or rebound.  Pain in her back is worse with movement, especially sitting up in bed as well as palpation of her right lower back without midline or bony tenderness.  Reviewed results with patient, urinalysis with glucose, expected due to her diabetes management, negative for protein, nitrates, leukocytes.  Does have bacteria present without other indicators of infection.  Offered lab work and CT scan today, patient has declined.  States that she would like to leave so that she can go to work, will follow up with her doctor regarding her CT scan that was ordered outpatient, agrees to return to the ER at any time for further work-up should she change her mind.  Plan is to apply Lidoderm patch, will give IM Toradol (creatinine reviewed from earlier this month and normal). [LM]    Clinical Course User Index [LM] Alden Hipp   MDM Rules/Calculators/A&P                           Final Clinical Impression(s) / ED Diagnoses Final diagnoses:  Strain of lumbar region, initial encounter    Rx / DC Orders ED Discharge Orders     None        Jeannie Fend, PA-C 08/16/21 1756    Tegeler, Canary Brim, MD 08/16/21 1756

## 2021-08-16 NOTE — Discharge Instructions (Addendum)
Call your doctor tomorrow to arrange follow-up.  Return to the emergency room at anytime for further work-up.

## 2021-08-16 NOTE — ED Triage Notes (Signed)
Back pain for a week. No injury. Her MD thinks she has a UTI or a stone.

## 2021-08-16 NOTE — ED Notes (Signed)
First contact with patient. Patient arrived via triage from home with complaints of right sided flank pain x 2 days. Patient states it has gotten increasingly worse x today. Denies n/v. Pt is A&OX 4. Respirations even/unlabored. Patient placed on monitor and call light within reach. Patient updated on plan of care. Will continue to monitor patient.

## 2021-08-19 ENCOUNTER — Telehealth: Payer: Self-pay

## 2021-08-22 NOTE — Telephone Encounter (Signed)
Please advise her to cut torsemide in half.

## 2021-08-23 NOTE — Telephone Encounter (Signed)
Called and spoke to pt, pt voiced understanding.

## 2021-08-28 ENCOUNTER — Other Ambulatory Visit: Payer: Self-pay | Admitting: Cardiology

## 2021-08-28 DIAGNOSIS — I5022 Chronic systolic (congestive) heart failure: Secondary | ICD-10-CM

## 2021-08-28 DIAGNOSIS — I428 Other cardiomyopathies: Secondary | ICD-10-CM

## 2021-08-30 ENCOUNTER — Ambulatory Visit: Payer: Medicaid Other | Admitting: Cardiology

## 2021-08-30 NOTE — Progress Notes (Deleted)
 Primary Physician/Referring:  Sundaragiri, Pranathi Rao, MD  Patient ID: Alicia Cook, female    DOB: 02/19/1966, 55 y.o.   MRN: [id number]  No chief complaint on file.   HPI:    Alicia Cook  is a 55 y.o. 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 Cardiac MRI performed in 2011 and cardiac PET scan in August 2017 does not appear to be consistent with cardiac sarcoidosis.  She had repeat PET scan performed at [facility] on 03/05/2020 again confirming this.  She also has uncontrolled diabetes, morbid obesity, mild sleep apnea  but not tolerating CPAP, systemic  hypertension.  Patient underwent laparoscopic gastric sleeve on 12/20/2020 and so far has lost about 60 pounds in weight.    Patient admitted to the hospital on 07/17/2021 - 07/19/2021 at which time she presented with dizziness and fatigue in the setting of hypertension.  Patient was treated for Salmonella gastroenteritis.  Given hypotension Entresto and metoprolol were discontinued and patient was discharged with midodrine 10 mg 3 times daily as well as Jardiance.  Patient now presents for follow-up.  Patient has also since discontinued Corlanor.  Patient states she is feeling improved since discharge from the hospital.  She has had no recurrence of dizziness, however she does continue to feel mildly fatigued.  Patient has been taking torsemide 20 mg daily since discharge from the hospital.  Denies chest pain, dizziness, palpitations, syncope, near syncope.  Denies leg swelling, PND.   Past Medical History:  Diagnosis Date   Cataract    CHF (congestive heart failure) (HCC)    Depression    Diabetes mellitus without complication (HCC)    Encounter for adjustment of biventricular implantable cardioverter-defibrillator (ICD) 06/13/2019   Hypertension    ICD:  Cardiac defibrillator in situ 01/13/2015   [employer] Bi-V ICD (Dynagen X4 CRT-D) 01/13/2015 at [facility] - MRI Compatible  Scheduled Remote ICD check 4.22.20: 1 SVT/8 secs, normal function. Battery longevity is 8 years. RA pacing is 1 %, RV pacing is 99 %, and LV pacing is 99 %.   Nonischemic cardiomyopathy (HCC)    a. EF 35-40% by cath in 2011 with normal cors b. s/p ICD implantation in 12/2014 c. EF 25% by NST in 02/01/2016   Sarcoidosis    Sarcoidosis 02/08/2016   Past Surgical History:  Procedure Laterality Date   ABDOMINAL SURGERY  12/20/2020    laparoscopic sleeve gastrectomy   CARDIAC CATHETERIZATION N/A 02/08/2016   Procedure: Left Heart Cath and Coronary Angiography;  Surgeon: Jayadeep S Varanasi, MD;  Location: [facility];  Service: Cardiovascular;  Laterality: N/A;   CATARACT EXTRACTION Right 12/04/2018   CESAREAN SECTION     CHOLECYSTECTOMY     INSERTION OF ICD     a. s/p dual-chamber [employer] ICD 12/2014   LAPAROSCOPIC GASTRIC SLEEVE RESECTION     PACEMAKER INSERTION     RIGHT/LEFT HEART CATH AND CORONARY ANGIOGRAPHY N/A 07/12/2021   Procedure: RIGHT/LEFT HEART CATH AND CORONARY ANGIOGRAPHY;  Surgeon: Patwardhan, Manish J, MD;  Location: [facility];  Service: Cardiovascular;  Laterality: N/A;   TUBAL LIGATION     Family History  Problem Relation Age of Onset   Heart attack Father        a. Initial onset in his 50's. S/p CABG   Heart failure Father     Social   History   Tobacco Use   Smoking status: Former    Packs/day: 1.00    Years: 20.00    Pack years: 20.00    Types: Cigarettes    Quit date: 2016    Years since quitting: 6.8   Smokeless tobacco: Never  Substance Use Topics   Alcohol use: No    Alcohol/week: 0.0 standard drinks   Marital Status: Single  ROS  Review of Systems  Constitutional: Positive for malaise/fatigue and weight loss (intentional).  Cardiovascular:  Positive for dyspnea on exertion (improved).  Negative for chest pain, claudication, leg swelling, near-syncope, orthopnea (resolved), palpitations, paroxysmal nocturnal dyspnea and syncope.  Neurological:  Negative for dizziness.  Objective  Last menstrual period 04/17/2016.  Vitals with BMI 08/16/2021 08/16/2021 08/16/2021  Height - - 5' 7"  Weight - - 220 lbs  BMI - - 34.45  Systolic 140 128 -  Diastolic 87 85 -  Pulse 93 93 -   No data found.    Physical Exam Vitals reviewed.  Constitutional:      General: She is not in acute distress.    Appearance: She is well-developed.     Comments: Morbidly obese in no acute distress. Patient appears fatigued  Neck:     Thyroid: No thyromegaly.     Vascular: No carotid bruit or JVD.  Cardiovascular:     Rate and Rhythm: Normal rate and regular rhythm.     Pulses: Intact distal pulses.          Carotid pulses are 2+ on the right side and 2+ on the left side.      Dorsalis pedis pulses are 2+ on the right side and 2+ on the left side.       Posterior tibial pulses are 2+ on the right side and 2+ on the left side.     Heart sounds: Normal heart sounds, S1 normal and S2 normal. No murmur heard.   No gallop.     Comments: No evidence of pulsus paradoxus on exam. Pulmonary:     Effort: Pulmonary effort is normal. No accessory muscle usage.     Breath sounds: Normal breath sounds. No wheezing or rales.  Abdominal:     Comments: Obese. Pannus present  Musculoskeletal:     Right lower leg: No edema.     Left lower leg: No edema.  Skin:    General: Skin is warm and dry.    Laboratory examination:   CMP Latest Ref Rng & Units 07/19/2021 07/18/2021 07/17/2021  Glucose 70 - 99 mg/dL 84 83 103(H)  BUN 6 - 20 mg/dL 9 11 13  Creatinine 0.44 - 1.00 mg/dL 0.75 0.69 0.67  Sodium 135 - 145 mmol/L 141 137 137  Potassium 3.5 - 5.1 mmol/L 4.5 3.9 3.3(L)  Chloride 98 - 111 mmol/L 106 105 105  CO2 22 - 32 mmol/L 27 26 25  Calcium 8.9 - 10.3 mg/dL 9.1 8.9 8.9  Total Protein 6.5 - 8.1 g/dL -  6.5 6.8  Total Bilirubin 0.3 - 1.2 mg/dL - 0.4 0.6  Alkaline Phos 38 - 126 U/L - 78 84  AST 15 - 41 U/L - 26 26  ALT 0 - 44 U/L - 28 30   CBC Latest Ref Rng & Units 07/19/2021 07/18/2021 07/17/2021  WBC 4.0 - 10.5 K/uL 8.2 6.4 7.1  Hemoglobin 12.0 - 15.0 g/dL 12.3 12.9 13.1  Hematocrit 36.0 - 46.0 % 37.6 40.0 40.2  Platelets 150 - 400 K/uL 237 217 206     Lipid Panel     Component Value Date/Time   CHOL 165 02/09/2016 0700   TRIG 94 02/09/2016 0700   HDL 64 02/09/2016 0700   CHOLHDL 2.6 02/09/2016 0700   VLDL 19 02/09/2016 0700   LDLCALC 82 02/09/2016 0700   HEMOGLOBIN A1C Lab Results  Component Value Date   HGBA1C 6.0 (H) 07/17/2021   MPG 125.5 07/17/2021   TSH No results for input(s): TSH in the last 8760 hours.  External labs:  06/14/2021: Sodium 140, potassium 3.4, glucose 79, BUN 18, creatinine 1.04, GFR 64 BNP 233 TSH 0.48  12/22/2020: Hb 12.1/HCT 36.8, platelets 227. Sodium 136, potassium 3.8, BUN 7, creatinine 0.69, EGFR >90 mL.  Serum glucose 106 mg.  08/31/2020: A1c 7.6%  07/20/2020: Glucose 101, sodium 139, potassium 4.2, BUN 12, creatinine 0.75, EGFR greater than 90 Hemoglobin 13.0, hematocrit 39.8, platelets 250  03/04/2020: BUN 14, creatinine 0.8, serum glucose 140 mg, EGFR >60 mL.  Magnesium 1.6. Total cholesterol 124, triglycerides 129, HDL 55, LDL 63.  Non-HDL cholesterol 69. Hb 13.1/HCT 39.3, platelets 207.  Allergies  No Known Allergies   Medications Prior to Visit:   Outpatient Medications Prior to Visit  Medication Sig Dispense Refill   acetaminophen (TYLENOL) 500 MG tablet Take 1,000 mg by mouth every 6 (six) hours as needed for moderate pain or headache.     BIOTIN PO Take 1 capsule by mouth daily with lunch.     buPROPion (WELLBUTRIN XL) 150 MG 24 hr tablet Take 1 tablet by mouth daily.     CALCIUM PO Take 1 tablet by mouth 2 (two) times daily. chewable     CORLANOR 7.5 MG TABS tablet TAKE 1 TABLET (7.5 MG TOTAL) BY MOUTH 2 (TWO) TIMES  DAILY WITH A MEAL. 60 tablet 3   digoxin (LANOXIN) 0.125 MG tablet Take 1 tablet (0.125 mg total) by mouth daily. 90 tablet 3   Ferrous Sulfate (IRON PO) Take 1 tablet by mouth daily.     folic acid (FOLVITE) 1 MG tablet Take 3 mg by mouth daily with lunch.     hydrOXYzine (VISTARIL) 25 MG capsule Take 25 mg by mouth 2 (two) times daily as needed for anxiety.     JARDIANCE 25 MG TABS tablet Take 25 mg by mouth daily with lunch.     loperamide (IMODIUM) 2 MG capsule Take 1 capsule (2 mg total) by mouth as needed for diarrhea or loose stools. 20 capsule 0   magnesium hydroxide (MILK OF MAGNESIA) 400 MG/5ML suspension Take 30 mLs by mouth daily as needed for mild constipation.     methotrexate (RHEUMATREX) 2.5 MG tablet Take 20 mg by mouth every Sunday. Caution:Chemotherapy. Protect from light.     midodrine (PROAMATINE) 10 MG tablet Take 1 tablet (10 mg total) by mouth with breakfast, with lunch, and with evening meal. 270 tablet 3   Multiple Vitamin (MULTIVITAMIN WITH MINERALS) TABS tablet Take 1 tablet by mouth daily with lunch.     pantoprazole (PROTONIX) 40 MG tablet Take 40 mg by mouth daily as needed (acid reflux).     Potassium Chloride ER 20 MEQ TBCR Take 20 mEq by mouth daily as needed. Take when you take demadex     Semaglutide, 2 MG/DOSE, (OZEMPIC, 2 MG/DOSE,) 8 MG/3ML SOPN Inject 2 mg into the skin every Sunday.     sertraline (ZOLOFT) 100 MG tablet Take 150 mg by mouth daily with lunch.     torsemide (DEMADEX) 20 MG tablet Take 1 tablet (20   mg total) by mouth daily as needed.     vitamin B-12 (CYANOCOBALAMIN) 500 MCG tablet Take 500 mcg by mouth daily with lunch.     Vitamin D, Ergocalciferol, (DRISDOL) 1.25 MG (50000 UT) CAPS capsule Take 50,000 Units by mouth every Tuesday.     No facility-administered medications prior to visit.   Final Medications at End of Visit    No outpatient medications have been marked as taking for the 08/30/21 encounter (Appointment) with Adrian Prows, MD.    Radiology:   DG Chest 2 View 04/19/2021 Stable cardiomegaly. Increased density posterior to the heart border at the lung base may represent atrial enlargement or increasing pericardial effusion.   CT Angio Chest PE W and/or Wo Contrast 04/19/2021:   1. No pulmonary embolus.  2. Pericardial effusion has progressed from prior abdominal CT 2 months ago, now moderate to large in size measuring up to 2.8 cm posterior to the left heart.  3. Chronic mild mediastinal adenopathy is similar to 2019 exam and likely related to sarcoidosis.  4. Mild prominence of the main pulmonary artery suggesting pulmonary arterial hypertension.   Chest x-ray 06/08/2021: 1. Interval increase in size of the cardiac silhouette which may  reflect increased cardiomegaly or increased size of the known  pericardial effusion. Consider further evaluation with cardiac echo.  2. Tiny bilateral pleural effusions with bibasilar predominant  interstitial and airspace opacities likely reflecting pulmonary  edema.   Cardiac Studies:   Nuclear stress test 02/01/2016: No significant ST segment changes or arrhythmias were noted during stress  Negative ETT for ischemia at workload achieved  No significant chest pain symptoms reported  No significant arrhythmia noted  nuclear images report to follow   Right heart catheterization and myocardial biopsy 07/17/2016: Mild pulmonary hypertension, mildly elevated pulmonary capillary wedge pressure, biopsy negative for sarcoid.  Cardiac PET sarcoidosis with nuclear medicine perfusion 03/05/2020: A myocardial SPECT scan was performed at rest and contains a large, severe perfusion defect involving the anterior, lateral and anterior lateral walls of the left ventricle, necessitating a FDG-PET viability study.  Physiologic distribution of radiotracer. No substantial accumulation of radiotracer in the area of perfusion defect. Other PET findings: Few FDG avid bilateral axillary lymph  nodes, index right axillary node measuring 1 cm (SUV max 3.5). Additionally, there are a few hypermetabolic superior mediastinal lymph nodes. Index right upper paratracheal lymph node shows SUV max 2.2. CT: Few small nodules are noted in right upper lobe, similar to 12/02/2019 CT exam. Subsegmental atelectasis/scarring in left lower lobe. Left chest wall cardiac rhythm maintenance device with distal leads terminating in right atrial appendage and right ventricle and coronary sinus. Small fat-containing of umbilical hernia.   Echocardiogram 06/24/2021:  Severely depressed LV systolic function with EF 25%. Left ventricle cavity  is moderately dilated. Dilated cardiomyopathy. Moderate eccentric  hypertrophy of the left ventricle. Doppler evidence of grade I (impaired)  diastolic dysfunction, normal LAP. Left ventricle regional wall motion  findings: Severe global hypokinesis. Basal inferolateral, Basal inferior,  Mid inferolateral and Mid inferior akinesis. Calculated EF 25%.  Right ventricle cavity is normal in size. Right ventricle pacemaker  visualized. Normal right ventricular function.  MV annulus is dilated. Moderate (Grade II) mitral regurgitation.  Pericardium is normal. Small posterior pericardial effusion.  Compared to the study done on 04/29/2021, pericardial effusion now appears  much less significant.  However the LVEF has reduced further from 35 to  40% to the present 20 to 25%. Regional wall motion abnormality new.  Right  and left heart catheterization coronary angiography 07/12/2021: Right heart pressures: LV EDP is normal. RA: 0 mmHg RV: 19/0 mmHg PA: 21/3 mmHg, mPAP 10 mmHg PCW: 4 mmHg CO: 5.7 L/min CI: 2.7 L/min/m2  Conclusion:  Left dominant circulation No coronary artery disease Well compensated nonischemic cardiomyopathy   ICD   Biventricular ICD transmission 07/12/2021: AP less than 1%, CRT 99%.  Longevity 5 years.  Lead impedance and threshold within normal limits.   There were 3 PMT episodes, though no high ventricular rate episodes, no therapy.  There is no heart failure monitor.  Scheduled Remote ICD check 10/05/2020:  There were 2 atrial high rate episodes detected. The longest lasted 47 seconds in duration. No EGMs available for review. There was a <1 % cumulative atrial arrhythmia burden. No VHR episodes.  Health trends (patient activity, heart rate variability, average heart rates) are stable. The review of the implantable cardiac monitoring data remains stable. Battery longevity is 6 years . RA pacing is 1 %, RV pacing is 98 %, and LV pacing is 98 %.  EKG   10/12/2020: AV paced rhythm at rate of 84 bpm.  Biventricular pacing detected.  Wide QRS complex.  No further analysis.    03/12/2020: Underlying sinus rhythm normal sinus rhythm at the rate of 83 bpm, ventricularly paced rhythm.  No further analysis.  Biventricular pacemaker detected.    09/15/2019: V paced rhythm, no further analysis.   Assessment     ICD-10-CM   1. Chronic systolic CHF (congestive heart failure) (HCC)  I50.22     2. Nonischemic cardiomyopathy (HCC)  I42.8     3. ICD: [employer] Bi-V ICD (Dynagen X4 CRT-D) MRI compatible ICD in situ  Z95.810     4. Orthostatic hypotension  I95.1      No orders of the defined types were placed in this encounter.   There are no discontinued medications.   Recommendations:   Makari Lichter  is a 55 y.o. 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 Cardiac MRI performed in 2011 and cardiac PET scan in August 2017 does not appear to be consistent with cardiac sarcoidosis.  She had repeat PET scan performed at [facility] on 03/05/2020 again confirming this.  She also has uncontrolled diabetes, morbid obesity, mild  sleep apnea  but not tolerating CPAP,  systemic  hypertension.  Patient underwent laparoscopic gastric sleeve on 12/20/2020 and so far has lost about 60 pounds in weight.    Patient admitted to the hospital on 07/17/2021 - 07/19/2021 at which time she presented with dizziness and fatigue in the setting of hypertension.  Patient was treated for Salmonella gastroenteritis.  Given hypotension Entresto and metoprolol were discontinued and patient was discharged with midodrine 10 mg 3 times daily as well as Jardiance.  Patient now presents for follow-up.  Since discharge patient has also discontinued Corlanor.  Her blood pressure remains soft although symptoms have improved with midodrine 10 mg 3 times daily.  We will continue midodrine.  We will also initiate digoxin 0.125 mg daily.  We will consider discontinuation of Jardiance to improved blood pressure, however she has been started on this by her endocrinologist reach out to their office prior to discontinuing this medication.  Also advised patient to take torsemide only as needed instead of on a daily basis.  We will plan to follow-up closely.    Follow-up in 4 weeks, with Dr. Ganji given complex cardiac history and severe LV systolic dysfunction.  Patient was seen in collaboration with Dr. Ganji and he is in agreement with the plan.    Jay Ganji, PA-C 08/30/2021, 2:07 PM Office: [phone] 

## 2021-09-05 ENCOUNTER — Telehealth: Payer: Self-pay | Admitting: Cardiology

## 2021-09-05 DIAGNOSIS — D8689 Sarcoidosis of other sites: Secondary | ICD-10-CM

## 2021-09-05 DIAGNOSIS — I428 Other cardiomyopathies: Secondary | ICD-10-CM

## 2021-09-05 DIAGNOSIS — I3139 Other pericardial effusion (noninflammatory): Secondary | ICD-10-CM

## 2021-09-05 DIAGNOSIS — I5022 Chronic systolic (congestive) heart failure: Secondary | ICD-10-CM

## 2021-09-05 NOTE — Telephone Encounter (Signed)
I discussed with Dr. Concepcion Living, rheumatology regarding patient's presentation, recent worsening LVEF which had improved to around 45% now back to 20-25%.  His suspicion is that we should rule out cardiac involvement from sarcoidosis, to repeat PET scan.  Patient has been reluctant in considering IGF therapy and also getting any further evaluation.  I called the patient and spoke to her personally.  She is willing to go through a PET scan.  Patient states that her blood pressure has now stabilized since being on midodrine.  If PET scan rules out cardiac involvement with sarcoidosis,?  Challenge her with steroid for extracardiac sarcoidosis.  I will send a copy to Dr. Erroll Luna.    ICD-10-CM   1. Chronic systolic CHF (congestive heart failure) (HCC)  I50.22     2. Nonischemic cardiomyopathy (HCC)  I42.8     3. Pericardial effusion  I31.39     4. 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

## 2021-09-16 ENCOUNTER — Other Ambulatory Visit: Payer: Medicaid Other

## 2021-10-05 ENCOUNTER — Emergency Department (HOSPITAL_BASED_OUTPATIENT_CLINIC_OR_DEPARTMENT_OTHER): Payer: Medicaid Other

## 2021-10-05 ENCOUNTER — Encounter (HOSPITAL_BASED_OUTPATIENT_CLINIC_OR_DEPARTMENT_OTHER): Payer: Self-pay

## 2021-10-05 ENCOUNTER — Other Ambulatory Visit: Payer: Self-pay

## 2021-10-05 ENCOUNTER — Emergency Department (HOSPITAL_BASED_OUTPATIENT_CLINIC_OR_DEPARTMENT_OTHER)
Admission: EM | Admit: 2021-10-05 | Discharge: 2021-10-05 | Disposition: A | Discharge status: Home / Self Care | Payer: Medicaid Other | Attending: Emergency Medicine | Admitting: Emergency Medicine

## 2021-10-05 DIAGNOSIS — Z7984 Long term (current) use of oral hypoglycemic drugs: Secondary | ICD-10-CM | POA: Diagnosis not present

## 2021-10-05 DIAGNOSIS — E119 Type 2 diabetes mellitus without complications: Secondary | ICD-10-CM | POA: Insufficient documentation

## 2021-10-05 DIAGNOSIS — R1310 Dysphagia, unspecified: Secondary | ICD-10-CM | POA: Diagnosis present

## 2021-10-05 DIAGNOSIS — I509 Heart failure, unspecified: Secondary | ICD-10-CM | POA: Insufficient documentation

## 2021-10-05 DIAGNOSIS — I110 Hypertensive heart disease with heart failure: Secondary | ICD-10-CM | POA: Insufficient documentation

## 2021-10-05 DIAGNOSIS — Z79899 Other long term (current) drug therapy: Secondary | ICD-10-CM | POA: Diagnosis not present

## 2021-10-05 DIAGNOSIS — Z87891 Personal history of nicotine dependence: Secondary | ICD-10-CM | POA: Insufficient documentation

## 2021-10-05 LAB — CBC WITH DIFFERENTIAL/PLATELET
Abs Immature Granulocytes: 0.05 10*3/uL (ref 0.00–0.07)
Basophils Absolute: 0 10*3/uL (ref 0.0–0.1)
Basophils Relative: 0 %
Eosinophils Absolute: 0.1 10*3/uL (ref 0.0–0.5)
Eosinophils Relative: 1 %
HCT: 42 % (ref 36.0–46.0)
Hemoglobin: 13.8 g/dL (ref 12.0–15.0)
Immature Granulocytes: 0 %
Lymphocytes Relative: 9 %
Lymphs Abs: 1.1 10*3/uL (ref 0.7–4.0)
MCH: 28.6 pg (ref 26.0–34.0)
MCHC: 32.9 g/dL (ref 30.0–36.0)
MCV: 87.1 fL (ref 80.0–100.0)
Monocytes Absolute: 0.9 10*3/uL (ref 0.1–1.0)
Monocytes Relative: 8 %
Neutro Abs: 9.2 10*3/uL — ABNORMAL HIGH (ref 1.7–7.7)
Neutrophils Relative %: 82 %
Platelets: 169 10*3/uL (ref 150–400)
RBC: 4.82 MIL/uL (ref 3.87–5.11)
RDW: 13.5 % (ref 11.5–15.5)
WBC: 11.4 10*3/uL — ABNORMAL HIGH (ref 4.0–10.5)
nRBC: 0 % (ref 0.0–0.2)

## 2021-10-05 LAB — GROUP A STREP BY PCR: Group A Strep by PCR: NOT DETECTED

## 2021-10-05 LAB — BASIC METABOLIC PANEL
Anion gap: 8 (ref 5–15)
BUN: 13 mg/dL (ref 6–20)
CO2: 26 mmol/L (ref 22–32)
Calcium: 9.2 mg/dL (ref 8.9–10.3)
Chloride: 106 mmol/L (ref 98–111)
Creatinine, Ser: 0.73 mg/dL (ref 0.44–1.00)
GFR, Estimated: >60 mL/min (ref >60)
Glucose, Bld: 101 mg/dL — ABNORMAL HIGH (ref 70–99)
Potassium: 3.4 mmol/L — ABNORMAL LOW (ref 3.5–5.1)
Sodium: 140 mmol/L (ref 135–145)

## 2021-10-05 MED ORDER — SODIUM CHLORIDE 0.9 % IV BOLUS: 1000.0000 mL | Freq: Once | INTRAVENOUS | Status: DC

## 2021-10-05 MED ORDER — SODIUM CHLORIDE 0.9 % IV BOLUS
500.0000 mL | Freq: Once | INTRAVENOUS | Status: AC
  Administered 2021-10-05: 500 mL via INTRAVENOUS

## 2021-10-05 NOTE — Discharge Instructions (Addendum)
You are seen in the emergency department for difficulty swallowing.  You had a chest x-ray that did not show any obvious abnormalities along with labs that were unremarkable.  I reviewed your case with Dr. Dulce Sellar from [facility] and he asked that you call the office today for an urgent appointment.  Please try to keep well-hydrated.  Return to the emergency department if any worsening or concerning symptoms

## 2021-10-05 NOTE — ED Provider Notes (Signed)
[facility] EMERGENCY DEPARTMENT Provider Note   CSN: [id number] Arrival date & time: 10/05/21  6295     History Chief Complaint  Patient presents with   Dysphagia    Alicia Cook is a 55 y.o. female.  She is here with a complaint of 3 days of feeling water and food getting stuck about midway down her esophagus.  She has not tried food because she is afraid will get stuck.  She is able to swallow water but she feels like it stuck and then has to work to get it to pass lower.  No prior history of same.  She saw her PCP who put in a GI referral for her.  She does not know when the referral is gone and has not heard from them yet.  No throat pain.  No fevers chills cough shortness of breath.  The history is provided by the patient.  Sore Throat This is a new problem. The current episode started more than 2 days ago. The problem occurs constantly. The problem has not changed since onset.Pertinent negatives include no chest pain, no abdominal pain, no headaches and no shortness of breath. The symptoms are aggravated by swallowing. Nothing relieves the symptoms. She has tried water for the symptoms. The treatment provided no relief.      Past Medical History:  Diagnosis Date   Cataract    CHF (congestive heart failure) (HCC)    Depression    Diabetes mellitus without complication (HCC)    Encounter for adjustment of biventricular implantable cardioverter-defibrillator (ICD) 06/13/2019   Hypertension    ICD: Cardiac defibrillator in situ 01/13/2015   [employer] Bi-V ICD (Dynagen X4 CRT-D) 01/13/2015 at [facility] - MRI Compatible  Scheduled Remote ICD check 4.22.20: 1 SVT/8 secs, normal function. Battery longevity is 8 years. RA pacing is 1 %, RV pacing is 99 %, and LV pacing is 99 %.   Nonischemic cardiomyopathy (HCC)    a. EF 35-40% by cath in 2011 with normal cors b. s/p ICD implantation in 12/2014 c. EF 25% by NST in 02/01/2016   Sarcoidosis    Sarcoidosis  02/08/2016    Patient Active Problem List   Diagnosis Date Noted   Salmonella gastroenteritis 07/19/2021   SIRS (systemic inflammatory response syndrome) (HCC) 07/18/2021   Depression 07/18/2021   Hypokalemia 07/18/2021   Headache 07/18/2021   Hypotension    Pericardial effusion    Fever 07/16/2021   Encounter for adjustment of biventricular implantable cardioverter-defibrillator (ICD) 06/13/2019   Palpitations 12/05/2018   OSA (obstructive sleep apnea) 11/04/2018   Lung disease 01/17/2017   RLS (restless legs syndrome) 01/17/2017   Gastroesophageal reflux disease without esophagitis 11/30/2016   Chest pain 02/08/2016   Sarcoidosis 02/08/2016   Benign essential HTN 02/08/2016   Nonischemic cardiomyopathy (HCC) 02/08/2016   DM (diabetes mellitus), type 2 (HCC) 02/08/2016   Chronic HFrEF (heart failure with reduced ejection fraction) (HCC) 02/08/2016   Acquired hypothyroidism 09/02/2015   Heart failure with reduced left ventricular function (HCC) 09/02/2015   Hyperlipidemia 09/02/2015   PSVT (paroxysmal supraventricular tachycardia) (HCC) 09/02/2015   ICD: [employer] Bi-V ICD (Dynagen X4 CRT-D) MRI compatible ICD in situ 01/13/2015    Past Surgical History:  Procedure Laterality Date   ABDOMINAL SURGERY  12/20/2020    laparoscopic sleeve gastrectomy   CARDIAC CATHETERIZATION N/A 02/08/2016   Procedure: Left Heart Cath and Coronary Angiography;  Surgeon: Corky Crafts, MD;  Location: [facility] INVASIVE CV LAB;  Service: Cardiovascular;  Laterality: N/A;   CATARACT EXTRACTION Right 12/04/2018   CESAREAN SECTION     CHOLECYSTECTOMY     INSERTION OF ICD     a. s/p dual-chamber [employer] ICD 12/2014   LAPAROSCOPIC GASTRIC SLEEVE RESECTION     PACEMAKER INSERTION     RIGHT/LEFT HEART CATH AND CORONARY ANGIOGRAPHY N/A 07/12/2021   Procedure: RIGHT/LEFT HEART CATH AND CORONARY ANGIOGRAPHY;  Surgeon: Elder Negus, MD;  Location: [facility] INVASIVE CV LAB;  Service:  Cardiovascular;  Laterality: N/A;   TUBAL LIGATION       OB History   No obstetric history on file.     Family History  Problem Relation Age of Onset   Heart attack Father        a. Initial onset in his 38's. S/p CABG   Heart failure Father     Social History   Tobacco Use   Smoking status: Former    Packs/day: 1.00    Years: 20.00    Pack years: 20.00    Types: Cigarettes    Quit date: 2016    Years since quitting: 6.9   Smokeless tobacco: Never  Vaping Use   Vaping Use: Never used  Substance Use Topics   Alcohol use: No    Alcohol/week: 0.0 standard drinks   Drug use: No    Home Medications Prior to Admission medications   Medication Sig Start Date End Date Taking? Authorizing Provider  acetaminophen (TYLENOL) 500 MG tablet Take 1,000 mg by mouth every 6 (six) hours as needed for moderate pain or headache.    [provider]  BIOTIN PO Take 1 capsule by mouth daily with lunch.    [provider]  buPROPion (WELLBUTRIN XL) 150 MG 24 hr tablet Take 1 tablet by mouth daily. 07/22/21   [provider]  CALCIUM PO Take 1 tablet by mouth 2 (two) times daily. chewable    [provider]  CORLANOR 7.5 MG TABS tablet TAKE 1 TABLET (7.5 MG TOTAL) BY MOUTH 2 (TWO) TIMES DAILY WITH A MEAL. 08/29/21   Yates Decamp, MD  digoxin (LANOXIN) 0.125 MG tablet Take 1 tablet (0.125 mg total) by mouth daily. 07/28/21   Cantwell, Celeste C, PA-C  Ferrous Sulfate (IRON PO) Take 1 tablet by mouth daily.    [provider]  folic acid (FOLVITE) 1 MG tablet Take 3 mg by mouth daily with lunch.    [provider]  hydrOXYzine (VISTARIL) 25 MG capsule Take 25 mg by mouth 2 (two) times daily as needed for anxiety. 11/26/20   [provider]  JARDIANCE 25 MG TABS tablet Take 25 mg by mouth daily with lunch. 02/14/21   [provider]  loperamide (IMODIUM) 2 MG capsule Take 1 capsule (2 mg total) by mouth as needed for diarrhea or  loose stools. 07/19/21   Rodolph Bong, MD  magnesium hydroxide (MILK OF MAGNESIA) 400 MG/5ML suspension Take 30 mLs by mouth daily as needed for mild constipation.    [provider]  methotrexate (RHEUMATREX) 2.5 MG tablet Take 20 mg by mouth every Sunday. Caution:Chemotherapy. Protect from light.    [provider]  midodrine (PROAMATINE) 10 MG tablet Take 1 tablet (10 mg total) by mouth with breakfast, with lunch, and with evening meal. 07/28/21   Cantwell, Celeste C, PA-C  Multiple Vitamin (MULTIVITAMIN WITH MINERALS) TABS tablet Take 1 tablet by mouth daily with lunch.    [provider]  pantoprazole (PROTONIX) 40 MG tablet Take  40 mg by mouth daily as needed (acid reflux). 01/23/20   [provider]  Potassium Chloride ER 20 MEQ TBCR Take 20 mEq by mouth daily as needed. Take when you take demadex 07/19/21   Rodolph Bong, MD  Semaglutide, 2 MG/DOSE, (OZEMPIC, 2 MG/DOSE,) 8 MG/3ML SOPN Inject 2 mg into the skin every Sunday.    [provider]  sertraline (ZOLOFT) 100 MG tablet Take 150 mg by mouth daily with lunch.    [provider]  torsemide (DEMADEX) 20 MG tablet Take 1 tablet (20 mg total) by mouth daily as needed. 07/19/21 07/14/22  Rodolph Bong, MD  vitamin B-12 (CYANOCOBALAMIN) 500 MCG tablet Take 500 mcg by mouth daily with lunch.    [provider]  Vitamin D, Ergocalciferol, (DRISDOL) 1.25 MG (50000 UT) CAPS capsule Take 50,000 Units by mouth every Tuesday. 09/20/18   [provider]    Allergies    Patient has no known allergies.  Review of Systems   Review of Systems  Constitutional:  Negative for fever.  HENT:  Positive for trouble swallowing. Negative for sore throat.   Eyes:  Negative for visual disturbance.  Respiratory:  Negative for shortness of breath.   Cardiovascular:  Negative for chest pain.  Gastrointestinal:  Negative for abdominal pain.  Genitourinary:  Negative for dysuria.   Musculoskeletal:  Negative for neck pain.  Skin:  Negative for rash.  Neurological:  Negative for headaches.   Physical Exam Updated Vital Signs BP 123/78 (BP Location: Left Arm)   Pulse 99   Temp 98.2 F (36.8 C) (Oral)   Resp 16   Ht 5\' 7"  (1.702 m)   Wt 96.2 kg   LMP 04/17/2016   SpO2 100%   BMI 33.20 kg/m   Physical Exam Vitals and nursing note reviewed.  Constitutional:      General: She is not in acute distress.    Appearance: Normal appearance. She is well-developed.  HENT:     Head: Normocephalic and atraumatic.  Eyes:     Conjunctiva/sclera: Conjunctivae normal.  Cardiovascular:     Rate and Rhythm: Normal rate and regular rhythm.     Heart sounds: No murmur heard. Pulmonary:     Effort: Pulmonary effort is normal. No respiratory distress.     Breath sounds: Normal breath sounds.  Abdominal:     Palpations: Abdomen is soft.     Tenderness: There is no abdominal tenderness.  Musculoskeletal:        General: No swelling.     Cervical back: Neck supple.  Skin:    General: Skin is warm and dry.     Capillary Refill: Capillary refill takes less than 2 seconds.  Neurological:     General: No focal deficit present.     Mental Status: She is alert.    ED Results / Procedures / Treatments   Labs (all labs ordered are listed, but only abnormal results are displayed) Labs Reviewed  BASIC METABOLIC PANEL - Abnormal; Notable for the following components:      Result Value   Potassium 3.4 (*)    Glucose, Bld 101 (*)    All other components within normal limits  CBC WITH DIFFERENTIAL/PLATELET - Abnormal; Notable for the following components:   WBC 11.4 (*)    Neutro Abs 9.2 (*)    All other components within normal limits  GROUP A STREP BY PCR    EKG None  Radiology DG Chest [place] 1 View  Result  Date: 10/05/2021 CLINICAL DATA:  Dysphagia. EXAM: PORTABLE CHEST 1 VIEW COMPARISON:  July 16, 2021. FINDINGS: Stable cardiomegaly. Left-sided defibrillator  is unchanged. Both lungs are clear. The visualized skeletal structures are unremarkable. IMPRESSION: No active disease. Electronically Signed   By: Lupita Raider M.D.   On: 10/05/2021 08:09    Procedures Procedures   Medications Ordered in ED Medications  sodium chloride 0.9 % bolus 500 mL (0 mLs Intravenous Stopped 10/05/21 1610)    ED Course  I have reviewed the triage vital signs and the nursing notes.  Pertinent labs & imaging results that were available during my care of the patient were reviewed by me and considered in my medical decision making (see chart for details).  Clinical Course as of 10/05/21 1723  Wed Oct 05, 2021  [id number] Chest x-ray interpreted by me as no acute infiltrates.  Awaiting radiology reading. [MB]  [phone] Discussed with Dr. Dulce Sellar from [facility].  He said she can call the office and they can get her in expedited. [MB]    Clinical Course User Index [MB] Terrilee Files, MD   MDM Rules/Calculators/A&P                          This patient complains of difficulty swallowing; this involves an extensive number of treatment Options and is a complaint that carries with it a high risk of complications and Morbidity. The differential includes esophagitis, stricture, web, less likely impacted food bolus  I ordered, reviewed and interpreted labs, which included CBC with mildly elevated white count normal hemoglobin, chemistries normal other than mildly low potassium, strep test negative I ordered medication IV fluids I ordered imaging studies which included chest x-ray and I independently    visualized and interpreted imaging which showed no acute findings Previous records obtained and reviewed in epic, no recent admissions I consulted Dr. Hinda Lenis GI and discussed lab and imaging findings  Critical Interventions: None  After the interventions stated above, I reevaluated the patient and found patient to be tolerating p.o. here.  Still unwilling to try food.   Recommended reaching out to [facility] today to get urgent follow-up for probable endoscopy.  Patient agreeable to plan.  Return instructions discussed   Final Clinical Impression(s) / ED Diagnoses Final diagnoses:  Dysphagia, unspecified type    Rx / DC Orders ED Discharge Orders     None        Terrilee Files, MD 10/05/21 1725

## 2021-10-05 NOTE — ED Triage Notes (Addendum)
Pt reports that she feels like food and liquid get stuck in her throat for the last 4 days. PCP sent her here. Pt was given carafate without relief

## 2021-11-04 ENCOUNTER — Other Ambulatory Visit: Payer: Self-pay

## 2021-11-04 ENCOUNTER — Telehealth: Payer: Self-pay

## 2021-11-04 ENCOUNTER — Ambulatory Visit: Payer: Medicaid Other | Admitting: Student

## 2021-11-04 MED ORDER — MIDODRINE HCL 10 MG PO TABS: 10.0000 mg | ORAL_TABLET | Freq: Three times a day (TID) | ORAL | 0 refills | Status: DC

## 2021-11-10 ENCOUNTER — Ambulatory Visit: Payer: Medicaid Other | Admitting: Student

## 2021-11-10 ENCOUNTER — Encounter: Payer: Self-pay | Admitting: Student

## 2021-11-10 ENCOUNTER — Other Ambulatory Visit: Payer: Self-pay

## 2021-11-10 VITALS — BP 128/77 | HR 106 | Resp 17 | Ht 67.0 in | Wt 207.6 lb

## 2021-11-10 DIAGNOSIS — Z95810 Presence of automatic (implantable) cardiac defibrillator: Secondary | ICD-10-CM

## 2021-11-10 DIAGNOSIS — I951 Orthostatic hypotension: Secondary | ICD-10-CM

## 2021-11-10 DIAGNOSIS — I428 Other cardiomyopathies: Secondary | ICD-10-CM

## 2021-11-10 DIAGNOSIS — I5022 Chronic systolic (congestive) heart failure: Secondary | ICD-10-CM

## 2021-11-10 NOTE — Progress Notes (Signed)
Primary Physician/Referring:  [name], MD  Patient ID: Alicia Cook, female    DOB: 04-Jul-1966, 56 y.o.   MRN: [id number]  Chief Complaint  Patient presents with   Follow-up    6 month   Cardiomyopathy   Chronic systolic CHF     HPI:    Alicia Cook  is a 56 y.o. 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 Cardiac MRI performed in 2011 and cardiac PET scan in August 2017 does not appear to be consistent with cardiac sarcoidosis.  She had repeat PET scan performed at [facility] on 03/05/2020 again confirming this.  She also has uncontrolled diabetes, morbid obesity, mild sleep apnea  but not tolerating CPAP, systemic  hypertension.  Patient underwent laparoscopic gastric sleeve on 12/20/2020 and so far has lost about 60 pounds in weight.    Patient admitted to the hospital on 07/17/2021 - 07/19/2021 at which time she presented with dizziness and fatigue in the setting of hypertension.  Patient was treated for Salmonella gastroenteritis.  Given hypotension Entresto and metoprolol were discontinued and patient was discharged with midodrine 10 mg 3 times daily as well as Jardiance.  At outpatient follow-up started patient on digoxin.  Since last office visit patient's rheumatologist contacted Dr. Einar Gip and shared decision was to have PET scan done to evaluate for underlying cardiac sarcoidosis.  PET scan showed no evidence of cardiac sarcoidosis.  She now presents for 4-week follow-up.  Patient continues to do well.  Taking torsemide half a tablet once daily.  She did have COVID-19 infection approximately 2 weeks ago for which she was treated with antiviral medication and symptoms resolved in 3 days.  Patient describes symptoms as "a bad cold". Denies chest pain, dizziness, palpitations, syncope, near  syncope.  Denies leg swelling, PND.   Past Medical History:  Diagnosis Date   Cataract    CHF (congestive heart failure) ([place])    Depression    Diabetes mellitus without complication ([place])    Encounter for adjustment of biventricular implantable cardioverter-defibrillator (ICD) 06/13/2019   Hypertension    ICD: Cardiac defibrillator in situ 01/13/2015   [employer] Bi-V ICD (Dynagen X4 CRT-D) 01/13/2015 at [facility] - MRI Compatible  Scheduled Remote ICD check 4.22.20: 1 SVT/8 secs, normal function. Battery longevity is 8 years. RA pacing is 1 %, RV pacing is 99 %, and LV pacing is 99 %.   Nonischemic cardiomyopathy ([place])    a. EF 35-40% by cath in 2011 with normal cors b. s/p ICD implantation in 12/2014 c. EF 25% by NST in 02/01/2016   Sarcoidosis    Sarcoidosis 02/08/2016   Past Surgical History:  Procedure Laterality Date   ABDOMINAL SURGERY  12/20/2020    laparoscopic sleeve gastrectomy   CARDIAC CATHETERIZATION N/A 02/08/2016   Procedure: Left Heart Cath and Coronary Angiography;  Surgeon: Jettie Booze, MD;  Location: [place] [facility];  Service: Cardiovascular;  Laterality: N/A;   CATARACT EXTRACTION Right 12/04/2018   CESAREAN SECTION     CHOLECYSTECTOMY     INSERTION OF ICD     a. s/p dual-chamber [employer] ICD 12/2014   LAPAROSCOPIC GASTRIC SLEEVE RESECTION     PACEMAKER INSERTION     RIGHT/LEFT HEART CATH AND CORONARY ANGIOGRAPHY N/A 07/12/2021   Procedure: RIGHT/LEFT HEART CATH AND CORONARY ANGIOGRAPHY;  Surgeon: Nigel Mormon, MD;  Location: [place] [facility];  Service: Cardiovascular;  Laterality: N/A;   TUBAL LIGATION     Family History  Problem Relation Age of Onset   Heart attack Father        a. Initial onset in his 62's. S/p CABG   Heart failure Father     Social History   Tobacco Use   Smoking status: Former    Packs/day: 1.00    Years: 20.00    Pack years: 20.00    Types: Cigarettes    Quit date: 2016    Years since  quitting: 7.0   Smokeless tobacco: Never  Substance Use Topics   Alcohol use: No    Alcohol/week: 0.0 standard drinks   Marital Status: Single  ROS  Review of Systems  Constitutional: Positive for weight loss (intentional). Negative for malaise/fatigue.  Cardiovascular:  Negative for chest pain, claudication, dyspnea on exertion (improved), leg swelling, near-syncope, orthopnea (resolved), palpitations, paroxysmal nocturnal dyspnea and syncope.  Neurological:  Negative for dizziness.  Objective  Blood pressure 128/77, pulse (!) 106, resp. rate 17, height 5' 7"  (1.702 m), weight 207 lb 9.6 oz (94.2 kg), last menstrual period 04/17/2016, SpO2 99 %.  Vitals with BMI 11/10/2021 10/05/2021 10/05/2021  Height 5' 7"  - 5' 7"   Weight 207 lbs 10 oz - 212 lbs  BMI 48.18 - 56.3  Systolic 149 702 637  Diastolic 77 72 78  Pulse 858 83 99   Orthostatic VS for the past 72 hrs (Last 3 readings):  Patient Position BP Location Cuff Size  11/10/21 1513 Sitting Left Arm Normal     Physical Exam Vitals reviewed.  Constitutional:      General: She is not in acute distress.    Appearance: She is well-developed.     Comments: Morbidly obese in no acute distress. Patient appears fatigued  Neck:     Thyroid: No thyromegaly.     Vascular: No carotid bruit or JVD.  Cardiovascular:     Rate and Rhythm: Regular rhythm. Tachycardia present.     Pulses: Intact distal pulses.          Carotid pulses are 2+ on the right side and 2+ on the left side.      Dorsalis pedis pulses are 2+ on the right side and 2+ on the left side.       Posterior tibial pulses are 2+ on the right side and 2+ on the left side.     Heart sounds: Normal heart sounds, S1 normal and S2 normal. No murmur heard.   No gallop.  Pulmonary:     Effort: Pulmonary effort is normal. No accessory muscle usage.     Breath sounds: Normal breath sounds. No wheezing or rales.  Abdominal:     Comments: Obese. Pannus present  Musculoskeletal:      Right lower leg: No edema.     Left lower leg: No edema.  Skin:    General: Skin is warm and dry.    Laboratory examination:   CMP Latest Ref Rng & Units 10/05/2021 07/19/2021 07/18/2021  Glucose 70 - 99 mg/dL 101(H) 84 83  BUN 6 - 20 mg/dL 13 9 11   Creatinine 0.44 - 1.00 mg/dL 0.73 0.75 0.69  Sodium 135 - 145 mmol/L 140 141 137  Potassium 3.5 - 5.1 mmol/L 3.4(L) 4.5 3.9  Chloride 98 - 111 mmol/L 106 106 105  CO2 22 - 32 mmol/L 26 27 26   Calcium 8.9 - 10.3 mg/dL 9.2 9.1 8.9  Total Protein 6.5 - 8.1 g/dL - - 6.5  Total Bilirubin 0.3 - 1.2 mg/dL - - 0.4  Alkaline Phos 38 - 126 U/L - - 78  AST 15 - 41 U/L - - 26  ALT 0 - 44 U/L - - 28   CBC Latest Ref Rng & Units 10/05/2021 07/19/2021 07/18/2021  WBC 4.0 - 10.5 K/uL 11.4(H) 8.2 6.4  Hemoglobin 12.0 - 15.0 g/dL 13.8 12.3 12.9  Hematocrit 36.0 - 46.0 % 42.0 37.6 40.0  Platelets 150 - 400 K/uL 169 237 217   Lipid Panel     Component Value Date/Time   CHOL 165 02/09/2016 0700   TRIG 94 02/09/2016 0700   HDL 64 02/09/2016 0700   CHOLHDL 2.6 02/09/2016 0700   VLDL 19 02/09/2016 0700   LDLCALC 82 02/09/2016 0700   HEMOGLOBIN A1C Lab Results  Component Value Date   HGBA1C 6.0 (H) 07/17/2021   MPG 125.5 07/17/2021   TSH No results for input(s): TSH in the last 8760 hours.  External labs:  06/14/2021: Sodium 140, potassium 3.4, glucose 79, BUN 18, creatinine 1.04, GFR 64 BNP 233 TSH 0.48  12/22/2020: Hb 12.1/HCT 36.8, platelets 227. Sodium 136, potassium 3.8, BUN 7, creatinine 0.69, EGFR >90 mL.  Serum glucose 106 mg.  08/31/2020: A1c 7.6%  07/20/2020: Glucose 101, sodium 139, potassium 4.2, BUN 12, creatinine 0.75, EGFR greater than 90 Hemoglobin 13.0, hematocrit 39.8, platelets 250  03/04/2020: BUN 14, creatinine 0.8, serum glucose 140 mg, EGFR >60 mL.  Magnesium 1.6. Total cholesterol 124, triglycerides 129, HDL 55, LDL 63.  Non-HDL cholesterol 69. Hb 13.1/HCT 39.3, platelets 207.  Allergies  No Known Allergies    Medications Prior to Visit:   Outpatient Medications Prior to Visit  Medication Sig Dispense Refill   acetaminophen (TYLENOL) 500 MG tablet Take 1,000 mg by mouth every 6 (six) hours as needed for moderate pain or headache.     BIOTIN PO Take 1 capsule by mouth daily with lunch.     buPROPion (WELLBUTRIN XL) 150 MG 24 hr tablet Take 1 tablet by mouth daily.     CALCIUM PO Take 1 tablet by mouth 2 (two) times daily. chewable     digoxin (LANOXIN) 0.125 MG tablet Take 1 tablet (0.125 mg total) by mouth daily. 90 tablet 3   Ferrous Sulfate (IRON PO) Take 1 tablet by mouth daily.     folic acid (FOLVITE) 1 MG tablet Take 3 mg by mouth daily with lunch.     hydrOXYzine (VISTARIL) 25 MG capsule Take 25 mg by mouth 2 (two) times daily as needed for anxiety.     JARDIANCE 25 MG TABS tablet Take 25 mg by mouth daily with lunch.     loperamide (IMODIUM) 2 MG capsule Take 1 capsule (2 mg total) by mouth as needed for diarrhea or loose stools. 20 capsule 0   magnesium hydroxide (MILK OF MAGNESIA) 400 MG/5ML suspension Take 30 mLs by mouth daily as needed for mild constipation.     methotrexate (RHEUMATREX) 2.5 MG tablet Take 20 mg by mouth every Sunday. Caution:Chemotherapy. Protect from light.     midodrine (PROAMATINE) 10 MG tablet Take 1 tablet (10 mg total) by mouth with breakfast, with lunch, and with evening meal. 90 tablet 0   Multiple Vitamin (MULTIVITAMIN WITH MINERALS) TABS tablet Take 1 tablet by mouth daily with lunch.     pantoprazole (PROTONIX) 40 MG tablet Take 40 mg by mouth daily as needed (acid reflux).  Potassium Chloride ER 20 MEQ TBCR Take 20 mEq by mouth daily as needed. Take when you take demadex     Semaglutide, 2 MG/DOSE, (OZEMPIC, 2 MG/DOSE,) 8 MG/3ML SOPN Inject 2 mg into the skin every Sunday.     sertraline (ZOLOFT) 100 MG tablet Take 150 mg by mouth daily with lunch.     torsemide (DEMADEX) 20 MG tablet Take 1 tablet (20 mg total) by mouth daily as needed. (Patient  taking differently: Take 10 mg by mouth daily.)     vitamin B-12 (CYANOCOBALAMIN) 500 MCG tablet Take 500 mcg by mouth daily with lunch.     Vitamin D, Ergocalciferol, (DRISDOL) 1.25 MG (50000 UT) CAPS capsule Take 50,000 Units by mouth every Tuesday.     CORLANOR 7.5 MG TABS tablet TAKE 1 TABLET (7.5 MG TOTAL) BY MOUTH 2 (TWO) TIMES DAILY WITH A MEAL. (Patient not taking: Reported on 11/10/2021) 60 tablet 3   No facility-administered medications prior to visit.   Final Medications at End of Visit    Current Meds  Medication Sig   acetaminophen (TYLENOL) 500 MG tablet Take 1,000 mg by mouth every 6 (six) hours as needed for moderate pain or headache.   BIOTIN PO Take 1 capsule by mouth daily with lunch.   buPROPion (WELLBUTRIN XL) 150 MG 24 hr tablet Take 1 tablet by mouth daily.   CALCIUM PO Take 1 tablet by mouth 2 (two) times daily. chewable   digoxin (LANOXIN) 0.125 MG tablet Take 1 tablet (0.125 mg total) by mouth daily.   Ferrous Sulfate (IRON PO) Take 1 tablet by mouth daily.   folic acid (FOLVITE) 1 MG tablet Take 3 mg by mouth daily with lunch.   hydrOXYzine (VISTARIL) 25 MG capsule Take 25 mg by mouth 2 (two) times daily as needed for anxiety.   JARDIANCE 25 MG TABS tablet Take 25 mg by mouth daily with lunch.   loperamide (IMODIUM) 2 MG capsule Take 1 capsule (2 mg total) by mouth as needed for diarrhea or loose stools.   magnesium hydroxide (MILK OF MAGNESIA) 400 MG/5ML suspension Take 30 mLs by mouth daily as needed for mild constipation.   methotrexate (RHEUMATREX) 2.5 MG tablet Take 20 mg by mouth every Sunday. Caution:Chemotherapy. Protect from light.   midodrine (PROAMATINE) 10 MG tablet Take 1 tablet (10 mg total) by mouth with breakfast, with lunch, and with evening meal.   Multiple Vitamin (MULTIVITAMIN WITH MINERALS) TABS tablet Take 1 tablet by mouth daily with lunch.   pantoprazole (PROTONIX) 40 MG tablet Take 40 mg by mouth daily as needed (acid reflux).   Potassium  Chloride ER 20 MEQ TBCR Take 20 mEq by mouth daily as needed. Take when you take demadex   Semaglutide, 2 MG/DOSE, (OZEMPIC, 2 MG/DOSE,) 8 MG/3ML SOPN Inject 2 mg into the skin every Sunday.   sertraline (ZOLOFT) 100 MG tablet Take 150 mg by mouth daily with lunch.   torsemide (DEMADEX) 20 MG tablet Take 1 tablet (20 mg total) by mouth daily as needed. (Patient taking differently: Take 10 mg by mouth daily.)   vitamin B-12 (CYANOCOBALAMIN) 500 MCG tablet Take 500 mcg by mouth daily with lunch.   Vitamin D, Ergocalciferol, (DRISDOL) 1.25 MG (50000 UT) CAPS capsule Take 50,000 Units by mouth every Tuesday.   Radiology:   DG Chest 2 View 04/19/2021 Stable cardiomegaly. Increased density posterior to the heart border at the lung base may represent atrial enlargement or increasing pericardial effusion.   CT Angio Chest PE  W and/or Wo Contrast 04/19/2021:   1. No pulmonary embolus.  2. Pericardial effusion has progressed from prior abdominal CT 2 months ago, now moderate to large in size measuring up to 2.8 cm posterior to the left heart.  3. Chronic mild mediastinal adenopathy is similar to 2019 exam and likely related to sarcoidosis.  4. Mild prominence of the main pulmonary artery suggesting pulmonary arterial hypertension.   Chest x-ray 06/08/2021: 1. Interval increase in size of the cardiac silhouette which may  reflect increased cardiomegaly or increased size of the known  pericardial effusion. Consider further evaluation with cardiac echo.  2. Tiny bilateral pleural effusions with bibasilar predominant  interstitial and airspace opacities likely reflecting pulmonary  edema.   Cardiac Studies:   Nuclear stress test 02/01/2016: No significant ST segment changes or arrhythmias were noted during stress  Negative ETT for ischemia at workload achieved  No significant chest pain symptoms reported  No significant arrhythmia noted  nuclear images report to follow   Right heart  catheterization and myocardial biopsy 07/17/2016: Mild pulmonary hypertension, mildly elevated pulmonary capillary wedge pressure, biopsy negative for sarcoid.  Cardiac PET sarcoidosis with nuclear medicine perfusion 03/05/2020: A myocardial SPECT scan was performed at rest and contains a large, severe perfusion defect involving the anterior, lateral and anterior lateral walls of the left ventricle, necessitating a FDG-PET viability study.  Physiologic distribution of radiotracer. No substantial accumulation of radiotracer in the area of perfusion defect. Other PET findings: Few FDG avid bilateral axillary lymph nodes, index right axillary node measuring 1 cm (SUV max 3.5). Additionally, there are a few hypermetabolic superior mediastinal lymph nodes. Index right upper paratracheal lymph node shows SUV max 2.2. CT: Few small nodules are noted in right upper lobe, similar to 12/02/2019 CT exam. Subsegmental atelectasis/scarring in left lower lobe. Left chest wall cardiac rhythm maintenance device with distal leads terminating in right atrial appendage and right ventricle and coronary sinus. Small fat-containing of umbilical hernia.   Echocardiogram 04/29/2021:  Left ventricle cavity is normal in size. Moderate asymmetric hypertrophy  of the left ventricle, with posterior wall measuring 1.5 cm.Moderate  global hypokinesis with basal inferolateral akinesis. Moderately depressed  LV systolic function with EF 35-40% Doppler evidence of grade II (pseudonormal) diastolic dysfunction, elevated LAP.  Left atrial cavity is mildly dilated at 4 cm.  Tethering of posterior leaflet with moderate (grade III), posteriorly directed mitral regurgitation.  Moderate loculated pericardial effusion with clear fluids. There is no apparent hemodynamic significance.  Compared to previous study on 09/03/2020, pericardial effusion increased  from mild. Mitral regurgitation is new.   Echocardiogram 06/24/2021:  Severely  depressed LV systolic function with EF 25%. Left ventricle cavity  is moderately dilated. Dilated cardiomyopathy. Moderate eccentric  hypertrophy of the left ventricle. Doppler evidence of grade I (impaired)  diastolic dysfunction, normal LAP. Left ventricle regional wall motion  findings: Severe global hypokinesis. Basal inferolateral, Basal inferior,  Mid inferolateral and Mid inferior akinesis. Calculated EF 25%.  Right ventricle cavity is normal in size. Right ventricle pacemaker  visualized. Normal right ventricular function.  MV annulus is dilated. Moderate (Grade II) mitral regurgitation.  Pericardium is normal. Small posterior pericardial effusion.  Compared to the study done on 04/29/2021, pericardial effusion now appears  much less significant.  However the LVEF has reduced further from 35 to  40% to the present 20 to 25%. Regional wall motion abnormality new.  Right and left heart catheterization coronary angiography 07/12/2021: Right heart pressures: LV EDP is normal. RA:  0 mmHg RV: 19/0 mmHg PA: 21/3 mmHg, mPAP 10 mmHg PCW: 4 mmHg CO: 5.7 L/min CI: 2.7 L/min/m2  Conclusion:  Left dominant circulation No coronary artery disease Well compensated nonischemic cardiomyopathy  PET cardiac sarcoidosis 09/30/2021: 1.  Moderate perfusion defect in the lateral wall of the heart.  2.  No definite evidence of myocardial uptake to suggest active myocardial sarcoidosis.  3.  Left ventricular ejection fraction is 18%. Please correlate with cardiac ultrasound  4.  Multilevel FDG avid mediastinal and bilateral hilar lymphadenopathy most compatible with sequela of sarcoidosis.   ICD   Scheduled Remote ICD check 10/05/2020:  There were 2 atrial high rate episodes detected. The longest lasted 47 seconds in duration. No EGMs available for review. There was a <1 % cumulative atrial arrhythmia burden. No VHR episodes.  Health trends (patient activity, heart rate variability, average heart  rates) are stable. The review of the implantable cardiac monitoring data remains stable. Battery longevity is 6 years . RA pacing is 1 %, RV pacing is 98 %, and LV pacing is 98 %.  Biventricular ICD transmission 07/12/2021: AP less than 1%, CRT 99%. Longevity 5 years. Lead impedance and threshold within normal limits. There were 3 PMT episodes, though no high ventricular rate episodes, no therapy. There is no heart failure monitor.  EKG   10/12/2020: AV paced rhythm at rate of 84 bpm.  Biventricular pacing detected.  Wide QRS complex.  No further analysis.    03/12/2020: Underlying sinus rhythm normal sinus rhythm at the rate of 83 bpm, ventricularly paced rhythm.  No further analysis.  Biventricular pacemaker detected.    09/15/2019: V paced rhythm, no further analysis.   Assessment     ICD-10-CM   1. Chronic systolic CHF (congestive heart failure) (HCC)  I50.22 EKG 12-Lead    2. Nonischemic cardiomyopathy (HCC)  I42.8     3. ICD: [employer] Bi-V ICD (Dynagen X4 CRT-D) MRI compatible ICD in situ  Z95.810     4. Orthostatic hypotension  I95.1      No orders of the defined types were placed in this encounter.   Medications Discontinued During This Encounter  Medication Reason   CORLANOR 7.5 MG TABS tablet Discontinued by provider    Recommendations:   Malani Lees  is a 56 y.o. 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 Cardiac MRI performed in 2011 and cardiac PET scan in August 2017 does not appear to be consistent with cardiac sarcoidosis.  She had repeat PET scan performed at [facility] on 03/05/2020 again confirming this.  She also has uncontrolled diabetes, morbid obesity, mild  sleep apnea  but not tolerating CPAP, systemic  hypertension.  Patient underwent laparoscopic gastric sleeve on  12/20/2020 and so far has lost about 60 pounds in weight.   Patient admitted to the hospital on 07/17/2021 - 07/19/2021 at which time she presented with dizziness and fatigue in the setting of hypertension.  Patient was treated for Salmonella gastroenteritis.  Given hypotension Entresto and metoprolol were discontinued and patient was discharged with midodrine 10 mg 3 times daily as well as Jardiance.  At outpatient follow-up started patient on digoxin.  Since last office visit patient's rheumatologist contacted Dr. Einar Gip and shared decision was to have PET scan done to evaluate for underlying cardiac sarcoidosis.  PET scan showed no evidence of cardiac sarcoidosis.  She now presents for 4-week follow-up.  Patient is tolerating digoxin and Jardiance as well as torsemide without issue.  Reviewed and discussed results of PET scan, details above.  Patient is relatively stable without cardiac symptoms.  There is no clinical evidence of heart failure.  Although patient is tachycardic Corlanor did not improve heart rate in the past, therefore will not restart this.  We will continue current medications.  We will repeat echocardiogram to reevaluate LVEF as it has been >3 months.  Follow-up in 3 months, with Dr. Einar Gip given complex cardiac history and severe LV systolic dysfunction.  Patient was seen in collaboration with Dr. Einar Gip and he is in agreement with the plan.    Alethia Berthold, PA-C 11/10/2021, 4:07 PM Office: [phone]

## 2021-11-17 ENCOUNTER — Encounter: Payer: Medicaid Other | Admitting: Cardiology

## 2021-11-29 NOTE — Telephone Encounter (Signed)
error 

## 2021-12-03 ENCOUNTER — Other Ambulatory Visit: Payer: Self-pay | Admitting: Cardiology

## 2021-12-07 ENCOUNTER — Encounter: Payer: Self-pay | Admitting: Student

## 2021-12-07 ENCOUNTER — Other Ambulatory Visit: Payer: Self-pay

## 2021-12-07 ENCOUNTER — Ambulatory Visit: Payer: Medicaid Other | Admitting: Student

## 2021-12-07 VITALS — BP 106/71 | HR 92 | Temp 98.2°F | Resp 16 | Ht 67.0 in | Wt 209.4 lb

## 2021-12-07 DIAGNOSIS — I5022 Chronic systolic (congestive) heart failure: Secondary | ICD-10-CM

## 2021-12-07 LAB — PRO B NATRIURETIC PEPTIDE: NT-Pro BNP: 1771 pg/mL — ABNORMAL HIGH (ref 0–287)

## 2021-12-07 NOTE — Progress Notes (Signed)
Primary Physician/Referring:  [name], MD  Patient ID: Alicia Cook, female    DOB: 08-17-1966, 56 y.o.   MRN: [id number]  Chief Complaint  Patient presents with   Fluid around the heart   Chronic systolic CHF      HPI:    Alicia Cook  is a 56 y.o. 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 Cardiac MRI performed in 2011 and cardiac PET scan in August 2017 does not appear to be consistent with cardiac sarcoidosis.  She had repeat PET scan performed at [facility] on 03/05/2020 again confirming this.  She also has uncontrolled diabetes, morbid obesity, mild sleep apnea  but not tolerating CPAP, systemic  hypertension.  Patient underwent laparoscopic gastric sleeve on 12/20/2020 and so far has lost about 60 pounds in weight.    Patient admitted to the hospital on 07/17/2021 - 07/19/2021 at which time she presented with dizziness and fatigue in the setting of hypertension.  Patient was treated for Salmonella gastroenteritis.  Given hypotension Entresto and metoprolol were discontinued and patient was discharged with midodrine 10 mg 3 times daily as well as Jardiance.  At outpatient follow-up started patient on digoxin.  Patient's rheumatologist subsequently contacted Dr. Einar Gip and shared decision was to have PET scan done to evaluate for underlying cardiac sarcoidosis.  PET scan showed no evidence of cardiac sarcoidosis.  Patient presents for urgent visit at her request with concerns of shortness of breath, weight gain, and "fullness in her chest" starting last night.  Patient notably has EGD scheduled for tomorrow and was concerned that she may have fluid.  Upon further questioning patient states she has gained approximately 5 pounds in the last 1 week.  However she also notes that she has been dealing  with constipation for the last 2 weeks, recently seen by PCP and given stool softener, however she still has only had small bowel movement. Last night patient also noticed wheezing. Denies orthopnea or PND. She also complains of abdominal pain.  Patient took torsemide 20 mg this morning and symptoms have significantly improved.  Past Medical History:  Diagnosis Date   Cataract    CHF (congestive heart failure) ([place])    Depression    Diabetes mellitus without complication ([place])    Encounter for adjustment of biventricular implantable cardioverter-defibrillator (ICD) 06/13/2019   Hypertension    ICD: Cardiac defibrillator in situ 01/13/2015   [employer] Bi-V ICD (Dynagen X4 CRT-D) 01/13/2015 at [facility]  Scheduled Remote ICD check 4.22.20: 1 SVT/8 secs, normal function. Battery longevity is 8 years. RA pacing is 1 %, RV pacing is 99 %, and LV pacing is 99 %.   Nonischemic cardiomyopathy ([place])    a. EF 35-40% by cath in 2011 with normal cors b. s/p ICD implantation in 12/2014 c. EF 25% by NST in 02/01/2016   Sarcoidosis    Sarcoidosis 02/08/2016   Past Surgical History:  Procedure Laterality Date   ABDOMINAL SURGERY  12/20/2020    laparoscopic sleeve gastrectomy   CARDIAC CATHETERIZATION N/A 02/08/2016   Procedure: Left Heart Cath and Coronary Angiography;  Surgeon: Jettie Booze, MD;  Location: [place] [facility];  Service: Cardiovascular;  Laterality: N/A;   CATARACT EXTRACTION Right 12/04/2018   CESAREAN SECTION     CHOLECYSTECTOMY     INSERTION OF ICD  a. s/p dual-chamber [employer] ICD 12/2014   LAPAROSCOPIC GASTRIC SLEEVE RESECTION     PACEMAKER INSERTION     RIGHT/LEFT HEART CATH AND CORONARY ANGIOGRAPHY N/A 07/12/2021   Procedure: RIGHT/LEFT HEART CATH AND CORONARY ANGIOGRAPHY;  Surgeon: Nigel Mormon, MD;  Location: [place] [facility];  Service: Cardiovascular;  Laterality: N/A;   TUBAL LIGATION     Family History  Problem  Relation Age of Onset   Heart attack Father        a. Initial onset in his 51's. S/p CABG   Heart failure Father     Social History   Tobacco Use   Smoking status: Former    Packs/day: 1.00    Years: 20.00    Pack years: 20.00    Types: Cigarettes    Quit date: 2016    Years since quitting: 7.1   Smokeless tobacco: Never  Substance Use Topics   Alcohol use: No    Alcohol/week: 0.0 standard drinks   Marital Status: Single  ROS  Review of Systems  Constitutional: Positive for weight gain. Negative for malaise/fatigue and weight loss (intentional).  Cardiovascular:  Negative for chest pain, claudication, leg swelling, near-syncope, orthopnea, palpitations, paroxysmal nocturnal dyspnea and syncope.  Respiratory:  Positive for shortness of breath and wheezing.   Neurological:  Negative for dizziness.  Objective  Blood pressure 106/71, pulse 92, temperature 98.2 F (36.8 C), temperature source Temporal, resp. rate 16, height 5' 7"  (1.702 m), weight 209 lb 6.4 oz (95 kg), last menstrual period 04/17/2016, SpO2 98 %.  Vitals with BMI 12/07/2021 11/10/2021 10/05/2021  Height 5' 7"  5' 7"  -  Weight 209 lbs 6 oz 207 lbs 10 oz -  BMI 88.89 16.94 -  Systolic 503 888 280  Diastolic 71 77 72  Pulse 92 106 83   Orthostatic VS for the past 72 hrs (Last 3 readings):  Patient Position BP Location Cuff Size  12/07/21 1545 Sitting Left Arm Normal     Physical Exam Vitals reviewed.  Constitutional:      General: She is not in acute distress.    Appearance: She is well-developed.     Comments: Morbidly obese in no acute distress.  Neck:     Thyroid: No thyromegaly.     Vascular: No carotid bruit or JVD.  Cardiovascular:     Rate and Rhythm: Normal rate and regular rhythm.     Pulses: Intact distal pulses.          Carotid pulses are 2+ on the right side and 2+ on the left side.      Dorsalis pedis pulses are 2+ on the right side and 2+ on the left side.       Posterior tibial pulses are  2+ on the right side and 2+ on the left side.     Heart sounds: Normal heart sounds, S1 normal and S2 normal. No murmur heard.   No gallop.  Pulmonary:     Effort: Pulmonary effort is normal. No accessory muscle usage.     Breath sounds: Normal breath sounds. No wheezing or rales.  Abdominal:     Comments: Obese. Pannus present  Musculoskeletal:     Right lower leg: No edema.     Left lower leg: No edema.    Laboratory examination:   CMP Latest Ref Rng & Units 10/05/2021 07/19/2021 07/18/2021  Glucose 70 - 99 mg/dL 101(H) 84 83  BUN 6 - 20 mg/dL 13 9 11   Creatinine 0.44 -  1.00 mg/dL 0.73 0.75 0.69  Sodium 135 - 145 mmol/L 140 141 137  Potassium 3.5 - 5.1 mmol/L 3.4(L) 4.5 3.9  Chloride 98 - 111 mmol/L 106 106 105  CO2 22 - 32 mmol/L 26 27 26   Calcium 8.9 - 10.3 mg/dL 9.2 9.1 8.9  Total Protein 6.5 - 8.1 g/dL - - 6.5  Total Bilirubin 0.3 - 1.2 mg/dL - - 0.4  Alkaline Phos 38 - 126 U/L - - 78  AST 15 - 41 U/L - - 26  ALT 0 - 44 U/L - - 28   CBC Latest Ref Rng & Units 10/05/2021 07/19/2021 07/18/2021  WBC 4.0 - 10.5 K/uL 11.4(H) 8.2 6.4  Hemoglobin 12.0 - 15.0 g/dL 13.8 12.3 12.9  Hematocrit 36.0 - 46.0 % 42.0 37.6 40.0  Platelets 150 - 400 K/uL 169 237 217   Lipid Panel     Component Value Date/Time   CHOL 165 02/09/2016 0700   TRIG 94 02/09/2016 0700   HDL 64 02/09/2016 0700   CHOLHDL 2.6 02/09/2016 0700   VLDL 19 02/09/2016 0700   LDLCALC 82 02/09/2016 0700   HEMOGLOBIN A1C Lab Results  Component Value Date   HGBA1C 6.0 (H) 07/17/2021   MPG 125.5 07/17/2021   TSH No results for input(s): TSH in the last 8760 hours.  External labs:  06/14/2021: Sodium 140, potassium 3.4, glucose 79, BUN 18, creatinine 1.04, GFR 64 BNP 233 TSH 0.48  12/22/2020: Hb 12.1/HCT 36.8, platelets 227. Sodium 136, potassium 3.8, BUN 7, creatinine 0.69, EGFR >90 mL.  Serum glucose 106 mg.  08/31/2020: A1c 7.6%  07/20/2020: Glucose 101, sodium 139, potassium 4.2, BUN 12, creatinine  0.75, EGFR greater than 90 Hemoglobin 13.0, hematocrit 39.8, platelets 250  03/04/2020: BUN 14, creatinine 0.8, serum glucose 140 mg, EGFR >60 mL.  Magnesium 1.6. Total cholesterol 124, triglycerides 129, HDL 55, LDL 63.  Non-HDL cholesterol 69. Hb 13.1/HCT 39.3, platelets 207.  Allergies  No Known Allergies   Medications Prior to Visit:   Outpatient Medications Prior to Visit  Medication Sig Dispense Refill   BIOTIN PO Take 1 capsule by mouth daily with lunch.     CALCIUM PO Take 1 tablet by mouth 2 (two) times daily. chewable     digoxin (LANOXIN) 0.125 MG tablet Take 1 tablet (0.125 mg total) by mouth daily. 90 tablet 3   Ferrous Sulfate (IRON PO) Take 1 tablet by mouth daily.     folic acid (FOLVITE) 1 MG tablet Take 3 mg by mouth daily with lunch.     hydrOXYzine (VISTARIL) 25 MG capsule Take 25 mg by mouth 2 (two) times daily as needed for anxiety.     JARDIANCE 25 MG TABS tablet Take 25 mg by mouth daily with lunch.     magnesium hydroxide (MILK OF MAGNESIA) 400 MG/5ML suspension Take 30 mLs by mouth daily as needed for mild constipation.     methotrexate (RHEUMATREX) 2.5 MG tablet Take 20 mg by mouth every Sunday. Caution:Chemotherapy. Protect from light.     midodrine (PROAMATINE) 10 MG tablet TAKE 1 TABLET (10 MG TOTAL) BY MOUTH WITH BREAKFAST, WITH LUNCH, AND WITH EVENING MEAL. 90 tablet 0   Multiple Vitamin (MULTIVITAMIN WITH MINERALS) TABS tablet Take 1 tablet by mouth daily with lunch.     pantoprazole (PROTONIX) 40 MG tablet Take 40 mg by mouth daily as needed (acid reflux).     Potassium Chloride ER 20 MEQ TBCR Take 20 mEq by mouth daily as needed. Take when  you take demadex     Semaglutide, 2 MG/DOSE, (OZEMPIC, 2 MG/DOSE,) 8 MG/3ML SOPN Inject 2 mg into the skin every Sunday.     Sertraline HCl 200 MG CAPS Take 200 mg by mouth daily with lunch.     torsemide (DEMADEX) 20 MG tablet Take 1 tablet (20 mg total) by mouth daily as needed. (Patient taking differently: Take 10  mg by mouth daily.)     vitamin B-12 (CYANOCOBALAMIN) 500 MCG tablet Take 500 mcg by mouth daily with lunch.     Vitamin D, Ergocalciferol, (DRISDOL) 1.25 MG (50000 UT) CAPS capsule Take 50,000 Units by mouth every Tuesday.     acetaminophen (TYLENOL) 500 MG tablet Take 1,000 mg by mouth every 6 (six) hours as needed for moderate pain or headache.     buPROPion (WELLBUTRIN XL) 150 MG 24 hr tablet Take 1 tablet by mouth daily.     loperamide (IMODIUM) 2 MG capsule Take 1 capsule (2 mg total) by mouth as needed for diarrhea or loose stools. 20 capsule 0   No facility-administered medications prior to visit.   Final Medications at End of Visit    Current Meds  Medication Sig   BIOTIN PO Take 1 capsule by mouth daily with lunch.   CALCIUM PO Take 1 tablet by mouth 2 (two) times daily. chewable   digoxin (LANOXIN) 0.125 MG tablet Take 1 tablet (0.125 mg total) by mouth daily.   Ferrous Sulfate (IRON PO) Take 1 tablet by mouth daily.   folic acid (FOLVITE) 1 MG tablet Take 3 mg by mouth daily with lunch.   hydrOXYzine (VISTARIL) 25 MG capsule Take 25 mg by mouth 2 (two) times daily as needed for anxiety.   JARDIANCE 25 MG TABS tablet Take 25 mg by mouth daily with lunch.   magnesium hydroxide (MILK OF MAGNESIA) 400 MG/5ML suspension Take 30 mLs by mouth daily as needed for mild constipation.   methotrexate (RHEUMATREX) 2.5 MG tablet Take 20 mg by mouth every Sunday. Caution:Chemotherapy. Protect from light.   midodrine (PROAMATINE) 10 MG tablet TAKE 1 TABLET (10 MG TOTAL) BY MOUTH WITH BREAKFAST, WITH LUNCH, AND WITH EVENING MEAL.   Multiple Vitamin (MULTIVITAMIN WITH MINERALS) TABS tablet Take 1 tablet by mouth daily with lunch.   pantoprazole (PROTONIX) 40 MG tablet Take 40 mg by mouth daily as needed (acid reflux).   Potassium Chloride ER 20 MEQ TBCR Take 20 mEq by mouth daily as needed. Take when you take demadex   Semaglutide, 2 MG/DOSE, (OZEMPIC, 2 MG/DOSE,) 8 MG/3ML SOPN Inject 2 mg into  the skin every Sunday.   Sertraline HCl 200 MG CAPS Take 200 mg by mouth daily with lunch.   torsemide (DEMADEX) 20 MG tablet Take 1 tablet (20 mg total) by mouth daily as needed. (Patient taking differently: Take 10 mg by mouth daily.)   vitamin B-12 (CYANOCOBALAMIN) 500 MCG tablet Take 500 mcg by mouth daily with lunch.   Vitamin D, Ergocalciferol, (DRISDOL) 1.25 MG (50000 UT) CAPS capsule Take 50,000 Units by mouth every Tuesday.   Radiology:   DG Chest 2 View 04/19/2021 Stable cardiomegaly. Increased density posterior to the heart border at the lung base may represent atrial enlargement or increasing pericardial effusion.   CT Angio Chest PE W and/or Wo Contrast 04/19/2021:   1. No pulmonary embolus.  2. Pericardial effusion has progressed from prior abdominal CT 2 months ago, now moderate to large in size measuring up to 2.8 cm posterior to the left heart.  3. Chronic mild mediastinal adenopathy is similar to 2019 exam and likely related to sarcoidosis.  4. Mild prominence of the main pulmonary artery suggesting pulmonary arterial hypertension.   Chest x-ray 06/08/2021: 1. Interval increase in size of the cardiac silhouette which may  reflect increased cardiomegaly or increased size of the known  pericardial effusion. Consider further evaluation with cardiac echo.  2. Tiny bilateral pleural effusions with bibasilar predominant  interstitial and airspace opacities likely reflecting pulmonary  edema.   Cardiac Studies:   Nuclear stress test 02/01/2016: No significant ST segment changes or arrhythmias were noted during stress  Negative ETT for ischemia at workload achieved  No significant chest pain symptoms reported  No significant arrhythmia noted  nuclear images report to follow   Right heart catheterization and myocardial biopsy 07/17/2016: Mild pulmonary hypertension, mildly elevated pulmonary capillary wedge pressure, biopsy negative for sarcoid.  Cardiac PET sarcoidosis  with nuclear medicine perfusion 03/05/2020: A myocardial SPECT scan was performed at rest and contains a large, severe perfusion defect involving the anterior, lateral and anterior lateral walls of the left ventricle, necessitating a FDG-PET viability study.  Physiologic distribution of radiotracer. No substantial accumulation of radiotracer in the area of perfusion defect. Other PET findings: Few FDG avid bilateral axillary lymph nodes, index right axillary node measuring 1 cm (SUV max 3.5). Additionally, there are a few hypermetabolic superior mediastinal lymph nodes. Index right upper paratracheal lymph node shows SUV max 2.2. CT: Few small nodules are noted in right upper lobe, similar to 12/02/2019 CT exam. Subsegmental atelectasis/scarring in left lower lobe. Left chest wall cardiac rhythm maintenance device with distal leads terminating in right atrial appendage and right ventricle and coronary sinus. Small fat-containing of umbilical hernia.   Echocardiogram 04/29/2021:  Left ventricle cavity is normal in size. Moderate asymmetric hypertrophy  of the left ventricle, with posterior wall measuring 1.5 cm.Moderate  global hypokinesis with basal inferolateral akinesis. Moderately depressed  LV systolic function with EF 35-40% Doppler evidence of grade II (pseudonormal) diastolic dysfunction, elevated LAP.  Left atrial cavity is mildly dilated at 4 cm.  Tethering of posterior leaflet with moderate (grade III), posteriorly directed mitral regurgitation.  Moderate loculated pericardial effusion with clear fluids. There is no apparent hemodynamic significance.  Compared to previous study on 09/03/2020, pericardial effusion increased  from mild. Mitral regurgitation is new.   Echocardiogram 06/24/2021:  Severely depressed LV systolic function with EF 25%. Left ventricle cavity  is moderately dilated. Dilated cardiomyopathy. Moderate eccentric  hypertrophy of the left ventricle. Doppler evidence of  grade I (impaired)  diastolic dysfunction, normal LAP. Left ventricle regional wall motion  findings: Severe global hypokinesis. Basal inferolateral, Basal inferior,  Mid inferolateral and Mid inferior akinesis. Calculated EF 25%.  Right ventricle cavity is normal in size. Right ventricle pacemaker  visualized. Normal right ventricular function.  MV annulus is dilated. Moderate (Grade II) mitral regurgitation.  Pericardium is normal. Small posterior pericardial effusion.  Compared to the study done on 04/29/2021, pericardial effusion now appears  much less significant.  However the LVEF has reduced further from 35 to  40% to the present 20 to 25%. Regional wall motion abnormality new.  Right and left heart catheterization coronary angiography 07/12/2021: Right heart pressures: LV EDP is normal. RA: 0 mmHg RV: 19/0 mmHg PA: 21/3 mmHg, mPAP 10 mmHg PCW: 4 mmHg CO: 5.7 L/min CI: 2.7 L/min/m2  Conclusion:  Left dominant circulation No coronary artery disease Well compensated nonischemic cardiomyopathy  PET cardiac sarcoidosis 09/30/2021: 1.  Moderate perfusion defect in the lateral wall of the heart.  2.  No definite evidence of myocardial uptake to suggest active myocardial sarcoidosis.  3.  Left ventricular ejection fraction is 18%. Please correlate with cardiac ultrasound  4.  Multilevel FDG avid mediastinal and bilateral hilar lymphadenopathy most compatible with sequela of sarcoidosis.   ICD   Scheduled Remote ICD check 10/05/2020:  There were 2 atrial high rate episodes detected. The longest lasted 47 seconds in duration. No EGMs available for review. There was a <1 % cumulative atrial arrhythmia burden. No VHR episodes.  Health trends (patient activity, heart rate variability, average heart rates) are stable. The review of the implantable cardiac monitoring data remains stable. Battery longevity is 6 years . RA pacing is 1 %, RV pacing is 98 %, and LV pacing is 98  %.  Biventricular ICD transmission 07/12/2021: AP less than 1%, CRT 99%. Longevity 5 years. Lead impedance and threshold within normal limits. There were 3 PMT episodes, though no high ventricular rate episodes, no therapy. There is no heart failure monitor.  EKG   10/12/2020: AV paced rhythm at rate of 84 bpm.  Biventricular pacing detected.  Wide QRS complex.  No further analysis.    03/12/2020: Underlying sinus rhythm normal sinus rhythm at the rate of 83 bpm, ventricularly paced rhythm.  No further analysis.  Biventricular pacemaker detected.    09/15/2019: V paced rhythm, no further analysis.   Assessment     ICD-10-CM   1. Chronic systolic CHF (congestive heart failure) (HCC)  I50.22 B Nat Peptide    Pro b natriuretic peptide (BNP)[id number] [facility])     No orders of the defined types were placed in this encounter.   Medications Discontinued During This Encounter  Medication Reason   acetaminophen (TYLENOL) 500 MG tablet    buPROPion (WELLBUTRIN XL) 150 MG 24 hr tablet    loperamide (IMODIUM) 2 MG capsule     Recommendations:   Alicia Cook  is a 56 y.o. 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 Cardiac MRI performed in 2011 and cardiac PET scan in August 2017 does not appear to be consistent with cardiac sarcoidosis.  She had repeat PET scan performed at [facility] on 03/05/2020 again confirming this.  She also has uncontrolled diabetes, morbid obesity, mild  sleep apnea  but not tolerating CPAP, systemic  hypertension.  Patient underwent laparoscopic gastric sleeve on 12/20/2020 and so far has lost about 60 pounds in weight.   Patient admitted to the hospital on 07/17/2021 - 07/19/2021 at which time she presented with dizziness and fatigue in the setting of hypertension.  Patient was  treated for Salmonella gastroenteritis.  Given hypotension Entresto and metoprolol were discontinued and patient was discharged with midodrine 10 mg 3 times daily as well as Jardiance.  At outpatient follow-up started patient on digoxin.  Patient's rheumatologist subsequently contacted Dr. Einar Gip and shared decision was to have PET scan done to evaluate for underlying cardiac sarcoidosis.  PET scan showed no evidence of cardiac sarcoidosis.  Patient presents for urgent visit with concerns of weight gain, dyspnea, and "chest fullness". Symptoms could be related to concern for underlying esophageal disease as well as constipation. However, given her cardiac history and improvement of symptoms with torsemide today I am concerned she may be volume overloaded, although she appears euvolemic on exam.  Discussed postponing the procedure tomorrow, however patient was resistant. Therefore shared decision was to obtain stat pro-BNP; if it is stable patient can have EGD done with relatively low risk, if it is significantly elevated would recommend postponing EGD by 1 week and have patient take additional doses of torsemide for 3 days.   Further recommendations pending results of pro-BNP.    Alethia Berthold, PA-C 12/07/2021, 4:41 PM Office: [phone]

## 2021-12-08 ENCOUNTER — Telehealth: Payer: Self-pay

## 2021-12-08 NOTE — Telephone Encounter (Signed)
Advised patient to take Torsemide 20 mg once daily for the next 3 days.  Answered her questions regarding elevated pro-BNP

## 2021-12-08 NOTE — Telephone Encounter (Signed)
Patient called and  stated that she spoke with you about some lab results and medication changes.  She is really worried and wants you to give her a call back please.

## 2021-12-08 NOTE — Progress Notes (Signed)
Spoke with patient given her elevated proBNP and recent symptoms concerning for volume overload advised patient that I will recommend postponing EGD by 1 week.  Advised her to double up the torsemide dosing for the next 4 days.  As long as she is symptom-free by next week she could undergo EGD with relatively low risk from a cardiac standpoint.

## 2021-12-30 ENCOUNTER — Other Ambulatory Visit: Payer: Self-pay | Admitting: Cardiology

## 2022-01-24 ENCOUNTER — Telehealth: Payer: Self-pay | Admitting: Cardiology

## 2022-01-24 ENCOUNTER — Other Ambulatory Visit: Payer: Self-pay | Admitting: Cardiology

## 2022-01-24 NOTE — Telephone Encounter (Signed)
There is currently no order for this patient's echo. She is scheduled for tomorrow. Please put in one. Thank you. ?

## 2022-01-25 ENCOUNTER — Other Ambulatory Visit: Payer: Self-pay

## 2022-01-25 ENCOUNTER — Ambulatory Visit: Payer: Medicaid Other

## 2022-01-25 DIAGNOSIS — I10 Essential (primary) hypertension: Secondary | ICD-10-CM

## 2022-02-09 ENCOUNTER — Encounter: Payer: Self-pay | Admitting: Student

## 2022-02-09 ENCOUNTER — Ambulatory Visit: Payer: Medicaid Other | Admitting: Student

## 2022-02-09 VITALS — BP 114/75 | HR 78 | Temp 98.0°F | Resp 16 | Ht 67.0 in | Wt 205.0 lb

## 2022-02-09 DIAGNOSIS — I5022 Chronic systolic (congestive) heart failure: Secondary | ICD-10-CM

## 2022-02-09 DIAGNOSIS — Z95810 Presence of automatic (implantable) cardiac defibrillator: Secondary | ICD-10-CM

## 2022-02-09 DIAGNOSIS — I428 Other cardiomyopathies: Secondary | ICD-10-CM

## 2022-02-09 NOTE — Progress Notes (Signed)
? ?Primary Physician/Referring:  [name], MD ? ?Patient ID: Alicia Cook, female    DOB: [birth year], 56 y.o.   MRN: [id number] ? ?No chief complaint on file. ? ? ? ?HPI:   ? ?Alicia Cook  is a 56 y.o. 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 Cardiac MRI performed in 2011 and cardiac PET scan in August 2017 does not appear to be consistent with cardiac sarcoidosis.  She had repeat PET scan performed at [facility] on 03/05/2020 again confirming this. ? ?She also has uncontrolled diabetes, morbid obesity, mild sleep apnea  but not tolerating CPAP, systemic  hypertension.  Patient underwent laparoscopic gastric sleeve on 12/20/2020 and so far has lost about 60 pounds in weight.   ? ?Patient admitted to the hospital on 07/17/2021 - 07/19/2021 at which time she presented with dizziness and fatigue in the setting of hypertension.  Patient was treated for Salmonella gastroenteritis.  Given hypotension Entresto and metoprolol were discontinued and patient was discharged with midodrine 10 mg 3 times daily as well as Jardiance.  At outpatient follow-up started patient on digoxin. ? ?Patient's rheumatologist subsequently contacted Dr. Einar Gip and shared decision was to have PET scan done to evaluate for underlying cardiac sarcoidosis.  PET scan showed no evidence of cardiac sarcoidosis. ? ?Patient presents for 6-week follow-up.  Patient states she has had improvement of "chest fullness" symptoms since last office visit.  Weight has trended down approximately 4 pounds since last visit.  She continues to have dyspnea on exertion, however denies orthopnea and PND.  Overall dyspnea is improved since last office visit.  She continues to take an extra dose of torsemide as needed for shortness of breath or "chest fullness". ? ?Past Medical  History:  ?Diagnosis Date  ? Cataract   ? CHF (congestive heart failure) ([place])   ? Depression   ? Diabetes mellitus without complication ([place])   ? Encounter for adjustment of biventricular implantable cardioverter-defibrillator (ICD) 06/13/2019  ? Hypertension   ? ICD: Cardiac defibrillator in situ 01/13/2015  ? [employer] Bi-V ICD (Dynagen X4 CRT-D) 01/13/2015 at [facility] - MRI Compatible  Scheduled Remote ICD check 4.22.20: 1 SVT/8 secs, normal function. Battery longevity is 8 years. RA pacing is 1 %, RV pacing is 99 %, and LV pacing is 99 %.  ? Nonischemic cardiomyopathy ([place])   ? a. EF 35-40% by cath in 2011 with normal cors b. s/p ICD implantation in 12/2014 c. EF 25% by NST in 02/01/2016  ? Sarcoidosis   ? Sarcoidosis 02/08/2016  ? ?Past Surgical History:  ?Procedure Laterality Date  ? ABDOMINAL SURGERY  12/20/2020  ?  laparoscopic sleeve gastrectomy  ? CARDIAC CATHETERIZATION N/A 02/08/2016  ? Procedure: Left Heart Cath and Coronary Angiography;  Surgeon: Jettie Booze, MD;  Location: [place] [facility];  Service: Cardiovascular;  Laterality: N/A;  ? CATARACT EXTRACTION Right 12/04/2018  ? CESAREAN SECTION    ? CHOLECYSTECTOMY    ? INSERTION OF ICD    ? a. s/p dual-chamber [employer] ICD 12/2014  ? LAPAROSCOPIC GASTRIC SLEEVE RESECTION    ? PACEMAKER INSERTION    ? RIGHT/LEFT HEART CATH AND CORONARY ANGIOGRAPHY N/A 07/12/2021  ? Procedure: RIGHT/LEFT HEART CATH AND CORONARY ANGIOGRAPHY;  Surgeon: Nigel Mormon, MD;  Location: [place] [facility];  Service: Cardiovascular;  Laterality: N/A;  ? TUBAL  LIGATION    ? ?Family History  ?Problem Relation Age of Onset  ? Heart attack Father   ?     a. Initial onset in his 68's. S/p CABG  ? Heart failure Father   ?  ?Social History  ? ?Tobacco Use  ? Smoking status: Former  ?  Packs/day: 1.00  ?  Years: 20.00  ?  Pack years: 20.00  ?  Types: Cigarettes  ?  Quit date: 2016  ?  Years since quitting: 7.2  ? Smokeless tobacco: Never  ?Substance  Use Topics  ? Alcohol use: No  ?  Alcohol/week: 0.0 standard drinks  ? ?Marital Status: Single  ?ROS  ?Review of Systems  ?Constitutional: Negative for malaise/fatigue and weight loss (intentional).  ?Cardiovascular:  Positive for dyspnea on exertion (stable). Negative for chest pain, claudication, leg swelling, near-syncope, orthopnea, palpitations, paroxysmal nocturnal dyspnea and syncope.  ?Respiratory:  Positive for shortness of breath (improved). Negative for wheezing.   ?Neurological:  Negative for dizziness.  ?Objective  ?Blood pressure 114/75, pulse 78, temperature 98 ?F (36.7 ?C), resp. rate 16, height 5' 7"  (1.702 m), weight 205 lb (93 kg), last menstrual period 04/17/2016, SpO2 99 %.  ? ?  02/09/2022  ?  3:47 PM 12/07/2021  ?  3:45 PM 11/10/2021  ?  3:13 PM  ?Vitals with BMI  ?Height 5' 7"  5' 7"  5' 7"   ?Weight 205 lbs 209 lbs 6 oz 207 lbs 10 oz  ?BMI 32.1 32.79 32.51  ?Systolic 818 299 371  ?Diastolic 75 71 77  ?Pulse 78 92 106  ? No data found. ? ? Physical Exam ?Vitals reviewed.  ?Constitutional:   ?   General: She is not in acute distress. ?   Appearance: She is well-developed.  ?   Comments: Morbidly obese in no acute distress.  ?Neck:  ?   Thyroid: No thyromegaly.  ?   Vascular: No carotid bruit or JVD.  ?Cardiovascular:  ?   Rate and Rhythm: Normal rate and regular rhythm.  ?   Pulses: Intact distal pulses.     ?     Carotid pulses are 2+ on the right side and 2+ on the left side. ?     Dorsalis pedis pulses are 2+ on the right side and 2+ on the left side.  ?     Posterior tibial pulses are 2+ on the right side and 2+ on the left side.  ?   Heart sounds: Normal heart sounds, S1 normal and S2 normal. No murmur heard. ?  No gallop.  ?Pulmonary:  ?   Effort: Pulmonary effort is normal. No accessory muscle usage.  ?   Breath sounds: Normal breath sounds. No wheezing or rales.  ?Abdominal:  ?   Comments: Obese. Pannus present  ?Musculoskeletal:  ?   Right lower leg: No edema.  ?   Left lower leg: No edema.   ?Physical exam unchanged compared to previous office visit. ? ?Laboratory examination:  ? ? ?  Latest Ref Rng & Units 10/05/2021  ?  7:48 AM 07/19/2021  ?  3:56 AM 07/18/2021  ?  3:25 AM  ?CMP  ?Glucose 70 - 99 mg/dL 101   84   83    ?BUN 6 - 20 mg/dL 13   9   11     ?Creatinine 0.44 - 1.00 mg/dL 0.73   0.75   0.69    ?Sodium 135 - 145 mmol/L 140   141   137    ?  Potassium 3.5 - 5.1 mmol/L 3.4   4.5   3.9    ?Chloride 98 - 111 mmol/L 106   106   105    ?CO2 22 - 32 mmol/L 26   27   26     ?Calcium 8.9 - 10.3 mg/dL 9.2   9.1   8.9    ?Total Protein 6.5 - 8.1 g/dL   6.5    ?Total Bilirubin 0.3 - 1.2 mg/dL   0.4    ?Alkaline Phos 38 - 126 U/L   78    ?AST 15 - 41 U/L   26    ?ALT 0 - 44 U/L   28    ? ? ?  Latest Ref Rng & Units 10/05/2021  ?  7:48 AM 07/19/2021  ?  3:56 AM 07/18/2021  ?  3:25 AM  ?CBC  ?WBC 4.0 - 10.5 K/uL 11.4   8.2   6.4    ?Hemoglobin 12.0 - 15.0 g/dL 13.8   12.3   12.9    ?Hematocrit 36.0 - 46.0 % 42.0   37.6   40.0    ?Platelets 150 - 400 K/uL 169   237   217    ? ?Lipid Panel  ?   ?Component Value Date/Time  ? CHOL 165 02/09/2016 0700  ? TRIG 94 02/09/2016 0700  ? HDL 64 02/09/2016 0700  ? CHOLHDL 2.6 02/09/2016 0700  ? VLDL 19 02/09/2016 0700  ? [place] 82 02/09/2016 0700  ? ?HEMOGLOBIN A1C ?Lab Results  ?Component Value Date  ? HGBA1C 6.0 (H) 07/17/2021  ? MPG 125.5 07/17/2021  ? ?TSH ?No results for input(s): TSH in the last 8760 hours. ? ?External labs:  ?06/14/2021: ?Sodium 140, potassium 3.4, glucose 79, BUN 18, creatinine 1.04, GFR 64 ?BNP 233 ?TSH 0.48 ? ?12/22/2020: ?Hb 12.1/HCT 36.8, platelets 227. ?Sodium 136, potassium 3.8, BUN 7, creatinine 0.69, EGFR >90 mL.  Serum glucose 106 mg. ? ?08/31/2020: ?A1c 7.6% ? ?07/20/2020: ?Glucose 101, sodium 139, potassium 4.2, BUN 12, creatinine 0.75, EGFR greater than 90 ?Hemoglobin 13.0, hematocrit 39.8, platelets 250 ? ?03/04/2020: ?BUN 14, creatinine 0.8, serum glucose 140 mg, EGFR >60 mL.  Magnesium 1.6. ?Total cholesterol 124, triglycerides 129, HDL 55,  LDL 63.  Non-HDL cholesterol 69. ?Hb 13.1/HCT 39.3, platelets 207. ? ?Allergies  ?No Known Allergies  ? ?Medications Prior to Visit:  ? ?Outpatient Medications Prior to Visit  ?Medication Sig Dispense

## 2022-02-21 ENCOUNTER — Other Ambulatory Visit: Payer: Self-pay | Admitting: Cardiology

## 2022-02-25 ENCOUNTER — Encounter (HOSPITAL_BASED_OUTPATIENT_CLINIC_OR_DEPARTMENT_OTHER): Payer: Self-pay | Admitting: Emergency Medicine

## 2022-02-25 ENCOUNTER — Other Ambulatory Visit: Payer: Self-pay

## 2022-02-25 ENCOUNTER — Emergency Department (HOSPITAL_BASED_OUTPATIENT_CLINIC_OR_DEPARTMENT_OTHER)
Admission: EM | Admit: 2022-02-25 | Discharge: 2022-02-25 | Disposition: A | Discharge status: Home / Self Care | Payer: Medicaid Other | Attending: Emergency Medicine | Admitting: Emergency Medicine

## 2022-02-25 DIAGNOSIS — M5442 Lumbago with sciatica, left side: Secondary | ICD-10-CM | POA: Diagnosis not present

## 2022-02-25 DIAGNOSIS — I509 Heart failure, unspecified: Secondary | ICD-10-CM | POA: Insufficient documentation

## 2022-02-25 DIAGNOSIS — M545 Low back pain, unspecified: Secondary | ICD-10-CM | POA: Diagnosis present

## 2022-02-25 LAB — URINALYSIS, ROUTINE W REFLEX MICROSCOPIC
Bilirubin Urine: NEGATIVE
Glucose, UA: >=500 mg/dL — AB
Hgb urine dipstick: NEGATIVE
Ketones, ur: NEGATIVE mg/dL
Leukocytes,Ua: NEGATIVE
Nitrite: NEGATIVE
Protein, ur: NEGATIVE mg/dL
Specific Gravity, Urine: 1.02 (ref 1.005–1.030)
pH: 5.5 (ref 5.0–8.0)

## 2022-02-25 LAB — URINALYSIS, MICROSCOPIC (REFLEX): RBC / HPF: NONE SEEN RBC/hpf (ref 0–5)

## 2022-02-25 MED ORDER — METHOCARBAMOL 500 MG PO TABS: 500.0000 mg | ORAL_TABLET | Freq: Two times a day (BID) | ORAL | 0 refills | Status: DC

## 2022-02-25 MED ORDER — KETOROLAC TROMETHAMINE 15 MG/ML IJ SOLN
15.0000 mg | Freq: Once | INTRAMUSCULAR | Status: AC
  Administered 2022-02-25: 15 mg via INTRAMUSCULAR
  Filled 2022-02-25: qty 1

## 2022-02-25 MED ORDER — MELOXICAM 7.5 MG PO TABS: 7.5000 mg | ORAL_TABLET | Freq: Every day | ORAL | 0 refills | Status: DC

## 2022-02-25 NOTE — ED Triage Notes (Signed)
Pt arrives pov, steady gait, c/o left lower back pain x 3 days, left left radiating leg pain today. Denies injury, denies dysuria ?

## 2022-02-25 NOTE — Discharge Instructions (Signed)
Please read and follow all provided instructions. ? ?Your diagnoses today include:  ?1. Acute left-sided low back pain with left-sided sciatica   ? ? ?Tests performed today include: ?Vital signs - see below for your results today ? ?Medications prescribed:  ?Meloxicam - anti-inflammatory pain medication ? ?You have been prescribed an anti-inflammatory medication or NSAID. Take with food. Do not take aspirin, ibuprofen, or naproxen if taking this medication. Take smallest effective dose for the shortest duration needed for your pain. Stop taking if you experience stomach pain or vomiting.  ? ?Robaxin (methocarbamol) - muscle relaxer medication ? ?DO NOT drive or perform any activities that require you to be awake and alert because this medicine can make you drowsy.  ? ?Take any prescribed medications only as directed. ? ?Home care instructions:  ?Follow any educational materials contained in this packet ?Please rest, use ice or heat on your back for the next several days ?Do not lift, push, pull anything more than 10 pounds for the next week ? ?Follow-up instructions: ?Please follow-up with your primary care provider in the next 1 week for further evaluation of your symptoms.  ? ?Return instructions:  ?SEEK IMMEDIATE MEDICAL ATTENTION IF YOU HAVE: ?New numbness, tingling, weakness, or problem with the use of your arms or legs ?Severe back pain not relieved with medications ?Loss control of your bowels or bladder ?Increasing pain in any areas of the body (such as chest or abdominal pain) ?Shortness of breath, dizziness, or fainting.  ?Worsening nausea (feeling sick to your stomach), vomiting, fever, or sweats ?Any other emergent concerns regarding your health  ? ?Additional Information: ? ?Your vital signs today were: ?BP 111/80 (BP Location: Left Arm)   Pulse (!) 106   Temp 97.9 ?F (36.6 ?C) (Oral)   Resp 18   Ht 5\' 7"  (1.702 m)   Wt 92.5 kg   LMP 04/17/2016   SpO2 100%   BMI 31.95 kg/m?  ?If your blood  pressure (BP) was elevated above 135/85 this visit, please have this repeated by your doctor within one month. ?-------------- ? ?

## 2022-02-25 NOTE — ED Notes (Signed)
Patient Alert and oriented to baseline. Stable and ambulatory to baseline. Patient verbalized understanding of the discharge instructions.  Patient belongings were taken by the patient.   

## 2022-02-25 NOTE — ED Provider Notes (Signed)
?[facility] EMERGENCY DEPARTMENT ?Provider Note ? ? ?CSN: [id number] ?Arrival date & time: 02/25/22  1632 ? ?  ? ?History ? ?Chief Complaint  ?Patient presents with  ? Back Pain  ? ? ?Alicia Cook is a 56 y.o. female. ? ?Patient presents to the emergency department today for evaluation of left-sided lower back pain with radiation into her left leg.  She has a history of sarcoidosis and nonischemic cardiomyopathy/CHF.  States that she recently completed a course of prednisone for a rash.  Patient works 2 jobs and is on her feet a lot.  She has developed a dull aching pain in her left lower back.  This is very tender to palpation and with certain positions.  Over the past day she has developed a dull throbbing pain into her left thigh down to her knee.  She denies urinary symptoms.  Patient denies warning symptoms of back pain including: fecal incontinence, urinary retention or overflow incontinence, night sweats, waking from sleep with back pain, unexplained fevers or weight loss, h/o cancer, IVDU, recent trauma. Patient denies risk factors for pulmonary embolism including: unilateral leg swelling, history of DVT/PE/other blood clots, use of exogenous hormones, recent immobilizations, recent surgery, recent travel (>4hr segment), malignancy, hemoptysis.  ? ? ? ? ? ?  ? ?Home Medications ?Prior to Admission medications   ?Medication Sig Start Date End Date Taking? Authorizing Provider  ?meloxicam (MOBIC) 7.5 MG tablet Take 1 tablet (7.5 mg total) by mouth daily. 02/25/22  Yes Renne Crigler, PA-C  ?methocarbamol (ROBAXIN) 500 MG tablet Take 1 tablet (500 mg total) by mouth 2 (two) times daily. 02/25/22  Yes Renne Crigler, PA-C  ?BIOTIN PO Take 1 capsule by mouth daily with lunch.    [provider]  ?CALCIUM PO Take 1 tablet by mouth 2 (two) times daily. chewable    [provider]  ?digoxin (LANOXIN) 0.125 MG tablet Take 1 tablet (0.125 mg total) by mouth daily. 07/28/21   [place],  Celeste C, PA-C  ?Ferrous Sulfate (IRON PO) Take 1 tablet by mouth daily.    [provider]  ?folic acid (FOLVITE) 1 MG tablet Take 3 mg by mouth daily with lunch.    [provider]  ?hydrOXYzine (VISTARIL) 25 MG capsule Take 25 mg by mouth 2 (two) times daily as needed for anxiety. 11/26/20   [provider]  ?JARDIANCE 25 MG TABS tablet Take 25 mg by mouth daily with lunch. 02/14/21   [provider]  ?magnesium hydroxide (MILK OF MAGNESIA) 400 MG/5ML suspension Take 30 mLs by mouth daily as needed for mild constipation.    [provider]  ?methotrexate (RHEUMATREX) 2.5 MG tablet Take 20 mg by mouth every Sunday. Caution:Chemotherapy. Protect from light.    [provider]  ?midodrine (PROAMATINE) 10 MG tablet TAKE 1 TABLET (10 MG TOTAL) BY MOUTH WITH BREAKFAST, WITH LUNCH, AND WITH EVENING MEAL. 02/21/22   Yates Decamp, MD  ?Multiple Vitamin (MULTIVITAMIN WITH MINERALS) TABS tablet Take 1 tablet by mouth daily with lunch.    [provider]  ?pantoprazole (PROTONIX) 40 MG tablet Take 40 mg by mouth daily as needed (acid reflux). 01/23/20   [provider]  ?Potassium Chloride ER 20 MEQ TBCR Take 20 mEq by mouth daily as needed. Take when you take demadex 07/19/21   Rodolph Bong, MD  ?Semaglutide, 2 MG/DOSE, (OZEMPIC, 2 MG/DOSE,) 8 MG/3ML SOPN Inject 2 mg into the skin every Sunday.    [provider]  ?Sertraline  HCl 200 MG CAPS Take 200 mg by mouth daily with lunch.    [provider]  ?torsemide (DEMADEX) 20 MG tablet Take 1 tablet (20 mg total) by mouth daily as needed. ?Patient taking differently: Take 10 mg by mouth daily. 07/19/21 07/14/22  Rodolph Bong, MD  ?vitamin B-12 (CYANOCOBALAMIN) 500 MCG tablet Take 500 mcg by mouth daily with lunch.    [provider]  ?Vitamin D, Ergocalciferol, (DRISDOL) 1.25 MG (50000 UT) CAPS capsule Take 50,000 Units by mouth every Tuesday. 09/20/18   [provider]  ?   ? ?Allergies    ?Patient has no known allergies.   ? ?Review of Systems   ?Review of Systems ? ?Physical Exam ?Updated Vital Signs ?BP 119/74   Pulse 97   Temp 98 ?F (36.7 ?C)   Resp 16   Ht 5\' 7"  (1.702 m)   Wt 92.5 kg   LMP 04/17/2016   SpO2 99%   BMI 31.95 kg/m?  ?Physical Exam ?Vitals and nursing note reviewed.  ?Constitutional:   ?   Appearance: She is well-developed.  ?HENT:  ?   Head: Normocephalic and atraumatic.  ?Eyes:  ?   Conjunctiva/sclera: Conjunctivae normal.  ?Pulmonary:  ?   Effort: Pulmonary effort is normal.  ?Abdominal:  ?   Palpations: Abdomen is soft.  ?   Tenderness: There is no abdominal tenderness.  ?Musculoskeletal:     ?   General: Normal range of motion.  ?   Cervical back: Normal range of motion and neck supple. No spasms or tenderness. Normal range of motion.  ?   Thoracic back: No spasms or tenderness. Normal range of motion.  ?   Right upper leg: No edema or tenderness.  ?   Left upper leg: No edema or tenderness.  ?   Left knee: No effusion. Tenderness present. No medial joint line tenderness.  ?   Comments: No step-off noted with palpation of spine.  Patient with mid to upper lumbar spine paraspinous tenderness on the left.  Patient winces and jumps in pain when I press on certain areas over this musculature.  ?Skin: ?   General: Skin is warm and dry.  ?   Findings: No rash.  ?Neurological:  ?   Mental Status: She is alert.  ?   Sensory: No sensory deficit.  ?   Deep Tendon Reflexes: Reflexes are normal and symmetric.  ?   Comments: 5/5 strength in entire lower extremities bilaterally. No sensation deficit.   ? ? ?ED Results / Procedures / Treatments   ?Labs ?(all labs ordered are listed, but only abnormal results are displayed) ?Labs Reviewed  ?URINALYSIS, ROUTINE W REFLEX MICROSCOPIC - Abnormal; Notable for the following components:  ?    Result Value  ? Glucose, UA >=500 (*)   ? All other components within normal limits  ?URINALYSIS, MICROSCOPIC (REFLEX) -  Abnormal; Notable for the following components:  ? Bacteria, UA MANY (*)   ? All other components within normal limits  ? ? ?EKG ?None ? ?Radiology ?No results found. ? ?Procedures ?Procedures  ? ? ?Medications Ordered in ED ?Medications  ?ketorolac (TORADOL) 15 MG/ML injection 15 mg (15 mg Intramuscular Given 02/25/22 1844)  ? ? ?ED Course/ Medical Decision Making/ A&P ?  ? ?Patient seen and examined. History obtained directly from patient.  ? ?Labs/EKG: UA ordered in triage, negative. ? ?Imaging: Considered imaging of the lower back, however patient without trauma or other red flags.  We  discussed the possibility of a lower extremity DVT.  Patient does not have risk factors for this.  Overall I feel that this is low risk.  Offered lower extremity ultrasound but patient with like to defer at this time. ? ?Medications/Fluids: Ordered: IM Toradol. ? ?Most recent vital signs reviewed and are as follows: ?BP 119/74   Pulse 97   Temp 98 ?F (36.7 ?C)   Resp 16   Ht 5\' 7"  (1.702 m)   Wt 92.5 kg   LMP 04/17/2016   SpO2 99%   BMI 31.95 kg/m?  ? ?Initial impression: Lower back pain with suspected left sided radiculopathy. ? ?Plan: Discharge to home.  ? ?Prescriptions written for: Meloxicam, Robaxin ? ?Patient counseled on proper use of muscle relaxant medication.  They were told not to drink alcohol, drive any vehicle, or do any dangerous activities while taking this medication.  Patient verbalized understanding. ? ?Other home care instructions discussed: Patient was counseled on back pain precautions and told to do activity as tolerated but do not lift, push, or pull heavy objects more than 10 pounds for the next week.  Patient counseled to use ice or heat on back for no longer than 15 minutes every hour.  ? ?Patient counseled on proper use of muscle relaxant medication.  They were told not to drink alcohol, drive any vehicle, or do any dangerous activities while taking this medication.  Patient verbalized  understanding. ? ?Patient urged to follow-up with PCP if pain does not improve with treatment and rest or if pain becomes recurrent. Urged to return with worsening severe pain, loss of bowel or bladder control, trouble walking.  ? ?The pa

## 2022-03-07 ENCOUNTER — Telehealth: Payer: Self-pay

## 2022-03-07 DIAGNOSIS — I5022 Chronic systolic (congestive) heart failure: Secondary | ICD-10-CM

## 2022-03-07 NOTE — Telephone Encounter (Signed)
Pt aware of instructions.//ah

## 2022-03-07 NOTE — Telephone Encounter (Signed)
Pt cant catch breath x 2 days, fills full. o2 is 100% takes fluid pill prn but took one last night. Wants to know if needs to take 2 instead of 1. Plz advise//ah ?

## 2022-03-12 ENCOUNTER — Encounter (HOSPITAL_BASED_OUTPATIENT_CLINIC_OR_DEPARTMENT_OTHER): Payer: Self-pay | Admitting: Emergency Medicine

## 2022-03-12 ENCOUNTER — Emergency Department (HOSPITAL_BASED_OUTPATIENT_CLINIC_OR_DEPARTMENT_OTHER): Payer: Medicaid Other

## 2022-03-12 ENCOUNTER — Emergency Department (HOSPITAL_BASED_OUTPATIENT_CLINIC_OR_DEPARTMENT_OTHER)
Admission: EM | Admit: 2022-03-12 | Discharge: 2022-03-12 | Disposition: A | Discharge status: Home / Self Care | Payer: Medicaid Other | Attending: Emergency Medicine | Admitting: Emergency Medicine

## 2022-03-12 ENCOUNTER — Other Ambulatory Visit: Payer: Self-pay

## 2022-03-12 DIAGNOSIS — J90 Pleural effusion, not elsewhere classified: Secondary | ICD-10-CM | POA: Insufficient documentation

## 2022-03-12 DIAGNOSIS — R0602 Shortness of breath: Secondary | ICD-10-CM

## 2022-03-12 DIAGNOSIS — Z794 Long term (current) use of insulin: Secondary | ICD-10-CM | POA: Diagnosis not present

## 2022-03-12 DIAGNOSIS — I509 Heart failure, unspecified: Secondary | ICD-10-CM

## 2022-03-12 LAB — COMPREHENSIVE METABOLIC PANEL
ALT: 47 U/L — ABNORMAL HIGH (ref 0–44)
AST: 45 U/L — ABNORMAL HIGH (ref 15–41)
Albumin: 3.6 g/dL (ref 3.5–5.0)
Alkaline Phosphatase: 105 U/L (ref 38–126)
Anion gap: 5 (ref 5–15)
BUN: 17 mg/dL (ref 6–20)
CO2: 28 mmol/L (ref 22–32)
Calcium: 8.6 mg/dL — ABNORMAL LOW (ref 8.9–10.3)
Chloride: 108 mmol/L (ref 98–111)
Creatinine, Ser: 0.8 mg/dL (ref 0.44–1.00)
GFR, Estimated: >60 mL/min (ref >60)
Glucose, Bld: 96 mg/dL (ref 70–99)
Potassium: 3.5 mmol/L (ref 3.5–5.1)
Sodium: 141 mmol/L (ref 135–145)
Total Bilirubin: 0.6 mg/dL (ref 0.3–1.2)
Total Protein: 6.6 g/dL (ref 6.5–8.1)

## 2022-03-12 LAB — DIGOXIN LEVEL: Digoxin Level: 0.2 ng/mL — ABNORMAL LOW (ref 0.8–2.0)

## 2022-03-12 LAB — CBC WITH DIFFERENTIAL/PLATELET
Abs Immature Granulocytes: 0.02 10*3/uL (ref 0.00–0.07)
Basophils Absolute: 0 10*3/uL (ref 0.0–0.1)
Basophils Relative: 0 %
Eosinophils Absolute: 0.3 10*3/uL (ref 0.0–0.5)
Eosinophils Relative: 4 %
HCT: 38.3 % (ref 36.0–46.0)
Hemoglobin: 12.6 g/dL (ref 12.0–15.0)
Immature Granulocytes: 0 %
Lymphocytes Relative: 23 %
Lymphs Abs: 1.6 10*3/uL (ref 0.7–4.0)
MCH: 29.2 pg (ref 26.0–34.0)
MCHC: 32.9 g/dL (ref 30.0–36.0)
MCV: 88.9 fL (ref 80.0–100.0)
Monocytes Absolute: 0.8 10*3/uL (ref 0.1–1.0)
Monocytes Relative: 11 %
Neutro Abs: 4.4 10*3/uL (ref 1.7–7.7)
Neutrophils Relative %: 62 %
Platelets: 192 10*3/uL (ref 150–400)
RBC: 4.31 MIL/uL (ref 3.87–5.11)
RDW: 13.8 % (ref 11.5–15.5)
WBC: 7.1 10*3/uL (ref 4.0–10.5)
nRBC: 0 % (ref 0.0–0.2)

## 2022-03-12 LAB — BRAIN NATRIURETIC PEPTIDE: B Natriuretic Peptide: 604.1 pg/mL — ABNORMAL HIGH (ref 0.0–100.0)

## 2022-03-12 LAB — TROPONIN I (HIGH SENSITIVITY)
Troponin I (High Sensitivity): 31 ng/L — ABNORMAL HIGH (ref <18)
Troponin I (High Sensitivity): 24 ng/L — ABNORMAL HIGH (ref <18)

## 2022-03-12 LAB — D-DIMER, QUANTITATIVE: D-Dimer, Quant: 0.95 ug/mL-FEU — ABNORMAL HIGH (ref 0.00–0.50)

## 2022-03-12 MED ORDER — FUROSEMIDE 10 MG/ML IJ SOLN
80.0000 mg | Freq: Once | INTRAMUSCULAR | Status: AC
  Administered 2022-03-12: 80 mg via INTRAVENOUS
  Filled 2022-03-12: qty 8

## 2022-03-12 MED ORDER — IOHEXOL 350 MG/ML SOLN
75.0000 mL | Freq: Once | INTRAVENOUS | Status: AC | PRN
  Administered 2022-03-12: 75 mL via INTRAVENOUS

## 2022-03-12 NOTE — ED Triage Notes (Signed)
Pt reports SHOB ~1 week that has worsened. Pt states she took 40 mg Torsemide (double her usual dose) today without relief. Pt states the [facility] is affecting her ability to have normal physical activity. Denies any peripheral edema. Oxygen saturation 98% on RA.  ?

## 2022-03-12 NOTE — ED Provider Notes (Signed)
?[facility] EMERGENCY DEPARTMENT ?Provider Note ? ? ?CSN: [id number] ?Arrival date & time: 03/12/22  0429 ? ?  ? ?History ? ?Chief Complaint  ?Patient presents with  ? Shortness of Breath  ? ? ?Carlasia Norenberg is a 56 y.o. female. ? ?56 year old female who presents the ER today with shortness of breath.  States been going on for approximately 5 days and progressively worsening.  The point now she is having trouble laying flat to go to sleep and scares her.  She doubled her torsemide a couple days ago and has been urinating more but her symptoms have not been improving.  She has no lower extremity swelling but she usually does not develop edema there she usually gets in her lungs and her abdomen.  She does feel her abdomen slightly distended compared to normal.  No fevers or cough.  No chest pain.  No other associated symptoms. ? ? ?Shortness of Breath ? ?  ? ?Home Medications ?Prior to Admission medications   ?Medication Sig Start Date End Date Taking? Authorizing Provider  ?BIOTIN PO Take 1 capsule by mouth daily with lunch.    [provider]  ?CALCIUM PO Take 1 tablet by mouth 2 (two) times daily. chewable    [provider]  ?digoxin (LANOXIN) 0.125 MG tablet Take 1 tablet (0.125 mg total) by mouth daily. 07/28/21   Cantwell, Celeste C, PA-C  ?Ferrous Sulfate (IRON PO) Take 1 tablet by mouth daily.    [provider]  ?folic acid (FOLVITE) 1 MG tablet Take 3 mg by mouth daily with lunch.    [provider]  ?hydrOXYzine (VISTARIL) 25 MG capsule Take 25 mg by mouth 2 (two) times daily as needed for anxiety. 11/26/20   [provider]  ?JARDIANCE 25 MG TABS tablet Take 25 mg by mouth daily with lunch. 02/14/21   [provider]  ?magnesium hydroxide (MILK OF MAGNESIA) 400 MG/5ML suspension Take 30 mLs by mouth daily as needed for mild constipation.    [provider]  ?meloxicam (MOBIC) 7.5 MG tablet Take 1 tablet (7.5 mg total) by mouth daily.  02/25/22   Renne Crigler, PA-C  ?methocarbamol (ROBAXIN) 500 MG tablet Take 1 tablet (500 mg total) by mouth 2 (two) times daily. 02/25/22   Renne Crigler, PA-C  ?methotrexate (RHEUMATREX) 2.5 MG tablet Take 20 mg by mouth every Sunday. Caution:Chemotherapy. Protect from light.    [provider]  ?midodrine (PROAMATINE) 10 MG tablet TAKE 1 TABLET (10 MG TOTAL) BY MOUTH WITH BREAKFAST, WITH LUNCH, AND WITH EVENING MEAL. 02/21/22   Yates Decamp, MD  ?Multiple Vitamin (MULTIVITAMIN WITH MINERALS) TABS tablet Take 1 tablet by mouth daily with lunch.    [provider]  ?pantoprazole (PROTONIX) 40 MG tablet Take 40 mg by mouth daily as needed (acid reflux). 01/23/20   [provider]  ?Potassium Chloride ER 20 MEQ TBCR Take 20 mEq by mouth daily as needed. Take when you take demadex 07/19/21   Rodolph Bong, MD  ?Semaglutide, 2 MG/DOSE, (OZEMPIC, 2 MG/DOSE,) 8 MG/3ML SOPN Inject 2 mg into the skin every Sunday.    [provider]  ?Sertraline HCl 200 MG CAPS Take 200 mg by mouth daily with lunch.    [provider]  ?torsemide (DEMADEX) 20 MG tablet Take 1 tablet (20 mg total) by mouth daily as needed. ?Patient taking differently: Take 10 mg by mouth daily. 07/19/21 07/14/22  Rodolph Bong, MD  ?vitamin B-12 (CYANOCOBALAMIN) 500 MCG  tablet Take 500 mcg by mouth daily with lunch.    [provider]  ?Vitamin D, Ergocalciferol, (DRISDOL) 1.25 MG (50000 UT) CAPS capsule Take 50,000 Units by mouth every Tuesday. 09/20/18   [provider]  ?   ? ?Allergies    ?Patient has no known allergies.   ? ?Review of Systems   ?Review of Systems  ?Respiratory:  Positive for shortness of breath.   ? ?Physical Exam ?Updated Vital Signs ?BP 121/86   Pulse 92   Temp 98.8 ?F (37.1 ?C) (Oral)   Resp (!) 22   Ht 5\' 7"  (1.702 m)   Wt 94.8 kg   LMP 04/17/2016   SpO2 92%   BMI 32.73 kg/m?  ?Physical Exam ?Vitals and nursing note reviewed.  ?Constitutional:   ?    Appearance: She is well-developed.  ?HENT:  ?   Head: Normocephalic and atraumatic.  ?Cardiovascular:  ?   Rate and Rhythm: Normal rate and regular rhythm.  ?Pulmonary:  ?   Effort: Tachypnea present. No respiratory distress.  ?   Breath sounds: No stridor.  ?Chest:  ?   Chest wall: No mass or tenderness.  ?Abdominal:  ?   General: There is no distension.  ?Musculoskeletal:  ?   Cervical back: Normal range of motion.  ?Skin: ?   General: Skin is warm and dry.  ?Neurological:  ?   General: No focal deficit present.  ?   Mental Status: She is alert.  ? ? ?ED Results / Procedures / Treatments   ?Labs ?(all labs ordered are listed, but only abnormal results are displayed) ?Labs Reviewed  ?COMPREHENSIVE METABOLIC PANEL - Abnormal; Notable for the following components:  ?    Result Value  ? Calcium 8.6 (*)   ? AST 45 (*)   ? ALT 47 (*)   ? All other components within normal limits  ?D-DIMER, QUANTITATIVE - Abnormal; Notable for the following components:  ? D-Dimer, Quant 0.95 (*)   ? All other components within normal limits  ?BRAIN NATRIURETIC PEPTIDE - Abnormal; Notable for the following components:  ? B Natriuretic Peptide 604.1 (*)   ? All other components within normal limits  ?DIGOXIN LEVEL - Abnormal; Notable for the following components:  ? Digoxin Level 0.2 (*)   ? All other components within normal limits  ?TROPONIN I (HIGH SENSITIVITY) - Abnormal; Notable for the following components:  ? Troponin I (High Sensitivity) 31 (*)   ? All other components within normal limits  ?CBC WITH DIFFERENTIAL/PLATELET  ? ? ?EKG ?EKG Interpretation ? 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 ?Radiology ?DG Chest 2 View ? ?Result Date: 03/12/2022 ?CLINICAL DATA:  Short of breath. EXAM: CHEST - 2 VIEW COMPARISON:  10/05/2021 FINDINGS: Left chest wall ICD noted with leads in the right ventricle, coronary sinus and right atrium. Stable cardiac enlargement. Small pleural effusions and pulmonary vascular congestion is noted. Suspicious for early CHF. No airspace opacities. IMPRESSION: Suspect early CHF. Electronically Signed   By: 14/04/2021 M.D.   On: 03/12/2022 06:03   ? ?Procedures ?Procedures  ? ? ?Medications Ordered in ED ?Medications  ?furosemide (LASIX) injection 80  mg (80 mg Intravenous Given 03/12/22 0616)  ? ? ?ED Course/ Medical Decision Making/ A&P ?  ?                        ?Medical Decision Making ?Amount and/or Complexity of Data Reviewed ?Labs: ordered. ?Radiology: ordered. ? ?Risk ?Prescription drug management. ? ? ?Likely fluid overload but her lungs do not sound too bad for her to be so symptomatic so D-dimer was checked which was elevated so we will need to get CT scan.  Chest x-ray does show some small pleural effusions with likely vascular congestion to me on my review of it so we will give her some IV Lasix. ? ?Care transferred pending CT and reeval for improvement in breathing. Likely discharge.  ? ?Final Clinical Impression(s) / ED Diagnoses ?Final diagnoses:  ?None  ? ? ?Rx / DC Orders ?ED Discharge Orders   ? ? None  ? ?  ? ? ?  ?Marily Memos, MD ?03/12/22 2331 ? ?

## 2022-03-12 NOTE — ED Notes (Signed)
Resting quietly in room, closing eyes intermittently, sr x 2 up, nurse call bell within reach. Cont to await final lab results. Appears comfortable. Cont on cardiac monitor, indicates a regular/ irregular wide bundle branch block rhythm ?

## 2022-03-12 NOTE — ED Provider Notes (Signed)
Patient here with shortness of breath.  Thus far work-up reveals likely mild CHF exacerbation.  She is minimally symptomatic.  Not hypoxic.  No respiratory distress.  BNP and troponin mildly elevated above baseline.  She had a PE scan of her chest that shows signs consistent with volume overload.  She is already been given a IV dose of Lasix with great response.  She is on torsemide 20 mg as needed.   ? ?Repeat troponin is improved.  Having good response to Lasix.  We will have her increase her torsemide here the next several days and follow-up with primary care doctor, cardiology.  Discharged in good condition.  Overall suspect symptoms secondary to volume overload.  However no respiratory distress, normal vitals.  Well-appearing. ?  Virgina Norfolk, DO ?03/12/22 0840 ? ?

## 2022-03-12 NOTE — Discharge Instructions (Addendum)
Recommend taking torsemide 20 mg twice a day for the next 2 days and then taking 20 daily for the next 2 to 3 days and follow-up with your primary care doctor or your cardiologist to further discuss torsemide dosage.  You should have blood work checked at that time as well.  Please return if symptoms worsen as discussed. ?

## 2022-03-18 ENCOUNTER — Other Ambulatory Visit: Payer: Self-pay | Admitting: Cardiology

## 2022-04-03 ENCOUNTER — Encounter: Payer: Self-pay | Admitting: Student

## 2022-04-03 ENCOUNTER — Ambulatory Visit: Payer: Medicaid Other | Admitting: Student

## 2022-04-03 VITALS — BP 95/64 | HR 98 | Temp 97.9°F | Resp 16 | Ht 67.0 in | Wt 212.2 lb

## 2022-04-03 DIAGNOSIS — I5022 Chronic systolic (congestive) heart failure: Secondary | ICD-10-CM

## 2022-04-03 DIAGNOSIS — I428 Other cardiomyopathies: Secondary | ICD-10-CM

## 2022-04-03 DIAGNOSIS — Z95810 Presence of automatic (implantable) cardiac defibrillator: Secondary | ICD-10-CM

## 2022-04-03 MED ORDER — IVABRADINE HCL 7.5 MG PO TABS: 7.5000 mg | ORAL_TABLET | Freq: Two times a day (BID) | ORAL | 3 refills | Status: DC

## 2022-04-03 MED ORDER — SPIRONOLACTONE 25 MG PO TABS: 25.0000 mg | ORAL_TABLET | Freq: Every day | ORAL | 3 refills | Status: DC

## 2022-04-03 NOTE — Patient Instructions (Addendum)
Stop Jardiance  Start spironolactone  Start Corlanor 7.5 mg 2x daily  Continue Digoxin Continue Torsemide 20 mg 2x daily  Labs in 1 week

## 2022-04-03 NOTE — Progress Notes (Signed)
Primary Physician/Referring:  [name], MD  Patient ID: Alicia Cook, female    DOB: 1966-10-03, 56 y.o.   MRN: [id number]  Chief Complaint  Patient presents with   Congestive Heart Failure   Follow-up      HPI:    Alicia Cook  is a 56 y.o. 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 Cardiac MRI performed in 2011 and cardiac PET scan in August 2017 does not appear to be consistent with cardiac sarcoidosis.  She had repeat PET scan performed at [facility] on 03/05/2020 again confirming this.  She also has uncontrolled diabetes, morbid obesity, mild sleep apnea  but not tolerating CPAP, systemic  hypertension.  Patient underwent laparoscopic gastric sleeve on 12/20/2020 and so far has lost about 60 pounds in weight.  Heart failure medications have previously been stopped during hospital admission and 06/2021 due to hypotension.  Patient has undergone PET scan to evaluate for underlying cardiac sarcoidosis, no evidence of this.  Patient presents today after ED visit on 03/14/2019, at which time she exhibited evidence of acute on chronic heart failure with pulmonary edema and weight trending up.  Patient is currently taking torsemide 20 mg p.o. twice daily.  She continues to experience orthopnea and her weight has continued to trend up an additional 3 pounds since ED evaluation.  She also reports coughing and wheezing at night.  She is currently taking midodrine 10 mg 2-3 times per day.  Past Medical History:  Diagnosis Date   Cataract    CHF (congestive heart failure) ([place])    Depression    Diabetes mellitus without complication ([place])    Encounter for adjustment of biventricular implantable cardioverter-defibrillator (ICD) 06/13/2019   Hypertension    ICD: Cardiac defibrillator in situ 01/13/2015    [employer] Bi-V ICD (Dynagen X4 CRT-D) 01/13/2015 at [facility] - MRI Compatible  Scheduled Remote ICD check 4.22.20: 1 SVT/8 secs, normal function. Battery longevity is 8 years. RA pacing is 1 %, RV pacing is 99 %, and LV pacing is 99 %.   Nonischemic cardiomyopathy ([place])    a. EF 35-40% by cath in 2011 with normal cors b. s/p ICD implantation in 12/2014 c. EF 25% by NST in 02/01/2016   Sarcoidosis    Sarcoidosis 02/08/2016   Past Surgical History:  Procedure Laterality Date   ABDOMINAL SURGERY  12/20/2020    laparoscopic sleeve gastrectomy   CARDIAC CATHETERIZATION N/A 02/08/2016   Procedure: Left Heart Cath and Coronary Angiography;  Surgeon: Jettie Booze, MD;  Location: [place] [facility];  Service: Cardiovascular;  Laterality: N/A;   CATARACT EXTRACTION Right 12/04/2018   CESAREAN SECTION     CHOLECYSTECTOMY     INSERTION OF ICD     a. s/p dual-chamber [employer] ICD 12/2014   LAPAROSCOPIC GASTRIC SLEEVE RESECTION     PACEMAKER INSERTION     RIGHT/LEFT HEART CATH AND CORONARY ANGIOGRAPHY N/A 07/12/2021   Procedure: RIGHT/LEFT HEART CATH AND CORONARY ANGIOGRAPHY;  Surgeon: Nigel Mormon, MD;  Location: [place] [facility];  Service: Cardiovascular;  Laterality: N/A;   TUBAL LIGATION     Family History  Problem Relation Age of Onset   Heart attack Father        a. Initial onset in his 50's. S/p CABG   Heart failure Father     Social History  Tobacco Use   Smoking status: Former    Packs/day: 1.00    Years: 20.00    Pack years: 20.00    Types: Cigarettes    Quit date: 2016    Years since quitting: 7.4   Smokeless tobacco: Never  Substance Use Topics   Alcohol use: No    Alcohol/week: 0.0 standard drinks   Marital Status: Single  ROS  Review of Systems  Constitutional: Positive for weight gain. Negative for malaise/fatigue and weight loss (intentional).  Cardiovascular:  Positive for dyspnea on exertion, orthopnea and paroxysmal  nocturnal dyspnea. Negative for chest pain, claudication, leg swelling, near-syncope, palpitations and syncope.  Respiratory:  Negative for wheezing.   Neurological:  Negative for dizziness.  Objective  Blood pressure 95/64, pulse 98, temperature 97.9 F (36.6 C), temperature source Temporal, resp. rate 16, height 5' 7"  (1.702 m), weight 212 lb 3.2 oz (96.3 kg), last menstrual period 04/17/2016, SpO2 99 %.     04/03/2022   11:38 AM 03/12/2022    8:30 AM 03/12/2022    8:20 AM  Vitals with BMI  Height 5' 7"     Weight 212 lbs 3 oz    BMI 91.79    Systolic 95 150 569  Diastolic 64 81 78  Pulse 98 84 91   Orthostatic VS for the past 72 hrs (Last 3 readings):  Patient Position BP Location Cuff Size  04/03/22 1138 Sitting Left Arm Large     Physical Exam Vitals reviewed.  Constitutional:      General: She is not in acute distress.    Appearance: She is well-developed. She is obese.     Comments: Morbidly obese in no acute distress.  Neck:     Thyroid: No thyromegaly.     Vascular: No carotid bruit or JVD.  Cardiovascular:     Rate and Rhythm: Normal rate and regular rhythm.     Pulses: Intact distal pulses.          Carotid pulses are 2+ on the right side and 2+ on the left side.      Dorsalis pedis pulses are 2+ on the right side and 2+ on the left side.       Posterior tibial pulses are 2+ on the right side and 2+ on the left side.     Heart sounds: Normal heart sounds, S1 normal and S2 normal. No murmur heard.   No gallop.  Pulmonary:     Effort: Pulmonary effort is normal. No accessory muscle usage.     Breath sounds: Normal breath sounds. No wheezing or rales.  Musculoskeletal:     Right lower leg: No edema.     Left lower leg: No edema.   Laboratory examination:      Latest Ref Rng & Units 03/12/2022    5:27 AM 10/05/2021    7:48 AM 07/19/2021    3:56 AM  CMP  Glucose 70 - 99 mg/dL 96   101   84    BUN 6 - 20 mg/dL 17   13   9     Creatinine 0.44 - 1.00 mg/dL 0.80    0.73   0.75    Sodium 135 - 145 mmol/L 141   140   141    Potassium 3.5 - 5.1 mmol/L 3.5   3.4   4.5    Chloride 98 - 111 mmol/L 108   106   106    CO2 22 - 32 mmol/L 28   26  27    Calcium 8.9 - 10.3 mg/dL 8.6   9.2   9.1    Total Protein 6.5 - 8.1 g/dL 6.6      Total Bilirubin 0.3 - 1.2 mg/dL 0.6      Alkaline Phos 38 - 126 U/L 105      AST 15 - 41 U/L 45      ALT 0 - 44 U/L 47          Latest Ref Rng & Units 03/12/2022    5:27 AM 10/05/2021    7:48 AM 07/19/2021    3:56 AM  CBC  WBC 4.0 - 10.5 K/uL 7.1   11.4   8.2    Hemoglobin 12.0 - 15.0 g/dL 12.6   13.8   12.3    Hematocrit 36.0 - 46.0 % 38.3   42.0   37.6    Platelets 150 - 400 K/uL 192   169   237     Lipid Panel     Component Value Date/Time   CHOL 165 02/09/2016 0700   TRIG 94 02/09/2016 0700   HDL 64 02/09/2016 0700   CHOLHDL 2.6 02/09/2016 0700   VLDL 19 02/09/2016 0700   LDLCALC 82 02/09/2016 0700   HEMOGLOBIN A1C Lab Results  Component Value Date   HGBA1C 6.0 (H) 07/17/2021   MPG 125.5 07/17/2021   TSH No results for input(s): TSH in the last 8760 hours.  BNP    Component Value Date/Time   BNP 604.1 (H) 03/12/2022 0527    ProBNP    Component Value Date/Time   PROBNP 1,771 (H) 12/07/2021 1632    External labs:  06/14/2021: Sodium 140, potassium 3.4, glucose 79, BUN 18, creatinine 1.04, GFR 64 BNP 233 TSH 0.48  12/22/2020: Hb 12.1/HCT 36.8, platelets 227. Sodium 136, potassium 3.8, BUN 7, creatinine 0.69, EGFR >90 mL.  Serum glucose 106 mg.  08/31/2020: A1c 7.6%  07/20/2020: Glucose 101, sodium 139, potassium 4.2, BUN 12, creatinine 0.75, EGFR greater than 90 Hemoglobin 13.0, hematocrit 39.8, platelets 250  03/04/2020: BUN 14, creatinine 0.8, serum glucose 140 mg, EGFR >60 mL.  Magnesium 1.6. Total cholesterol 124, triglycerides 129, HDL 55, LDL 63.  Non-HDL cholesterol 69. Hb 13.1/HCT 39.3, platelets 207.  Allergies  No Known Allergies   Medications Prior to Visit:   Outpatient  Medications Prior to Visit  Medication Sig Dispense Refill   BIOTIN PO Take 1 capsule by mouth daily with lunch.     busPIRone (BUSPAR) 15 MG tablet Take 15 mg by mouth 2 (two) times daily.     CALCIUM PO Take 1 tablet by mouth 2 (two) times daily. chewable     digoxin (LANOXIN) 0.125 MG tablet Take 1 tablet (0.125 mg total) by mouth daily. 90 tablet 3   Ferrous Sulfate (IRON PO) Take 1 tablet by mouth daily.     folic acid (FOLVITE) 1 MG tablet Take 3 mg by mouth daily with lunch.     hydrOXYzine (VISTARIL) 25 MG capsule Take 25 mg by mouth 2 (two) times daily as needed for anxiety.     magnesium hydroxide (MILK OF MAGNESIA) 400 MG/5ML suspension Take 30 mLs by mouth daily as needed for mild constipation.     meloxicam (MOBIC) 7.5 MG tablet Take 1 tablet (7.5 mg total) by mouth daily. 10 tablet 0   methocarbamol (ROBAXIN) 500 MG tablet Take 1 tablet (500 mg total) by mouth 2 (two) times daily. 20 tablet 0   methotrexate (RHEUMATREX) 2.5  MG tablet Take 20 mg by mouth every Sunday. Caution:Chemotherapy. Protect from light.     midodrine (PROAMATINE) 10 MG tablet TAKE 1 TABLET (10 MG TOTAL) BY MOUTH WITH BREAKFAST, WITH LUNCH, AND WITH EVENING MEAL. 90 tablet 0   Multiple Vitamin (MULTIVITAMIN WITH MINERALS) TABS tablet Take 1 tablet by mouth daily with lunch.     pantoprazole (PROTONIX) 40 MG tablet Take 40 mg by mouth daily as needed (acid reflux).     Potassium Chloride ER 20 MEQ TBCR Take 20 mEq by mouth daily as needed. Take when you take demadex     Semaglutide, 2 MG/DOSE, (OZEMPIC, 2 MG/DOSE,) 8 MG/3ML SOPN Inject 2 mg into the skin every Sunday.     Sertraline HCl 200 MG CAPS Take 200 mg by mouth daily with lunch.     torsemide (DEMADEX) 20 MG tablet Take 1 tablet (20 mg total) by mouth daily as needed. (Patient taking differently: Take 10 mg by mouth daily.)     vitamin B-12 (CYANOCOBALAMIN) 500 MCG tablet Take 500 mcg by mouth daily with lunch.     Vitamin D, Ergocalciferol, (DRISDOL)  1.25 MG (50000 UT) CAPS capsule Take 50,000 Units by mouth every Tuesday.     JARDIANCE 25 MG TABS tablet Take 25 mg by mouth daily with lunch.     magnesium hydroxide (MILK OF MAGNESIA) 400 MG/5ML suspension Take 30 mLs by mouth daily as needed for mild constipation.     No facility-administered medications prior to visit.   Final Medications at End of Visit    Current Meds  Medication Sig   BIOTIN PO Take 1 capsule by mouth daily with lunch.   busPIRone (BUSPAR) 15 MG tablet Take 15 mg by mouth 2 (two) times daily.   CALCIUM PO Take 1 tablet by mouth 2 (two) times daily. chewable   digoxin (LANOXIN) 0.125 MG tablet Take 1 tablet (0.125 mg total) by mouth daily.   Ferrous Sulfate (IRON PO) Take 1 tablet by mouth daily.   folic acid (FOLVITE) 1 MG tablet Take 3 mg by mouth daily with lunch.   hydrOXYzine (VISTARIL) 25 MG capsule Take 25 mg by mouth 2 (two) times daily as needed for anxiety.   ivabradine (CORLANOR) 7.5 MG TABS tablet Take 1 tablet (7.5 mg total) by mouth 2 (two) times daily with a meal.   magnesium hydroxide (MILK OF MAGNESIA) 400 MG/5ML suspension Take 30 mLs by mouth daily as needed for mild constipation.   meloxicam (MOBIC) 7.5 MG tablet Take 1 tablet (7.5 mg total) by mouth daily.   methocarbamol (ROBAXIN) 500 MG tablet Take 1 tablet (500 mg total) by mouth 2 (two) times daily.   methotrexate (RHEUMATREX) 2.5 MG tablet Take 20 mg by mouth every Sunday. Caution:Chemotherapy. Protect from light.   midodrine (PROAMATINE) 10 MG tablet TAKE 1 TABLET (10 MG TOTAL) BY MOUTH WITH BREAKFAST, WITH LUNCH, AND WITH EVENING MEAL.   Multiple Vitamin (MULTIVITAMIN WITH MINERALS) TABS tablet Take 1 tablet by mouth daily with lunch.   pantoprazole (PROTONIX) 40 MG tablet Take 40 mg by mouth daily as needed (acid reflux).   Potassium Chloride ER 20 MEQ TBCR Take 20 mEq by mouth daily as needed. Take when you take demadex   Semaglutide, 2 MG/DOSE, (OZEMPIC, 2 MG/DOSE,) 8 MG/3ML SOPN Inject  2 mg into the skin every Sunday.   Sertraline HCl 200 MG CAPS Take 200 mg by mouth daily with lunch.   spironolactone (ALDACTONE) 25 MG tablet Take 1 tablet (25  mg total) by mouth daily.   torsemide (DEMADEX) 20 MG tablet Take 1 tablet (20 mg total) by mouth daily as needed. (Patient taking differently: Take 10 mg by mouth daily.)   vitamin B-12 (CYANOCOBALAMIN) 500 MCG tablet Take 500 mcg by mouth daily with lunch.   Vitamin D, Ergocalciferol, (DRISDOL) 1.25 MG (50000 UT) CAPS capsule Take 50,000 Units by mouth every Tuesday.   [DISCONTINUED] JARDIANCE 25 MG TABS tablet Take 25 mg by mouth daily with lunch.   Radiology:  CTA chest 03/12/2022: 1. No evidence for acute pulmonary embolus. 2. Cardiac enlargement, trace bilateral pleural effusions and mild pulmonary edema compatible with CHF.  DG Chest 2 View 04/19/2021 Stable cardiomegaly. Increased density posterior to the heart border at the lung base may represent atrial enlargement or increasing pericardial effusion.   Chest x-ray 06/08/2021: 1. Interval increase in size of the cardiac silhouette which may  reflect increased cardiomegaly or increased size of the known  pericardial effusion. Consider further evaluation with cardiac echo.  2. Tiny bilateral pleural effusions with bibasilar predominant  interstitial and airspace opacities likely reflecting pulmonary  edema.   Cardiac Studies:   Nuclear stress test 02/01/2016: No significant ST segment changes or arrhythmias were noted during stress  Negative ETT for ischemia at workload achieved  No significant chest pain symptoms reported  No significant arrhythmia noted  nuclear images report to follow   Right heart catheterization and myocardial biopsy 07/17/2016: Mild pulmonary hypertension, mildly elevated pulmonary capillary wedge pressure, biopsy negative for sarcoid.  Cardiac PET sarcoidosis with nuclear medicine perfusion 03/05/2020: A myocardial SPECT scan was performed at  rest and contains a large, severe perfusion defect involving the anterior, lateral and anterior lateral walls of the left ventricle, necessitating a FDG-PET viability study.  Physiologic distribution of radiotracer. No substantial accumulation of radiotracer in the area of perfusion defect. Other PET findings: Few FDG avid bilateral axillary lymph nodes, index right axillary node measuring 1 cm (SUV max 3.5). Additionally, there are a few hypermetabolic superior mediastinal lymph nodes. Index right upper paratracheal lymph node shows SUV max 2.2. CT: Few small nodules are noted in right upper lobe, similar to 12/02/2019 CT exam. Subsegmental atelectasis/scarring in left lower lobe. Left chest wall cardiac rhythm maintenance device with distal leads terminating in right atrial appendage and right ventricle and coronary sinus. Small fat-containing of umbilical hernia.   Right and left heart catheterization coronary angiography 07/12/2021: Right heart pressures: LV EDP is normal. RA: 0 mmHg RV: 19/0 mmHg PA: 21/3 mmHg, mPAP 10 mmHg PCW: 4 mmHg CO: 5.7 L/min CI: 2.7 L/min/m2  Conclusion:  Left dominant circulation No coronary artery disease Well compensated nonischemic cardiomyopathy  PET cardiac sarcoidosis 09/30/2021: 1.  Moderate perfusion defect in the lateral wall of the heart.  2.  No definite evidence of myocardial uptake to suggest active myocardial sarcoidosis.  3.  Left ventricular ejection fraction is 18%. Please correlate with cardiac ultrasound  4.  Multilevel FDG avid mediastinal and bilateral hilar lymphadenopathy most compatible with sequela of sarcoidosis.  PCV ECHOCARDIOGRAM COMPLETE 01/25/2022 Moderately depressed LV systolic function with visual EF 30-35%. Left ventricle cavity is dilated. Mild left ventricular hypertrophy. Regional wall motion abnormalities difficult to assess accurately due to dilated LV cavity, global hypokinesis, and pacemaker.  Doppler evidence of grade II  (pseudonormal) diastolic dysfunction, elevated LAP. Left atrial cavity is mildly dilated. IAS bows from left to right suggestive of elevated LAP. Moderate (Grade III) mitral regurgitation, posteriorly directed, wall-impinging jet. Mild tricuspid regurgitation.  No evidence of pulmonary hypertension. Small pericardial effusion, predominantly located posteriorly.  There is no hemodynamic significance. Compared to study 06/24/2021 LVEF improved form 25% to 30-35%, G1DD is now Grade 2, otherwise no significant change.   ICD   Scheduled Remote ICD check 10/05/2020:  There were 2 atrial high rate episodes detected. The longest lasted 47 seconds in duration. No EGMs available for review. There was a <1 % cumulative atrial arrhythmia burden. No VHR episodes.  Health trends (patient activity, heart rate variability, average heart rates) are stable. The review of the implantable cardiac monitoring data remains stable. Battery longevity is 6 years . RA pacing is 1 %, RV pacing is 98 %, and LV pacing is 98 %.  Remote BiV ICD transmission 03/14/2022: AP 0%, CRT 99%. Longevity 4 years. Lead impedance and thresholds within normal limits. There were no significant arrhythmias, no therapy. Brief atrial tachycardia episode, longest 5 minutes. No physiologic data available through the device. Normal ICD function.  EKG   04/03/2022: AV paced rhythm at rate of 95 bpm.  Biventricular pacing detected.  Wide QRS complex.  No further analysis.  Unchanged compared to previous.   03/12/2020: Underlying sinus rhythm normal sinus rhythm at the rate of 83 bpm, ventricularly paced rhythm.  No further analysis.  Biventricular pacemaker detected.    09/15/2019: V paced rhythm, no further analysis.   Assessment     ICD-10-CM   1. Chronic systolic CHF (congestive heart failure) (HCC)  I50.22 EKG 12-Lead    Brain natriuretic peptide    Basic metabolic panel    2. Nonischemic cardiomyopathy (HCC)  I42.8     3. ICD: [employer] Bi-V ICD (Dynagen X4 CRT-D) MRI compatible ICD in situ  Z95.810       Meds ordered this encounter  Medications   spironolactone (ALDACTONE) 25 MG tablet    Sig: Take 1 tablet (25 mg total) by mouth daily.    Dispense:  90 tablet    Refill:  3   ivabradine (CORLANOR) 7.5 MG TABS tablet    Sig: Take 1 tablet (7.5 mg total) by mouth 2 (two) times daily with a meal.    Dispense:  60 tablet    Refill:  3    Medications Discontinued During This Encounter  Medication Reason   magnesium hydroxide (MILK OF MAGNESIA) 400 MG/5ML suspension    JARDIANCE 25 MG TABS tablet Discontinued by provider     Recommendations:   Janee Ureste  is a 56 y.o. 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 Cardiac MRI performed in 2011 and cardiac PET scan in August 2017 does not appear to be consistent with cardiac sarcoidosis.  She had repeat PET scan performed at [facility] on 03/05/2020 again confirming this.  She also has uncontrolled diabetes, morbid obesity, mild sleep apnea  but not tolerating CPAP, systemic  hypertension.  Patient underwent laparoscopic gastric sleeve on 12/20/2020 and so far has lost about 60 pounds in weight.  Heart failure medications have previously been stopped during hospital admission and 06/2021 due to hypotension.  Patient has undergone PET scan to evaluate for underlying cardiac sarcoidosis, no evidence of this.  Patient presents today for urgent visit at her request with symptoms concerning for acute exacerbation of heart failure.  She is hypotensive.  We will stop Jardiance and start spironolactone 25 mg p.o. daily.  We will also start Corlanor 7.5 mg p.o. twice daily for improved heart rate control.  Advised patient to continue other medications including torsemide 20 mg p.o. twice daily.  We will  obtain repeat BNP and BMP.  We will continue to follow patient closely.  Reviewed recent ICD transmission with patient, details above.   Follow-up in 1 to 2 weeks, sooner if needed.  Patient was seen in collaboration with Dr. Einar Gip and he is in agreement with the plan.   Alicia Berthold, Alicia Cook 04/03/2022, 3:03 PM Office: [phone]

## 2022-04-18 ENCOUNTER — Encounter: Payer: Self-pay | Admitting: Cardiology

## 2022-04-18 ENCOUNTER — Ambulatory Visit: Payer: Medicaid Other | Admitting: Cardiology

## 2022-04-18 VITALS — BP 110/74 | HR 96 | Temp 98.2°F | Resp 16 | Ht 67.0 in | Wt 211.0 lb

## 2022-04-18 DIAGNOSIS — I5022 Chronic systolic (congestive) heart failure: Secondary | ICD-10-CM

## 2022-04-18 DIAGNOSIS — I951 Orthostatic hypotension: Secondary | ICD-10-CM

## 2022-04-18 DIAGNOSIS — Z95810 Presence of automatic (implantable) cardiac defibrillator: Secondary | ICD-10-CM

## 2022-04-18 DIAGNOSIS — I428 Other cardiomyopathies: Secondary | ICD-10-CM

## 2022-04-18 MED ORDER — MIDODRINE HCL 10 MG PO TABS: 10.0000 mg | ORAL_TABLET | Freq: Three times a day (TID) | ORAL | 0 refills | Status: DC | PRN

## 2022-04-18 MED ORDER — CARVEDILOL 6.25 MG PO TABS: 6.2500 mg | ORAL_TABLET | Freq: Two times a day (BID) | ORAL | 1 refills | Status: DC

## 2022-04-18 NOTE — Progress Notes (Unsigned)
Primary Physician/Referring:  [name], MD  Patient ID: Alicia Cook, female    DOB: May 04, 1966, 56 y.o.   MRN: [id number]  Chief Complaint  Patient presents with   Cardiomyopathy   Follow-up     weeks      HPI:    Alicia Cook  is a 56 y.o. 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 Cardiac MRI performed in 2011 and cardiac PET scan in August 2017 does not appear to be consistent with cardiac sarcoidosis.  She had repeat PET scan performed at [facility] on 03/05/2020 again confirming this.  She also has uncontrolled diabetes, morbid obesity, mild sleep apnea  but not tolerating CPAP, systemic  hypertension.  Patient underwent laparoscopic gastric sleeve on 12/20/2020 and so far has lost about 60 pounds in weight.  Heart failure medications have previously been stopped during hospital admission and 06/2021 due to hypotension.  Patient has undergone PET scan to evaluate for underlying cardiac sarcoidosis, no evidence of this.  Patient presents today after ED visit on 03/14/2019, at which time she exhibited evidence of acute on chronic heart failure with pulmonary edema and weight trending up.  Patient is currently taking torsemide 20 mg p.o. twice daily.  She continues to experience orthopnea and her weight has continued to trend up an additional 3 pounds since ED evaluation.  She also reports coughing and wheezing at night.  She is currently taking midodrine 10 mg 2-3 times per day.  Past Medical History:  Diagnosis Date   Cataract    CHF (congestive heart failure) ([place])    Depression    Diabetes mellitus without complication ([place])    Encounter for adjustment of biventricular implantable cardioverter-defibrillator (ICD) 06/13/2019   Hypertension    ICD: Cardiac defibrillator in situ 01/13/2015    [employer] Bi-V ICD (Dynagen X4 CRT-D) 01/13/2015 at [facility] - MRI Compatible  Scheduled Remote ICD check 4.22.20: 1 SVT/8 secs, normal function. Battery longevity is 8 years. RA pacing is 1 %, RV pacing is 99 %, and LV pacing is 99 %.   Nonischemic cardiomyopathy ([place])    a. EF 35-40% by cath in 2011 with normal cors b. s/p ICD implantation in 12/2014 c. EF 25% by NST in 02/01/2016   Sarcoidosis    Sarcoidosis 02/08/2016   Past Surgical History:  Procedure Laterality Date   ABDOMINAL SURGERY  12/20/2020    laparoscopic sleeve gastrectomy   CARDIAC CATHETERIZATION N/A 02/08/2016   Procedure: Left Heart Cath and Coronary Angiography;  Surgeon: Jettie Booze, MD;  Location: [place] [facility];  Service: Cardiovascular;  Laterality: N/A;   CATARACT EXTRACTION Right 12/04/2018   CESAREAN SECTION     CHOLECYSTECTOMY     INSERTION OF ICD     a. s/p dual-chamber [employer] ICD 12/2014   LAPAROSCOPIC GASTRIC SLEEVE RESECTION     PACEMAKER INSERTION     RIGHT/LEFT HEART CATH AND CORONARY ANGIOGRAPHY N/A 07/12/2021   Procedure: RIGHT/LEFT HEART CATH AND CORONARY ANGIOGRAPHY;  Surgeon: Nigel Mormon, MD;  Location: [place] [facility];  Service: Cardiovascular;  Laterality: N/A;   TUBAL LIGATION     Family History  Problem Relation Age of Onset   Heart attack Father        a. Initial onset in his 47's. S/p CABG   Heart failure Father  Social History   Tobacco Use   Smoking status: Former    Packs/day: 1.00    Years: 20.00    Total pack years: 20.00    Types: Cigarettes    Quit date: 2016    Years since quitting: 7.4   Smokeless tobacco: Never  Substance Use Topics   Alcohol use: No    Alcohol/week: 0.0 standard drinks of alcohol   Marital Status: Single  ROS  Review of Systems  Constitutional: Positive for weight gain. Negative for malaise/fatigue and weight loss (intentional).  Cardiovascular:  Positive for dyspnea on exertion, orthopnea and  paroxysmal nocturnal dyspnea. Negative for chest pain, claudication, leg swelling, near-syncope, palpitations and syncope.  Respiratory:  Negative for wheezing.   Neurological:  Negative for dizziness.   Objective  Blood pressure 110/74, pulse 96, temperature 98.2 F (36.8 C), temperature source Temporal, resp. rate 16, height 5' 7"  (1.702 m), weight 211 lb (95.7 kg), last menstrual period 09/14/1991, SpO2 99 %.     04/18/2022    2:54 PM 04/03/2022   11:38 AM 03/12/2022    8:30 AM  Vitals with BMI  Height 5' 7"  5' 7"    Weight 211 lbs 212 lbs 3 oz   BMI 97.98 92.11   Systolic 941 95 740  Diastolic 74 64 81  Pulse 96 98 84   Orthostatic VS for the past 72 hrs (Last 3 readings):  Orthostatic BP Patient Position BP Location Cuff Size Orthostatic Pulse  04/18/22 1528 115/79 Standing Left Arm Large 101  04/18/22 1527 106/74 Sitting Left Arm Large 95  04/18/22 1525 112/77 Supine Left Arm Large 91     Physical Exam Vitals reviewed.  Constitutional:      General: She is not in acute distress.    Appearance: She is well-developed. She is obese.     Comments: Morbidly obese in no acute distress.  Neck:     Thyroid: No thyromegaly.     Vascular: No carotid bruit or JVD.  Cardiovascular:     Rate and Rhythm: Normal rate and regular rhythm.     Pulses: Intact distal pulses.          Carotid pulses are 2+ on the right side and 2+ on the left side.      Dorsalis pedis pulses are 2+ on the right side and 2+ on the left side.       Posterior tibial pulses are 2+ on the right side and 2+ on the left side.     Heart sounds: Normal heart sounds, S1 normal and S2 normal. No murmur heard.    No gallop.  Pulmonary:     Effort: Pulmonary effort is normal. No accessory muscle usage.     Breath sounds: Normal breath sounds. No wheezing or rales.  Musculoskeletal:     Right lower leg: No edema.     Left lower leg: No edema.    Laboratory examination:      Latest Ref Rng & Units 03/12/2022     5:27 AM 10/05/2021    7:48 AM 07/19/2021    3:56 AM  CMP  Glucose 70 - 99 mg/dL 96  101  84   BUN 6 - 20 mg/dL 17  13  9    Creatinine 0.44 - 1.00 mg/dL 0.80  0.73  0.75   Sodium 135 - 145 mmol/L 141  140  141   Potassium 3.5 - 5.1 mmol/L 3.5  3.4  4.5   Chloride 98 - 111 mmol/L 108  106  106   CO2 22 - 32 mmol/L 28  26  27    Calcium 8.9 - 10.3 mg/dL 8.6  9.2  9.1   Total Protein 6.5 - 8.1 g/dL 6.6     Total Bilirubin 0.3 - 1.2 mg/dL 0.6     Alkaline Phos 38 - 126 U/L 105     AST 15 - 41 U/L 45     ALT 0 - 44 U/L 47         Latest Ref Rng & Units 03/12/2022    5:27 AM 10/05/2021    7:48 AM 07/19/2021    3:56 AM  CBC  WBC 4.0 - 10.5 K/uL 7.1  11.4  8.2   Hemoglobin 12.0 - 15.0 g/dL 12.6  13.8  12.3   Hematocrit 36.0 - 46.0 % 38.3  42.0  37.6   Platelets 150 - 400 K/uL 192  169  237    Lipid Panel     Component Value Date/Time   CHOL 165 02/09/2016 0700   TRIG 94 02/09/2016 0700   HDL 64 02/09/2016 0700   CHOLHDL 2.6 02/09/2016 0700   VLDL 19 02/09/2016 0700   LDLCALC 82 02/09/2016 0700   HEMOGLOBIN A1C Lab Results  Component Value Date   HGBA1C 6.0 (H) 07/17/2021   MPG 125.5 07/17/2021   TSH No results for input(s): "TSH" in the last 8760 hours.  BNP    Component Value Date/Time   BNP 604.1 (H) 03/12/2022 0527    ProBNP    Component Value Date/Time   PROBNP 1,771 (H) 12/07/2021 1632    External labs:  06/14/2021: Sodium 140, potassium 3.4, glucose 79, BUN 18, creatinine 1.04, GFR 64 BNP 233 TSH 0.48  12/22/2020: Hb 12.1/HCT 36.8, platelets 227. Sodium 136, potassium 3.8, BUN 7, creatinine 0.69, EGFR >90 mL.  Serum glucose 106 mg.  08/31/2020: A1c 7.6%  07/20/2020: Glucose 101, sodium 139, potassium 4.2, BUN 12, creatinine 0.75, EGFR greater than 90 Hemoglobin 13.0, hematocrit 39.8, platelets 250  03/04/2020: BUN 14, creatinine 0.8, serum glucose 140 mg, EGFR >60 mL.  Magnesium 1.6. Total cholesterol 124, triglycerides 129, HDL 55, LDL 63.  Non-HDL  cholesterol 69. Hb 13.1/HCT 39.3, platelets 207.  Allergies  No Known Allergies   Medications Prior to Visit:   Outpatient Medications Prior to Visit  Medication Sig Dispense Refill   BIOTIN PO Take 1 capsule by mouth daily with lunch.     busPIRone (BUSPAR) 15 MG tablet Take 15 mg by mouth 2 (two) times daily.     CALCIUM PO Take 1 tablet by mouth 2 (two) times daily. chewable     digoxin (LANOXIN) 0.125 MG tablet Take 1 tablet (0.125 mg total) by mouth daily. 90 tablet 3   Ferrous Sulfate (IRON PO) Take 1 tablet by mouth daily.     folic acid (FOLVITE) 1 MG tablet Take 3 mg by mouth daily with lunch.     hydrOXYzine (VISTARIL) 25 MG capsule Take 25 mg by mouth 2 (two) times daily as needed for anxiety.     magnesium hydroxide (MILK OF MAGNESIA) 400 MG/5ML suspension Take 30 mLs by mouth daily as needed for mild constipation.     meloxicam (MOBIC) 7.5 MG tablet Take 1 tablet (7.5 mg total) by mouth daily. 10 tablet 0   methocarbamol (ROBAXIN) 500 MG tablet Take 1 tablet (500 mg total) by mouth 2 (two) times daily. 20 tablet 0   methotrexate (RHEUMATREX) 2.5 MG tablet Take 20 mg by mouth  every Sunday. Caution:Chemotherapy. Protect from light.     midodrine (PROAMATINE) 10 MG tablet TAKE 1 TABLET (10 MG TOTAL) BY MOUTH WITH BREAKFAST, WITH LUNCH, AND WITH EVENING MEAL. 90 tablet 0   Multiple Vitamin (MULTIVITAMIN WITH MINERALS) TABS tablet Take 1 tablet by mouth daily with lunch.     pantoprazole (PROTONIX) 40 MG tablet Take 40 mg by mouth daily as needed (acid reflux).     Potassium Chloride ER 20 MEQ TBCR Take 20 mEq by mouth daily as needed. Take when you take demadex     Semaglutide, 2 MG/DOSE, (OZEMPIC, 2 MG/DOSE,) 8 MG/3ML SOPN Inject 2 mg into the skin every Sunday.     Sertraline HCl 200 MG CAPS Take 200 mg by mouth daily with lunch.     spironolactone (ALDACTONE) 25 MG tablet Take 1 tablet (25 mg total) by mouth daily. 90 tablet 3   torsemide (DEMADEX) 20 MG tablet Take 1 tablet  (20 mg total) by mouth daily as needed. (Patient taking differently: Take 10 mg by mouth daily.)     vitamin B-12 (CYANOCOBALAMIN) 500 MCG tablet Take 500 mcg by mouth daily with lunch.     Vitamin D, Ergocalciferol, (DRISDOL) 1.25 MG (50000 UT) CAPS capsule Take 50,000 Units by mouth every Tuesday.     ivabradine (CORLANOR) 7.5 MG TABS tablet Take 1 tablet (7.5 mg total) by mouth 2 (two) times daily with a meal. 60 tablet 3   No facility-administered medications prior to visit.   Final Medications at End of Visit    Current Meds  Medication Sig   BIOTIN PO Take 1 capsule by mouth daily with lunch.   busPIRone (BUSPAR) 15 MG tablet Take 15 mg by mouth 2 (two) times daily.   CALCIUM PO Take 1 tablet by mouth 2 (two) times daily. chewable   digoxin (LANOXIN) 0.125 MG tablet Take 1 tablet (0.125 mg total) by mouth daily.   Ferrous Sulfate (IRON PO) Take 1 tablet by mouth daily.   folic acid (FOLVITE) 1 MG tablet Take 3 mg by mouth daily with lunch.   hydrOXYzine (VISTARIL) 25 MG capsule Take 25 mg by mouth 2 (two) times daily as needed for anxiety.   magnesium hydroxide (MILK OF MAGNESIA) 400 MG/5ML suspension Take 30 mLs by mouth daily as needed for mild constipation.   meloxicam (MOBIC) 7.5 MG tablet Take 1 tablet (7.5 mg total) by mouth daily.   methocarbamol (ROBAXIN) 500 MG tablet Take 1 tablet (500 mg total) by mouth 2 (two) times daily.   methotrexate (RHEUMATREX) 2.5 MG tablet Take 20 mg by mouth every Sunday. Caution:Chemotherapy. Protect from light.   midodrine (PROAMATINE) 10 MG tablet TAKE 1 TABLET (10 MG TOTAL) BY MOUTH WITH BREAKFAST, WITH LUNCH, AND WITH EVENING MEAL.   Multiple Vitamin (MULTIVITAMIN WITH MINERALS) TABS tablet Take 1 tablet by mouth daily with lunch.   pantoprazole (PROTONIX) 40 MG tablet Take 40 mg by mouth daily as needed (acid reflux).   Potassium Chloride ER 20 MEQ TBCR Take 20 mEq by mouth daily as needed. Take when you take demadex   Semaglutide, 2  MG/DOSE, (OZEMPIC, 2 MG/DOSE,) 8 MG/3ML SOPN Inject 2 mg into the skin every Sunday.   Sertraline HCl 200 MG CAPS Take 200 mg by mouth daily with lunch.   spironolactone (ALDACTONE) 25 MG tablet Take 1 tablet (25 mg total) by mouth daily.   torsemide (DEMADEX) 20 MG tablet Take 1 tablet (20 mg total) by mouth daily as needed. (Patient taking  differently: Take 10 mg by mouth daily.)   vitamin B-12 (CYANOCOBALAMIN) 500 MCG tablet Take 500 mcg by mouth daily with lunch.   Vitamin D, Ergocalciferol, (DRISDOL) 1.25 MG (50000 UT) CAPS capsule Take 50,000 Units by mouth every Tuesday.   Radiology:  CTA chest 03/12/2022: 1. No evidence for acute pulmonary embolus. 2. Cardiac enlargement, trace bilateral pleural effusions and mild pulmonary edema compatible with CHF.  DG Chest 2 View 04/19/2021 Stable cardiomegaly. Increased density posterior to the heart border at the lung base may represent atrial enlargement or increasing pericardial effusion.   Chest x-ray 06/08/2021: 1. Interval increase in size of the cardiac silhouette which may  reflect increased cardiomegaly or increased size of the known  pericardial effusion. Consider further evaluation with cardiac echo.  2. Tiny bilateral pleural effusions with bibasilar predominant  interstitial and airspace opacities likely reflecting pulmonary  edema.   Cardiac Studies:   Nuclear stress test 02/01/2016: No significant ST segment changes or arrhythmias were noted during stress  Negative ETT for ischemia at workload achieved  No significant chest pain symptoms reported  No significant arrhythmia noted  nuclear images report to follow   Right heart catheterization and myocardial biopsy 07/17/2016: Mild pulmonary hypertension, mildly elevated pulmonary capillary wedge pressure, biopsy negative for sarcoid.  Cardiac PET sarcoidosis with nuclear medicine perfusion 03/05/2020: A myocardial SPECT scan was performed at rest and contains a large,  severe perfusion defect involving the anterior, lateral and anterior lateral walls of the left ventricle, necessitating a FDG-PET viability study.  Physiologic distribution of radiotracer. No substantial accumulation of radiotracer in the area of perfusion defect. Other PET findings: Few FDG avid bilateral axillary lymph nodes, index right axillary node measuring 1 cm (SUV max 3.5). Additionally, there are a few hypermetabolic superior mediastinal lymph nodes. Index right upper paratracheal lymph node shows SUV max 2.2. CT: Few small nodules are noted in right upper lobe, similar to 12/02/2019 CT exam. Subsegmental atelectasis/scarring in left lower lobe. Left chest wall cardiac rhythm maintenance device with distal leads terminating in right atrial appendage and right ventricle and coronary sinus. Small fat-containing of umbilical hernia.   Right and left heart catheterization coronary angiography 07/12/2021: Right heart pressures: LV EDP is normal. RA: 0 mmHg RV: 19/0 mmHg PA: 21/3 mmHg, mPAP 10 mmHg PCW: 4 mmHg CO: 5.7 L/min CI: 2.7 L/min/m2  Conclusion:  Left dominant circulation No coronary artery disease Well compensated nonischemic cardiomyopathy  PET cardiac sarcoidosis 09/30/2021: 1.  Moderate perfusion defect in the lateral wall of the heart.  2.  No definite evidence of myocardial uptake to suggest active myocardial sarcoidosis.  3.  Left ventricular ejection fraction is 18%. Please correlate with cardiac ultrasound  4.  Multilevel FDG avid mediastinal and bilateral hilar lymphadenopathy most compatible with sequela of sarcoidosis.  PCV ECHOCARDIOGRAM COMPLETE 01/25/2022 Moderately depressed LV systolic function with visual EF 30-35%. Left ventricle cavity is dilated. Mild left ventricular hypertrophy. Regional wall motion abnormalities difficult to assess accurately due to dilated LV cavity, global hypokinesis, and pacemaker.  Doppler evidence of grade II (pseudonormal) diastolic  dysfunction, elevated LAP. Left atrial cavity is mildly dilated. IAS bows from left to right suggestive of elevated LAP. Moderate (Grade III) mitral regurgitation, posteriorly directed, wall-impinging jet. Mild tricuspid regurgitation. No evidence of pulmonary hypertension. Small pericardial effusion, predominantly located posteriorly.  There is no hemodynamic significance. Compared to study 06/24/2021 LVEF improved form 25% to 30-35%, G1DD is now Grade 2, otherwise no significant change.   ICD  Remote BiV ICD transmission 03/14/2022: AP 0%, CRT 99%.  Longevity 4 years.  Lead impedance and thresholds within normal limits.  There were no significant arrhythmias, no therapy.  Brief atrial tachycardia episode, longest 5 minutes.  No physiologic data available through the device.  Normal ICD function.   EKG   04/03/2022: AV paced rhythm at rate of 95 bpm.  Biventricular pacing detected.  Wide QRS complex.  No further analysis.  Unchanged compared to previous.   03/12/2020: Underlying sinus rhythm normal sinus rhythm at the rate of 83 bpm, ventricularly paced rhythm.  No further analysis.  Biventricular pacemaker detected.    09/15/2019: V paced rhythm, no further analysis.   Assessment   No diagnosis found.   No orders of the defined types were placed in this encounter.   There are no discontinued medications.    Recommendations:   Alicia Cook  is a 56 y.o. 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 Cardiac MRI performed in 2011 and cardiac PET scan in August 2017 does not appear to be consistent with cardiac sarcoidosis.  She had repeat PET scan performed at [facility] on 03/05/2020 again confirming this.  She also has uncontrolled diabetes, morbid obesity, mild sleep apnea  but not tolerating CPAP, systemic   hypertension.  Patient underwent laparoscopic gastric sleeve on 12/20/2020 and so far has lost about 60 pounds in weight.  Heart failure medications have previously been stopped during hospital admission and 06/2021 due to hypotension.  Patient has undergone PET scan to evaluate for underlying cardiac sarcoidosis, no evidence of this.  Patient presents today for urgent visit at her request with symptoms concerning for acute exacerbation of heart failure.  She is hypotensive.  We will stop Jardiance and start spironolactone 25 mg p.o. daily.  We will also start Corlanor 7.5 mg p.o. twice daily for improved heart rate control.  Advised patient to continue other medications including torsemide 20 mg p.o. twice daily.  We will obtain repeat BNP and BMP.  We will continue to follow patient closely.  Reviewed recent ICD transmission with patient, details above.   Follow-up in 1 to 2 weeks, sooner if needed.  Patient was seen in collaboration with Dr. Einar Gip and he is in agreement with the plan.   Adrian Prows, PA-C 04/18/2022, 3:36 PM Office: [phone]

## 2022-05-11 ENCOUNTER — Ambulatory Visit: Payer: Medicaid Other | Admitting: Cardiology

## 2022-05-13 ENCOUNTER — Other Ambulatory Visit: Payer: Self-pay | Admitting: Cardiology

## 2022-05-13 DIAGNOSIS — I5022 Chronic systolic (congestive) heart failure: Secondary | ICD-10-CM

## 2022-05-15 ENCOUNTER — Encounter: Payer: Self-pay | Admitting: Cardiology

## 2022-05-15 ENCOUNTER — Ambulatory Visit: Payer: Medicaid Other | Admitting: Cardiology

## 2022-05-15 VITALS — BP 96/63 | HR 97 | Temp 97.8°F | Resp 16 | Ht 67.0 in | Wt 217.8 lb

## 2022-05-15 DIAGNOSIS — Z95810 Presence of automatic (implantable) cardiac defibrillator: Secondary | ICD-10-CM

## 2022-05-15 DIAGNOSIS — Z4502 Encounter for adjustment and management of automatic implantable cardiac defibrillator: Secondary | ICD-10-CM

## 2022-05-15 DIAGNOSIS — I5022 Chronic systolic (congestive) heart failure: Secondary | ICD-10-CM

## 2022-05-15 MED ORDER — MAGNESIUM OXIDE -MG SUPPLEMENT 400 (240 MG) MG PO TABS: 400.0000 mg | ORAL_TABLET | Freq: Every day | ORAL | 3 refills | Status: DC

## 2022-05-15 NOTE — Progress Notes (Signed)
Chief Complaint  Patient presents with   Congestive Heart Failure   ICD Check   Remote BiV ICD transmission 03/14/2022: AP 0%, CRT 99%.  Longevity 4 years.  Lead impedance and thresholds within normal limits.  There were no significant arrhythmias, no therapy.  Brief atrial tachycardia episode, longest 5 minutes.  No physiologic data available through the device.  Normal ICD function.  Alert 04/03/2022: 19 seconds of NSVT for 5 Sec  Scheduled  In office ICD 05/15/22  Single (S)/Dual (D)/BV (M) BV Presenting ASBP @ 90/min Pacer dependant: No. Underlying 2:1 AV Block. AP <1%, . BP 99.% AMS Episodes Yes, PMT = S. Tach and not SVT Apr 2023. Marland Kitchen  AT/AF burden <0%.  HVR 4 Sec NSVT on 04/03/2022.  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  Lead measurements: Stable Histogram: Low (L)/normal (N)/high (H)  Normal Patient activity NA.  Observations: Normal ICD function.  Changes: None    ICD-10-CM   1. Encounter for adjustment of biventricular implantable cardioverter-defibrillator (ICD)  Z45.02     2. ICD: [employer] Bi-V ICD (Dynagen X4 CRT-D) MRI compatible ICD in situ  Z95.810     3. Chronic HFrEF (heart failure with reduced ejection fraction) (HCC)  I50.22 MAGnesium-Oxide 400 (240 Mg) MG tablet      Meds ordered this encounter  Medications   MAGnesium-Oxide 400 (240 Mg) MG tablet    Sig: Take 1 tablet (400 mg total) by mouth daily.    Dispense:  90 tablet    Refill:  3   Leg cramps with diuretics.   OV in 1 month   Yates Decamp, MD, [facility] 05/15/2022, 4:35 PM Office: [phone] Fax: [phone] Pager: [phone]

## 2022-05-15 NOTE — Progress Notes (Deleted)
No chief complaint on file.  No diagnosis found.    Scheduled  In office pacemaker check 05/15/22  Single (S)/Dual (D)/BV: ***. Presenting ***. Pacemaker dependant:  ***. Underlying ***. AP ***%, VP ***%. BP ***%. AMS Episodes ***.  AT/AF burden ***% . Longest ***. Latest ***. HVR ***. Longest ***. Latest ***. Longevity *** Years. Magnet rate: >85%. Lead measurements: Stable. Thoracic impedance: ***. Histogram: Low (L)/normal (N)/high (H)  ***. Patient activity ***.   Observations: ***. Changes: ***.

## 2022-06-06 ENCOUNTER — Other Ambulatory Visit: Payer: Self-pay | Admitting: Cardiology

## 2022-06-06 DIAGNOSIS — I5022 Chronic systolic (congestive) heart failure: Secondary | ICD-10-CM

## 2022-06-12 ENCOUNTER — Ambulatory Visit: Payer: Medicaid Other | Admitting: Cardiology

## 2022-06-15 ENCOUNTER — Ambulatory Visit: Payer: Medicaid Other | Admitting: Cardiology

## 2022-07-09 ENCOUNTER — Other Ambulatory Visit: Payer: Self-pay | Admitting: Cardiology

## 2022-07-09 DIAGNOSIS — I5022 Chronic systolic (congestive) heart failure: Secondary | ICD-10-CM

## 2022-07-13 ENCOUNTER — Ambulatory Visit: Payer: Medicaid Other | Admitting: Cardiology

## 2022-07-13 ENCOUNTER — Encounter: Payer: Self-pay | Admitting: Cardiology

## 2022-07-13 VITALS — BP 104/67 | HR 100 | Temp 98.9°F | Resp 16 | Ht 67.0 in | Wt 211.8 lb

## 2022-07-13 DIAGNOSIS — I428 Other cardiomyopathies: Secondary | ICD-10-CM

## 2022-07-13 DIAGNOSIS — I5022 Chronic systolic (congestive) heart failure: Secondary | ICD-10-CM

## 2022-07-13 DIAGNOSIS — Z95810 Presence of automatic (implantable) cardiac defibrillator: Secondary | ICD-10-CM

## 2022-07-13 NOTE — Progress Notes (Signed)
Primary Physician/Referring:  [name], MD  Patient ID: Alicia Cook, female    DOB: 11-22-1965, 56 y.o.   MRN: [id number]  No chief complaint on file.  HPI:    Miki Labuda  is a 56 y.o. 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    Since gastric sleeve surgery on 12/20/2020, she had lost 60 pounds in weight but had started to gain weight back.  She has now been started on Wegovy and has continued to lose weight again.  Since gastric banding, she also has had difficulty in taking guideline directed medical therapy due to severe hypotension.  Presents for a 6-week office visit.  She has lost 6 pounds in weight since last office visit.  She has not had any further leg edema.  She is tolerating spironolactone and also carvedilol.  She has not been using midodrine recently.  Past Medical History:  Diagnosis Date   Cataract    CHF (congestive heart failure) ([place])    Depression    Diabetes mellitus without complication ([place])    Encounter for adjustment of biventricular implantable cardioverter-defibrillator (ICD) 06/13/2019   Hypertension    ICD: Cardiac defibrillator in situ 01/13/2015   [employer] Bi-V ICD (Dynagen X4 CRT-D) 01/13/2015 at [facility] - MRI Compatible  Scheduled Remote ICD check 4.22.20: 1 SVT/8 secs, normal function. Battery longevity is 8 years. RA pacing is 1 %, RV pacing is 99 %, and LV pacing is 99 %.   Nonischemic cardiomyopathy ([place])    a. EF 35-40% by cath in 2011 with normal cors b. s/p ICD implantation in 12/2014 c. EF 25% by NST in 02/01/2016   Sarcoidosis    Sarcoidosis 02/08/2016   Past Surgical History:   Procedure Laterality Date   ABDOMINAL SURGERY  12/20/2020    laparoscopic sleeve gastrectomy   CARDIAC CATHETERIZATION N/A 02/08/2016   Procedure: Left Heart Cath and Coronary Angiography;  Surgeon: Jettie Booze, MD;  Location: [place] [facility];  Service: Cardiovascular;  Laterality: N/A;   CATARACT EXTRACTION Right 12/04/2018   CESAREAN SECTION     CHOLECYSTECTOMY     INSERTION OF ICD     a. s/p dual-chamber [employer] ICD 12/2014   LAPAROSCOPIC GASTRIC SLEEVE RESECTION     PACEMAKER INSERTION     RIGHT/LEFT HEART CATH AND CORONARY ANGIOGRAPHY N/A 07/12/2021   Procedure: RIGHT/LEFT HEART CATH AND CORONARY ANGIOGRAPHY;  Surgeon: Nigel Mormon, MD;  Location: [place] [facility];  Service: Cardiovascular;  Laterality: N/A;   TUBAL LIGATION     Family History  Problem Relation Age of Onset   Heart attack Father        a. Initial onset in his 54's. S/p CABG   Heart failure Father     Social History   Tobacco Use   Smoking status: Former    Packs/day: 1.00    Years: 20.00    Total pack years: 20.00    Types: Cigarettes    Quit date: 2016    Years since quitting: 7.7   Smokeless tobacco: Never  Substance Use Topics   Alcohol use: No    Alcohol/week: 0.0 standard drinks of alcohol   Marital Status: Single  ROS  Review of  Systems  Cardiovascular:  Positive for dyspnea on exertion. Negative for chest pain and leg swelling.   Objective  Blood pressure 104/67, pulse 100, temperature 98.9 F (37.2 C), temperature source Temporal, resp. rate 16, height 5' 7" (1.702 m), weight 211 lb 12.8 oz (96.1 kg), last menstrual period 09/14/1991, SpO2 98 %.     07/13/2022    3:17 PM 05/15/2022    4:16 PM 04/18/2022    2:54 PM  Vitals with BMI  Height 5' 7" 5' 7" 5' 7"  Weight 211 lbs 13 oz 217 lbs 13 oz 211 lbs  BMI 33.16 81.4 48.18  Systolic 563 96 149  Diastolic 67 63 74  Pulse 702 97 96   Orthostatic VS for the past 72 hrs (Last 3 readings):  Patient Position  BP Location Cuff Size  07/13/22 1517 Sitting Right Arm Large     Physical Exam Constitutional:      Appearance: She is obese.  Neck:     Vascular: No JVD.  Cardiovascular:     Rate and Rhythm: Normal rate and regular rhythm.     Pulses: Intact distal pulses.     Heart sounds: Normal heart sounds. No murmur heard.    No gallop.  Pulmonary:     Effort: Pulmonary effort is normal.     Breath sounds: Normal breath sounds.  Abdominal:     General: Bowel sounds are normal.     Palpations: Abdomen is soft.  Musculoskeletal:     Right lower leg: No edema.     Left lower leg: No edema.    Laboratory examination:      Latest Ref Rng & Units 03/12/2022    5:27 AM 10/05/2021    7:48 AM 07/19/2021    3:56 AM  CMP  Glucose 70 - 99 mg/dL 96  101  84   BUN 6 - 20 mg/dL _0 Creatinine 0.44 - 1.00 mg/dL 0.80  0.73  0.75   Sodium 135 - 145 mmol/L 141  140  141   Potassium 3.5 - 5.1 mmol/L 3.5  3.4  4.5   Chloride 98 - 111 mmol/L 108  106  106   CO2 22 - 32 mmol/L _1 Calcium 8.9 - 10.3 mg/dL 8.6  9.2  9.1   Total Protein 6.5 - 8.1 g/dL 6.6     Total Bilirubin 0.3 - 1.2 mg/dL 0.6     Alkaline Phos 38 - 126 U/L 105     AST 15 - 41 U/L 45     ALT 0 - 44 U/L 47         Latest Ref Rng & Units 03/12/2022    5:27 AM 10/05/2021    7:48 AM 07/19/2021    3:56 AM  CBC  WBC 4.0 - 10.5 K/uL 7.1  11.4  8.2   Hemoglobin 12.0 - 15.0 g/dL 12.6  13.8  12.3   Hematocrit 36.0 - 46.0 % 38.3  42.0  37.6   Platelets 150 - 400 K/uL 192  169  237    BNP    Component Value Date/Time   BNP 604.1 (H) 03/12/2022 0527    ProBNP    Component Value Date/Time   PROBNP 1,771 (H) 12/07/2021 1632   External labs:  Labs 6/23:  Sodium 142, potassium 4.0, BUN 21, creatinine 0.88, EGFR 78 mL.  Magnesium 2.3.  Hb 13.7/HCT 42.7, platelets 187.  08/23/2021:  A1c 5.6%.  Stage minimally reduced from 0.4 to.  Vitamin D 75.  Total cholesterol 216, triglycerides 109, HDL 74, LDL 120.   Non-HDL cholesterol 142. Allergies  No Known Allergies   Final Medications at End of Visit    Current Outpatient Medications:    BIOTIN PO, Take 1 capsule by mouth daily with lunch., Disp: , Rfl:    busPIRone (BUSPAR) 15 MG tablet, Take 15 mg by mouth 2 (two) times daily., Disp: , Rfl:    CALCIUM PO, Take 1 tablet by mouth 2 (two) times daily. chewable, Disp: , Rfl:    carvedilol (COREG) 6.25 MG tablet, TAKE 1 TABLET BY MOUTH TWICE A DAY, Disp: 60 tablet, Rfl: 1   digoxin (LANOXIN) 0.125 MG tablet, Take 1 tablet (0.125 mg total) by mouth daily., Disp: 90 tablet, Rfl: 3   Ferrous Sulfate (IRON PO), Take 1 tablet by mouth daily., Disp: , Rfl:    folic acid (FOLVITE) 1 MG tablet, Take 3 mg by mouth daily with lunch., Disp: , Rfl:    hydrOXYzine (VISTARIL) 25 MG capsule, Take 25 mg by mouth 2 (two) times daily as needed for anxiety., Disp: , Rfl:    magnesium hydroxide (MILK OF MAGNESIA) 400 MG/5ML suspension, Take 30 mLs by mouth daily as needed for mild constipation., Disp: , Rfl:    MAGnesium-Oxide 400 (240 Mg) MG tablet, Take 1 tablet (400 mg total) by mouth daily., Disp: 90 tablet, Rfl: 3   meloxicam (MOBIC) 7.5 MG tablet, Take 1 tablet (7.5 mg total) by mouth daily., Disp: 10 tablet, Rfl: 0   methocarbamol (ROBAXIN) 500 MG tablet, Take 1 tablet (500 mg total) by mouth 2 (two) times daily., Disp: 20 tablet, Rfl: 0   methotrexate (RHEUMATREX) 2.5 MG tablet, Take 20 mg by mouth every Sunday. Caution:Chemotherapy. Protect from light., Disp: , Rfl:    midodrine (PROAMATINE) 10 MG tablet, Take 1 tablet (10 mg total) by mouth 3 (three) times daily with meals as needed., Disp: 90 tablet, Rfl: 0   Multiple Vitamin (MULTIVITAMIN WITH MINERALS) TABS tablet, Take 1 tablet by mouth daily with lunch., Disp: , Rfl:    pantoprazole (PROTONIX) 40 MG tablet, Take 40 mg by mouth daily as needed (acid reflux)., Disp: , Rfl:    Potassium Chloride ER 20 MEQ TBCR, Take 20 mEq by mouth daily as needed. Take when  you take demadex, Disp: , Rfl:    Sertraline HCl 200 MG CAPS, Take 200 mg by mouth daily with lunch., Disp: , Rfl:    spironolactone (ALDACTONE) 25 MG tablet, Take 1 tablet (25 mg total) by mouth daily., Disp: 90 tablet, Rfl: 3   tirzepatide (MOUNJARO) 7.5 MG/0.5ML Pen, Inject 7.5 mg into the skin once a week., Disp: , Rfl:    tirzepatide (MOUNJARO) 7.5 MG/0.5ML Pen, Inject 7.5 mg into the skin once a week., Disp: , Rfl:    torsemide (DEMADEX) 20 MG tablet, Take 1 tablet (20 mg total) by mouth daily as needed. (Patient taking differently: Take 10 mg by mouth daily.), Disp: , Rfl:    vitamin B-12 (CYANOCOBALAMIN) 500 MCG tablet, Take 500 mcg by mouth daily with lunch., Disp: , Rfl:    Vitamin D, Ergocalciferol, (DRISDOL) 1.25 MG (50000 UT) CAPS capsule, Take 50,000 Units by mouth every Tuesday., Disp: , Rfl:    Radiology:  CTA chest 03/12/2022: 1. No evidence for acute pulmonary embolus. 2. Cardiac enlargement, trace bilateral pleural effusions and mild pulmonary edema compatible with CHF.  DG Chest 2 View 04/19/2021 Stable cardiomegaly.  Increased density posterior to the heart border at the lung base may represent atrial enlargement or increasing pericardial effusion.   Chest x-ray 06/08/2021: 1. Interval increase in size of the cardiac silhouette which may  reflect increased cardiomegaly or increased size of the known  pericardial effusion. Consider further evaluation with cardiac echo.  2. Tiny bilateral pleural effusions with bibasilar predominant  interstitial and airspace opacities likely reflecting pulmonary  edema.   Cardiac Studies:   Nuclear stress test 02/01/2016: No significant ST segment changes or arrhythmias were noted during stress  Negative ETT for ischemia at workload achieved  No significant chest pain symptoms reported  No significant arrhythmia noted  nuclear images report to follow   Right heart catheterization and myocardial biopsy 07/17/2016: Mild pulmonary  hypertension, mildly elevated pulmonary capillary wedge pressure, biopsy negative for sarcoid.  Cardiac PET sarcoidosis with nuclear medicine perfusion 03/05/2020: A myocardial SPECT scan was performed at rest and contains a large, severe perfusion defect involving the anterior, lateral and anterior lateral walls of the left ventricle, necessitating a FDG-PET viability study.  Physiologic distribution of radiotracer. No substantial accumulation of radiotracer in the area of perfusion defect. Other PET findings: Few FDG avid bilateral axillary lymph nodes, index right axillary node measuring 1 cm (SUV max 3.5). Additionally, there are a few hypermetabolic superior mediastinal lymph nodes. Index right upper paratracheal lymph node shows SUV max 2.2. CT: Few small nodules are noted in right upper lobe, similar to 12/02/2019 CT exam. Subsegmental atelectasis/scarring in left lower lobe. Left chest wall cardiac rhythm maintenance device with distal leads terminating in right atrial appendage and right ventricle and coronary sinus. Small fat-containing of umbilical hernia.   Right and left heart catheterization coronary angiography 07/12/2021: Right heart pressures: LV EDP is normal. RA: 0 mmHg RV: 19/0 mmHg PA: 21/3 mmHg, mPAP 10 mmHg PCW: 4 mmHg CO: 5.7 L/min CI: 2.7 L/min/m2  Conclusion:  Left dominant circulation No coronary artery disease Well compensated nonischemic cardiomyopathy  PET cardiac sarcoidosis 09/30/2021: 1.  Moderate perfusion defect in the lateral wall of the heart.  2.  No definite evidence of myocardial uptake to suggest active myocardial sarcoidosis.  3.  Left ventricular ejection fraction is 18%. Please correlate with cardiac ultrasound  4.  Multilevel FDG avid mediastinal and bilateral hilar lymphadenopathy most compatible with sequela of sarcoidosis.  PCV ECHOCARDIOGRAM COMPLETE 01/25/2022 Moderately depressed LV systolic function with visual EF 30-35%. Left ventricle  cavity is dilated. Mild left ventricular hypertrophy. Regional wall motion abnormalities difficult to assess accurately due to dilated LV cavity, global hypokinesis, and pacemaker.  Doppler evidence of grade II (pseudonormal) diastolic dysfunction, elevated LAP. Left atrial cavity is mildly dilated. IAS bows from left to right suggestive of elevated LAP. Moderate (Grade III) mitral regurgitation, posteriorly directed, wall-impinging jet. Mild tricuspid regurgitation. No evidence of pulmonary hypertension. Small pericardial effusion, predominantly located posteriorly.  There is no hemodynamic significance. Compared to study 06/24/2021 LVEF improved form 25% to 30-35%, G1DD is now Grade 2, otherwise no significant change.   ICD   Remote BiV ICD transmission 06/13/2022: AP 0%, CRT 100%.  Lead impedance and thresholds are stable.  There were no mode switches, 1 brief NSVT episode for 19 seconds.  Normal BiV ICD function.   EKG   04/03/2022: AV paced rhythm at rate of 95 bpm.  Biventricular pacing detected.  Wide QRS complex.  No further analysis.  Unchanged compared to previous.   03/12/2020: Underlying sinus rhythm normal sinus rhythm at the rate of  83 bpm, ventricularly paced rhythm.  No further analysis.  Biventricular pacemaker detected.    09/15/2019: V paced rhythm, no further analysis.   Assessment     ICD-10-CM   1. Chronic HFrEF (heart failure with reduced ejection fraction) (HCC)  I50.22 PCV ECHOCARDIOGRAM COMPLETE    2. ICD: [employer] Bi-V ICD (Dynagen X4 CRT-D) MRI compatible ICD in situ  Z95.810     3. Nonischemic cardiomyopathy (HCC)  I42.8      No orders of the defined types were placed in this encounter.   Medications Discontinued During This Encounter  Medication Reason   Semaglutide, 2 MG/DOSE, (OZEMPIC, 2 MG/DOSE,) 8 MG/3ML SOPN    ivabradine (CORLANOR) 7.5 MG TABS tablet Ineffective   Recommendations:   Jory Welke  is a 56 y.o. 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    Since gastric sleeve surgery on 12/20/2020, she had lost 60 pounds in weight but had started to gain weight back.  She has now been started on Wegovy and has continued to lose weight again.  Since gastric banding, she also has had difficulty in taking guideline directed medical therapy due to hypotension.  Gradually this is improved, she is not orthostatic anymore and has been using midodrine only on a as needed basis.  She is presently only on carvedilol 6.25 mg twice daily, spironolactone 25 mg daily, torsemide 20 mg along with potassium supplements as needed and digoxin.  Continues to have elevated heart rate >70 in spite of being on maximum dose of Corlanor hence felt to be ineffective which I will discontinue today.  She does not have any clinical evidence of heart failure. Continue present management.  ICD is functioning normally.  She is now taking Wegovy for weight loss, appears to be effective, advised her to continue to keep herself gently hydrated and continue to lose weight.  I would like to see her back in 6 months.  As it has been 6 months since last echocardiogram, will also establish LVEF again now.    Adrian Prows, PA-C 07/13/2022, 4:09 PM Office: [phone]

## 2022-07-28 ENCOUNTER — Ambulatory Visit: Payer: Medicaid Other

## 2022-07-28 VITALS — BP 94/69 | HR 102 | Temp 97.5°F | Resp 16 | Ht 67.0 in | Wt 211.2 lb

## 2022-07-28 DIAGNOSIS — I428 Other cardiomyopathies: Secondary | ICD-10-CM

## 2022-07-28 DIAGNOSIS — I5022 Chronic systolic (congestive) heart failure: Secondary | ICD-10-CM

## 2022-07-28 DIAGNOSIS — R0602 Shortness of breath: Secondary | ICD-10-CM

## 2022-07-28 MED ORDER — DAPAGLIFLOZIN PROPANEDIOL 10 MG PO TABS: 10.0000 mg | ORAL_TABLET | Freq: Every day | ORAL | 2 refills | Status: DC

## 2022-07-28 MED ORDER — VALSARTAN 40 MG PO TABS: 40.0000 mg | ORAL_TABLET | Freq: Every evening | ORAL | 1 refills | Status: DC

## 2022-07-28 NOTE — Progress Notes (Signed)
Primary Physician/Referring:  [name], MD  Patient ID: Alicia Cook, female    DOB: 1966/10/18, 56 y.o.   MRN: [id number]  Chief Complaint  Patient presents with   Shortness of Breath   Follow-up   HPI:    Alicia Cook  is a 56 y.o. 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  Presents for office visit for shortness of breath and wheezing. She feels that it is difficult for her to speak in long sentences or walk short distances. She has been taking Torsemide 27m daily for the past week and completing breathing treatments without minimal improvement in symptoms. She denies chest pain or lower leg edema.  Past Medical History:  Diagnosis Date   Cataract    CHF (congestive heart failure) ([facility]    Depression    Diabetes mellitus without complication ([place]    Encounter for adjustment of biventricular implantable cardioverter-defibrillator (ICD) 06/13/2019   Hypertension    ICD: Cardiac defibrillator in situ 01/13/2015   [employer] Bi-V ICD (Dynagen X4 CRT-D) [facility]  Scheduled Remote ICD check 4.22.20: 1 SVT/8 secs, normal function. Battery longevity is 8 years. RA pacing is 1 %, RV pacing is 99 %, and LV pacing is 99 %.   Nonischemic cardiomyopathy ([facility]    a. EF 35-40% by cath in 2011 with normal cors b. s/p ICD implantation in 12/2014 c. EF 25% by NST in 02/01/2016   Sarcoidosis    Sarcoidosis 02/08/2016   Past Surgical History:  Procedure Laterality Date   ABDOMINAL SURGERY  12/20/2020    laparoscopic sleeve gastrectomy   CARDIAC CATHETERIZATION N/A 02/08/2016   Procedure: Left Heart Cath and  Coronary Angiography;  Surgeon: JJettie Booze MD;  [facility];  Service: Cardiovascular;  Laterality: N/A;   CATARACT EXTRACTION Right 12/04/2018   CESAREAN SECTION     CHOLECYSTECTOMY     INSERTION OF ICD     a. s/p dual-chamber [employer] ICD 12/2014   LAPAROSCOPIC GASTRIC SLEEVE RESECTION     PACEMAKER INSERTION     RIGHT/LEFT HEART CATH AND CORONARY ANGIOGRAPHY N/A 07/12/2021   Procedure: RIGHT/LEFT HEART CATH AND CORONARY ANGIOGRAPHY;  Surgeon: PNigel Mormon MD;  [facility];  Service: Cardiovascular;  Laterality: N/A;   TUBAL LIGATION     Family History  Problem Relation Age of Onset   Heart attack Father        a. Initial onset in his 518's S/p CABG   Heart failure Father     Social History   Tobacco Use   Smoking status: Former    Packs/day: 1.00    Years: 20.00    Total pack years: 20.00    Types: Cigarettes    Quit date: 2016    Years since quitting: 7.7   Smokeless tobacco: Never  Substance Use Topics   Alcohol use: No    Alcohol/week: 0.0 standard drinks of alcohol   Marital Status: Single  ROS  Review of Systems  Cardiovascular:  Positive for dyspnea on exertion. Negative for chest pain, leg swelling and syncope.  Respiratory:  Positive for shortness of breath.    Objective  Blood pressure 94/69, pulse (Marland Kitchen  102, temperature (!) 97.5 F (36.4 C), temperature source Temporal, resp. rate 16, height _0  (1.702 m), weight 211 lb 3.2 oz (95.8 kg), last menstrual period 09/14/1991, SpO2 100 %.     07/28/2022    2:10 PM 07/13/2022    3:17 PM 05/15/2022    4:16 PM  Vitals with BMI  Height _1  _2  _3   Weight 211 lbs 3 oz 211 lbs 13 oz 217 lbs 13 oz  BMI 33.07 05.11 02.1  Systolic 94 117 96  Diastolic 69 67 63  Pulse 356 100 97   Orthostatic VS for the past 72 hrs (Last 3 readings):  Patient Position BP Location Cuff Size  07/28/22 1410 Sitting Left Arm Large    Physical Exam Constitutional:       Appearance: She is obese.  Neck:     Vascular: No JVD.  Cardiovascular:     Rate and Rhythm: Normal rate and regular rhythm.     Pulses: Intact distal pulses.     Heart sounds: Heart sounds are distant. No murmur heard.    No gallop.  Pulmonary:     Effort: Pulmonary effort is normal.     Breath sounds: Normal breath sounds. No decreased breath sounds, wheezing or rales.  Abdominal:     General: Bowel sounds are normal.     Palpations: Abdomen is soft.  Musculoskeletal:     Right lower leg: No edema.     Left lower leg: No edema.  Neurological:     Mental Status: She is alert.    Laboratory examination:      Latest Ref Rng & Units 03/12/2022    5:27 AM 10/05/2021    7:48 AM 07/19/2021    3:56 AM  CMP  Glucose 70 - 99 mg/dL 96  101  84   BUN 6 - 20 mg/dL _4 Creatinine 0.44 - 1.00 mg/dL 0.80  0.73  0.75   Sodium 135 - 145 mmol/L 141  140  141   Potassium 3.5 - 5.1 mmol/L 3.5  3.4  4.5   Chloride 98 - 111 mmol/L 108  106  106   CO2 22 - 32 mmol/L _5 Calcium 8.9 - 10.3 mg/dL 8.6  9.2  9.1   Total Protein 6.5 - 8.1 g/dL 6.6     Total Bilirubin 0.3 - 1.2 mg/dL 0.6     Alkaline Phos 38 - 126 U/L 105     AST 15 - 41 U/L 45     ALT 0 - 44 U/L 47         Latest Ref Rng & Units 03/12/2022    5:27 AM 10/05/2021    7:48 AM 07/19/2021    3:56 AM  CBC  WBC 4.0 - 10.5 K/uL 7.1  11.4  8.2   Hemoglobin 12.0 - 15.0 g/dL 12.6  13.8  12.3   Hematocrit 36.0 - 46.0 % 38.3  42.0  37.6   Platelets 150 - 400 K/uL 192  169  237    BNP    Component Value Date/Time   BNP 604.1 (H) 03/12/2022 0527    ProBNP    Component Value Date/Time   PROBNP 1,771 (H) 12/07/2021 1632   External labs:  Labs 6/23:  Sodium 142, potassium 4.0, BUN 21, creatinine 0.88, EGFR 78 mL.  Magnesium 2.3.  Hb 13.7/HCT 42.7, platelets 187.  08/23/2021:  A1c 5.6%.  Stage minimally reduced  from 0.4 to.  Vitamin D 75.  Total cholesterol 216, triglycerides 109, HDL 74, LDL 120.  Non-HDL  cholesterol 142. Allergies  No Known Allergies   Final Medications at End of Visit    Current Outpatient Medications:    BIOTIN PO, Take 1 capsule by mouth daily with lunch., Disp: , Rfl:    busPIRone (BUSPAR) 15 MG tablet, Take 15 mg by mouth 2 (two) times daily., Disp: , Rfl:    CALCIUM PO, Take 1 tablet by mouth 2 (two) times daily. chewable, Disp: , Rfl:    carvedilol (COREG) 6.25 MG tablet, TAKE 1 TABLET BY MOUTH TWICE A DAY, Disp: 60 tablet, Rfl: 1   digoxin (LANOXIN) 0.125 MG tablet, Take 1 tablet (0.125 mg total) by mouth daily., Disp: 90 tablet, Rfl: 3   Ferrous Sulfate (IRON PO), Take 1 tablet by mouth daily., Disp: , Rfl:    folic acid (FOLVITE) 1 MG tablet, Take 3 mg by mouth daily with lunch., Disp: , Rfl:    hydrOXYzine (VISTARIL) 25 MG capsule, Take 25 mg by mouth 2 (two) times daily as needed for anxiety., Disp: , Rfl:    magnesium hydroxide (MILK OF MAGNESIA) 400 MG/5ML suspension, Take 30 mLs by mouth daily as needed for mild constipation., Disp: , Rfl:    MAGnesium-Oxide 400 (240 Mg) MG tablet, Take 1 tablet (400 mg total) by mouth daily., Disp: 90 tablet, Rfl: 3   meloxicam (MOBIC) 7.5 MG tablet, Take 1 tablet (7.5 mg total) by mouth daily., Disp: 10 tablet, Rfl: 0   methocarbamol (ROBAXIN) 500 MG tablet, Take 1 tablet (500 mg total) by mouth 2 (two) times daily., Disp: 20 tablet, Rfl: 0   methotrexate (RHEUMATREX) 2.5 MG tablet, Take 20 mg by mouth every Sunday. Caution:Chemotherapy. Protect from light., Disp: , Rfl:    midodrine (PROAMATINE) 10 MG tablet, Take 1 tablet (10 mg total) by mouth 3 (three) times daily with meals as needed., Disp: 90 tablet, Rfl: 0   Multiple Vitamin (MULTIVITAMIN WITH MINERALS) TABS tablet, Take 1 tablet by mouth daily with lunch., Disp: , Rfl:    pantoprazole (PROTONIX) 40 MG tablet, Take 40 mg by mouth daily as needed (acid reflux)., Disp: , Rfl:    Potassium Chloride ER 20 MEQ TBCR, Take 20 mEq by mouth daily as needed. Take when you take  demadex, Disp: , Rfl:    Sertraline HCl 200 MG CAPS, Take 200 mg by mouth daily with lunch., Disp: , Rfl:    spironolactone (ALDACTONE) 25 MG tablet, Take 1 tablet (25 mg total) by mouth daily., Disp: 90 tablet, Rfl: 3   [START ON 08/01/2022] tirzepatide (MOUNJARO) 10 MG/0.5ML Pen, Inject 10 mg into the skin once a week., Disp: , Rfl:    vitamin B-12 (CYANOCOBALAMIN) 500 MCG tablet, Take 500 mcg by mouth daily with lunch., Disp: , Rfl:    Vitamin D, Ergocalciferol, (DRISDOL) 1.25 MG (50000 UT) CAPS capsule, Take 50,000 Units by mouth every Tuesday., Disp: , Rfl:    torsemide (DEMADEX) 20 MG tablet, Take 1 tablet (20 mg total) by mouth daily as needed. (Patient taking differently: Take 10 mg by mouth daily.), Disp: , Rfl:    Radiology:  CTA chest 03/12/2022: 1. No evidence for acute pulmonary embolus. 2. Cardiac enlargement, trace bilateral pleural effusions and mild pulmonary edema compatible with CHF.  DG Chest 2 View 04/19/2021 Stable cardiomegaly. Increased density posterior to the heart border at the lung base may represent atrial enlargement or increasing pericardial effusion.  Chest x-ray 06/08/2021: 1. Interval increase in size of the cardiac silhouette which may  reflect increased cardiomegaly or increased size of the known  pericardial effusion. Consider further evaluation with cardiac echo.  2. Tiny bilateral pleural effusions with bibasilar predominant  interstitial and airspace opacities likely reflecting pulmonary  edema.   Cardiac Studies:   Right heart catheterization and myocardial biopsy 07/17/2016: Mild pulmonary hypertension, mildly elevated pulmonary capillary wedge pressure, biopsy negative for sarcoid.  Cardiac PET sarcoidosis with nuclear medicine perfusion 03/05/2020: A myocardial SPECT scan was performed at rest and contains a large, severe perfusion defect involving the anterior, lateral and anterior lateral walls of the left ventricle, necessitating a FDG-PET  viability study.  Physiologic distribution of radiotracer. No substantial accumulation of radiotracer in the area of perfusion defect. Other PET findings: Few FDG avid bilateral axillary lymph nodes, index right axillary node measuring 1 cm (SUV max 3.5). Additionally, there are a few hypermetabolic superior mediastinal lymph nodes. Index right upper paratracheal lymph node shows SUV max 2.2. CT: Few small nodules are noted in right upper lobe, similar to 12/02/2019 CT exam. Subsegmental atelectasis/scarring in left lower lobe. Left chest wall cardiac rhythm maintenance device with distal leads terminating in right atrial appendage and right ventricle and coronary sinus. Small fat-containing of umbilical hernia.   Right and left heart catheterization coronary angiography 07/12/2021: Right heart pressures: LV EDP is normal. RA: 0 mmHg RV: 19/0 mmHg PA: 21/3 mmHg, mPAP 10 mmHg PCW: 4 mmHg CO: 5.7 L/min CI: 2.7 L/min/m2  Conclusion:  Left dominant circulation No coronary artery disease Well compensated nonischemic cardiomyopathy  PET cardiac sarcoidosis 09/30/2021: 1.  Moderate perfusion defect in the lateral wall of the heart.  2.  No definite evidence of myocardial uptake to suggest active myocardial sarcoidosis.  3.  Left ventricular ejection fraction is 18%. Please correlate with cardiac ultrasound  4.  Multilevel FDG avid mediastinal and bilateral hilar lymphadenopathy most compatible with sequela of sarcoidosis.  PCV ECHOCARDIOGRAM COMPLETE 01/25/2022 Moderately depressed LV systolic function with visual EF 30-35%. Left ventricle cavity is dilated. Mild left ventricular hypertrophy. Regional wall motion abnormalities difficult to assess accurately due to dilated LV cavity, global hypokinesis, and pacemaker.  Doppler evidence of grade II (pseudonormal) diastolic dysfunction, elevated LAP. Left atrial cavity is mildly dilated. IAS bows from left to right suggestive of elevated LAP.Moderate  (Grade III) mitral regurgitation, posteriorly directed, wall-impinging jet. Mild tricuspid regurgitation. No evidence of pulmonary hypertension. Small pericardial effusion, predominantly located posteriorly.  There is no hemodynamic significance. Compared to study 06/24/2021 LVEF improved form 25% to 30-35%, G1DD is now Grade 2, otherwise no significant change.   ICD   Remote BiV ICD transmission 06/13/2022: AP 0%, CRT 100%.  Lead impedance and thresholds are stable.  There were no mode switches, 1 brief NSVT episode for 19 seconds.  Normal BiV ICD function.   EKG   EKG 07/28/22: AV paced rhythm with rate 107 bpm.  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  Wide QRS complex.  Compared to previous EKG no significant change.  04/03/2022: AV paced rhythm at rate of 95 bpm.  Biventricular pacing detected.  Wide QRS complex.  No further analysis.  Unchanged compared to previous.   Assessment     ICD-10-CM   1. SOB (shortness of breath)  R06.02 EKG 12-Lead    2. Chronic HFrEF (heart failure with reduced ejection fraction) (HCC)  I50.22     3. Nonischemic cardiomyopathy (HCC)  I42.8  No orders of the defined types were placed in this encounter.   Medications Discontinued During This Encounter  Medication Reason   tirzepatide (MOUNJARO) 7.5 MG/0.5ML Pen    Recommendations:   SOB (shortness of breath) Chronic HFrEF (heart failure with reduced ejection fraction) (HCC) Nonischemic cardiomyopathy ([place]) She is presently on carvedilol 6.25 mg twice daily, spironolactone 25 mg daily, digoxin 0.123m daily. Start Farxiga 166mdaily and valsartan 4034maily in evening for heart failure symptoms. Stop torsemide. If BP drops, ok to take midodrine and stop valsartan. She may also take half of the valsartan if blood pressure is low. Will reschedule echo for first available. Recent labs reviewed. Ordered P-BNP and BMP, obtain prior to next visit.  Follow-up in 2 weeks or sooner if  needed.    BriErnst SpellP 07/28/2022, 2:56 PM Office: [phone]

## 2022-07-31 ENCOUNTER — Other Ambulatory Visit: Payer: Self-pay

## 2022-07-31 ENCOUNTER — Emergency Department (HOSPITAL_BASED_OUTPATIENT_CLINIC_OR_DEPARTMENT_OTHER): Payer: Medicaid Other

## 2022-07-31 ENCOUNTER — Emergency Department (HOSPITAL_BASED_OUTPATIENT_CLINIC_OR_DEPARTMENT_OTHER)
Admission: EM | Admit: 2022-07-31 | Discharge: 2022-07-31 | Disposition: A | Discharge status: Home / Self Care | Payer: Medicaid Other | Attending: Emergency Medicine | Admitting: Emergency Medicine

## 2022-07-31 ENCOUNTER — Encounter (HOSPITAL_BASED_OUTPATIENT_CLINIC_OR_DEPARTMENT_OTHER): Payer: Self-pay | Admitting: Emergency Medicine

## 2022-07-31 DIAGNOSIS — E119 Type 2 diabetes mellitus without complications: Secondary | ICD-10-CM | POA: Diagnosis not present

## 2022-07-31 DIAGNOSIS — Z7984 Long term (current) use of oral hypoglycemic drugs: Secondary | ICD-10-CM | POA: Insufficient documentation

## 2022-07-31 DIAGNOSIS — R062 Wheezing: Secondary | ICD-10-CM | POA: Insufficient documentation

## 2022-07-31 DIAGNOSIS — Z79899 Other long term (current) drug therapy: Secondary | ICD-10-CM | POA: Insufficient documentation

## 2022-07-31 DIAGNOSIS — I110 Hypertensive heart disease with heart failure: Secondary | ICD-10-CM | POA: Diagnosis not present

## 2022-07-31 DIAGNOSIS — I509 Heart failure, unspecified: Secondary | ICD-10-CM | POA: Insufficient documentation

## 2022-07-31 DIAGNOSIS — R0602 Shortness of breath: Secondary | ICD-10-CM | POA: Diagnosis not present

## 2022-07-31 LAB — BASIC METABOLIC PANEL
Anion gap: 5 (ref 5–15)
BUN: 19 mg/dL (ref 6–20)
CO2: 25 mmol/L (ref 22–32)
Calcium: 8.9 mg/dL (ref 8.9–10.3)
Chloride: 110 mmol/L (ref 98–111)
Creatinine, Ser: 0.88 mg/dL (ref 0.44–1.00)
GFR, Estimated: >60 mL/min (ref >60)
Glucose, Bld: 99 mg/dL (ref 70–99)
Potassium: 3.8 mmol/L (ref 3.5–5.1)
Sodium: 140 mmol/L (ref 135–145)

## 2022-07-31 LAB — CBC WITH DIFFERENTIAL/PLATELET
Abs Immature Granulocytes: 0.01 10*3/uL (ref 0.00–0.07)
Basophils Absolute: 0 10*3/uL (ref 0.0–0.1)
Basophils Relative: 1 %
Eosinophils Absolute: 0.2 10*3/uL (ref 0.0–0.5)
Eosinophils Relative: 2 %
HCT: 36.6 % (ref 36.0–46.0)
Hemoglobin: 11.9 g/dL — ABNORMAL LOW (ref 12.0–15.0)
Immature Granulocytes: 0 %
Lymphocytes Relative: 22 %
Lymphs Abs: 1.4 10*3/uL (ref 0.7–4.0)
MCH: 29.1 pg (ref 26.0–34.0)
MCHC: 32.5 g/dL (ref 30.0–36.0)
MCV: 89.5 fL (ref 80.0–100.0)
Monocytes Absolute: 0.6 10*3/uL (ref 0.1–1.0)
Monocytes Relative: 9 %
Neutro Abs: 4.3 10*3/uL (ref 1.7–7.7)
Neutrophils Relative %: 66 %
Platelets: 199 10*3/uL (ref 150–400)
RBC: 4.09 MIL/uL (ref 3.87–5.11)
RDW: 13.9 % (ref 11.5–15.5)
WBC: 6.5 10*3/uL (ref 4.0–10.5)
nRBC: 0 % (ref 0.0–0.2)

## 2022-07-31 LAB — TROPONIN I (HIGH SENSITIVITY)
Troponin I (High Sensitivity): 18 ng/L — ABNORMAL HIGH (ref <18)
Troponin I (High Sensitivity): 17 ng/L (ref <18)

## 2022-07-31 LAB — BRAIN NATRIURETIC PEPTIDE: B Natriuretic Peptide: 494.3 pg/mL — ABNORMAL HIGH (ref 0.0–100.0)

## 2022-07-31 MED ORDER — MIDODRINE HCL 5 MG PO TABS: 10.0000 mg | ORAL_TABLET | Freq: Once | ORAL | Status: DC

## 2022-07-31 NOTE — ED Provider Notes (Signed)
[facility]  Provider Note  CSN: [id number] Arrival date & time: 07/31/22 0327  History Chief Complaint  Patient presents with   Shortness of Breath    Alicia Cook is a 56 y.o. female with complex PMH of DM, obesity, OSA, HTN, sarcoidosis (no cardiac involvement on prior MRI) and CHF (non-ischemic, normal coronaries on prior cath) with low EF, s/p AICD/Pacer managed by Dr. Einar Gip has had SOB for the last 2 weeks. She was previously taking torsemide as needed although she did not noticed that it helped much and it was causing her some low K. She was seen in the Cards office 2 days ago and had the torsemide held, added [place] as a diuretic and valsartan for HTN. She tends to run low BP and has also been given midodrine to take PRN but states she hasn't noticed if it helped. She has not had a cough. She feels tight and wheezy, but no history of asthma or COPD. She denies any leg swelling. She can walk about 10 steps before getting winded.    Home Medications Prior to Admission medications   Medication Sig Start Date End Date Taking? Authorizing Provider  BIOTIN PO Take 1 capsule by mouth daily with lunch.    [provider]  busPIRone (BUSPAR) 15 MG tablet Take 15 mg by mouth 2 (two) times daily. 03/24/22   [provider]  CALCIUM PO Take 1 tablet by mouth 2 (two) times daily. chewable    [provider]  carvedilol (COREG) 6.25 MG tablet TAKE 1 TABLET BY MOUTH TWICE A DAY 07/10/22   Adrian Prows, MD  dapagliflozin propanediol (FARXIGA) 10 MG TABS tablet Take 1 tablet (10 mg total) by mouth daily before breakfast. 07/28/22   Ernst Spell, NP  digoxin (LANOXIN) 0.125 MG tablet Take 1 tablet (0.125 mg total) by mouth daily. 07/28/21   Cantwell, Celeste C, PA-C  Ferrous Sulfate (IRON PO) Take 1 tablet by mouth daily.    [provider]  folic acid (FOLVITE) 1 MG tablet Take 3 mg by mouth daily with lunch.    [provider]  hydrOXYzine (VISTARIL) 25 MG capsule Take 25 mg by mouth 2 (two) times daily as needed for anxiety. 11/26/20   [provider]  magnesium hydroxide (MILK OF MAGNESIA) 400 MG/5ML suspension Take 30 mLs by mouth daily as needed for mild constipation.    [provider]  MAGnesium-Oxide 400 (240 Mg) MG tablet Take 1 tablet (400 mg total) by mouth daily. 05/15/22   Adrian Prows, MD  meloxicam (MOBIC) 7.5 MG tablet Take 1 tablet (7.5 mg total) by mouth daily. 02/25/22   Carlisle Cater, PA-C  methocarbamol (ROBAXIN) 500 MG tablet Take 1 tablet (500 mg total) by mouth 2 (two) times daily. 02/25/22   Carlisle Cater, PA-C  methotrexate (RHEUMATREX) 2.5 MG tablet Take 20 mg by mouth every Sunday. Caution:Chemotherapy. Protect from light.    [provider]  midodrine (PROAMATINE) 10 MG tablet Take 1 tablet (10 mg total) by mouth 3 (three) times daily with meals as needed. 04/18/22   Adrian Prows, MD  Multiple Vitamin (MULTIVITAMIN WITH MINERALS) TABS tablet Take 1 tablet by mouth daily with lunch.    [provider]  pantoprazole (PROTONIX) 40 MG tablet Take 40 mg by mouth daily as needed (acid reflux). 01/23/20   [provider]  Potassium Chloride ER 20 MEQ TBCR Take 20 mEq by mouth daily as needed. Take when you take demadex  07/19/21   Eugenie Filler, MD  Sertraline HCl 200 MG CAPS Take 200 mg by mouth daily with lunch.    [provider]  spironolactone (ALDACTONE) 25 MG tablet Take 1 tablet (25 mg total) by mouth daily. 04/03/22 03/29/23  Cantwell, Celeste C, PA-C  tirzepatide [facility]) 10 MG/0.5ML Pen Inject 10 mg into the skin once a week. 08/01/22   [provider]  valsartan (DIOVAN) 40 MG tablet Take 1 tablet (40 mg total) by mouth every evening. 07/28/22   Ernst Spell, NP  vitamin B-12 (CYANOCOBALAMIN) 500 MCG tablet Take 500 mcg by mouth daily with lunch.    [provider]  Vitamin D, Ergocalciferol, (DRISDOL)  1.25 MG (50000 UT) CAPS capsule Take 50,000 Units by mouth every Tuesday. 09/20/18   [provider]     Allergies    Patient has no known allergies.   Review of Systems   Review of Systems Please see HPI for pertinent positives and negatives  Physical Exam BP 90/70   Pulse 91   Temp 98 F (36.7 C)   Resp (!) 23   Ht 5\' 7"  (1.702 m)   Wt 97.3 kg   LMP 09/14/1991 (Within Weeks)   SpO2 99%   BMI 33.60 kg/m   Physical Exam Vitals and nursing note reviewed.  Constitutional:      Appearance: Normal appearance. She is obese.  HENT:     Head: Normocephalic and atraumatic.     Nose: Nose normal.     Mouth/Throat:     Mouth: Mucous membranes are moist.  Eyes:     Extraocular Movements: Extraocular movements intact.     Conjunctiva/sclera: Conjunctivae normal.  Cardiovascular:     Rate and Rhythm: Normal rate.  Pulmonary:     Effort: Pulmonary effort is normal.     Breath sounds: Normal breath sounds. No decreased breath sounds, wheezing or rales.  Abdominal:     General: Abdomen is flat.     Palpations: Abdomen is soft.     Tenderness: There is no abdominal tenderness.  Musculoskeletal:        General: No swelling. Normal range of motion.     Cervical back: Neck supple.     Right lower leg: No edema.     Left lower leg: No edema.  Skin:    General: Skin is warm and dry.  Neurological:     General: No focal deficit present.     Mental Status: She is alert.  Psychiatric:        Mood and Affect: Mood normal.     ED Results / Procedures / Treatments   EKG EKG Interpretation  Date/Time:  Monday July 31 2022 03:38:23 EDT Ventricular Rate:  92 PR Interval:  118 QRS Duration: 218 QT Interval:  474 QTC Calculation: 587 R Axis:   -77 Text Interpretation: Sinus rhythm Borderline short PR interval Right bundle branch block LVH with secondary repolarization abnormality No significant change since last tracing Confirmed by Calvert Cantor [phone]) on  07/31/2022 3:40:29 AM  Procedures Procedures  Medications Ordered in the ED Medications - No data to display  Initial Impression and Plan  Patient here with persistent SOB, multiple potential causes including infectious, ischemic, fluid overload, sarcoidosis, etc. Her BP is borderline low today but at baseline compared to recent outpatient visits. She is not tachypneic or hypoxic. No significant tachycardia. Will check labs and CXR. EKG is unchanged, currently NSR on monitor.   ED Course   Clinical Course  as of 07/31/22 0625  Mon Jul 31, 2022  0401 CBC with mild anemia, not significantly changed from baseline.  [CS]  0416 I personally viewed the images from radiology studies and agree with radiologist interpretation: CXR shows cardiomegaly, but no infiltrates or edema.   [CS]  0419 BMP is normal.  [CS]  0432 BNP is mildly elevated, but lower than previous values. Trop is also borderline elevated, will monitor and repeat [CS]  0617 Repeat Trop not significantly changed. Will check ambulatory SpO2 and if no exertional hypoxia, will likely be able to discharge home.  [CS]  [id number] Patient ambulates without tachycardia or hypoxia. Had to stop once to catch her breath but did not appearing in distress. Recommend she continue medications prescribed by her cardiologist, including the midodrine when BP is low. Follow up with Cards and RTED for any other concerns.  [CS]    Clinical Course User Index [CS] Truddie Hidden, MD     MDM Rules/Calculators/A&P Medical Decision Making Given presenting complaint, I considered that admission might be necessary. After review of results from ED lab and/or imaging studies, admission to the hospital is not indicated at this time.    Problems Addressed: Shortness of breath: acute illness or injury  Amount and/or Complexity of Data Reviewed Labs: ordered. Decision-making details documented in ED Course. Radiology: ordered and independent interpretation  performed. Decision-making details documented in ED Course. ECG/medicine tests: ordered and independent interpretation performed. Decision-making details documented in ED Course.  Risk Prescription drug management. Decision regarding hospitalization.    Final Clinical Impression(s) / ED Diagnoses Final diagnoses:  Shortness of breath    Rx / DC Orders ED Discharge Orders     None        Truddie Hidden, MD 07/31/22 [id number]

## 2022-07-31 NOTE — ED Notes (Signed)
Pt ambulated from room 1 around nurses station where sec sets. Pt O2 stats range from 95% 97%. Pt denies any  pain and denies any sob until she is almost around nurses station. Then she complains of being a little short of breath for a sec. Pt continues on to room and is hooked back up to the monitor.

## 2022-07-31 NOTE — ED Triage Notes (Signed)
Reports sob for about a week.  Hx of CHF.  Saw cardiology on Friday.  Reports they changed her meds around some but she isn't getting any better.

## 2022-08-03 ENCOUNTER — Ambulatory Visit: Payer: Medicaid Other

## 2022-08-03 VITALS — BP 100/76 | HR 88 | Temp 97.0°F | Resp 16 | Ht 67.0 in | Wt 213.4 lb

## 2022-08-03 DIAGNOSIS — Z4502 Encounter for adjustment and management of automatic implantable cardiac defibrillator: Secondary | ICD-10-CM

## 2022-08-03 DIAGNOSIS — Z95810 Presence of automatic (implantable) cardiac defibrillator: Secondary | ICD-10-CM

## 2022-08-03 DIAGNOSIS — R0602 Shortness of breath: Secondary | ICD-10-CM

## 2022-08-03 DIAGNOSIS — I5022 Chronic systolic (congestive) heart failure: Secondary | ICD-10-CM

## 2022-08-03 MED ORDER — TORSEMIDE 20 MG PO TABS: 20.0000 mg | ORAL_TABLET | Freq: Every day | ORAL | 3 refills | Status: DC | PRN

## 2022-08-03 NOTE — Progress Notes (Addendum)
Primary Physician/Referring:  [name], MD  Patient ID: Alicia Cook, female    DOB: 06/09/1966, 56 y.o.   MRN: [id number]  Chief Complaint  Patient presents with   Shortness of Breath   HPI:    Alicia Cook  is a 56 y.o. 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   Since gastric sleeve surgery on 12/20/2020, she had lost 60 pounds in weight but had started to gain weight back.  She has now been started on Wegovy and has continued to lose weight again. Since gastric banding, she also has had difficulty in taking guideline directed medical therapy due to severe hypotension.  She presents today for office visit for shortness or breath, feeling fluid overloaded, and overall complaints of not feeling well. She was recently seen in our office on 9/296/23 and was started on Farixga and Valsartan for symptom management. She was unable to tolerate Valsartan even with the addition of midodrine due to low blood pressures. She presented to ED on 07/31/22 for above symptoms and was discharged without changes.  Past Medical History:  Diagnosis Date   Cataract    CHF (congestive heart failure) ([place])    Depression    Diabetes mellitus without complication ([place])    Encounter for adjustment of biventricular implantable cardioverter-defibrillator (ICD) 06/13/2019   Hypertension    ICD: Cardiac defibrillator in situ 01/13/2015   [employer] Bi-V ICD (Dynagen X4 CRT-D) 01/13/2015 at [facility] - MRI Compatible  Scheduled Remote ICD check 4.22.20: 1 SVT/8 secs, normal function. Battery longevity is 8 years. RA pacing is 1 %, RV pacing is 99 %, and LV pacing is 99  %.   Nonischemic cardiomyopathy ([place])    a. EF 35-40% by cath in 2011 with normal cors b. s/p ICD implantation in 12/2014 c. EF 25% by NST in 02/01/2016   Sarcoidosis    Sarcoidosis 02/08/2016   Past Surgical History:  Procedure Laterality Date   ABDOMINAL SURGERY  12/20/2020    laparoscopic sleeve gastrectomy   CARDIAC CATHETERIZATION N/A 02/08/2016   Procedure: Left Heart Cath and Coronary Angiography;  Surgeon: Jettie Booze, MD;  Location: [place] [facility];  Service: Cardiovascular;  Laterality: N/A;   CATARACT EXTRACTION Right 12/04/2018   CESAREAN SECTION     CHOLECYSTECTOMY     INSERTION OF ICD     a. s/p dual-chamber [employer] ICD 12/2014   LAPAROSCOPIC GASTRIC SLEEVE RESECTION     PACEMAKER INSERTION     RIGHT/LEFT HEART CATH AND CORONARY ANGIOGRAPHY N/A 07/12/2021   Procedure: RIGHT/LEFT HEART CATH AND CORONARY ANGIOGRAPHY;  Surgeon: Nigel Mormon, MD;  Location: [place] [facility];  Service: Cardiovascular;  Laterality: N/A;   TUBAL LIGATION     Family History  Problem Relation Age of Onset   Heart attack Father        a. Initial onset in his 90's. S/p CABG   Heart failure Father     Social History   Tobacco Use   Smoking status: Former    Packs/day: 1.00    Years: 20.00    Total pack years: 20.00    Types: Cigarettes    Quit date: 2016    Years since quitting: 7.7  Smokeless tobacco: Never  Substance Use Topics   Alcohol use: No    Alcohol/week: 0.0 standard drinks of alcohol   Marital Status: Single  ROS  Review of Systems  Cardiovascular:  Positive for dyspnea on exertion. Negative for chest pain, leg swelling and syncope.  Respiratory:  Positive for shortness of breath.    Objective  Blood pressure 100/76, pulse 88, temperature (!) 97 F (36.1 C), temperature source Temporal, resp. rate 16, height 5' 7"  (1.702 m), weight 213 lb 6.4 oz (96.8 kg), last menstrual period 09/14/1991, SpO2 99 %.     08/03/2022    3:31 PM 07/31/2022     6:15 AM 07/31/2022    5:15 AM  Vitals with BMI  Height 5' 7"     Weight 213 lbs 6 oz    BMI 30.16    Systolic 010 98 932  Diastolic 76 80 83  Pulse 88 83    Orthostatic VS for the past 72 hrs (Last 3 readings):  Patient Position BP Location Cuff Size  08/03/22 1531 Sitting Left Arm Large     Physical Exam Constitutional:      Appearance: She is obese.  Neck:     Vascular: No JVD.  Cardiovascular:     Rate and Rhythm: Normal rate and regular rhythm.     Pulses: Intact distal pulses.     Heart sounds: Heart sounds are distant. No murmur heard.    No gallop.  Pulmonary:     Effort: Pulmonary effort is normal.     Breath sounds: Normal breath sounds. No decreased breath sounds, wheezing or rales.  Abdominal:     General: Bowel sounds are normal.     Palpations: Abdomen is soft.  Musculoskeletal:     Right lower leg: No edema.     Left lower leg: No edema.  Neurological:     Mental Status: She is alert.    Laboratory examination:      Latest Ref Rng & Units 07/31/2022    3:45 AM 03/12/2022    5:27 AM 10/05/2021    7:48 AM  CMP  Glucose 70 - 99 mg/dL 99  96  101   BUN 6 - 20 mg/dL 19  17  13    Creatinine 0.44 - 1.00 mg/dL 0.88  0.80  0.73   Sodium 135 - 145 mmol/L 140  141  140   Potassium 3.5 - 5.1 mmol/L 3.8  3.5  3.4   Chloride 98 - 111 mmol/L 110  108  106   CO2 22 - 32 mmol/L 25  28  26    Calcium 8.9 - 10.3 mg/dL 8.9  8.6  9.2   Total Protein 6.5 - 8.1 g/dL  6.6    Total Bilirubin 0.3 - 1.2 mg/dL  0.6    Alkaline Phos 38 - 126 U/L  105    AST 15 - 41 U/L  45    ALT 0 - 44 U/L  47        Latest Ref Rng & Units 07/31/2022    3:45 AM 03/12/2022    5:27 AM 10/05/2021    7:48 AM  CBC  WBC 4.0 - 10.5 K/uL 6.5  7.1  11.4   Hemoglobin 12.0 - 15.0 g/dL 11.9  12.6  13.8   Hematocrit 36.0 - 46.0 % 36.6  38.3  42.0   Platelets 150 - 400 K/uL 199  192  169    BNP    Component Value Date/Time   BNP  494.3 (H) 07/31/2022 0345    ProBNP    Component Value  Date/Time   PROBNP 1,771 (H) 12/07/2021 1632   External labs:  Labs 6/23:  Sodium 142, potassium 4.0, BUN 21, creatinine 0.88, EGFR 78 mL.  Magnesium 2.3.  Hb 13.7/HCT 42.7, platelets 187.  08/23/2021:  A1c 5.6%.  Stage minimally reduced from 0.4 to.  Vitamin D 75.  Total cholesterol 216, triglycerides 109, HDL 74, LDL 120.  Non-HDL cholesterol 142. Allergies  No Known Allergies   Final Medications at End of Visit    Current Outpatient Medications:    torsemide (DEMADEX) 20 MG tablet, Take 1 tablet (20 mg total) by mouth daily as needed., Disp: 90 tablet, Rfl: 3   BIOTIN PO, Take 1 capsule by mouth daily with lunch., Disp: , Rfl:    busPIRone (BUSPAR) 15 MG tablet, Take 15 mg by mouth 2 (two) times daily., Disp: , Rfl:    CALCIUM PO, Take 1 tablet by mouth 2 (two) times daily. chewable, Disp: , Rfl:    carvedilol (COREG) 6.25 MG tablet, TAKE 1 TABLET BY MOUTH TWICE A DAY, Disp: 60 tablet, Rfl: 1   digoxin (LANOXIN) 0.125 MG tablet, Take 1 tablet (0.125 mg total) by mouth daily., Disp: 90 tablet, Rfl: 3   Ferrous Sulfate (IRON PO), Take 1 tablet by mouth daily., Disp: , Rfl:    folic acid (FOLVITE) 1 MG tablet, Take 3 mg by mouth daily with lunch., Disp: , Rfl:    hydrOXYzine (VISTARIL) 25 MG capsule, Take 25 mg by mouth 2 (two) times daily as needed for anxiety., Disp: , Rfl:    magnesium hydroxide (MILK OF MAGNESIA) 400 MG/5ML suspension, Take 30 mLs by mouth daily as needed for mild constipation., Disp: , Rfl:    MAGnesium-Oxide 400 (240 Mg) MG tablet, Take 1 tablet (400 mg total) by mouth daily., Disp: 90 tablet, Rfl: 3   meloxicam (MOBIC) 7.5 MG tablet, Take 1 tablet (7.5 mg total) by mouth daily., Disp: 10 tablet, Rfl: 0   methocarbamol (ROBAXIN) 500 MG tablet, Take 1 tablet (500 mg total) by mouth 2 (two) times daily., Disp: 20 tablet, Rfl: 0   methotrexate (RHEUMATREX) 2.5 MG tablet, Take 20 mg by mouth every Sunday. Caution:Chemotherapy. Protect from light., Disp: , Rfl:     midodrine (PROAMATINE) 10 MG tablet, Take 1 tablet (10 mg total) by mouth 3 (three) times daily with meals as needed., Disp: 90 tablet, Rfl: 0   Multiple Vitamin (MULTIVITAMIN WITH MINERALS) TABS tablet, Take 1 tablet by mouth daily with lunch., Disp: , Rfl:    pantoprazole (PROTONIX) 40 MG tablet, Take 40 mg by mouth daily as needed (acid reflux)., Disp: , Rfl:    Potassium Chloride ER 20 MEQ TBCR, Take 20 mEq by mouth daily as needed. Take when you take demadex, Disp: , Rfl:    Sertraline HCl 200 MG CAPS, Take 200 mg by mouth daily with lunch., Disp: , Rfl:    spironolactone (ALDACTONE) 25 MG tablet, Take 1 tablet (25 mg total) by mouth daily., Disp: 90 tablet, Rfl: 3   tirzepatide (MOUNJARO) 10 MG/0.5ML Pen, Inject 10 mg into the skin once a week., Disp: , Rfl:    valsartan (DIOVAN) 40 MG tablet, Take 1 tablet (40 mg total) by mouth every evening., Disp: 30 tablet, Rfl: 1   vitamin B-12 (CYANOCOBALAMIN) 500 MCG tablet, Take 500 mcg by mouth daily with lunch., Disp: , Rfl:    Vitamin D, Ergocalciferol, (DRISDOL) 1.25 MG (50000 UT)  CAPS capsule, Take 50,000 Units by mouth every Tuesday., Disp: , Rfl:    Radiology:  CTA chest 03/12/2022: 1. No evidence for acute pulmonary embolus. 2. Cardiac enlargement, trace bilateral pleural effusions and mild pulmonary edema compatible with CHF.  DG Chest 2 View 04/19/2021 Stable cardiomegaly. Increased density posterior to the heart border at the lung base may represent atrial enlargement or increasing pericardial effusion.   Chest x-ray 06/08/2021: 1. Interval increase in size of the cardiac silhouette which may  reflect increased cardiomegaly or increased size of the known  pericardial effusion. Consider further evaluation with cardiac echo.  2. Tiny bilateral pleural effusions with bibasilar predominant  interstitial and airspace opacities likely reflecting pulmonary  edema.   Cardiac Studies:   Right heart catheterization and myocardial biopsy  07/17/2016: Mild pulmonary hypertension, mildly elevated pulmonary capillary wedge pressure, biopsy negative for sarcoid.  Cardiac PET sarcoidosis with nuclear medicine perfusion 03/05/2020: A myocardial SPECT scan was performed at rest and contains a large, severe perfusion defect involving the anterior, lateral and anterior lateral walls of the left ventricle, necessitating a FDG-PET viability study.  Physiologic distribution of radiotracer. No substantial accumulation of radiotracer in the area of perfusion defect. Other PET findings: Few FDG avid bilateral axillary lymph nodes, index right axillary node measuring 1 cm (SUV max 3.5). Additionally, there are a few hypermetabolic superior mediastinal lymph nodes. Index right upper paratracheal lymph node shows SUV max 2.2. CT: Few small nodules are noted in right upper lobe, similar to 12/02/2019 CT exam. Subsegmental atelectasis/scarring in left lower lobe. Left chest wall cardiac rhythm maintenance device with distal leads terminating in right atrial appendage and right ventricle and coronary sinus. Small fat-containing of umbilical hernia.   Right and left heart catheterization coronary angiography 07/12/2021: Right heart pressures: LV EDP is normal. RA: 0 mmHg RV: 19/0 mmHg PA: 21/3 mmHg, mPAP 10 mmHg PCW: 4 mmHg CO: 5.7 L/min CI: 2.7 L/min/m2  Conclusion:  Left dominant circulation No coronary artery disease Well compensated nonischemic cardiomyopathy  PET cardiac sarcoidosis 09/30/2021: 1.  Moderate perfusion defect in the lateral wall of the heart.  2.  No definite evidence of myocardial uptake to suggest active myocardial sarcoidosis.  3.  Left ventricular ejection fraction is 18%. Please correlate with cardiac ultrasound  4.  Multilevel FDG avid mediastinal and bilateral hilar lymphadenopathy most compatible with sequela of sarcoidosis.  PCV ECHOCARDIOGRAM COMPLETE 01/25/2022 Moderately depressed LV systolic function with visual  EF 30-35%. Left ventricle cavity is dilated. Mild left ventricular hypertrophy. Regional wall motion abnormalities difficult to assess accurately due to dilated LV cavity, global hypokinesis, and pacemaker.  Doppler evidence of grade II (pseudonormal) diastolic dysfunction, elevated LAP. Left atrial cavity is mildly dilated. IAS bows from left to right suggestive of elevated LAP.Moderate (Grade III) mitral regurgitation, posteriorly directed, wall-impinging jet. Mild tricuspid regurgitation. No evidence of pulmonary hypertension. Small pericardial effusion, predominantly located posteriorly.  There is no hemodynamic significance. Compared to study 06/24/2021 LVEF improved form 25% to 30-35%, G1DD is now Grade 2, otherwise no significant change.   ICD   Remote BiV ICD transmission 06/13/2022: AP 0%, CRT 100%.  Lead impedance and thresholds are stable.  There were no mode switches, 1 brief NSVT episode for 19 seconds.  Normal BiV ICD function.   Scheduled  In office ICD 08/03/22  Single (S)/Dual (D)/BV (M) M Presenting AS-BP Pacer dependant: No. Underlying 2:1 AV Block. AP 0%,  BP 100.% AMS Episodes 0.   HVR 0.   Longevity 3.5  Years/Voltage.  Lead measurements: Stable Histogram: Low (L)/normal (N)/high (H)  Normal Patient activity Normal.  Observations: Normal ICD function.  Changes: None   EKG   EKG 07/28/22: AV paced rhythm with rate 107 bpm.  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  Wide QRS complex.  Compared to previous EKG no significant change.  04/03/2022: AV paced rhythm at rate of 95 bpm.  Biventricular pacing detected.  Wide QRS complex.  No further analysis.  Unchanged compared to previous.   Assessment     ICD-10-CM   1. Encounter for adjustment of biventricular implantable cardioverter-defibrillator (ICD)  Z45.02     2. Chronic HFrEF (heart failure with reduced ejection fraction) (HCC)  I50.22     3. ICD: [employer] Bi-V ICD (Dynagen X4 CRT-D) MRI  compatible ICD in situ  Z95.810     4. SOB (shortness of breath)  R06.02       Meds ordered this encounter  Medications   torsemide (DEMADEX) 20 MG tablet    Sig: Take 1 tablet (20 mg total) by mouth daily as needed.    Dispense:  90 tablet    Refill:  3    Order Specific Question:   Supervising Provider    Answer:   Adrian Prows [[id number]]    Medications Discontinued During This Encounter  Medication Reason   dapagliflozin propanediol (FARXIGA) 10 MG TABS tablet Side effect (s)    Recommendations:   Encounter for adjustment of biventricular implantable cardioverter-defibrillator (ICD) ICD: [employer] Bi-V ICD (Dynagen X4 CRT-D) MRI compatible ICD in situ In office ICD check completed with [employer] rep, normal findings, histograms normal No changes made  Chronic HFrEF (heart failure with reduced ejection fraction) (HCC) SOB (shortness of breath) She is presently on carvedilol 6.25 mg twice daily, spironolactone 25 mg daily, digoxin 0.144m daily Continue valsartan as ordered Will stop Farixga due to pt complaints of low blood glucose levels Will restart torsemide 260mdaily as needed for fluid volume overload and shortness of breath She has echo scheduled Will refer to pulmonology for further evaluation of shortness of breath as this could be asthma or other pulmonary process   Follow-up next week at previously scheduled office visit    BrErnst SpellAGNP-C 08/03/2022, 5:10 PM Office: [phone]

## 2022-08-03 NOTE — Addendum Note (Signed)
Addended by: Ernst Spell on: 08/03/2022 06:05 PM   Modules accepted: Orders

## 2022-08-05 ENCOUNTER — Other Ambulatory Visit: Payer: Self-pay | Admitting: Cardiology

## 2022-08-05 DIAGNOSIS — I5022 Chronic systolic (congestive) heart failure: Secondary | ICD-10-CM

## 2022-08-11 ENCOUNTER — Ambulatory Visit: Payer: Medicaid Other

## 2022-08-28 ENCOUNTER — Encounter: Payer: Self-pay | Admitting: Cardiology

## 2022-08-30 ENCOUNTER — Ambulatory Visit: Payer: Medicaid Other

## 2022-08-30 DIAGNOSIS — I5022 Chronic systolic (congestive) heart failure: Secondary | ICD-10-CM

## 2022-08-31 ENCOUNTER — Telehealth: Payer: Self-pay | Admitting: Cardiology

## 2022-08-31 ENCOUNTER — Encounter: Payer: Self-pay | Admitting: Cardiology

## 2022-08-31 DIAGNOSIS — I5022 Chronic systolic (congestive) heart failure: Secondary | ICD-10-CM

## 2022-08-31 DIAGNOSIS — Z95810 Presence of automatic (implantable) cardiac defibrillator: Secondary | ICD-10-CM

## 2022-08-31 NOTE — Telephone Encounter (Signed)
Chief Complaint  Patient presents with   Congestive Heart Failure    Questions regarding echocardiogram   1. Chronic HFrEF (heart failure with reduced ejection fraction) ([place]) Patient on appropriate medical therapy that is guideline directed.  She was also on Entresto but had to be discontinued due to low blood pressure, she is presently on valsartan instead at 40 mg and hopefully will be able to increase the dose and eventually add Entresto.  Corlanor did not improve her heart rate.  Hence was discontinued.  In view of weight loss after bariatric surgery, very soft blood pressure, with episodes of heart failure, she was also started on digoxin.  She is presently doing well and has remained fairly stable without any acute decompensated heart failure and any recent hospitalization.  However her LVEF has gradually decreased over time.  2. ICD: [employer] Bi-V ICD (Dynagen X4 CRT-D) MRI compatible ICD in situ In spite of CRT-D, her QRS is extremely wide.  I am beginning to wonder if we can look at device replacement and lead replacement to see if he can narrow the QRS complex and whether this would have any impact on her LVEF and also improvement in class II-III symptoms of dyspnea and fatigue.  I will make a referral to EP for consideration of this and get their opinion.  Patient will eventually need to establish with advanced heart failure as well, however presently stable.  I may consider doing his soon.  I have discussed the above with the patient today.  PCV ECHOCARDIOGRAM COMPLETE 08/30/2022  Narrative Echocardiogram 08/30/2022: Severely depressed LV systolic function with visual EF 20-25%. Left ventricle cavity is severely dilated. Dilated cardiomyopathy. Normal left ventricular wall thickness. Normal global wall motion. Abnormal septal wall motion due to right ventricle pacemaker. Doppler evidence of grade II (pseudonormal) diastolic dysfunction, elevated LAP. Calculated EF 24%. Right  ventricle cavity is normal in size. Right ventricle pacemaker visualized. Mildly reduced right ventricular function. Native mitral valve.  Moderate to severe mitral regurgitation. Compared to 12/2021, EF is slightly worse.   PCV ECHOCARDIOGRAM COMPLETE 01/25/2022  Narrative Echocardiogram 01/25/2022: Moderately depressed LV systolic function with visual EF 30-35%. Left ventricle cavity is dilated. Mild left ventricular hypertrophy. Regional wall motion abnormalities difficult to assess accurately due to dilated LV cavity, global hypokinesis, and pacemaker.  Doppler evidence of grade II (pseudonormal) diastolic dysfunction, elevated LAP. Left atrial cavity is mildly dilated. IAS bows from left to right suggestive of elevated LAP. Moderate (Grade III) mitral regurgitation, posteriorly directed, wall-impinging jet. Mild tricuspid regurgitation. No evidence of pulmonary hypertension. Small pericardial effusion, predominantly located posteriorly.  There is no hemodynamic significance. Compared to study 06/24/2021 LVEF improved form 25% to 30-35%, G1DD is now Grade 2, otherwise no significant change.     Adrian Prows, MD, [facility] 08/31/2022, 6:21 PM Office: [phone] Fax: [phone] Pager: [phone]

## 2022-09-01 NOTE — Telephone Encounter (Signed)
From patient.

## 2022-09-04 NOTE — Telephone Encounter (Signed)
Alicia Cook, I am curious about her programming. I will see her in the office. Her LV lead is in a good spot for pacing but it looks like we might be able to help things.

## 2022-09-05 ENCOUNTER — Ambulatory Visit: Payer: Medicaid Other

## 2022-09-06 NOTE — Telephone Encounter (Signed)
Forwarding Dr Lubertha Basque request to add Pt to our clinic schedule per Dr. Jacinto Halim note.

## 2022-09-14 ENCOUNTER — Encounter: Payer: Medicaid Other | Admitting: Internal Medicine

## 2022-09-14 ENCOUNTER — Ambulatory Visit: Payer: Medicaid Other | Admitting: Internal Medicine

## 2022-09-29 ENCOUNTER — Ambulatory Visit: Payer: Medicaid Other | Attending: Internal Medicine | Admitting: Internal Medicine

## 2022-09-29 VITALS — BP 94/56 | HR 97 | Resp 20 | Wt 209.0 lb

## 2022-09-29 DIAGNOSIS — Z95810 Presence of automatic (implantable) cardiac defibrillator: Secondary | ICD-10-CM

## 2022-09-29 DIAGNOSIS — I5022 Chronic systolic (congestive) heart failure: Secondary | ICD-10-CM

## 2022-09-29 DIAGNOSIS — I428 Other cardiomyopathies: Secondary | ICD-10-CM

## 2022-09-29 NOTE — Patient Instructions (Addendum)
Medication Instructions:  Your physician recommends that you continue on your current medications as directed. Please refer to the Current Medication list given to you today.  *If you need a refill on your cardiac medications before your next appointment, please call your pharmacy*  Lab Work: None ordered.  If you have labs (blood work) drawn today and your tests are completely normal, you will receive your results only by: MyChart Message (if you have MyChart) OR A paper copy in the mail If you have any lab test that is abnormal or we need to change your treatment, we will call you to review the results.  Testing/Procedures: None ordered.  Follow-Up: At [facility], you and your health needs are our priority.  As part of our continuing mission to provide you with exceptional heart care, we have created designated Provider Care Teams.  These Care Teams include your primary Cardiologist (physician) and Advanced Practice Providers (APPs -  Physician Assistants and Nurse Practitioners) who all work together to provide you with the care you need, when you need it.  We recommend signing up for the patient portal called "MyChart".  Sign up information is provided on this After Visit Summary.  MyChart is used to connect with patients for Virtual Visits (Telemedicine).  Patients are able to view lab/test results, encounter notes, upcoming appointments, etc.  Non-urgent messages can be sent to your provider as well.   To learn more about what you can do with MyChart, go to [email or web address].    Your next appointment:   As Needed  The format for your next appointment:   In Person  Provider:   Lewayne Bunting, MD{or one of the following Advanced Practice Providers on your designated Care Team:   Francis Dowse, [place] Casimiro Needle "[facility]" [place], [place]

## 2022-09-29 NOTE — Progress Notes (Signed)
HPI Alicia Cook is referred by Dr. Jacinto Halim for a prolonged ECG signal on her ECG in the setting of prior biv ICD. The patient has a non-ischemic CM, s/p Biv ICD insertion. She had LBBB and her device had been set at Biv pacing with pacing in the RV lead turned off. She has developed CHB and is effectively only pacing in the LV. She has class 2 symptoms. Her paced QRS is over 200 ms.  No Known Allergies   Current Outpatient Medications  Medication Sig Dispense Refill   BIOTIN PO Take 1 capsule by mouth daily with lunch.     busPIRone (BUSPAR) 15 MG tablet Take 15 mg by mouth 2 (two) times daily.     CALCIUM PO Take 1 tablet by mouth 2 (two) times daily. chewable     carvedilol (COREG) 6.25 MG tablet TAKE 1 TABLET BY MOUTH TWICE A DAY 180 tablet 1   digoxin (LANOXIN) 0.125 MG tablet Take 1 tablet (0.125 mg total) by mouth daily. 90 tablet 3   Ferrous Sulfate (IRON PO) Take 1 tablet by mouth daily.     folic acid (FOLVITE) 1 MG tablet Take 3 mg by mouth daily with lunch.     hydrOXYzine (VISTARIL) 25 MG capsule Take 25 mg by mouth 2 (two) times daily as needed for anxiety.     magnesium hydroxide (MILK OF MAGNESIA) 400 MG/5ML suspension Take 30 mLs by mouth daily as needed for mild constipation.     MAGnesium-Oxide 400 (240 Mg) MG tablet Take 1 tablet (400 mg total) by mouth daily. 90 tablet 3   meloxicam (MOBIC) 7.5 MG tablet Take 1 tablet (7.5 mg total) by mouth daily. 10 tablet 0   methocarbamol (ROBAXIN) 500 MG tablet Take 1 tablet (500 mg total) by mouth 2 (two) times daily. 20 tablet 0   methotrexate (RHEUMATREX) 2.5 MG tablet Take 20 mg by mouth every Sunday. Caution:Chemotherapy. Protect from light.     midodrine (PROAMATINE) 10 MG tablet Take 1 tablet (10 mg total) by mouth 3 (three) times daily with meals as needed. 90 tablet 0   Multiple Vitamin (MULTIVITAMIN WITH MINERALS) TABS tablet Take 1 tablet by mouth daily with lunch.     pantoprazole (PROTONIX) 40 MG tablet Take 40 mg  by mouth daily as needed (acid reflux).     Potassium Chloride ER 20 MEQ TBCR Take 20 mEq by mouth daily as needed. Take when you take demadex     Sertraline HCl 200 MG CAPS Take 200 mg by mouth daily with lunch.     spironolactone (ALDACTONE) 25 MG tablet Take 1 tablet (25 mg total) by mouth daily. 90 tablet 3   tirzepatide (MOUNJARO) 10 MG/0.5ML Pen Inject 10 mg into the skin once a week.     torsemide (DEMADEX) 20 MG tablet Take 1 tablet (20 mg total) by mouth daily as needed. 90 tablet 3   valsartan (DIOVAN) 40 MG tablet Take 1 tablet (40 mg total) by mouth every evening. 30 tablet 1   vitamin B-12 (CYANOCOBALAMIN) 500 MCG tablet Take 500 mcg by mouth daily with lunch.     Vitamin D, Ergocalciferol, (DRISDOL) 1.25 MG (50000 UT) CAPS capsule Take 50,000 Units by mouth every Tuesday.     No current facility-administered medications for this visit.     Past Medical History:  Diagnosis Date   Cataract    CHF (congestive heart failure) (HCC)    Depression    Diabetes mellitus without  complication (HCC)    Encounter for adjustment of biventricular implantable cardioverter-defibrillator (ICD) 06/13/2019   Hypertension    ICD: Cardiac defibrillator in situ 01/13/2015   [employer] Bi-V ICD (Dynagen X4 CRT-D) 01/13/2015 at [facility] - [facility] Comp[facility]  Scheduled Remote ICD check 4.22.20: 1 SVT/8 secs, normal function. Battery longevity is 8 years. RA pacing is 1 %, RV pacing is 99 %, and LV pacing is 99 %.   Nonischemic cardiomyopathy (HCC)    a. EF 35-40% by cath in 2011 with normal cors b. s/p ICD implantation in 12/2014 c. EF 25% by NST in 02/01/2016   Sarcoidosis    Sarcoidosis 02/08/2016    ROS:   All systems reviewed and negative except as noted in the HPI.   Past Surgical History:  Procedure Laterality Date   ABDOMINAL SURGERY  12/20/2020    laparoscopic sleeve gastrectomy   CARDIAC CATHETERIZATION N/A 02/08/2016   Procedure: Left Heart Cath and Coronary Angiography;   Surgeon: Corky Crafts, MD;  Location: [facility] INVASIVE CV LAB;  Service: Cardiovascular;  Laterality: N/A;   CATARACT EXTRACTION Right 12/04/2018   CESAREAN SECTION     CHOLECYSTECTOMY     INSERTION OF ICD     a. s/p dual-chamber [employer] ICD 12/2014   LAPAROSCOPIC GASTRIC SLEEVE RESECTION     PACEMAKER INSERTION     RIGHT/LEFT HEART CATH AND CORONARY ANGIOGRAPHY N/A 07/12/2021   Procedure: RIGHT/LEFT HEART CATH AND CORONARY ANGIOGRAPHY;  Surgeon: Elder Negus, MD;  Location: [facility] INVASIVE CV LAB;  Service: Cardiovascular;  Laterality: N/A;   TUBAL LIGATION       Family History  Problem Relation Age of Onset   Heart attack Father        a. Initial onset in his 36's. S/p CABG   Heart failure Father      Social History   Socioeconomic History   Marital status: Single    Spouse name: Not on file   Number of children: 5   Years of education: Not on file   Highest education level: Not on file  Occupational History   Not on file  Tobacco Use   Smoking status: Former    Packs/day: 1.00    Years: 20.00    Total pack years: 20.00    Types: Cigarettes    Quit date: 2016    Years since quitting: 7.9   Smokeless tobacco: Never  Vaping Use   Vaping Use: Never used  Substance and Sexual Activity   Alcohol use: No    Alcohol/week: 0.0 standard drinks of alcohol   Drug use: No   Sexual activity: Not on file  Other Topics Concern   Not on file  Social History Narrative   Not on file   Social Determinants of Health   Financial Resource Strain: Not on file  Food Insecurity: Not on file  Transportation Needs: Not on file  Physical Activity: Not on file  Stress: Not on file  Social Connections: Not on file  Intimate Partner Violence: Not on file     BP (!) 94/56 (BP Location: Left Arm)   Pulse 97   Resp 20   Wt 209 lb (94.8 kg)   LMP 09/14/1991 (Within Weeks)   SpO2 98%   BMI 32.73 kg/m   Physical Exam:  Well appearing NAD HEENT:  Unremarkable Neck:  No JVD, no thyromegally Lymphatics:  No adenopathy Back:  No CVA tenderness Lungs:  Clear with no wheezes HEART:  Regular rate rhythm,  no murmurs, no rubs, no clicks Abd:  soft, positive bowel sounds, no organomegally, no rebound, no guarding Ext:  2 plus pulses, no edema, no cyanosis, no clubbing Skin:  No rashes no nodules Neuro:  CN II through XII intact, motor grossly intact  EKG - nsr with pacing induced RBBB (QRS 204) After reprogramming, QRS 140  DEVICE  Normal device function.  See PaceArt for details.   Assess/Plan: CHB - the patient has developed CHB and was pacing only in the LV. RV pacing had been turned off.  Biv ICD - her device is working normally but we turned on RV pacing with a LV offset. Chronic systolic heart failure - she is class 2. Hopefully she will feel better with a narrower QRS. Sharlot Gowda Soma Bachand,MD

## 2022-09-30 ENCOUNTER — Emergency Department (HOSPITAL_BASED_OUTPATIENT_CLINIC_OR_DEPARTMENT_OTHER): Payer: Medicaid Other

## 2022-09-30 ENCOUNTER — Encounter (HOSPITAL_BASED_OUTPATIENT_CLINIC_OR_DEPARTMENT_OTHER): Payer: Self-pay | Admitting: Emergency Medicine

## 2022-09-30 ENCOUNTER — Emergency Department (HOSPITAL_BASED_OUTPATIENT_CLINIC_OR_DEPARTMENT_OTHER)
Admission: EM | Admit: 2022-09-30 | Discharge: 2022-09-30 | Disposition: A | Discharge status: Home / Self Care | Payer: Medicaid Other | Attending: Emergency Medicine | Admitting: Emergency Medicine

## 2022-09-30 ENCOUNTER — Encounter: Payer: Self-pay | Admitting: Cardiology

## 2022-09-30 ENCOUNTER — Other Ambulatory Visit: Payer: Self-pay

## 2022-09-30 DIAGNOSIS — I110 Hypertensive heart disease with heart failure: Secondary | ICD-10-CM | POA: Diagnosis not present

## 2022-09-30 DIAGNOSIS — I5020 Unspecified systolic (congestive) heart failure: Secondary | ICD-10-CM | POA: Diagnosis not present

## 2022-09-30 DIAGNOSIS — Z79899 Other long term (current) drug therapy: Secondary | ICD-10-CM | POA: Diagnosis not present

## 2022-09-30 DIAGNOSIS — R0789 Other chest pain: Secondary | ICD-10-CM | POA: Insufficient documentation

## 2022-09-30 DIAGNOSIS — Z950 Presence of cardiac pacemaker: Secondary | ICD-10-CM | POA: Insufficient documentation

## 2022-09-30 DIAGNOSIS — E039 Hypothyroidism, unspecified: Secondary | ICD-10-CM | POA: Diagnosis not present

## 2022-09-30 DIAGNOSIS — E119 Type 2 diabetes mellitus without complications: Secondary | ICD-10-CM | POA: Diagnosis not present

## 2022-09-30 HISTORY — DX: Gastro-esophageal reflux disease without esophagitis: K21.9

## 2022-09-30 LAB — CBC WITH DIFFERENTIAL/PLATELET
Abs Immature Granulocytes: 0.04 10*3/uL (ref 0.00–0.07)
Basophils Absolute: 0 10*3/uL (ref 0.0–0.1)
Basophils Relative: 0 %
Eosinophils Absolute: 0.1 10*3/uL (ref 0.0–0.5)
Eosinophils Relative: 1 %
HCT: 41.3 % (ref 36.0–46.0)
Hemoglobin: 13.4 g/dL (ref 12.0–15.0)
Immature Granulocytes: 1 %
Lymphocytes Relative: 11 %
Lymphs Abs: 1 10*3/uL (ref 0.7–4.0)
MCH: 28.9 pg (ref 26.0–34.0)
MCHC: 32.4 g/dL (ref 30.0–36.0)
MCV: 89 fL (ref 80.0–100.0)
Monocytes Absolute: 0.6 10*3/uL (ref 0.1–1.0)
Monocytes Relative: 6 %
Neutro Abs: 7.1 10*3/uL (ref 1.7–7.7)
Neutrophils Relative %: 81 %
Platelets: 230 10*3/uL (ref 150–400)
RBC: 4.64 MIL/uL (ref 3.87–5.11)
RDW: 13.2 % (ref 11.5–15.5)
WBC: 8.8 10*3/uL (ref 4.0–10.5)
nRBC: 0 % (ref 0.0–0.2)

## 2022-09-30 LAB — BASIC METABOLIC PANEL
Anion gap: 7 (ref 5–15)
BUN: 16 mg/dL (ref 6–20)
CO2: 25 mmol/L (ref 22–32)
Calcium: 9.2 mg/dL (ref 8.9–10.3)
Chloride: 108 mmol/L (ref 98–111)
Creatinine, Ser: 0.93 mg/dL (ref 0.44–1.00)
GFR, Estimated: >60 mL/min (ref >60)
Glucose, Bld: 92 mg/dL (ref 70–99)
Potassium: 3.3 mmol/L — ABNORMAL LOW (ref 3.5–5.1)
Sodium: 140 mmol/L (ref 135–145)

## 2022-09-30 LAB — BRAIN NATRIURETIC PEPTIDE: B Natriuretic Peptide: 907.8 pg/mL — ABNORMAL HIGH (ref 0.0–100.0)

## 2022-09-30 LAB — D-DIMER, QUANTITATIVE: D-Dimer, Quant: 7.67 ug/mL-FEU — ABNORMAL HIGH (ref 0.00–0.50)

## 2022-09-30 LAB — TROPONIN I (HIGH SENSITIVITY)
Troponin I (High Sensitivity): 42 ng/L — ABNORMAL HIGH (ref <18)
Troponin I (High Sensitivity): 42 ng/L — ABNORMAL HIGH (ref <18)

## 2022-09-30 MED ORDER — POTASSIUM CHLORIDE CRYS ER 20 MEQ PO TBCR
40.0000 meq | EXTENDED_RELEASE_TABLET | Freq: Once | ORAL | Status: AC
  Administered 2022-09-30: 40 meq via ORAL
  Filled 2022-09-30: qty 2

## 2022-09-30 MED ORDER — BENZONATATE 100 MG PO CAPS
100.0000 mg | ORAL_CAPSULE | Freq: Once | ORAL | Status: AC
  Administered 2022-09-30: 100 mg via ORAL
  Filled 2022-09-30: qty 1

## 2022-09-30 MED ORDER — FUROSEMIDE 10 MG/ML IJ SOLN
40.0000 mg | Freq: Once | INTRAMUSCULAR | Status: AC
  Administered 2022-09-30: 40 mg via INTRAVENOUS
  Filled 2022-09-30: qty 4

## 2022-09-30 MED ORDER — AMOXICILLIN-POT CLAVULANATE 875-125 MG PO TABS: 1.0000 | ORAL_TABLET | Freq: Two times a day (BID) | ORAL | 0 refills | Status: AC

## 2022-09-30 MED ORDER — AMOXICILLIN-POT CLAVULANATE 875-125 MG PO TABS
1.0000 | ORAL_TABLET | Freq: Once | ORAL | Status: AC
  Administered 2022-09-30: 1 via ORAL
  Filled 2022-09-30: qty 1

## 2022-09-30 MED ORDER — IPRATROPIUM-ALBUTEROL 0.5-2.5 (3) MG/3ML IN SOLN
3.0000 mL | Freq: Once | RESPIRATORY_TRACT | Status: DC
  Filled 2022-09-30: qty 3

## 2022-09-30 MED ORDER — IOHEXOL 350 MG/ML SOLN
75.0000 mL | Freq: Once | INTRAVENOUS | Status: AC | PRN
  Administered 2022-09-30: 75 mL via INTRAVENOUS

## 2022-09-30 NOTE — ED Provider Notes (Signed)
[facility] EMERGENCY DEPARTMENT Provider Note   CSN: [id number] Arrival date & time: 09/30/22  9509     History Past medical history includes hypertension, nonischemic cardiomyopathy, HFrEF with last EF 20 to 25% in November 2023, status post ICD, diabetes type 2, OSA, acquired hypothyroidism, GERD, upper lipidemia, history of sarcoidosis on methotrexate, depression Chief Complaint  Patient presents with   Chest Pain   Cough    Alicia Cook is a 56 y.o. female.  Presenting to the ED with chest tightness and cough.  She says last night while she was having sexual activity for the first time in 3 years she started having some tightness in the middle of her chest when breathing.  She says she was starting to feel like she could not get air and so she started panicking and breathing very fast.  She then said she coughed up a small amount of blood which is never happened before.  She says over the past year or so she has had intermittent episodes like this anytime she exerts herself.  She has a home nebulizer and inhaler that she uses that helps ease the symptoms.  She has tried [employer] cough suppressant does not only think they have helped.  She currently sees a pulmonologist and cardiologist.  She says she just saw her cardiologist yesterday and had her ICD checked.  She says she has been here multiple times with similar symptoms and always had negative workups.  She says she does have a history of prior smoking that she smoked for 20 years, but quit about 3 years ago. Denies history of blood clots.  No recent surgeries.  She denies any abdominal pain, nausea, vomiting, fevers, chills, numbness or weakness, leg swelling or pain, no recent cold symptoms such as sore throat or congestion.    Chest Pain Associated symptoms: cough   Cough Associated symptoms: chest pain        Home Medications Prior to Admission medications   Medication Sig Start Date End Date Taking?  Authorizing Provider  amoxicillin-clavulanate (AUGMENTIN) 875-125 MG tablet Take 1 tablet by mouth every 12 (twelve) hours for 5 days. 09/30/22 10/05/22 Yes Odysseus Cada, Finis Bud, PA-C  BIOTIN PO Take 1 capsule by mouth daily with lunch.    [provider]  busPIRone (BUSPAR) 15 MG tablet Take 15 mg by mouth 2 (two) times daily. 03/24/22   [provider]  CALCIUM PO Take 1 tablet by mouth 2 (two) times daily. chewable    [provider]  carvedilol (COREG) 6.25 MG tablet TAKE 1 TABLET BY MOUTH TWICE A DAY 08/07/22   Yates Decamp, MD  digoxin (LANOXIN) 0.125 MG tablet Take 1 tablet (0.125 mg total) by mouth daily. 07/28/21   Cantwell, Celeste C, PA-C  Ferrous Sulfate (IRON PO) Take 1 tablet by mouth daily.    [provider]  folic acid (FOLVITE) 1 MG tablet Take 3 mg by mouth daily with lunch.    [provider]  hydrOXYzine (VISTARIL) 25 MG capsule Take 25 mg by mouth 2 (two) times daily as needed for anxiety. 11/26/20   [provider]  magnesium hydroxide (MILK OF MAGNESIA) 400 MG/5ML suspension Take 30 mLs by mouth daily as needed for mild constipation.    [provider]  MAGnesium-Oxide 400 (240 Mg) MG tablet Take 1 tablet (400 mg total) by mouth daily. 05/15/22   Yates Decamp, MD  meloxicam (MOBIC) 7.5 MG tablet Take 1 tablet (7.5 mg total) by mouth  daily. 02/25/22   Renne Crigler, PA-C  methocarbamol (ROBAXIN) 500 MG tablet Take 1 tablet (500 mg total) by mouth 2 (two) times daily. 02/25/22   Renne Crigler, PA-C  methotrexate (RHEUMATREX) 2.5 MG tablet Take 20 mg by mouth every Sunday. Caution:Chemotherapy. Protect from light.    [provider]  midodrine (PROAMATINE) 10 MG tablet Take 1 tablet (10 mg total) by mouth 3 (three) times daily with meals as needed. 04/18/22   Yates Decamp, MD  Multiple Vitamin (MULTIVITAMIN WITH MINERALS) TABS tablet Take 1 tablet by mouth daily with lunch.    [provider]  pantoprazole  (PROTONIX) 40 MG tablet Take 40 mg by mouth daily as needed (acid reflux). 01/23/20   [provider]  Potassium Chloride ER 20 MEQ TBCR Take 20 mEq by mouth daily as needed. Take when you take demadex 07/19/21   Rodolph Bong, MD  Sertraline HCl 200 MG CAPS Take 200 mg by mouth daily with lunch.    [provider]  spironolactone (ALDACTONE) 25 MG tablet Take 1 tablet (25 mg total) by mouth daily. 04/03/22 03/29/23  Cantwell, Celeste C, PA-C  tirzepatide [facility]) 10 MG/0.5ML Pen Inject 10 mg into the skin once a week. 08/01/22   [provider]  torsemide (DEMADEX) 20 MG tablet Take 1 tablet (20 mg total) by mouth daily as needed. 08/03/22 07/29/23  Nori Riis, NP  valsartan (DIOVAN) 40 MG tablet Take 1 tablet (40 mg total) by mouth every evening. 07/28/22   Nori Riis, NP  vitamin B-12 (CYANOCOBALAMIN) 500 MCG tablet Take 500 mcg by mouth daily with lunch.    [provider]  Vitamin D, Ergocalciferol, (DRISDOL) 1.25 MG (50000 UT) CAPS capsule Take 50,000 Units by mouth every Tuesday. 09/20/18   [provider]      Allergies    Patient has no known allergies.    Review of Systems   Review of Systems  Respiratory:  Positive for cough and chest tightness.   Cardiovascular:  Positive for chest pain.  All other systems reviewed and are negative.   Physical Exam Updated Vital Signs BP (!) 151/100   Pulse 94   Temp 97.9 F (36.6 C) (Oral)   Resp (!) 36   Ht  (1.702 m)   Wt 94.8 kg   LMP 09/14/1991 (Within Weeks)   SpO2 99%   BMI 32.73 kg/m  Physical Exam Vitals and nursing note reviewed.  Constitutional:      General: She is not in acute distress.    Appearance: Normal appearance. She is well-developed. She is not ill-appearing, toxic-appearing or diaphoretic.  HENT:     Head: Normocephalic and atraumatic.     Nose: No nasal deformity.     Mouth/Throat:     Lips: Pink. No lesions.  Eyes:     General: Gaze  aligned appropriately. No scleral icterus.       Right eye: No discharge.        Left eye: No discharge.     Conjunctiva/sclera: Conjunctivae normal.     Right eye: Right conjunctiva is not injected. No exudate or hemorrhage.    Left eye: Left conjunctiva is not injected. No exudate or hemorrhage. Neck:     Vascular: No JVD.  Cardiovascular:     Rate and Rhythm: Normal rate and regular rhythm.     Pulses: Normal pulses.     Heart sounds: Normal heart sounds. Heart sounds not distant. No murmur heard.    No  systolic murmur is present.     No diastolic murmur is present.     No friction rub. No gallop. No S3 or S4 sounds.  Pulmonary:     Effort: Pulmonary effort is normal. No tachypnea, accessory muscle usage or respiratory distress.     Breath sounds: No stridor. No decreased breath sounds, wheezing, rhonchi or rales.  Abdominal:     General: Abdomen is flat. There is no distension.     Palpations: Abdomen is soft. There is no mass.     Tenderness: There is no abdominal tenderness. There is no right CVA tenderness, left CVA tenderness, guarding or rebound.     Hernia: No hernia is present.  Musculoskeletal:     Right lower leg: No tenderness. No edema.     Left lower leg: No tenderness. No edema.  Skin:    General: Skin is warm and dry.  Neurological:     General: No focal deficit present.     Mental Status: She is alert and oriented to person, place, and time.  Psychiatric:        Mood and Affect: Mood normal. Mood is not anxious.        Speech: Speech normal.        Behavior: Behavior normal. Behavior is cooperative.     ED Results / Procedures / Treatments   Labs (all labs ordered are listed, but only abnormal results are displayed) Labs Reviewed  BASIC METABOLIC PANEL - Abnormal; Notable for the following components:      Result Value   Potassium 3.3 (*)    All other components within normal limits  BRAIN NATRIURETIC PEPTIDE - Abnormal; Notable for the following  components:   B Natriuretic Peptide 907.8 (*)    All other components within normal limits  D-DIMER, QUANTITATIVE - Abnormal; Notable for the following components:   D-Dimer, Quant 7.67 (*)    All other components within normal limits  TROPONIN I (HIGH SENSITIVITY) - Abnormal; Notable for the following components:   Troponin I (High Sensitivity) 42 (*)    All other components within normal limits  TROPONIN I (HIGH SENSITIVITY) - Abnormal; Notable for the following components:   Troponin I (High Sensitivity) 42 (*)    All other components within normal limits  CBC WITH DIFFERENTIAL/PLATELET    EKG EKG Interpretation  Date/Time:  Saturday September 30 2022 09:17:08 EST Ventricular Rate:  94 PR Interval:  140 QRS Duration: 145 QT Interval:  461 QTC Calculation: 577 R Axis:   259 Text Interpretation: Atrial-sensed ventricular-paced rhythm No further analysis attempted due to paced rhythm Confirmed by Edwin Dada ([id number]) on 09/30/2022 10:02:42 AM  Radiology CT Angio Chest PE W and/or Wo Contrast  Result Date: 09/30/2022 CLINICAL DATA:  Shortness of breath.  Positive D-dimers. EXAM: CT ANGIOGRAPHY CHEST WITH CONTRAST TECHNIQUE: Multidetector CT imaging of the chest was performed using the standard protocol during bolus administration of intravenous contrast. Multiplanar CT image reconstructions and MIPs were obtained to evaluate the vascular anatomy. RADIATION DOSE REDUCTION: This exam was performed according to the departmental dose-optimization program which includes automated exposure control, adjustment of the mA and/or kV according to patient size and/or use of iterative reconstruction technique. CONTRAST:  64mL OMNIPAQUE IOHEXOL 350 MG/ML SOLN COMPARISON:  Mar 12, 2022 FINDINGS: Cardiovascular: Satisfactory opacification of the pulmonary arteries to the segmental level. No evidence of pulmonary embolism. The heart size is enlarged. Left chest wall ICD noted unchanged. Pericardial effusion  is noted. Mediastinum/Nodes: Mildly enlarged mediastinal  lymph nodes are identified. The trachea esophagus and thyroid gland are unremarkable. Lungs/Pleura: Patchy hazy ground-glass opacities are identified in right middle lobe, bilateral lower lobes anterior lesser degree in the inferior portion of the left upper lobe. Minimal bilateral pleural effusions are identified. Upper Abdomen: No acute abnormality. Status post prior sleeve gastrectomy. Musculoskeletal: Degenerative joint changes of the spine are noted. Scoliosis of spine noted. Review of the MIP images confirms the above findings. IMPRESSION: 1. No pulmonary embolus. 2. Patchy hazy ground-glass opacities are identified in right middle lobe, bilateral lower lobes, anterior lesser degree in the inferior portion of the left upper lobe. This is nonspecific but can be seen in pneumonia. 3. Minimal bilateral pleural effusions. 4. Cardiomegaly with pericardial effusion. 5. Mildly enlarged mediastinal lymph nodes, nonspecific. Favor reactive lymph nodes. Electronically Signed   By: Sherian Rein M.D.   On: 09/30/2022 11:02   DG Chest 2 View  Result Date: 09/30/2022 CLINICAL DATA:  Chest tightness EXAM: CHEST - 2 VIEW COMPARISON:  07/31/2022 FINDINGS: Cardiomegaly. Stable mediastinal contours. Similar positioning of biventricular pacer leads. Hazy appearance of the bilateral lower chest including small pleural effusions. No pneumothorax. On the lateral view there is fissure thickening. IMPRESSION: Mild CHF findings. Electronically Signed   By: Tiburcio Pea M.D.   On: 09/30/2022 09:42    Procedures Procedures  This patient was on telemetry or cardiac monitoring during their time in the ED.    Medications Ordered in ED Medications  benzonatate (TESSALON) capsule 100 mg (100 mg Oral Given 09/30/22 0950)  potassium chloride SA (KLOR-CON M) CR tablet 40 mEq (40 mEq Oral Given 09/30/22 1021)  iohexol (OMNIPAQUE) 350 MG/ML injection 75 mL (75 mLs  Intravenous Contrast Given 09/30/22 1039)  furosemide (LASIX) injection 40 mg (40 mg Intravenous Given 09/30/22 1210)  amoxicillin-clavulanate (AUGMENTIN) 875-125 MG per tablet 1 tablet (1 tablet Oral Given 09/30/22 1210)    ED Course/ Medical Decision Making/ A&P Clinical Course as of 09/30/22 1236  Sat Sep 30, 2022  0945 I have personally reviewed Chest XR. Cardiomegaly is present but stable from prior. Positioning of pacer leads stable.  Hazy appearance of the bilateral lower chest including small pleural effusions which is worse from prior. No pneumothorax. On the lateral view there is fissure thickening.  [GL]  1013 Potassium 3.3. Replacing with 40 mEq KCl [GL]  1017 D-Dimer, Quant(!): 7.67 D dimer significantly elevated from prior. Will proceed with CTA chest to evaluate [GL]  1024 Troponin I (High Sensitivity)(!): 42 This is elevated from most recent value. Will trend. [GL]  1024 B Natriuretic Peptide(!): 907.8 Somewhat elevated compared to prior.  [GL]  1107 CT Angio Chest PE W and/or Wo Contrast 1. No pulmonary embolus. 2. Patchy hazy ground-glass opacities are identified in right middle lobe, bilateral lower lobes, anterior lesser degree in the inferior portion of the left upper lobe. This is nonspecific but can be seen in pneumonia. 3. Minimal bilateral pleural effusions. 4. Cardiomegaly with pericardial effusion. 5. Mildly enlarged mediastinal lymph nodes, nonspecific. Favor reactive lymph nodes.   [GL]  1109 Pericardial effusion noted on CT appears unchanged from prior. Patchy hazy ground glass opacities could be pulmonary edema versus PNA. She has reactive lymph nodes, so this may favor PNA, but the opacities have been seen on prior imaging [GL]    Clinical Course User Index [GL] Kiyoko Mcguirt, Finis Bud, PA-C  Medical Decision Making Amount and/or Complexity of Data Reviewed Labs: ordered. Decision-making details documented in ED  Course. Radiology: ordered. Decision-making details documented in ED Course.  Risk Prescription drug management.    MDM  This is a 56 y.o. female who presents to the ED with chest tightness, hemoptysis, and cough The differential of this patient includes but is not limited to chronic bronchitis, PE, advanced heart failure, ACS, pneumonia, etc.  Initial Impression  She is well-appearing in no acute distress.  She is afebrile with stable vitals. Her pulm exam is actually with clear lungs and she has no decreased air movement. She does not appear to be hypervolemic. Her exam is overall unremarkable. She is continually clearing her throat with me. I reviewed past notes and imaging which include a prior CTA chest was negative for PE in May 2023, she has a recent echo in early November 2023 with EF of 25%, she recently had a pacemaker check just yesterday and had RV pacing turned on, she last saw her pulmonologist in October 2023, there are multiple records of similar complaints over the past year.  The only new symptom is the hemoptysis that occurred last night. Since she has been having new hemoptysis in addition to chronic symptoms, we will obtain a new workup including D-dimer to screen for PE, cardiac markers, BNP, basic labs, EKG, and chest x-ray  I personally ordered, reviewed, and interpreted all laboratory work and imaging and agree with radiologist interpretation. Results interpreted below:  CBC is completely unremarkable.  BMP shows potassium 3.3, without any evidence of renal dysfunction or any other electrolyte abnormalities.  Her troponin is 42 x 2.  D-dimer is 7.67.  BNP is 907.  Chest x-ray revealed findings of possible fluid overload with bilateral small pleural effusions, and fissure thickening as well as hazy appearance of bilateral lower chest.  There is cardiomegaly present which is stable from prior.  Pacer leads positioning is stable.  No evidence of pneumothorax.  EKG revealed  atrial paced rhythm.   CTA chest: Findings include negative for PE, there is patchy hazy groundglass opacities in the right middle lobe, bilateral lower lobes, and anterior lesser degree in the inferior portion of the left upper lobe.  She has associated mildly enlarged mediastinal lymph nodes.  She has cardiomegaly with associated.  Pericardial effusion which is stable from prior.  She has bilateral minimal pleural effusion stable from prior.  Assessment/Plan:  Patient workup notable for hypokalemia which was replaced here.  This is likely secondary to her diuretic usage.  She had an elevated D-dimer which led [place] to get the CTA chest study.  It was negative for PE but does show some bilateral groundglass opacities which seem to been present on prior studies.  With the associated lymphadenopathy, will start her on Augmentin to treat for atypical pneumonia, however with elevated BNP, elevated troponins that are flat, I think she may be having a CHF element to her presentation. She does take torsemide at home, but not daily. She hasn't taken this in two days. I have given her a dose of lasix in the ED. I have urged importance of taking torsemide as prescribed. Also have asked her to f/u with cardiologist, Dr. Jacinto Halim.   Her vitals have remained stable here.  She is not hypoxic.  She is in no respiratory distress.  Her troponins are stable and flat. ACS less likely based on this information and patient presentation. I do not feel she requires admission at this  time. Recommend outpatient follow up  Charting Requirements Additional history is obtained from:  Independent historian External Records from outside source obtained and reviewed including: Recent cardiology note yesterday, recent pulmonology note, prior CTA chest imaging prior chest x-rays, recent echo results Social Determinants of Health:  none Pertinant PMH that complicates patient's illness: HFrEF, sarcoidosis, status post ICD  Patient  Care Problems that were addressed during this visit: - Chest TIghtness: Acute illness with systemic symptoms This patient was maintained on a cardiac monitor/telemetry. I personally viewed and interpreted the cardiac monitor which reveals an underlying rhythm of atrial paced rhythm Medications given in ED: Tessalon, Kcl 40 mEq, 40 mg Lasix, Augmentin Reevaluation of the patient after these medicines showed that the patient improved I have reviewed home medications and made changes accordingly.  Critical Care Interventions: n/a Consultations: n/a Disposition: discharge  This is a supervised visit with my attending physician, Dr. Wallace Cullens. We have discussed this patient and they have altered the plan as needed.  Portions of this note were generated with Scientist, clinical (histocompatibility and immunogenetics). Dictation errors may occur despite best attempts at proofreading.     Final Clinical Impression(s) / ED Diagnoses Final diagnoses:  Chest tightness    Rx / DC Orders ED Discharge Orders          Ordered    amoxicillin-clavulanate (AUGMENTIN) 875-125 MG tablet  Every 12 hours        09/30/22 1219              Claudie Leach, PA-C 09/30/22 1236    Edwin Dada P, DO 10/04/22 [phone]

## 2022-09-30 NOTE — Discharge Instructions (Signed)
You were seen in the ED for chest tightness. We saw some findings of pulmonary edema or pneumonia on your CT scan. We gave you a dose of lasix here. Please take Torsemide at home as prescribed. I have also prescribed you a course of antibiotics. Please call your cardiologist to schedule a follow up regarding your symptoms.

## 2022-09-30 NOTE — ED Notes (Signed)
ED Provider at bedside. 

## 2022-09-30 NOTE — ED Triage Notes (Signed)
States has been having tightness to mid chest and she feels like she needs to clear her throat when she is doing strenuous activity. Last night while having relations , she "got worked up" and coughed up blood in her sputum. Clearing her throat during triage several times. Saw Cards yesterday for recheck for CHF/Pacemaker check

## 2022-10-02 NOTE — Addendum Note (Signed)
Addended by: Anselm Pancoast on: 10/02/2022 03:57 PM   Modules accepted: Orders

## 2022-10-31 ENCOUNTER — Telehealth: Payer: Self-pay

## 2022-10-31 NOTE — Telephone Encounter (Signed)
Patient called and sad she has been having palpitations/fluttering all weekend and today and wanted your input/thoughts

## 2022-11-01 NOTE — Telephone Encounter (Signed)
She can come in to be seen in 1-2 days maybe Friday

## 2022-11-01 NOTE — Telephone Encounter (Signed)
Pt states that with her new job it is hard for her to take off but the palps are starting to scare her so she is going to try to get an EKG at her job the next time it happens and send it over. She also has an appt with her rheumatologist today @ 4 and will have labs done

## 2022-11-02 ENCOUNTER — Encounter: Payer: Self-pay | Admitting: Cardiology

## 2022-11-05 NOTE — Progress Notes (Signed)
EKG 11/01/2022: Atrially paced and biventricular paced rhythm at rate of 88 bpm.

## 2022-11-22 ENCOUNTER — Other Ambulatory Visit: Payer: Self-pay

## 2022-11-22 DIAGNOSIS — I5022 Chronic systolic (congestive) heart failure: Secondary | ICD-10-CM

## 2022-11-22 MED ORDER — VALSARTAN 40 MG PO TABS: 40.0000 mg | ORAL_TABLET | Freq: Every evening | ORAL | 1 refills | Status: DC

## 2022-11-28 ENCOUNTER — Other Ambulatory Visit: Payer: Self-pay | Admitting: Cardiology

## 2022-11-28 DIAGNOSIS — I5022 Chronic systolic (congestive) heart failure: Secondary | ICD-10-CM

## 2023-01-04 ENCOUNTER — Other Ambulatory Visit: Payer: Medicaid Other

## 2023-01-11 ENCOUNTER — Ambulatory Visit: Payer: Medicaid Other | Admitting: Cardiology

## 2023-01-17 NOTE — Progress Notes (Signed)
Chief Complaint  Patient presents with   ICD check   Encounter for adjustment of biventricular implantable cardioverter-defibrillator (ICD)  ICD: [employer] Bi-V ICD (Dynagen X4 CRT-D) MRI compatible ICD in situ  Chronic HFrEF (heart failure with reduced ejection fraction) ([place]) - Plan: PCV ECHOCARDIOGRAM COMPLETE  Nonischemic cardiomyopathy ([place]) - Plan: PCV ECHOCARDIOGRAM COMPLETE  Scheduled  In office ICD 01/23/23  Single (S)/Dual (D)/BV (M) BV Presenting ASBP Pacer dependant: Yes . Underlying 2:1 block A80 v 45/min. AP <1%,  BP 98.% AMS Episodes 1.  AT/AF burden <1%.Brief AT. HVR 0  Longevity 3 Years/Voltage.  Lead measurements: Stable Histogram: Low (L)/normal (N)/high (H)  Normal Patient activity Good. Thoracic impedance: NA  Observations: Normal BV ICD function.  Changes: None

## 2023-01-23 ENCOUNTER — Ambulatory Visit: Payer: Medicaid Other | Admitting: Cardiology

## 2023-01-23 DIAGNOSIS — Z4502 Encounter for adjustment and management of automatic implantable cardiac defibrillator: Secondary | ICD-10-CM

## 2023-01-23 DIAGNOSIS — I5022 Chronic systolic (congestive) heart failure: Secondary | ICD-10-CM

## 2023-01-23 DIAGNOSIS — I428 Other cardiomyopathies: Secondary | ICD-10-CM

## 2023-01-23 DIAGNOSIS — Z95810 Presence of automatic (implantable) cardiac defibrillator: Secondary | ICD-10-CM

## 2023-01-24 ENCOUNTER — Encounter: Payer: Medicaid Other | Admitting: Cardiology

## 2023-02-01 ENCOUNTER — Other Ambulatory Visit: Payer: Self-pay

## 2023-02-01 ENCOUNTER — Emergency Department (HOSPITAL_BASED_OUTPATIENT_CLINIC_OR_DEPARTMENT_OTHER): Payer: Medicaid Other

## 2023-02-01 ENCOUNTER — Encounter (HOSPITAL_BASED_OUTPATIENT_CLINIC_OR_DEPARTMENT_OTHER): Payer: Self-pay

## 2023-02-01 ENCOUNTER — Emergency Department (HOSPITAL_BASED_OUTPATIENT_CLINIC_OR_DEPARTMENT_OTHER)
Admission: EM | Admit: 2023-02-01 | Discharge: 2023-02-01 | Disposition: A | Discharge status: Home / Self Care | Payer: Medicaid Other | Attending: Emergency Medicine | Admitting: Emergency Medicine

## 2023-02-01 DIAGNOSIS — E119 Type 2 diabetes mellitus without complications: Secondary | ICD-10-CM | POA: Insufficient documentation

## 2023-02-01 DIAGNOSIS — R1013 Epigastric pain: Secondary | ICD-10-CM | POA: Diagnosis present

## 2023-02-01 DIAGNOSIS — Z87891 Personal history of nicotine dependence: Secondary | ICD-10-CM | POA: Diagnosis not present

## 2023-02-01 DIAGNOSIS — I110 Hypertensive heart disease with heart failure: Secondary | ICD-10-CM | POA: Diagnosis not present

## 2023-02-01 DIAGNOSIS — R109 Unspecified abdominal pain: Secondary | ICD-10-CM

## 2023-02-01 DIAGNOSIS — D72829 Elevated white blood cell count, unspecified: Secondary | ICD-10-CM | POA: Insufficient documentation

## 2023-02-01 DIAGNOSIS — Z79899 Other long term (current) drug therapy: Secondary | ICD-10-CM | POA: Diagnosis not present

## 2023-02-01 DIAGNOSIS — E039 Hypothyroidism, unspecified: Secondary | ICD-10-CM | POA: Insufficient documentation

## 2023-02-01 DIAGNOSIS — I509 Heart failure, unspecified: Secondary | ICD-10-CM | POA: Insufficient documentation

## 2023-02-01 LAB — CBC WITH DIFFERENTIAL/PLATELET
Abs Immature Granulocytes: 0.03 10*3/uL (ref 0.00–0.07)
Basophils Absolute: 0 10*3/uL (ref 0.0–0.1)
Basophils Relative: 0 %
Eosinophils Absolute: 0 10*3/uL (ref 0.0–0.5)
Eosinophils Relative: 0 %
HCT: 40.3 % (ref 36.0–46.0)
Hemoglobin: 12.9 g/dL (ref 12.0–15.0)
Immature Granulocytes: 0 %
Lymphocytes Relative: 6 %
Lymphs Abs: 0.7 10*3/uL (ref 0.7–4.0)
MCH: 29.3 pg (ref 26.0–34.0)
MCHC: 32 g/dL (ref 30.0–36.0)
MCV: 91.6 fL (ref 80.0–100.0)
Monocytes Absolute: 0.8 10*3/uL (ref 0.1–1.0)
Monocytes Relative: 7 %
Neutro Abs: 9.5 10*3/uL — ABNORMAL HIGH (ref 1.7–7.7)
Neutrophils Relative %: 87 %
Platelets: 161 10*3/uL (ref 150–400)
RBC: 4.4 MIL/uL (ref 3.87–5.11)
RDW: 13.3 % (ref 11.5–15.5)
WBC: 11 10*3/uL — ABNORMAL HIGH (ref 4.0–10.5)
nRBC: 0 % (ref 0.0–0.2)

## 2023-02-01 LAB — COMPREHENSIVE METABOLIC PANEL
ALT: 20 U/L (ref 0–44)
AST: 22 U/L (ref 15–41)
Albumin: 3.6 g/dL (ref 3.5–5.0)
Alkaline Phosphatase: 101 U/L (ref 38–126)
Anion gap: 9 (ref 5–15)
BUN: 14 mg/dL (ref 6–20)
CO2: 27 mmol/L (ref 22–32)
Calcium: 8.7 mg/dL — ABNORMAL LOW (ref 8.9–10.3)
Chloride: 100 mmol/L (ref 98–111)
Creatinine, Ser: 0.8 mg/dL (ref 0.44–1.00)
GFR, Estimated: >60 mL/min (ref >60)
Glucose, Bld: 115 mg/dL — ABNORMAL HIGH (ref 70–99)
Potassium: 3.7 mmol/L (ref 3.5–5.1)
Sodium: 136 mmol/L (ref 135–145)
Total Bilirubin: 0.7 mg/dL (ref 0.3–1.2)
Total Protein: 7.2 g/dL (ref 6.5–8.1)

## 2023-02-01 LAB — URINALYSIS, ROUTINE W REFLEX MICROSCOPIC
Bilirubin Urine: NEGATIVE
Glucose, UA: >=500 mg/dL — AB
Hgb urine dipstick: NEGATIVE
Ketones, ur: NEGATIVE mg/dL
Leukocytes,Ua: NEGATIVE
Nitrite: NEGATIVE
Protein, ur: NEGATIVE mg/dL
Specific Gravity, Urine: 1.01 (ref 1.005–1.030)
pH: 6 (ref 5.0–8.0)

## 2023-02-01 LAB — URINALYSIS, MICROSCOPIC (REFLEX)

## 2023-02-01 LAB — PREGNANCY, URINE: Preg Test, Ur: NEGATIVE

## 2023-02-01 LAB — LIPASE, BLOOD: Lipase: 34 U/L (ref 11–51)

## 2023-02-01 MED ORDER — CEPHALEXIN 500 MG PO CAPS: 500.0000 mg | ORAL_CAPSULE | Freq: Four times a day (QID) | ORAL | 0 refills | Status: DC

## 2023-02-01 MED ORDER — IOHEXOL 300 MG/ML  SOLN
100.0000 mL | Freq: Once | INTRAMUSCULAR | Status: AC | PRN
  Administered 2023-02-01: 100 mL via INTRAVENOUS

## 2023-02-01 MED ORDER — KETOROLAC TROMETHAMINE 15 MG/ML IJ SOLN
15.0000 mg | Freq: Once | INTRAMUSCULAR | Status: AC
  Administered 2023-02-01: 15 mg via INTRAVENOUS
  Filled 2023-02-01: qty 1

## 2023-02-01 MED ORDER — IBUPROFEN 600 MG PO TABS: 600.0000 mg | ORAL_TABLET | Freq: Four times a day (QID) | ORAL | 0 refills | Status: DC | PRN

## 2023-02-01 NOTE — ED Provider Notes (Signed)
[place] Provider Note   CSN: [id number] Arrival date & time: 02/01/23  [id number]     History  Chief Complaint  Patient presents with   Flank Pain    Alicia Cook is a 57 y.o. female.   Flank Pain   57 year old female presents emergency department with complaints of left-sided flank pain.  Patient states that symptoms been present for the past 2 weeks.  There is unaware of what she was doing when symptoms began.  States that she is having some associated epigastric discomfort.  Was seen in urgent care 2 to 3 days ago and told to come the emergency department if symptoms do not improve.  Patient also concerned about possible constipation.  States that she has had very small liquid stools occurring sporadically every 2 to 3 days with most recent bowel movement 2 days ago.  States that she usually takes stool softener but recently stopped taking medicine 2 to 3 weeks ago and has had difficulty with regular bowel movements since then.  Denies chest pain, shortness of breath, fever, chills, night sweats, nausea, vomiting, urinary/vaginal symptoms, hematochezia/melena.  Past surgical history concerning for gastric sleeve.  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 Authorizing Provider  cephALEXin (KEFLEX) 500 MG capsule Take 1 capsule (500 mg total) by mouth 4 (four) times daily. 02/01/23  Yes Dion Saucier A, PA  ibuprofen (ADVIL) 600 MG tablet Take 1 tablet (600 mg total) by mouth every 6 (six) hours as needed. 02/01/23  Yes Dion Saucier A, PA  BIOTIN  PO Take 1 capsule by mouth daily with lunch.    [provider]  busPIRone (BUSPAR) 15 MG tablet Take 15 mg by mouth 2 (two) times daily. 03/24/22   [provider]  CALCIUM PO Take 1 tablet by mouth 2 (two) times daily. chewable    [provider]  carvedilol (COREG) 6.25 MG tablet TAKE 1 TABLET BY MOUTH TWICE A DAY 08/07/22   Adrian Prows, MD  digoxin (LANOXIN) 0.125 MG tablet Take 1 tablet (0.125 mg total) by mouth daily. 07/28/21   Cantwell, Celeste C, PA-C  Ferrous Sulfate (IRON PO) Take 1 tablet by mouth daily.    [provider]  folic acid (FOLVITE) 1 MG tablet Take 3 mg by mouth daily with lunch.    [provider]  hydrOXYzine (VISTARIL) 25 MG capsule Take 25 mg by mouth 2 (two) times daily as needed for anxiety. 11/26/20   [provider]  magnesium hydroxide (MILK OF MAGNESIA) 400 MG/5ML suspension Take 30 mLs by mouth daily as needed for mild constipation.    [provider]  MAGnesium-Oxide 400 (240 Mg) MG tablet Take 1 tablet (400 mg total) by mouth daily. 05/15/22   Adrian Prows, MD  meloxicam (MOBIC) 7.5 MG tablet Take 1 tablet (7.5 mg total) by mouth daily. 02/25/22   Carlisle Cater, PA-C  methocarbamol (ROBAXIN) 500 MG tablet Take 1 tablet (500 mg total) by mouth 2 (two) times daily. 02/25/22   Carlisle Cater, PA-C  methotrexate (RHEUMATREX) 2.5 MG tablet Take 20 mg by mouth every Sunday. Caution:Chemotherapy. Protect from light.    [provider]  midodrine (PROAMATINE) 10 MG  tablet Take 1 tablet (10 mg total) by mouth 3 (three) times daily with meals as needed. 04/18/22   Adrian Prows, MD  Multiple Vitamin (MULTIVITAMIN WITH MINERALS) TABS tablet Take 1 tablet by mouth daily with lunch.    [provider]  pantoprazole (PROTONIX) 40 MG tablet Take 40 mg by mouth daily as needed (acid reflux). 01/23/20   [provider]  Potassium Chloride ER 20 MEQ TBCR Take 20 mEq by mouth daily as needed. Take when you  take demadex 07/19/21   Eugenie Filler, MD  Sertraline HCl 200 MG CAPS Take 200 mg by mouth daily with lunch.    [provider]  spironolactone (ALDACTONE) 25 MG tablet Take 1 tablet (25 mg total) by mouth daily. 04/03/22 03/29/23  Cantwell, Celeste C, PA-C  tirzepatide [facility]) 10 MG/0.5ML Pen Inject 10 mg into the skin once a week. 08/01/22   [provider]  torsemide (DEMADEX) 20 MG tablet Take 1 tablet (20 mg total) by mouth daily as needed. 08/03/22 07/29/23  Ernst Spell, NP  valsartan (DIOVAN) 40 MG tablet TAKE 1 TABLET BY MOUTH EVERY DAY IN THE EVENING 11/28/22   Adrian Prows, MD  vitamin B-12 (CYANOCOBALAMIN) 500 MCG tablet Take 500 mcg by mouth daily with lunch.    [provider]  Vitamin D, Ergocalciferol, (DRISDOL) 1.25 MG (50000 UT) CAPS capsule Take 50,000 Units by mouth every Tuesday. 09/20/18   [provider]      Allergies    Patient has no known allergies.    Review of Systems   Review of Systems  Genitourinary:  Positive for flank pain.  All other systems reviewed and are negative.   Physical Exam Updated Vital Signs BP 93/70   Pulse (!) 112   Temp 98.6 F (37 C) (Oral)   Resp 18   Ht 5\' 7"  (1.702 m)   Wt 93.9 kg   LMP 09/14/1991 (Within Weeks)   SpO2 100%   BMI 32.42 kg/m  Physical Exam Vitals and nursing note reviewed. Exam conducted with a chaperone present.  Constitutional:      General: She is not in acute distress.    Appearance: She is well-developed.  HENT:     Head: Normocephalic and atraumatic.  Eyes:     Conjunctiva/sclera: Conjunctivae normal.  Cardiovascular:     Rate and Rhythm: Normal rate and regular rhythm.  Pulmonary:     Effort: Pulmonary effort is normal. No respiratory distress.     Breath sounds: Normal breath sounds. No wheezing, rhonchi or rales.  Abdominal:     Palpations: Abdomen is soft.     Tenderness: There is abdominal tenderness. There is left CVA tenderness. There is no right  CVA tenderness.     Comments: Epigastric tenderness to palpation.  Patient reports feeling left flank pain with palpation of epigastric abdomen.  Genitourinary:    Comments: Patient without left labial or perineal tenderness or overlying skin changes including erythema, palpable fluctuance, palpable induration.  No rash appreciated. Musculoskeletal:        General: No swelling.     Cervical back: Neck supple.     Right lower leg: No edema.     Left lower leg: No edema.  Skin:    General: Skin is warm and dry.     Capillary Refill: Capillary refill takes less than 2 seconds.  Neurological:     Mental Status: She is alert.  Psychiatric:        Mood and Affect:  Mood normal.     ED Results / Procedures / Treatments   Labs (all labs ordered are listed, but only abnormal results are displayed) Labs Reviewed  COMPREHENSIVE METABOLIC PANEL - Abnormal; Notable for the following components:      Result Value   Glucose, Bld 115 (*)    Calcium 8.7 (*)    All other components within normal limits  CBC WITH DIFFERENTIAL/PLATELET - Abnormal; Notable for the following components:   WBC 11.0 (*)    Neutro Abs 9.5 (*)    All other components within normal limits  URINALYSIS, ROUTINE W REFLEX MICROSCOPIC - Abnormal; Notable for the following components:   Glucose, UA >=500 (*)    All other components within normal limits  URINALYSIS, MICROSCOPIC (REFLEX) - Abnormal; Notable for the following components:   Bacteria, UA RARE (*)    All other components within normal limits  LIPASE, BLOOD  PREGNANCY, URINE    EKG None  Radiology CT Abdomen Pelvis W Contrast  Result Date: 02/01/2023 CLINICAL DATA:  Left-sided abdominal pain associated with several week history constipation EXAM: CT ABDOMEN AND PELVIS WITH CONTRAST TECHNIQUE: Multidetector CT imaging of the abdomen and pelvis was performed using the standard protocol following bolus administration of intravenous contrast. RADIATION DOSE  REDUCTION: This exam was performed according to the departmental dose-optimization program which includes automated exposure control, adjustment of the mA and/or kV according to patient size and/or use of iterative reconstruction technique. CONTRAST:  170mL OMNIPAQUE IOHEXOL 300 MG/ML  SOLN COMPARISON:  CT abdomen and pelvis dated 08/24/2021 FINDINGS: Lower chest: Partially imaged ICD leads terminate in the right atrium right ventricle, and coronary sinus. Left lower lobe subsegmental atelectasis. Calcified plaque along the right lower lateral lung (4:20), similar to 08/24/2021. No pleural effusion or pneumothorax demonstrated. Multichamber cardiomegaly. Small pericardial effusion. Hepatobiliary: Subcentimeter segment 3 hypodensity, small to characterize but unchanged from 08/24/2021. No intra or extrahepatic biliary ductal dilation. Cholecystectomy. Pancreas: No focal lesions or main ductal dilation. Spleen: Normal in size without focal abnormality. Adrenals/Urinary Tract: No adrenal nodules. Unchanged subcentimeter hypoattenuating focus along the lateral lower pole left kidney, too small to characterize likely cyst. No specific follow-up imaging recommended. No suspicious renal lesions, hydronephrosis, or calculi. No focal bladder wall thickening. Stomach/Bowel: Similar postsurgical changes of the stomach. No evidence of bowel wall thickening, distention, or inflammatory changes. Small volume stool within colon. Normal appendix. Vascular/Lymphatic: Aortic atherosclerosis. No enlarged abdominal or pelvic lymph nodes. Reproductive: No adnexal masses. Other: Trace pelvic free fluid.  No free air or fluid collection. Musculoskeletal: No acute or abnormal lytic or blastic osseous lesions. Multilevel degenerative changes of the partially imaged thoracic and lumbar spine. Asymmetric soft tissue thickening of the left labial/perineal region (2:103). IMPRESSION: 1. No acute intra-abdominal or intrapelvic abnormality. 2.  Small volume stool within the colon. 3. Asymmetric soft tissue thickening of the left labial/perineal region, which would be amenable to direct inspection. 4.  Aortic Atherosclerosis (ICD10-I70.0). Electronically Signed   By: Darrin Nipper M.D.   On: 02/01/2023 11:56    Procedures Procedures    Medications Ordered in ED Medications  ketorolac (TORADOL) 15 MG/ML injection 15 mg (15 mg Intravenous Given 02/01/23 0944)  iohexol (OMNIPAQUE) 300 MG/ML solution 100 mL (100 mLs Intravenous Contrast Given 02/01/23 1118)    ED Course/ Medical Decision Making/ A&P                             Medical Decision  Making Amount and/or Complexity of Data Reviewed Labs: ordered. Radiology: ordered.  Risk Prescription drug management.   This patient presents to the ED for concern of left flank pain, this involves an extensive number of treatment options, and is a complaint that carries with it a high risk of complications and morbidity.  The differential diagnosis includes gastritis/PUD, pancreatitis, CBD pathology, cholecystitis, hepatitis, SBO/LBO, volvulus, appendicitis, diverticulitis, pyelonephritis, nephrolithiasis, cystitis, ectopic pregnancy, ovarian torsion, tubo-ovarian abscess, PID, mesenteric ischemia, AAA, aortic dissection.   Co morbidities that complicate the patient evaluation  See HPI   Additional history obtained:  Additional history obtained from EMR External records from outside source obtained and reviewed including hospital records   Lab Tests:  I Ordered, and personally interpreted labs.  The pertinent results include: Mild leukocytosis of 11.  No evidence of anemia.  Platelets within normal range.  Mild hypocalcemia of 8.7 but otherwise, electrolytes within normal range.  No transaminitis.  No renal dysfunction.  UA significant for rare bacteria and greater than 500 glucose but otherwise unremarkable.  Lipase within normal limits.  Urine pregnancy negative.   Imaging Studies  ordered:  I ordered imaging studies including CT abdomen pelvis I independently visualized and interpreted imaging which showed no acute intra-abdominal process.  Asymmetric soft tissue thickening of left labial/perineal region.  Aortic atherosclerosis. I agree with the radiologist interpretation  Cardiac Monitoring: / EKG:  The patient was maintained on a cardiac monitor.  I personally viewed and interpreted the cardiac monitored which showed an underlying rhythm of: Sinus rhythm   Consultations Obtained:  N/a   Problem List / ED Course / Critical interventions / Medication management  Left flank pain I ordered medication including Toradol   Reevaluation of the patient after these medicines showed that the patient improved I have reviewed the patients home medicines and have made adjustments as needed   Social Determinants of Health:  Former cigarette use.  Denies illicit drug use.   Test / Admission - Considered:  Left flank pain Vitals signs within normal range and stable throughout visit. Laboratory/imaging studies significant for: See above 57 year old female presents emergency department with complaints of left-sided flank pain.  Patient workup today overall reassuring.  No evidence of pyelonephritis, nephrolithiasis, overlying skin abnormalities indicative of HZV.  Patient CT scan without any acute abnormalities.  Patient symptoms could be musculoskeletal given resolution with administration of Toradol.  Will recommend continued use of NSAIDs at home for treatment of left-sided pain.  Patient overall well-appearing, afebrile in no acute distress.  Patient with some complaints of foul-smelling urine and urinary frequency upon further review; given bacteria in urine, will treat empirically for urinary tract infection.  Patient recommended follow-up with primary care for reassessment of symptoms.  Treatment plan discussed at length with patient and she acknowledged understanding  was agreeable to said plan. Worrisome signs and symptoms were discussed with the patient, and the patient acknowledged understanding to return to the ED if noticed. Patient was stable upon discharge.          Final Clinical Impression(s) / ED Diagnoses Final diagnoses:  Left flank pain    Rx / DC Orders ED Discharge Orders          Ordered    cephALEXin (KEFLEX) 500 MG capsule  4 times daily        02/01/23 1251    ibuprofen (ADVIL) 600 MG tablet  Every 6 hours PRN        02/01/23 1253  Peter Garter, [place] 02/01/23 1305    Blane Ohara, MD 02/03/23 2300

## 2023-02-01 NOTE — ED Triage Notes (Addendum)
Pt reports left sided pain, possible constipation for couple weeks . Worse with breathing and moving  Unsure of last BM. Pt has been taking OTC for bowels. Only having liquid stool. Reports skin is sore this morning and HA

## 2023-02-01 NOTE — ED Notes (Signed)
Pt denies NVD and dysuria. Pt complains of constipation, unknown last BM states she has no relief from taking dulcolax.

## 2023-02-01 NOTE — Discharge Instructions (Signed)
As discussed, workup today overall reassuring.  No evidence of any acute abnormalities appreciated on the CT scan of the abdomen or pelvis.  Continue take nonsteroidal medication as discussed for your pain/discomfort.  Recommend follow-up with your primary care for reassessment of your symptoms.  Please not hesitate to return to emergency department for worrisome signs and symptoms we discussed become apparent.

## 2023-02-01 NOTE — ED Notes (Signed)
PA reviewed discharge instructions and medications with pt. Pt discharged at this time

## 2023-02-05 ENCOUNTER — Encounter (HOSPITAL_BASED_OUTPATIENT_CLINIC_OR_DEPARTMENT_OTHER): Payer: Self-pay

## 2023-02-05 ENCOUNTER — Emergency Department (HOSPITAL_BASED_OUTPATIENT_CLINIC_OR_DEPARTMENT_OTHER): Payer: Medicaid Other

## 2023-02-05 ENCOUNTER — Emergency Department (HOSPITAL_BASED_OUTPATIENT_CLINIC_OR_DEPARTMENT_OTHER)
Admission: EM | Admit: 2023-02-05 | Discharge: 2023-02-05 | Disposition: A | Discharge status: Home / Self Care | Payer: Medicaid Other | Attending: Emergency Medicine | Admitting: Emergency Medicine

## 2023-02-05 ENCOUNTER — Other Ambulatory Visit: Payer: Self-pay

## 2023-02-05 DIAGNOSIS — R109 Unspecified abdominal pain: Secondary | ICD-10-CM | POA: Insufficient documentation

## 2023-02-05 DIAGNOSIS — K5900 Constipation, unspecified: Secondary | ICD-10-CM | POA: Diagnosis not present

## 2023-02-05 DIAGNOSIS — R6883 Chills (without fever): Secondary | ICD-10-CM | POA: Insufficient documentation

## 2023-02-05 DIAGNOSIS — Z79899 Other long term (current) drug therapy: Secondary | ICD-10-CM | POA: Insufficient documentation

## 2023-02-05 DIAGNOSIS — I509 Heart failure, unspecified: Secondary | ICD-10-CM | POA: Insufficient documentation

## 2023-02-05 DIAGNOSIS — I110 Hypertensive heart disease with heart failure: Secondary | ICD-10-CM | POA: Diagnosis not present

## 2023-02-05 DIAGNOSIS — M542 Cervicalgia: Secondary | ICD-10-CM | POA: Insufficient documentation

## 2023-02-05 LAB — COMPREHENSIVE METABOLIC PANEL
ALT: 24 U/L (ref 0–44)
AST: 25 U/L (ref 15–41)
Albumin: 3.3 g/dL — ABNORMAL LOW (ref 3.5–5.0)
Alkaline Phosphatase: 90 U/L (ref 38–126)
Anion gap: 7 (ref 5–15)
BUN: 17 mg/dL (ref 6–20)
CO2: 26 mmol/L (ref 22–32)
Calcium: 8.8 mg/dL — ABNORMAL LOW (ref 8.9–10.3)
Chloride: 102 mmol/L (ref 98–111)
Creatinine, Ser: 0.81 mg/dL (ref 0.44–1.00)
GFR, Estimated: >60 mL/min (ref >60)
Glucose, Bld: 92 mg/dL (ref 70–99)
Potassium: 4 mmol/L (ref 3.5–5.1)
Sodium: 135 mmol/L (ref 135–145)
Total Bilirubin: 0.3 mg/dL (ref 0.3–1.2)
Total Protein: 6.6 g/dL (ref 6.5–8.1)

## 2023-02-05 LAB — BRAIN NATRIURETIC PEPTIDE: B Natriuretic Peptide: 429.6 pg/mL — ABNORMAL HIGH (ref 0.0–100.0)

## 2023-02-05 LAB — CBC WITH DIFFERENTIAL/PLATELET
Abs Immature Granulocytes: 0.03 10*3/uL (ref 0.00–0.07)
Basophils Absolute: 0 10*3/uL (ref 0.0–0.1)
Basophils Relative: 0 %
Eosinophils Absolute: 0.1 10*3/uL (ref 0.0–0.5)
Eosinophils Relative: 1 %
HCT: 34.7 % — ABNORMAL LOW (ref 36.0–46.0)
Hemoglobin: 11.4 g/dL — ABNORMAL LOW (ref 12.0–15.0)
Immature Granulocytes: 0 %
Lymphocytes Relative: 14 %
Lymphs Abs: 1.5 10*3/uL (ref 0.7–4.0)
MCH: 29.5 pg (ref 26.0–34.0)
MCHC: 32.9 g/dL (ref 30.0–36.0)
MCV: 89.9 fL (ref 80.0–100.0)
Monocytes Absolute: 1 10*3/uL (ref 0.1–1.0)
Monocytes Relative: 10 %
Neutro Abs: 7.9 10*3/uL — ABNORMAL HIGH (ref 1.7–7.7)
Neutrophils Relative %: 75 %
Platelets: 219 10*3/uL (ref 150–400)
RBC: 3.86 MIL/uL — ABNORMAL LOW (ref 3.87–5.11)
RDW: 13.2 % (ref 11.5–15.5)
WBC: 10.6 10*3/uL — ABNORMAL HIGH (ref 4.0–10.5)
nRBC: 0 % (ref 0.0–0.2)

## 2023-02-05 LAB — TROPONIN I (HIGH SENSITIVITY): Troponin I (High Sensitivity): 11 ng/L (ref <18)

## 2023-02-05 LAB — LIPASE, BLOOD: Lipase: 40 U/L (ref 11–51)

## 2023-02-05 MED ORDER — SODIUM CHLORIDE 0.9 % IV BOLUS
1000.0000 mL | Freq: Once | INTRAVENOUS | Status: AC
  Administered 2023-02-05: 1000 mL via INTRAVENOUS

## 2023-02-05 MED ORDER — DICYCLOMINE HCL 10 MG PO CAPS
10.0000 mg | ORAL_CAPSULE | Freq: Once | ORAL | Status: AC
  Administered 2023-02-05: 10 mg via ORAL
  Filled 2023-02-05: qty 1

## 2023-02-05 MED ORDER — DICYCLOMINE HCL 20 MG PO TABS: 20.0000 mg | ORAL_TABLET | Freq: Two times a day (BID) | ORAL | 0 refills | Status: DC | PRN

## 2023-02-05 MED ORDER — SUCRALFATE 1 G PO TABS: 1.0000 g | ORAL_TABLET | Freq: Three times a day (TID) | ORAL | 0 refills | Status: DC

## 2023-02-05 MED ORDER — FAMOTIDINE IN NACL 20-0.9 MG/50ML-% IV SOLN
20.0000 mg | Freq: Once | INTRAVENOUS | Status: AC
  Administered 2023-02-05: 20 mg via INTRAVENOUS
  Filled 2023-02-05: qty 50

## 2023-02-05 MED ORDER — ONDANSETRON HCL 4 MG/2ML IJ SOLN
4.0000 mg | Freq: Once | INTRAMUSCULAR | Status: AC
  Administered 2023-02-05: 4 mg via INTRAVENOUS
  Filled 2023-02-05: qty 2

## 2023-02-05 NOTE — ED Provider Notes (Signed)
[place] [facility] Provider Note   CSN: [id number] Arrival date & time: 02/05/23  2137     History  Chief Complaint  Patient presents with   Weakness   Constipation    Alicia Cook is a 57 y.o. female history of CHF, hypertension, constipation here presenting with persistent abdominal cramps as well as pain to the left neck.  Patient was seen here 2 days ago for abdominal distention and flank pain.  She had CT abdomen pelvis that showed constipation.  Patient tried some enema and then had a bowel movement and then had worsening cramps.  She states that she has some pain that radiates up to the neck today.  Also some chills.  Patient states that she has not seen her GI doctor for a while has not had further workup other than CT abdomen pelvis several days ago.  The history is provided by the patient.       Home Medications Prior to Admission medications   Medication Sig Start Date End Date Taking? Authorizing Provider  BIOTIN PO Take 1 capsule by mouth daily with lunch.    [provider]  busPIRone (BUSPAR) 15 MG tablet Take 15 mg by mouth 2 (two) times daily. 03/24/22   [provider]  CALCIUM PO Take 1 tablet by mouth 2 (two) times daily. chewable    [provider]  carvedilol (COREG) 6.25 MG tablet TAKE 1 TABLET BY MOUTH TWICE A DAY 08/07/22   Yates Decamp, MD  cephALEXin (KEFLEX) 500 MG capsule Take 1 capsule (500 mg total) by mouth 4 (four) times daily. 02/01/23   Peter Garter, PA  digoxin (LANOXIN) 0.125 MG tablet Take 1 tablet (0.125 mg total) by mouth daily. 07/28/21   Cantwell, Celeste C, PA-C  Ferrous Sulfate (IRON PO) Take 1 tablet by mouth daily.    [provider]  folic acid (FOLVITE) 1 MG tablet Take 3 mg by mouth daily with lunch.    [provider]  hydrOXYzine (VISTARIL) 25 MG capsule Take 25 mg by mouth 2 (two) times daily as needed for anxiety. 11/26/20   [provider]   ibuprofen (ADVIL) 600 MG tablet Take 1 tablet (600 mg total) by mouth every 6 (six) hours as needed. 02/01/23   Peter Garter, PA  magnesium hydroxide (MILK OF MAGNESIA) 400 MG/5ML suspension Take 30 mLs by mouth daily as needed for mild constipation.    [provider]  MAGnesium-Oxide 400 (240 Mg) MG tablet Take 1 tablet (400 mg total) by mouth daily. 05/15/22   Yates Decamp, MD  meloxicam (MOBIC) 7.5 MG tablet Take 1 tablet (7.5 mg total) by mouth daily. 02/25/22   Renne Crigler, PA-C  methocarbamol (ROBAXIN) 500 MG tablet Take 1 tablet (500 mg total) by mouth 2 (two) times daily. 02/25/22   Renne Crigler, PA-C  methotrexate (RHEUMATREX) 2.5 MG tablet Take 20 mg by mouth every Sunday. Caution:Chemotherapy. Protect from light.    [provider]  midodrine (PROAMATINE) 10 MG tablet Take 1 tablet (10 mg total) by mouth 3 (three) times daily with meals as needed. 04/18/22   Yates Decamp, MD  Multiple Vitamin (MULTIVITAMIN WITH MINERALS) TABS tablet Take 1 tablet by mouth daily with lunch.    [provider]  pantoprazole (PROTONIX) 40 MG tablet Take 40 mg by mouth daily as needed (acid reflux). 01/23/20   [provider]  Potassium Chloride ER 20 MEQ TBCR Take 20 mEq by mouth daily  as needed. Take when you take demadex 07/19/21   Rodolph Bonghompson, Daniel V, MD  Sertraline HCl 200 MG CAPS Take 200 mg by mouth daily with lunch.    [provider]  spironolactone (ALDACTONE) 25 MG tablet Take 1 tablet (25 mg total) by mouth daily. 04/03/22 03/29/23  Cantwell, Celeste C, PA-C  tirzepatide [facility](MOUNJARO) 10 MG/0.5ML Pen Inject 10 mg into the skin once a week. 08/01/22   [provider]  torsemide (DEMADEX) 20 MG tablet Take 1 tablet (20 mg total) by mouth daily as needed. 08/03/22 07/29/23  Nori RiisStephenson, Brittany, NP  valsartan (DIOVAN) 40 MG tablet TAKE 1 TABLET BY MOUTH EVERY DAY IN THE EVENING 11/28/22   Yates DecampGanji, Jay, MD  vitamin B-12 (CYANOCOBALAMIN) 500 MCG tablet Take 500  mcg by mouth daily with lunch.    [provider]  Vitamin D, Ergocalciferol, (DRISDOL) 1.25 MG (50000 UT) CAPS capsule Take 50,000 Units by mouth every Tuesday. 09/20/18   [provider]      Allergies    Patient has no known allergies.    Review of Systems   Review of Systems  Gastrointestinal:  Positive for constipation.  Neurological:  Positive for weakness.  All other systems reviewed and are negative.   Physical Exam Updated Vital Signs BP 103/78 (BP Location: Right Arm)   Pulse (!) 106   Temp 99.9 F (37.7 C) (Oral)   Resp (!) 23   Ht 5\' 7"  (1.702 m)   Wt 93.9 kg   LMP 09/14/1991 (Within Weeks)   SpO2 100%   BMI 32.42 kg/m  Physical Exam Vitals and nursing note reviewed.  Constitutional:      Comments: Slightly uncomfortable  HENT:     Head: Normocephalic.     Nose: Nose normal.     Mouth/Throat:     Mouth: Mucous membranes are moist.  Eyes:     Extraocular Movements: Extraocular movements intact.     Pupils: Pupils are equal, round, and reactive to light.  Cardiovascular:     Rate and Rhythm: Normal rate and regular rhythm.     Pulses: Normal pulses.     Heart sounds: Normal heart sounds.  Pulmonary:     Effort: Pulmonary effort is normal.     Breath sounds: Normal breath sounds.  Abdominal:     General: Abdomen is flat.     Palpations: Abdomen is soft.     Comments: No abdominal tenderness  Musculoskeletal:        General: Normal range of motion.     Cervical back: Normal range of motion and neck supple.  Skin:    General: Skin is warm.     Capillary Refill: Capillary refill takes less than 2 seconds.  Neurological:     General: No focal deficit present.     Mental Status: She is alert and oriented to person, place, and time.  Psychiatric:        Mood and Affect: Mood normal.        Behavior: Behavior normal.     ED Results / Procedures / Treatments   Labs (all labs ordered are listed, but only abnormal results are  displayed) Labs Reviewed  CBC WITH DIFFERENTIAL/PLATELET - Abnormal; Notable for the following components:      Result Value   WBC 10.6 (*)    RBC 3.86 (*)    Hemoglobin 11.4 (*)    HCT 34.7 (*)    Neutro Abs 7.9 (*)    All other components within  normal limits  COMPREHENSIVE METABOLIC PANEL - Abnormal; Notable for the following components:   Calcium 8.8 (*)    Albumin 3.3 (*)    All other components within normal limits  BRAIN NATRIURETIC PEPTIDE - Abnormal; Notable for the following components:   B Natriuretic Peptide 429.6 (*)    All other components within normal limits  LIPASE, BLOOD  TROPONIN I (HIGH SENSITIVITY)    EKG EKG Interpretation  Date/Time:  Monday February 05 2023 22:01:23 EDT Ventricular Rate:  107 PR Interval:  135 QRS Duration: 139 QT Interval:  400 QTC Calculation: 534 R Axis:   -88 Text Interpretation: Sinus tachycardia Ventricular premature complex Probable left atrial enlargement Right bundle branch block Left ventricular hypertrophy Inferior infarct, acute Probable anterolateral infarct, recent No significant change since last tracing Confirmed by Richardean Canal [phone]) on 02/05/2023 10:40:29 PM  Radiology DG ABD ACUTE 2+V W 1V CHEST  Result Date: 02/05/2023 CLINICAL DATA:  Chest pain EXAM: DG ABDOMEN ACUTE WITH 1 VIEW CHEST COMPARISON:  Chest x-ray 09/30/2022 FINDINGS: Left-sided ICD present. The heart is enlarged, unchanged. There is no definite lung consolidation, pleural effusion or pneumothorax. Bowel-gas pattern is nonobstructive. There is moderate stool burden. Phleboliths are seen in the pelvis. There surgical clips in the upper abdomen. No acute fractures are seen. IMPRESSION: 1. No active disease in the chest. 2. Nonobstructive bowel gas pattern. Moderate stool burden. Electronically Signed   By: Darliss Cheney M.D.   On: 02/05/2023 22:35    Procedures Procedures    Medications Ordered in ED Medications  sodium chloride 0.9 % bolus 1,000 mL (1,000  mLs Intravenous New Bag/Given 02/05/23 2242)  ondansetron (ZOFRAN) injection 4 mg (4 mg Intravenous Given 02/05/23 2242)  famotidine (PEPCID) IVPB 20 mg premix (20 mg Intravenous New Bag/Given 02/05/23 2242)  dicyclomine (BENTYL) capsule 10 mg (10 mg Oral Given 02/05/23 2243)    ED Course/ Medical Decision Making/ A&P                             Medical Decision Making Tessa Laury is a 57 y.o. female here presenting with abdominal cramps and pain to the neck after enema.  I think she likely has gas versus persistent constipation.  Low suspicion for ACS or PE or dissection.  Plan to get CBC CMP and troponin x 1 and acute abdominal series  11:19 PM I reviewed patient's labs and they were unremarkable.  X-ray showed constipation.  I recommend that she takes Bentyl for cramps and continue her MiraLAX.  I also told her to follow-up with GI.   Problems Addressed: Abdominal cramping: acute illness or injury Constipation, unspecified constipation type: acute illness or injury  Amount and/or Complexity of Data Reviewed Labs: ordered. Decision-making details documented in ED Course. Radiology: ordered and independent interpretation performed. Decision-making details documented in ED Course. ECG/medicine tests: ordered.  Risk Prescription drug management.    Final Clinical Impression(s) / ED Diagnoses Final diagnoses:  None    Rx / DC Orders ED Discharge Orders     None         Charlynne Pander, MD 02/05/23 2320

## 2023-02-05 NOTE — Discharge Instructions (Signed)
You likely have reflux or gastric ulcers.  I recommend you continue your Demadex  I have added Bentyl as needed for cramps  I have also added sucralfate with meals and at night  Please follow-up with your GI doctor  Please continue your current bowel regiment for constipation  Return to ER if you have worse abdominal pain or vomiting or chest pain

## 2023-02-05 NOTE — ED Triage Notes (Signed)
Pt c/o being generalized weakness, aching, fever, abdominal distention, left flank pain, constipation and pressure in neck. Pt reports when she burps, she gets some relief but it comes back. Pt reports she was here for same 2 days ago.

## 2023-02-09 ENCOUNTER — Ambulatory Visit: Payer: Medicaid Other | Admitting: Cardiology

## 2023-02-09 DIAGNOSIS — I5022 Chronic systolic (congestive) heart failure: Secondary | ICD-10-CM

## 2023-02-09 DIAGNOSIS — Z4502 Encounter for adjustment and management of automatic implantable cardiac defibrillator: Secondary | ICD-10-CM

## 2023-02-09 DIAGNOSIS — Z95810 Presence of automatic (implantable) cardiac defibrillator: Secondary | ICD-10-CM

## 2023-02-09 NOTE — Progress Notes (Signed)
Chief Complaint  Patient presents with   Pacemaker Check    Feels uncomfortable at the pacemaker site and mild discomfort   Scheduled  In office pacemaker check 02/09/23  Single (S)/Dual (D)/BV (M) BV Presenting ASBP Pacer dependant: Yes . Underlying 2:1 block A80 v 45/min. AP <1%,  BP 98.% AMS Episodes 0.  AT/AF burden 0%. HVR 0  Longevity 3 Years/Voltage.  Lead measurements: Stable Histogram: Low (L)/normal (N)/high (H)  Normal Patient activity Good. Thoracic impedance: NA  Observations: Normal BV ICD function.  Changes: None  Her symptoms are related to the pacemaker movement from weight loss and now the pacemaker is literally able to be pinched and held from the side.  I am concerned that the device may flip over and may need stabilization to prevent lead fracture. For now all the parameters look good.   Will send consult request to Dr. Lewayne Bunting and get his opinion. D/W patient and she is agreeable   Yates Decamp, MD, [facility] 02/09/2023, 6:25 PM Office: [phone] Fax: [phone] Pager: [phone]

## 2023-02-27 ENCOUNTER — Other Ambulatory Visit: Payer: Self-pay

## 2023-02-27 MED ORDER — TORSEMIDE 20 MG PO TABS: 20.0000 mg | ORAL_TABLET | Freq: Every day | ORAL | 3 refills | Status: DC | PRN

## 2023-03-12 ENCOUNTER — Ambulatory Visit: Payer: Medicaid Other | Admitting: Cardiology

## 2023-03-12 ENCOUNTER — Telehealth: Payer: Self-pay | Admitting: Cardiology

## 2023-03-12 ENCOUNTER — Telehealth: Payer: Self-pay

## 2023-03-12 DIAGNOSIS — I3139 Other pericardial effusion (noninflammatory): Secondary | ICD-10-CM

## 2023-03-12 DIAGNOSIS — I5022 Chronic systolic (congestive) heart failure: Secondary | ICD-10-CM

## 2023-03-12 NOTE — Telephone Encounter (Signed)
High-resolution CT scan of the chest 03/02/2023, comparison 09/02/2021: 1. No evidence of interstitial lung disease.  2. Moderate pericardial effusion, enlarged from 09/02/2021.  3. Enlarged pulmonic trunk, indicative of pulmonary arterial hypertension.      ICD-10-CM   1. Pericardial effusion (noninflammatory)  I31.39 Pro b natriuretic peptide (BNP)    2. Chronic HFrEF (heart failure with reduced ejection fraction) (HCC)  I50.22 Pro b natriuretic peptide (BNP)     Patient has been scheduled for an echocardiogram, will try to get this done sooner and see her back after the echocardiogram.  Not emergent.  Will obtain baseline NT proBNP.

## 2023-03-12 NOTE — Telephone Encounter (Signed)
Will see her tomorrow or day after, chronic pericardial effusion

## 2023-03-12 NOTE — Telephone Encounter (Signed)
Patient called to inform us that she was at her Rheumatologist office today and they are trying to admit her to the hospital due to low bp. It was 83/63, patient feels dizzy and refuse to go to the hospital. Patient is calling to see what you want her to do should she go to the ER or take another Midodrine

## 2023-03-13 ENCOUNTER — Encounter: Payer: Self-pay | Admitting: Internal Medicine

## 2023-03-13 ENCOUNTER — Encounter: Payer: Self-pay | Admitting: Cardiology

## 2023-03-13 ENCOUNTER — Ambulatory Visit: Payer: Medicaid Other | Admitting: Cardiology

## 2023-03-13 ENCOUNTER — Ambulatory Visit: Payer: Medicaid Other | Attending: Internal Medicine | Admitting: Internal Medicine

## 2023-03-13 VITALS — BP 108/62 | HR 91 | Ht 67.0 in | Wt 208.8 lb

## 2023-03-13 VITALS — BP 103/79 | HR 77 | Resp 16 | Ht 67.0 in | Wt 207.2 lb

## 2023-03-13 DIAGNOSIS — I5022 Chronic systolic (congestive) heart failure: Secondary | ICD-10-CM

## 2023-03-13 DIAGNOSIS — I428 Other cardiomyopathies: Secondary | ICD-10-CM

## 2023-03-13 DIAGNOSIS — Z95810 Presence of automatic (implantable) cardiac defibrillator: Secondary | ICD-10-CM | POA: Diagnosis not present

## 2023-03-13 DIAGNOSIS — I3139 Other pericardial effusion (noninflammatory): Secondary | ICD-10-CM

## 2023-03-13 DIAGNOSIS — I951 Orthostatic hypotension: Secondary | ICD-10-CM

## 2023-03-13 MED ORDER — METOPROLOL SUCCINATE ER 25 MG PO TB24
25.0000 mg | ORAL_TABLET | Freq: Every day | ORAL | 2 refills | Status: DC
Start: 2023-03-13 — End: 2023-04-12

## 2023-03-13 MED ORDER — FARXIGA 10 MG PO TABS: 10.0000 mg | ORAL_TABLET | Freq: Every day | ORAL | 1 refills | Status: DC

## 2023-03-13 NOTE — Patient Instructions (Addendum)
Medication Instructions:  Your physician recommends that you continue on your current medications as directed. Please refer to the Current Medication list given to you today.  *If you need a refill on your cardiac medications before your next appointment, please call your pharmacy*  Lab Work: None ordered.  If you have labs (blood work) drawn today and your tests are completely normal, you will receive your results only by: MyChart Message (if you have MyChart) OR A paper copy in the mail If you have any lab test that is abnormal or we need to change your treatment, we will call you to review the results.  Testing/Procedures: None ordered.  Follow-Up: At [facility], you and your health needs are our priority.  As part of our continuing mission to provide you with exceptional heart care, we have created designated Provider Care Teams.  These Care Teams include your primary Cardiologist (physician) and Advanced Practice Providers (APPs -  Physician Assistants and Nurse Practitioners) who all work together to provide you with the care you need, when you need it.  Your next appointment:   Please schedule a 6 month follow up appointment with Dr. Gregg Taylor   The format for your next appointment:   In Person  Provider:   Gregg Taylor, MD{or one of the following Advanced Practice Providers on your designated Care Team:   Renee Ursuy, PA-C Michael "Andy" Tillery, PA-C  

## 2023-03-13 NOTE — Progress Notes (Signed)
HPI Ms. Gunnison returns today for evaluation of her ICD pocket. She is a pleasant 57 yo woman with a non-ischemic CM, s/p Biv ICD insertion. She had her device reprogrammed turning her RV lead back on. She c/o movement of her device when she turns onto her right or left side. She continues to have class 2B CHF symptoms. She has been treated with GDMT.    No Known Allergies   Current Outpatient Medications  Medication Sig Dispense Refill   busPIRone (BUSPAR) 15 MG tablet Take 15 mg by mouth 2 (two) times daily.     CALCIUM PO Take 1 tablet by mouth 2 (two) times daily. chewable     carvedilol (COREG) 6.25 MG tablet TAKE 1 TABLET BY MOUTH TWICE A DAY 180 tablet 1   dicyclomine (BENTYL) 20 MG tablet Take 1 tablet (20 mg total) by mouth 2 (two) times daily as needed for spasms. 20 tablet 0   FARXIGA 10 MG TABS tablet Take 1 tablet by mouth daily before breakfast.     Ferrous Sulfate (IRON PO) Take 1 tablet by mouth daily.     folic acid (FOLVITE) 1 MG tablet Take 3 mg by mouth daily with lunch.     magnesium hydroxide (MILK OF MAGNESIA) 400 MG/5ML suspension Take 30 mLs by mouth daily as needed for mild constipation.     MAGnesium-Oxide 400 (240 Mg) MG tablet Take 1 tablet (400 mg total) by mouth daily. 90 tablet 3   methotrexate (RHEUMATREX) 2.5 MG tablet Take 20 mg by mouth every Sunday. Caution:Chemotherapy. Protect from light.     midodrine (PROAMATINE) 10 MG tablet Take 1 tablet (10 mg total) by mouth 3 (three) times daily with meals as needed. 90 tablet 0   Multiple Vitamin (MULTIVITAMIN WITH MINERALS) TABS tablet Take 1 tablet by mouth daily with lunch.     pantoprazole (PROTONIX) 40 MG tablet Take 40 mg by mouth daily as needed (acid reflux).     Potassium Chloride ER 20 MEQ TBCR Take 20 mEq by mouth daily as needed. Take when you take demadex     Sertraline HCl 200 MG CAPS Take 200 mg by mouth daily with lunch.     spironolactone (ALDACTONE) 25 MG tablet Take 1 tablet (25 mg  total) by mouth daily. 90 tablet 3   tirzepatide (MOUNJARO) 10 MG/0.5ML Pen Inject 10 mg into the skin once a week.     torsemide (DEMADEX) 20 MG tablet Take 1 tablet (20 mg total) by mouth daily as needed. 90 tablet 3   valsartan (DIOVAN) 40 MG tablet TAKE 1 TABLET BY MOUTH EVERY DAY IN THE EVENING 90 tablet 1   vitamin B-12 (CYANOCOBALAMIN) 500 MCG tablet Take 500 mcg by mouth daily with lunch.     Vitamin D, Ergocalciferol, (DRISDOL) 1.25 MG (50000 UT) CAPS capsule Take 50,000 Units by mouth every Tuesday.     No current facility-administered medications for this visit.     Past Medical History:  Diagnosis Date   Cataract    CHF (congestive heart failure) (HCC)    Depression    Diabetes mellitus without complication (HCC)    Encounter for adjustment of biventricular implantable cardioverter-defibrillator (ICD) 06/13/2019   GERD (gastroesophageal reflux disease)    Hypertension    ICD: Cardiac defibrillator in situ 01/13/2015   [employer] Bi-V ICD (Dynagen X4 CRT-D) 01/13/2015 at [facility] - [facility] Compatible  Scheduled Remote ICD check 4.22.20: 1 SVT/8 secs, normal function. Battery longevity  is 8 years. RA pacing is 1 %, RV pacing is 99 %, and LV pacing is 99 %.   Nonischemic cardiomyopathy (HCC)    a. EF 35-40% by cath in 2011 with normal cors b. s/p ICD implantation in 12/2014 c. EF 25% by NST in 02/01/2016   Sarcoidosis    Sarcoidosis 02/08/2016    ROS:   All systems reviewed and negative except as noted in the HPI.   Past Surgical History:  Procedure Laterality Date   ABDOMINAL SURGERY  12/20/2020    laparoscopic sleeve gastrectomy   CARDIAC CATHETERIZATION N/A 02/08/2016   Procedure: Left Heart Cath and Coronary Angiography;  Surgeon: Corky Crafts, MD;  [facility] INVASIVE CV LAB;  Service: Cardiovascular;  Laterality: N/A;   CATARACT EXTRACTION Right 12/04/2018   CESAREAN SECTION     CHOLECYSTECTOMY     INSERTION OF ICD     a. s/p dual-chamber  [employer] ICD 12/2014   LAPAROSCOPIC GASTRIC SLEEVE RESECTION     PACEMAKER INSERTION     RIGHT/LEFT HEART CATH AND CORONARY ANGIOGRAPHY N/A 07/12/2021   Procedure: RIGHT/LEFT HEART CATH AND CORONARY ANGIOGRAPHY;  Surgeon: Elder Negus, MD;  Location: [facility] INVASIVE CV LAB;  Service: Cardiovascular;  Laterality: N/A;   TUBAL LIGATION       Family History  Problem Relation Age of Onset   Heart attack Father        a. Initial onset in his 27's. S/p CABG   Heart failure Father      Social History   Socioeconomic History   Marital status: Single    Spouse name: Not on file   Number of children: 5   Years of education: Not on file   Highest education level: Not on file  Occupational History   Not on file  Tobacco Use   Smoking status: Former    Packs/day: 1.00    Years: 20.00    Additional pack years: 0.00    Total pack years: 20.00    Types: Cigarettes    Quit date: 2016    Years since quitting: 8.3   Smokeless tobacco: Never  Vaping Use   Vaping Use: Never used  Substance and Sexual Activity   Alcohol use: No    Alcohol/week: 0.0 standard drinks of alcohol   Drug use: No   Sexual activity: Not on file  Other Topics Concern   Not on file  Social History Narrative   Not on file   Social Determinants of Health   Financial Resource Strain: Not on file  Food Insecurity: Not on file  Transportation Needs: Not on file  Physical Activity: Not on file  Stress: Not on file  Social Connections: Not on file  Intimate Partner Violence: Not on file     BP 108/62   Pulse 91   Ht 5\' 7"  (1.702 m)   Wt 208 lb 12.8 oz (94.7 kg)   LMP 09/14/1991 (Within Weeks)   SpO2 97%   BMI 32.70 kg/m   Physical Exam:  Well appearing NAD HEENT: Unremarkable Neck:  No JVD, no thyromegally Lymphatics:  No adenopathy Back:  No CVA tenderness Lungs:  Clear HEART:  Regular rate rhythm, no murmurs, no rubs, no clicks Abd:  soft, positive bowel sounds, no organomegally,  no rebound, no guarding Ext:  2 plus pulses, no edema, no cyanosis, no clubbing Skin:  No rashes no nodules Neuro:  CN II through XII intact, motor grossly intact  EKG - nsr with biv  pacing  DEVICE  Normal device function.  See PaceArt for details.   Assess/Plan:  CHB - the patient has developed CHB and was pacing only in the LV. RV pacing is now back on. She still has a paced QRS of 150 ms despite nice positions of her leads. Biv ICD - her device is working normally. I would not recommend another operation at this time. Rather, when her device reaches ERI, we would consider placing the new device under pectoralis major. Chronic systolic heart failure - she is class 2B symptoms. No change in meds. Sharlot Gowda Donnae Michels,MD

## 2023-03-13 NOTE — Progress Notes (Signed)
Primary Physician/Referring:  Kennith Maes, PA-C  Patient ID: Alicia Cook, female    DOB: Mar 26, 1966, 57 y.o.   MRN: [id number]  Chief Complaint  Patient presents with   Chronic systolic CHF (congestive heart failure)   Hypotension   HPI:    Alicia Cook  is a 57 y.o. 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   Since gastric sleeve surgery on 12/20/2020, she had lost 60-70 pounds in weight. Since gastric banding, she also has had difficulty in taking guideline directed medical therapy due to severe hypotension.  About 6 months ago she had acute decompensated heart failure with addition of Farxiga, she has been tolerating this well and very low-dose of valsartan and carvedilol.  She also uses midodrine on a as needed basis.  I was called by her rheumatologist and also pulmonary medicine concerned about low blood pressure, recent CT scan revealing enlarging pericardial effusion.  Hence I made her coming into the office to be seen on urgent basis.  Patient is asymptomatic, states that she does occasionally get dizzy and takes midodrine on a as needed basis.  She has no syncope, otherwise has continued to remain active and continue to work full-time without any problems.  She held taking carvedilol and valsartan yesterday as per my instructions over the telephone.  Past Medical History:  Diagnosis Date   Cataract    CHF (congestive heart failure) (HCC)    Depression    Diabetes mellitus without complication (HCC)    Encounter for adjustment of biventricular implantable cardioverter-defibrillator (ICD) 06/13/2019   GERD (gastroesophageal reflux disease)     Hypertension    ICD: Cardiac defibrillator in situ 01/13/2015   [employer] Bi-V ICD (Dynagen X4 CRT-D) 01/13/2015 at [facility] - MRI Compatible  Scheduled Remote ICD check 4.22.20: 1 SVT/8 secs, normal function. Battery longevity is 8 years. RA pacing is 1 %, RV pacing is 99 %, and LV pacing is 99 %.   Nonischemic cardiomyopathy (HCC)    a. EF 35-40% by cath in 2011 with normal cors b. s/p ICD implantation in 12/2014 c. EF 25% by NST in 02/01/2016   Sarcoidosis    Sarcoidosis 02/08/2016   Past Surgical History:  Procedure Laterality Date   ABDOMINAL SURGERY  12/20/2020    laparoscopic sleeve gastrectomy   CARDIAC CATHETERIZATION N/A 02/08/2016   Procedure: Left Heart Cath and Coronary Angiography;  Surgeon: Corky Crafts, MD;  Location: [facility] INVASIVE CV LAB;  Service: Cardiovascular;  Laterality: N/A;   CATARACT EXTRACTION Right 12/04/2018   CESAREAN SECTION     CHOLECYSTECTOMY     INSERTION OF ICD     a. s/p dual-chamber [employer] ICD 12/2014   LAPAROSCOPIC GASTRIC SLEEVE RESECTION     PACEMAKER INSERTION     RIGHT/LEFT HEART CATH AND CORONARY ANGIOGRAPHY N/A 07/12/2021   Procedure: RIGHT/LEFT HEART CATH AND CORONARY ANGIOGRAPHY;  Surgeon: Elder Negus, MD;  Location: [facility] INVASIVE CV LAB;  Service: Cardiovascular;  Laterality: N/A;   TUBAL LIGATION     Family History  Problem Relation Age of Onset   Heart attack Father        a. Initial onset in his 6's. S/p CABG   Heart failure Father  Social History   Tobacco Use   Smoking status: Former    Packs/day: 1.00    Years: 20.00    Additional pack years: 0.00    Total pack years: 20.00    Types: Cigarettes    Quit date: 2016    Years since quitting: 8.3   Smokeless tobacco: Never  Substance Use Topics   Alcohol use: No    Alcohol/week: 0.0 standard drinks of alcohol   Marital Status: Single  ROS  Review of Systems  Cardiovascular:  Negative for chest pain, dyspnea on exertion, leg  swelling and syncope.  Neurological:  Positive for dizziness (rare).   Objective  Blood pressure 103/79, pulse 77, resp. rate 16, height 5\' 7"  (1.702 m), weight 207 lb 3.2 oz (94 kg), last menstrual period 09/14/1991, SpO2 98 %.     03/13/2023   11:48 AM 03/13/2023   10:43 AM 02/05/2023   11:30 PM  Vitals with BMI  Height 5\' 7"  5\' 7"    Weight 207 lbs 3 oz 208 lbs 13 oz   BMI 32.44 32.69   Systolic 103 108 865  Diastolic 79 62 79  Pulse 77 91 97   Orthostatic VS for the past 72 hrs (Last 3 readings):  Patient Position BP Location Cuff Size  03/13/23 1148 Sitting Left Arm Large     Physical Exam Constitutional:      Appearance: She is obese.  Neck:     Vascular: No carotid bruit or JVD.  Cardiovascular:     Rate and Rhythm: Normal rate and regular rhythm.     Pulses: Intact distal pulses.     Heart sounds: No murmur heard.    No gallop.     Comments: No pulsus paradoxus Pulmonary:     Effort: Pulmonary effort is normal.     Breath sounds: Normal breath sounds. No decreased breath sounds, wheezing or rales.  Abdominal:     General: Bowel sounds are normal.     Palpations: Abdomen is soft.  Musculoskeletal:     Right lower leg: No edema.     Left lower leg: No edema.  Neurological:     Mental Status: She is alert.    Laboratory examination:      Latest Ref Rng & Units 02/05/2023   10:16 PM 02/01/2023    9:40 AM 09/30/2022    9:44 AM  CMP  Glucose 70 - 99 mg/dL 92  784  92   BUN 6 - 20 mg/dL 17  14  16    Creatinine 0.44 - 1.00 mg/dL 6.96  2.95  2.84   Sodium 135 - 145 mmol/L 135  136  140   Potassium 3.5 - 5.1 mmol/L 4.0  3.7  3.3   Chloride 98 - 111 mmol/L 102  100  108   CO2 22 - 32 mmol/L 26  27  25    Calcium 8.9 - 10.3 mg/dL 8.8  8.7  9.2   Total Protein 6.5 - 8.1 g/dL 6.6  7.2    Total Bilirubin 0.3 - 1.2 mg/dL 0.3  0.7    Alkaline Phos 38 - 126 U/L 90  101    AST 15 - 41 U/L 25  22    ALT 0 - 44 U/L 24  20        Latest Ref Rng & Units 02/05/2023   10:16 PM  02/01/2023    9:40 AM 09/30/2022    9:44 AM  CBC  WBC 4.0 - 10.5 K/uL 10.6  11.0  8.8   Hemoglobin 12.0 - 15.0 g/dL 19.1  47.8  29.5   Hematocrit 36.0 - 46.0 % 34.7  40.3  41.3   Platelets 150 - 400 K/uL 219  161  230    BNP    Component Value Date/Time   BNP 429.6 (H) 02/05/2023 2216   ProBNP    Component Value Date/Time   PROBNP 1,771 (H) 12/07/2021 1632   External labs:  Labs 12/15/2022: Total cholesterol 201, triglycerides 100, HDL 76, LDL 108.   Iron studies normal. Folate, B12 normal.  Hb 13.1/HCT 40.7, platelets 199.  Sodium 140, potassium 3.4, BUN 22, creatinine 0.79, EGFR 88 mL, LFTs normal.  TSH, free T3 normal.   Labs 6/23:  Sodium 142, potassium 4.0, BUN 21, creatinine 0.88, EGFR 78 mL.  Magnesium 2.3.  Hb 13.7/HCT 42.7, platelets 187.  08/23/2021:  A1c 5.6%.  Stage minimally reduced from 0.4 to.  Vitamin D 75.  Total cholesterol 216, triglycerides 109, HDL 74, LDL 120.  Non-HDL cholesterol 142. Allergies  No Known Allergies   Final Medications at End of Visit    Current Outpatient Medications:    CALCIUM PO, Take 1 tablet by mouth 2 (two) times daily. chewable, Disp: , Rfl:    dicyclomine (BENTYL) 20 MG tablet, Take 1 tablet (20 mg total) by mouth 2 (two) times daily as needed for spasms., Disp: 20 tablet, Rfl: 0   Ferrous Sulfate (IRON PO), Take 1 tablet by mouth daily., Disp: , Rfl:    folic acid (FOLVITE) 1 MG tablet, Take 3 mg by mouth daily with lunch., Disp: , Rfl:    magnesium hydroxide (MILK OF MAGNESIA) 400 MG/5ML suspension, Take 30 mLs by mouth daily as needed for mild constipation., Disp: , Rfl:    MAGnesium-Oxide 400 (240 Mg) MG tablet, Take 1 tablet (400 mg total) by mouth daily., Disp: 90 tablet, Rfl: 3   Methotrexate 25 MG/ML SOSY, Inject 1 mL into the skin once a week., Disp: , Rfl:    metoprolol succinate (TOPROL-XL) 25 MG 24 hr tablet, Take 1 tablet (25 mg total) by mouth at bedtime. Take with or immediately following a meal., Disp:  30 tablet, Rfl: 2   midodrine (PROAMATINE) 10 MG tablet, Take 1 tablet (10 mg total) by mouth 3 (three) times daily with meals as needed., Disp: 90 tablet, Rfl: 0   Multiple Vitamin (MULTIVITAMIN WITH MINERALS) TABS tablet, Take 1 tablet by mouth daily with lunch., Disp: , Rfl:    pantoprazole (PROTONIX) 40 MG tablet, Take 40 mg by mouth daily as needed (acid reflux)., Disp: , Rfl:    Potassium Chloride ER 20 MEQ TBCR, Take 20 mEq by mouth daily as needed. Take when you take demadex, Disp: , Rfl:    Sertraline HCl 200 MG CAPS, Take 200 mg by mouth daily with lunch., Disp: , Rfl:    spironolactone (ALDACTONE) 25 MG tablet, Take 1 tablet (25 mg total) by mouth daily., Disp: 90 tablet, Rfl: 3   tirzepatide (MOUNJARO) 10 MG/0.5ML Pen, Inject 10 mg into the skin once a week., Disp: , Rfl:    torsemide (DEMADEX) 20 MG tablet, Take 1 tablet (20 mg total) by mouth daily as needed., Disp: 90 tablet, Rfl: 3   vitamin B-12 (CYANOCOBALAMIN) 500 MCG tablet, Take 500 mcg by mouth daily with lunch., Disp: , Rfl:    Vitamin D, Ergocalciferol, (DRISDOL) 1.25 MG (50000 UT) CAPS capsule, Take 50,000 Units by mouth every Tuesday., Disp: , Rfl:  FARXIGA 10 MG TABS tablet, Take 1 tablet (10 mg total) by mouth daily before breakfast., Disp: 90 tablet, Rfl: 1   valsartan (DIOVAN) 40 MG tablet, TAKE 1 TABLET BY MOUTH EVERY DAY IN THE EVENING (Patient not taking: Reported on 03/13/2023), Disp: 90 tablet, Rfl: 1   Radiology:  CTA chest 03/12/2022: 1. No evidence for acute pulmonary embolus. 2. Cardiac enlargement, trace bilateral pleural effusions and mild pulmonary edema compatible with CHF.  DG Chest 2 View 04/19/2021 Stable cardiomegaly. Increased density posterior to the heart border at the lung base may represent atrial enlargement or increasing pericardial effusion.   Chest x-ray 06/08/2021: 1. Interval increase in size of the cardiac silhouette which may  reflect increased cardiomegaly or increased size of the  known  pericardial effusion. Consider further evaluation with cardiac echo.  2. Tiny bilateral pleural effusions with bibasilar predominant  interstitial and airspace opacities likely reflecting pulmonary edema.   High-resolution CT scan of the chest 03/02/2023: 1. No evidence of interstitial lung disease.  2. Moderate pericardial effusion, enlarged from 09/02/2021.  3. Enlarged pulmonic trunk, indicative of pulmonary arterial hypertension.    Cardiac Studies:   Right heart catheterization and myocardial biopsy 07/17/2016: Mild pulmonary hypertension, mildly elevated pulmonary capillary wedge pressure, biopsy negative for sarcoid.  Right and left heart catheterization coronary angiography 07/12/2021: Right heart pressures: LV EDP is normal. RA: 0 mmHg RV: 19/0 mmHg PA: 21/3 mmHg, mPAP 10 mmHg PCW: 4 mmHg CO: 5.7 L/min CI: 2.7 L/min/m2  Conclusion:  Left dominant circulation No coronary artery disease Well compensated nonischemic cardiomyopathy  PET cardiac sarcoidosis 09/30/2021: 1.  Moderate perfusion defect in the lateral wall of the heart.  2.  No definite evidence of myocardial uptake to suggest active myocardial sarcoidosis.  3.  Left ventricular ejection fraction is 18%. Please correlate with cardiac ultrasound  4.  Multilevel FDG avid mediastinal and bilateral hilar lymphadenopathy most compatible with sequela of sarcoidosis.  PCV ECHOCARDIOGRAM COMPLETE 01/25/2022 Moderately depressed LV systolic function with visual EF 30-35%. Left ventricle cavity is dilated. Mild left ventricular hypertrophy. Regional wall motion abnormalities difficult to assess accurately due to dilated LV cavity, global hypokinesis, and pacemaker.  Doppler evidence of grade II (pseudonormal) diastolic dysfunction, elevated LAP. Left atrial cavity is mildly dilated. IAS bows from left to right suggestive of elevated LAP.Moderate (Grade III) mitral regurgitation, posteriorly directed, wall-impinging  jet. Mild tricuspid regurgitation. No evidence of pulmonary hypertension. Small pericardial effusion, predominantly located posteriorly.  There is no hemodynamic significance. Compared to study 06/24/2021 LVEF improved form 25% to 30-35%, G1DD is now Grade 2, otherwise no significant change.   ICD   Remote BiV ICD transmission 12/12/2022: Longevity 3 years and 0 months.  AP 0%, CRT 99 %.  Lead impedance and thresholds are stable.  Brief AT. Normal BiV ICD function.  EKG   EKG 07/28/22: AV paced rhythm with rate 107 bpm.  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  Wide QRS complex.  Compared to previous EKG no significant change.  04/03/2022: AV paced rhythm at rate of 95 bpm.  Biventricular pacing detected.  Wide QRS complex.  No further analysis.  Unchanged compared to previous.   Assessment     ICD-10-CM   1. Pericardial effusion (noninflammatory)  I31.39     2. Chronic HFrEF (heart failure with reduced ejection fraction) (HCC)  I50.22 metoprolol succinate (TOPROL-XL) 25 MG 24 hr tablet    FARXIGA 10 MG TABS tablet    3. Nonischemic cardiomyopathy (HCC)  I42.8     4. Orthostatic hypotension  I95.1       Meds ordered this encounter  Medications   metoprolol succinate (TOPROL-XL) 25 MG 24 hr tablet    Sig: Take 1 tablet (25 mg total) by mouth at bedtime. Take with or immediately following a meal.    Dispense:  30 tablet    Refill:  2   FARXIGA 10 MG TABS tablet    Sig: Take 1 tablet (10 mg total) by mouth daily before breakfast.    Dispense:  90 tablet    Refill:  1    Medications Discontinued During This Encounter  Medication Reason   carvedilol (COREG) 6.25 MG tablet Dose change   busPIRone (BUSPAR) 15 MG tablet Patient has not taken in last 30 days   methotrexate (RHEUMATREX) 2.5 MG tablet Change in therapy   FARXIGA 10 MG TABS tablet Reorder   Recommendations:   Alicia Cook is a 57 y.o. 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   Since gastric sleeve surgery on 12/20/2020, she had lost 60-70 pounds in weight. Since gastric banding, she also has had difficulty in taking guideline directed medical therapy due to severe hypotension.  About 6 months ago she had acute decompensated heart failure with addition of Farxiga, she has been tolerating this well and very low-dose of valsartan and carvedilol.  She also uses midodrine on a as needed basis.  1. Pericardial effusion (noninflammatory) Patient has moderate pericardial effusion.  She has no symptoms from this, I am not sure if for hypotension is related to effusion, she has no pulses paradoxus, no elevated JVD, no leg edema no clinical symptoms of any ascites.  She may have asymptomatic pericardial effusion.  However a repeat echocardiogram is pending, if indeed large, we could certainly attempt therapeutic pericardial tap to see if her blood pressure would improve.  For now advised her to discontinue both Coreg and valsartan low-dose that she was on at 6.25 mg twice daily and valsartan 80 mg daily in view of low blood pressure, I would like to try metoprolol succinate 25 mg in the evening to see whether she would tolerate this in view of heart failure hopefully peripheral vasoconstriction may help with orthostasis.  2. Chronic HFrEF (heart failure with reduced ejection fraction) (HCC) Patient is not on guideline directed medical therapy in view of soft blood pressure.  There is no pulses paradoxus in spite of pericardial effusion.  I started her on metoprolol succinate for heart failure and discontinue carvedilol.  She will continue to hold valsartan for now.  Echocardiogram is  pending.  Since addition of Farxiga 6 months ago, she has not had any acute decompensated heart failure, hence we will continue the same along with spironolactone.   - metoprolol succinate (TOPROL-XL) 25 MG 24 hr tablet; Take 1 tablet (25 mg total) by mouth at bedtime. Take with or immediately following a meal.  Dispense: 30 tablet; Refill: 2 - FARXIGA 10 MG TABS tablet; Take 1 tablet (10 mg total) by mouth daily before breakfast.  Dispense: 90 tablet; Refill: 1  3. Nonischemic cardiomyopathy [facility]) Patient has nonischemic cardiomyopathy.  She now has BiV ICD, which is functioning normally.  4. Orthostatic hypotension Pericardial effusion could  be contributing to this, however diabetes mellitus that was previously uncontrolled along with morbid obesity may have been contributing.  Will follow-up on echocardiogram and consider a pericardial tap, I have discussed with the patient regarding risks and benefits of pericardial tap.  She is willing to proceed if indicated.  To see her back in 4 weeks for follow-up.    Yates Decamp, MD, [facility] 03/13/2023, 12:52 PM Office: [phone] Fax: [phone] Pager: [phone]

## 2023-03-14 ENCOUNTER — Encounter: Payer: Self-pay | Admitting: Cardiology

## 2023-03-17 LAB — PRO B NATRIURETIC PEPTIDE: NT-Pro BNP: 1753 pg/mL — ABNORMAL HIGH (ref 0–287)

## 2023-03-19 ENCOUNTER — Ambulatory Visit: Payer: Medicaid Other

## 2023-03-19 DIAGNOSIS — I428 Other cardiomyopathies: Secondary | ICD-10-CM

## 2023-03-19 DIAGNOSIS — I5022 Chronic systolic (congestive) heart failure: Secondary | ICD-10-CM

## 2023-03-20 NOTE — Progress Notes (Signed)
Echocardiogram 03/19/2023:  Left ventricle cavity is severely dilated. Normal left ventricular wall thickness. Hypokinetic global wall motion. Abnormal septal wall motion due to left bundle branch block. Indeterminate diastolic filling pattern due to mitral regurgitation and paced rhythm. Severely depressed LV systolic function with visual EF <20%-25%. Calculated EF 31%. Left atrial cavity is severely dilated at 49.9 ml/m^2. Structurally normal mitral valve. Annulus is dilated. There is mild restriction in the posterior MV leaflet motion with resultant posteriorly directed moderate to severe mitral regurgitation. Dense/triangular CWD jet. Structurally normal tricuspid valve. Mild tricuspid regurgitation. Mild pulmonary hypertension. RVSP measures 39 mmHg. Pericardium is normal in appearance. Small circumferential pericardial effusion (0.5 to 1.2 cm) with clear fluids. No evidence of pleural effusion. There is no hemodynamic significance. No significant change from 08/30/2022, pericardial effusion is new.   Pericardial effusion is very small to schedule her for a tap, and also not sure that this will benefit her in any ways.

## 2023-04-09 ENCOUNTER — Ambulatory Visit: Payer: Medicaid Other | Admitting: Cardiology

## 2023-04-10 ENCOUNTER — Encounter (HOSPITAL_COMMUNITY): Payer: Self-pay | Admitting: Cardiology

## 2023-04-10 ENCOUNTER — Ambulatory Visit: Payer: Medicaid Other | Admitting: Cardiology

## 2023-04-10 ENCOUNTER — Other Ambulatory Visit: Payer: Self-pay

## 2023-04-10 ENCOUNTER — Encounter (HOSPITAL_COMMUNITY): Payer: Self-pay

## 2023-04-10 ENCOUNTER — Encounter: Payer: Self-pay | Admitting: Cardiology

## 2023-04-10 ENCOUNTER — Inpatient Hospital Stay (HOSPITAL_COMMUNITY)
Admission: RE | Admit: 2023-04-10 | Discharge: 2023-04-12 | DRG: 286 | Disposition: A | Discharge status: Home / Self Care | Payer: Medicaid Other | Source: Ambulatory Visit | Attending: Cardiology | Admitting: Cardiology

## 2023-04-10 VITALS — BP 84/56 | HR 96 | Resp 16 | Ht 67.0 in | Wt 205.4 lb

## 2023-04-10 DIAGNOSIS — Z79899 Other long term (current) drug therapy: Secondary | ICD-10-CM

## 2023-04-10 DIAGNOSIS — I428 Other cardiomyopathies: Secondary | ICD-10-CM | POA: Diagnosis present

## 2023-04-10 DIAGNOSIS — I340 Nonrheumatic mitral (valve) insufficiency: Secondary | ICD-10-CM | POA: Diagnosis present

## 2023-04-10 DIAGNOSIS — G4733 Obstructive sleep apnea (adult) (pediatric): Secondary | ICD-10-CM | POA: Diagnosis present

## 2023-04-10 DIAGNOSIS — D869 Sarcoidosis, unspecified: Secondary | ICD-10-CM | POA: Diagnosis present

## 2023-04-10 DIAGNOSIS — I5023 Acute on chronic systolic (congestive) heart failure: Secondary | ICD-10-CM | POA: Diagnosis not present

## 2023-04-10 DIAGNOSIS — Z87891 Personal history of nicotine dependence: Secondary | ICD-10-CM | POA: Diagnosis not present

## 2023-04-10 DIAGNOSIS — I272 Pulmonary hypertension, unspecified: Secondary | ICD-10-CM | POA: Diagnosis present

## 2023-04-10 DIAGNOSIS — I5084 End stage heart failure: Secondary | ICD-10-CM | POA: Diagnosis not present

## 2023-04-10 DIAGNOSIS — Z9884 Bariatric surgery status: Secondary | ICD-10-CM

## 2023-04-10 DIAGNOSIS — I5082 Biventricular heart failure: Secondary | ICD-10-CM | POA: Diagnosis present

## 2023-04-10 DIAGNOSIS — K219 Gastro-esophageal reflux disease without esophagitis: Secondary | ICD-10-CM | POA: Diagnosis present

## 2023-04-10 DIAGNOSIS — Z8679 Personal history of other diseases of the circulatory system: Secondary | ICD-10-CM

## 2023-04-10 DIAGNOSIS — I519 Heart disease, unspecified: Secondary | ICD-10-CM

## 2023-04-10 DIAGNOSIS — I951 Orthostatic hypotension: Secondary | ICD-10-CM | POA: Diagnosis present

## 2023-04-10 DIAGNOSIS — R627 Adult failure to thrive: Secondary | ICD-10-CM | POA: Diagnosis present

## 2023-04-10 DIAGNOSIS — I110 Hypertensive heart disease with heart failure: Secondary | ICD-10-CM | POA: Diagnosis present

## 2023-04-10 DIAGNOSIS — R6251 Failure to thrive (child): Secondary | ICD-10-CM

## 2023-04-10 DIAGNOSIS — Z95810 Presence of automatic (implantable) cardiac defibrillator: Secondary | ICD-10-CM | POA: Diagnosis present

## 2023-04-10 DIAGNOSIS — F32A Depression, unspecified: Secondary | ICD-10-CM | POA: Diagnosis present

## 2023-04-10 DIAGNOSIS — D860 Sarcoidosis of lung: Secondary | ICD-10-CM | POA: Diagnosis present

## 2023-04-10 DIAGNOSIS — I447 Left bundle-branch block, unspecified: Secondary | ICD-10-CM | POA: Diagnosis present

## 2023-04-10 DIAGNOSIS — E876 Hypokalemia: Secondary | ICD-10-CM | POA: Diagnosis not present

## 2023-04-10 DIAGNOSIS — Z6832 Body mass index (BMI) 32.0-32.9, adult: Secondary | ICD-10-CM | POA: Diagnosis not present

## 2023-04-10 DIAGNOSIS — E119 Type 2 diabetes mellitus without complications: Secondary | ICD-10-CM

## 2023-04-10 DIAGNOSIS — Z8249 Family history of ischemic heart disease and other diseases of the circulatory system: Secondary | ICD-10-CM

## 2023-04-10 DIAGNOSIS — I5022 Chronic systolic (congestive) heart failure: Secondary | ICD-10-CM

## 2023-04-10 DIAGNOSIS — E1143 Type 2 diabetes mellitus with diabetic autonomic (poly)neuropathy: Secondary | ICD-10-CM | POA: Diagnosis present

## 2023-04-10 LAB — COMPREHENSIVE METABOLIC PANEL
ALT: 19 U/L (ref 0–44)
AST: 25 U/L (ref 15–41)
Albumin: 3.7 g/dL (ref 3.5–5.0)
Alkaline Phosphatase: 115 U/L (ref 38–126)
Anion gap: 9 (ref 5–15)
BUN: 14 mg/dL (ref 6–20)
CO2: 26 mmol/L (ref 22–32)
Calcium: 9.5 mg/dL (ref 8.9–10.3)
Chloride: 102 mmol/L (ref 98–111)
Creatinine, Ser: 0.91 mg/dL (ref 0.44–1.00)
GFR, Estimated: >60 mL/min (ref >60)
Glucose, Bld: 134 mg/dL — ABNORMAL HIGH (ref 70–99)
Potassium: 4.3 mmol/L (ref 3.5–5.1)
Sodium: 137 mmol/L (ref 135–145)
Total Bilirubin: 0.6 mg/dL (ref 0.3–1.2)
Total Protein: 7.2 g/dL (ref 6.5–8.1)

## 2023-04-10 LAB — CBC
HCT: 43.6 % (ref 36.0–46.0)
Hemoglobin: 13.8 g/dL (ref 12.0–15.0)
MCH: 28.6 pg (ref 26.0–34.0)
MCHC: 31.7 g/dL (ref 30.0–36.0)
MCV: 90.5 fL (ref 80.0–100.0)
Platelets: 177 10*3/uL (ref 150–400)
RBC: 4.82 MIL/uL (ref 3.87–5.11)
RDW: 13.1 % (ref 11.5–15.5)
WBC: 8 10*3/uL (ref 4.0–10.5)
nRBC: 0 % (ref 0.0–0.2)

## 2023-04-10 LAB — TSH: TSH: 1.11 u[IU]/mL (ref 0.350–4.500)

## 2023-04-10 LAB — MAGNESIUM: Magnesium: 2.2 mg/dL (ref 1.7–2.4)

## 2023-04-10 LAB — TROPONIN I (HIGH SENSITIVITY)
Troponin I (High Sensitivity): 14 ng/L (ref <18)
Troponin I (High Sensitivity): 14 ng/L (ref <18)

## 2023-04-10 LAB — BRAIN NATRIURETIC PEPTIDE: B Natriuretic Peptide: 633.1 pg/mL — ABNORMAL HIGH (ref 0.0–100.0)

## 2023-04-10 MED ORDER — SERTRALINE HCL 100 MG PO TABS
200.0000 mg | ORAL_TABLET | Freq: Every day | ORAL | Status: DC
  Administered 2023-04-11 – 2023-04-12 (×2): 200 mg via ORAL
  Filled 2023-04-10 (×2): qty 2

## 2023-04-10 MED ORDER — METOPROLOL SUCCINATE ER 25 MG PO TB24
25.0000 mg | ORAL_TABLET | Freq: Every day | ORAL | Status: DC
  Administered 2023-04-10 – 2023-04-11 (×2): 25 mg via ORAL
  Filled 2023-04-10 (×2): qty 1

## 2023-04-10 MED ORDER — PANTOPRAZOLE SODIUM 40 MG PO TBEC
40.0000 mg | DELAYED_RELEASE_TABLET | Freq: Every day | ORAL | Status: DC | PRN
  Administered 2023-04-11: 40 mg via ORAL
  Filled 2023-04-10: qty 1

## 2023-04-10 MED ORDER — IVABRADINE HCL 7.5 MG PO TABS
7.5000 mg | ORAL_TABLET | Freq: Two times a day (BID) | ORAL | 2 refills | Status: DC
Start: 2023-04-10 — End: 2023-05-23

## 2023-04-10 MED ORDER — MIDODRINE HCL 5 MG PO TABS: 10.0000 mg | ORAL_TABLET | Freq: Three times a day (TID) | ORAL | Status: DC | PRN

## 2023-04-10 MED ORDER — SODIUM CHLORIDE 0.9 % IV SOLN: 250.0000 mL | INTRAVENOUS | Status: DC | PRN

## 2023-04-10 MED ORDER — DIGOXIN 125 MCG PO TABS: 0.2500 mg | ORAL_TABLET | Freq: Every day | ORAL | Status: DC

## 2023-04-10 MED ORDER — IVABRADINE HCL 5 MG PO TABS
7.5000 mg | ORAL_TABLET | Freq: Two times a day (BID) | ORAL | Status: DC
  Administered 2023-04-11 – 2023-04-12 (×3): 7.5 mg via ORAL
  Filled 2023-04-10 (×4): qty 1

## 2023-04-10 MED ORDER — ONDANSETRON HCL 4 MG/2ML IJ SOLN
4.0000 mg | Freq: Four times a day (QID) | INTRAMUSCULAR | Status: DC | PRN
  Administered 2023-04-11: 4 mg via INTRAVENOUS

## 2023-04-10 MED ORDER — DIGOXIN 125 MCG PO TABS
0.2500 mg | ORAL_TABLET | Freq: Every day | ORAL | Status: DC
  Administered 2023-04-11 – 2023-04-12 (×2): 0.25 mg via ORAL
  Filled 2023-04-10: qty 2

## 2023-04-10 MED ORDER — SPIRONOLACTONE 25 MG PO TABS
25.0000 mg | ORAL_TABLET | Freq: Every day | ORAL | Status: DC
  Administered 2023-04-10 – 2023-04-12 (×3): 25 mg via ORAL
  Filled 2023-04-10 (×3): qty 1

## 2023-04-10 MED ORDER — ACETAMINOPHEN 325 MG PO TABS
650.0000 mg | ORAL_TABLET | ORAL | Status: DC | PRN
  Administered 2023-04-10 – 2023-04-12 (×3): 650 mg via ORAL
  Filled 2023-04-10 (×4): qty 2

## 2023-04-10 MED ORDER — FUROSEMIDE 10 MG/ML IJ SOLN
20.0000 mg | Freq: Every day | INTRAMUSCULAR | Status: DC
  Administered 2023-04-10 – 2023-04-12 (×3): 20 mg via INTRAVENOUS
  Filled 2023-04-10 (×3): qty 2

## 2023-04-10 MED ORDER — MAGNESIUM OXIDE -MG SUPPLEMENT 400 (240 MG) MG PO TABS
400.0000 mg | ORAL_TABLET | Freq: Every day | ORAL | Status: DC
  Administered 2023-04-10 – 2023-04-12 (×3): 400 mg via ORAL
  Filled 2023-04-10 (×3): qty 1

## 2023-04-10 MED ORDER — DAPAGLIFLOZIN PROPANEDIOL 10 MG PO TABS
10.0000 mg | ORAL_TABLET | Freq: Every day | ORAL | Status: DC
  Administered 2023-04-11 – 2023-04-12 (×2): 10 mg via ORAL
  Filled 2023-04-10 (×2): qty 1

## 2023-04-10 MED ORDER — SODIUM CHLORIDE 0.9% FLUSH
3.0000 mL | Freq: Two times a day (BID) | INTRAVENOUS | Status: DC
  Administered 2023-04-11 – 2023-04-12 (×2): 3 mL via INTRAVENOUS

## 2023-04-10 MED ORDER — DIGOXIN 250 MCG PO TABS
0.2500 mg | ORAL_TABLET | Freq: Every day | ORAL | 2 refills | Status: DC
Start: 2023-04-10 — End: 2023-04-12

## 2023-04-10 MED ORDER — POTASSIUM CHLORIDE CRYS ER 10 MEQ PO TBCR: 20.0000 meq | EXTENDED_RELEASE_TABLET | Freq: Every day | ORAL | Status: DC | PRN

## 2023-04-10 MED ORDER — DIGOXIN 125 MCG PO TABS
0.2500 mg | ORAL_TABLET | ORAL | Status: AC
  Administered 2023-04-10 – 2023-04-11 (×3): 0.25 mg via ORAL
  Filled 2023-04-10 (×3): qty 2

## 2023-04-10 MED ORDER — FOLIC ACID 1 MG PO TABS
3.0000 mg | ORAL_TABLET | Freq: Every day | ORAL | Status: DC
  Administered 2023-04-11 – 2023-04-12 (×2): 3 mg via ORAL
  Filled 2023-04-10 (×2): qty 3

## 2023-04-10 MED ORDER — HEPARIN SODIUM (PORCINE) 5000 UNIT/ML IJ SOLN
5000.0000 [IU] | Freq: Three times a day (TID) | INTRAMUSCULAR | Status: AC
  Administered 2023-04-10 – 2023-04-11 (×4): 5000 [IU] via SUBCUTANEOUS
  Filled 2023-04-10 (×4): qty 1

## 2023-04-10 MED ORDER — SODIUM CHLORIDE 0.9% FLUSH: 3.0000 mL | INTRAVENOUS | Status: DC | PRN

## 2023-04-10 MED ORDER — ADULT MULTIVITAMIN W/MINERALS CH
1.0000 | ORAL_TABLET | Freq: Every day | ORAL | Status: DC
  Administered 2023-04-11 – 2023-04-12 (×2): 1 via ORAL
  Filled 2023-04-10 (×2): qty 1

## 2023-04-10 MED ORDER — FERROUS SULFATE 325 (65 FE) MG PO TABS
325.0000 mg | ORAL_TABLET | Freq: Every day | ORAL | Status: DC
  Administered 2023-04-10 – 2023-04-12 (×3): 325 mg via ORAL
  Filled 2023-04-10 (×3): qty 1

## 2023-04-10 NOTE — H&P (Signed)
CARDIOLOGY ADMIT NOTE   Patient ID: Alicia Cook MRN: [id number] DOB/AGE: 12-25-1965 57 y.o.  Admit date: 04/10/2023 Primary Physician:  Kennith Maes, PA-C  Patient ID: Alicia Cook, female    DOB: 1966-04-06, 57 y.o.   MRN: [id number]  CC: Chest pain, Dyspnea, fatigue  HPI:    Alicia Cook  is a 57 y.o.  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   Since gastric sleeve surgery on 12/20/2020, she had lost 70 pounds in weight. Since gastric banding, she also has had difficulty in taking guideline directed medical therapy due to severe hypotension.  About 6 months ago she had acute decompensated heart failure with addition of [place].  She also uses midodrine on a as needed basis for orthostatic hypotension.    She made an urgent appointment to see me today due to chest pain, not feeling well and fatigue.  I had seen her recently after she was evaluated by pulmonary medicine and she had a CT scan which revealed moderate pericardial effusion.  However it repeated the echocardiogram which revealed only mild to moderate effusion not enough to perform pericardiocentesis and hence could not explain her symptoms of fatigue.   Chest pain is described as sharp pain in the middle of the chest and points with 1 finger.  She also states that she is extremely fatigued and tired.  She feels fullness in her chest.  She has been taking diuretics on a daily basis.  Symptoms of fatigue, chest pain and dyspnea has been ongoing on and off for several months but over the last 1 to 2 weeks overall she feels extremely poor.  Past Medical History:   Diagnosis Date   Cataract    CHF (congestive heart failure) (HCC)    Depression    Diabetes mellitus without complication (HCC)    Encounter for adjustment of biventricular implantable cardioverter-defibrillator (ICD) 06/13/2019   GERD (gastroesophageal reflux disease)    Hypertension    ICD: Cardiac defibrillator in situ 01/13/2015   [employer] Bi-V ICD (Dynagen X4 CRT-D) 01/13/2015 at [facility] - MRI Compatible  Scheduled Remote ICD check 4.22.20: 1 SVT/8 secs, normal function. Battery longevity is 8 years. RA pacing is 1 %, RV pacing is 99 %, and LV pacing is 99 %.   Nonischemic cardiomyopathy (HCC)    a. EF 35-40% by cath in 2011 with normal cors b. s/p ICD implantation in 12/2014 c. EF 25% by NST in 02/01/2016   Sarcoidosis    Sarcoidosis 02/08/2016   Past Surgical History:  Procedure Laterality Date   ABDOMINAL SURGERY  12/20/2020    laparoscopic sleeve gastrectomy   CARDIAC CATHETERIZATION N/A 02/08/2016   Procedure: Left Heart Cath and Coronary Angiography;  Surgeon: Corky Crafts, MD;  Location: [facility] INVASIVE CV LAB;  Service: Cardiovascular;  Laterality: N/A;   CATARACT EXTRACTION Right 12/04/2018   CESAREAN SECTION     CHOLECYSTECTOMY     INSERTION OF ICD     a. s/p dual-chamber [employer] ICD 12/2014   LAPAROSCOPIC GASTRIC SLEEVE RESECTION     PACEMAKER INSERTION     RIGHT/LEFT HEART CATH AND CORONARY ANGIOGRAPHY N/A 07/12/2021   Procedure: RIGHT/LEFT HEART CATH AND CORONARY ANGIOGRAPHY;  Surgeon: Elder Negus, MD;  Location: [facility] INVASIVE CV LAB;  Service: Cardiovascular;  Laterality: N/A;   TUBAL LIGATION     Social History   Socioeconomic History   Marital status: Single    Spouse name: Not on file   Number of children: 5   Years of education: Not on file   Highest education level: Not on file  Occupational History   Not on file  Tobacco Use   Smoking status: Former    Packs/day: 1.00    Years: 20.00    Additional pack years:  0.00    Total pack years: 20.00    Types: Cigarettes    Quit date: 2016    Years since quitting: 8.4   Smokeless tobacco: Never  Vaping Use   Vaping Use: Never used  Substance and Sexual Activity   Alcohol use: No    Alcohol/week: 0.0 standard drinks of alcohol   Drug use: No   Sexual activity: Not on file  Other Topics Concern   Not on file  Social History Narrative   Not on file   Social Determinants of Health   Financial Resource Strain: Not on file  Food Insecurity: No Food Insecurity (04/10/2023)   Hunger Vital Sign    Worried About Running Out of Food in the Last Year: Never true    Ran Out of Food in the Last Year: Never true  Transportation Needs: No Transportation Needs (04/10/2023)   PRAPARE - Administrator, Civil Service (Medical): No    Lack of Transportation (Non-Medical): No  Physical Activity: Not on file  Stress: Not on file  Social Connections: Not on file  Intimate Partner Violence: Not At Risk (04/10/2023)   Humiliation, Afraid, Rape, and Kick questionnaire    Fear of Current or Ex-Partner: No    Emotionally Abused: No    Physically Abused: No    Sexually Abused: No   Family History  Problem Relation Age of Onset   Heart attack Father        a. Initial onset in his 25's. S/p CABG   Heart failure Father     ROS  Review of Systems  Constitutional: Positive for malaise/fatigue.  Cardiovascular:  Positive for chest pain and dyspnea on exertion. Negative for leg swelling.   Objective      04/10/2023    5:19 PM 04/10/2023    9:13 AM 03/13/2023   11:48 AM  Vitals with BMI  Height 5\' 7"  5\' 7"  5\' 7"   Weight 204 lbs 6 oz 205 lbs 6 oz 207 lbs 3 oz  BMI 32 32.16 32.44  Systolic 98 84 103  Diastolic 61 56 79  Pulse 100 96 77    Physical Exam Constitutional:      Appearance: She is obese.     Comments: Lethargic and clammy  Neck:     Vascular: No carotid bruit or JVD.  Cardiovascular:     Rate and Rhythm: Normal rate and regular  rhythm.     Pulses: Intact distal pulses.     Heart sounds: Normal heart sounds. No murmur heard.    No gallop.  Pulmonary:     Effort: Pulmonary effort is normal.     Breath sounds: Normal breath sounds.  Abdominal:     General: Bowel sounds are normal.     Palpations: Abdomen is soft.  Musculoskeletal:     Right lower leg: No edema.     Left lower leg: No edema.  Laboratory examination:   Recent Labs    09/30/22 0944 02/01/23 0940 02/05/23 2216  NA 140 136 135  K 3.3* 3.7 4.0  CL 108 100 102  CO2 25 27 26   GLUCOSE 92 115* 92  BUN 16 14 17   CREATININE 0.93 0.80 0.81  CALCIUM 9.2 8.7* 8.8*  GFRNONAA >60 >60 >60      Latest Ref Rng & Units 02/05/2023   10:16 PM 02/01/2023    9:40 AM 09/30/2022    9:44 AM  CMP  Glucose 70 - 99 mg/dL 92  161  92   BUN 6 - 20 mg/dL 17  14  16    Creatinine 0.44 - 1.00 mg/dL 0.96  0.45  4.09   Sodium 135 - 145 mmol/L 135  136  140   Potassium 3.5 - 5.1 mmol/L 4.0  3.7  3.3   Chloride 98 - 111 mmol/L 102  100  108   CO2 22 - 32 mmol/L 26  27  25    Calcium 8.9 - 10.3 mg/dL 8.8  8.7  9.2   Total Protein 6.5 - 8.1 g/dL 6.6  7.2    Total Bilirubin 0.3 - 1.2 mg/dL 0.3  0.7    Alkaline Phos 38 - 126 U/L 90  101    AST 15 - 41 U/L 25  22    ALT 0 - 44 U/L 24  20        Latest Ref Rng & Units 02/05/2023   10:16 PM 02/01/2023    9:40 AM 09/30/2022    9:44 AM  CBC  WBC 4.0 - 10.5 K/uL 10.6  11.0  8.8   Hemoglobin 12.0 - 15.0 g/dL 81.1  91.4  78.2   Hematocrit 36.0 - 46.0 % 34.7  40.3  41.3   Platelets 150 - 400 K/uL 219  161  230    Lipid Panel     Component Value Date/Time   CHOL 165 02/09/2016 0700   TRIG 94 02/09/2016 0700   HDL 64 02/09/2016 0700   CHOLHDL 2.6 02/09/2016 0700   VLDL 19 02/09/2016 0700   LDLCALC 82 02/09/2016 0700   HEMOGLOBIN A1C Lab Results  Component Value Date   HGBA1C 6.0 (H) 07/17/2021   MPG 125.5 07/17/2021   TSH No results for input(s): "TSH" in the last 8760 hours. BNP (last 3 results) Recent Labs     07/31/22 0345 09/30/22 0944 02/05/23 2216  BNP 494.3* 907.8* 429.6*   Cardiac Panel (last 3 results) No results for input(s): "CKTOTAL", "CKMB", "TROPONINIHS", "RELINDX" in the last 72 hours.   Medications and allergies  No Known Allergies   Current Outpatient Medications  Medication Instructions   digoxin (LANOXIN) 0.25 mg, Oral, Daily, Start 1 tab every 4 hours x 3 then one tab daily   Farxiga 10 mg, Oral, Daily before breakfast   Ferrous Sulfate (IRON PO) 1 tablet, Oral, Daily   folic acid (FOLVITE) 3 mg, Oral, Daily with lunch   ivabradine (CORLANOR) 7.5 mg, Oral, 2 times daily with meals   magnesium hydroxide (MILK OF MAGNESIA) 400 MG/5ML suspension 30 mLs, Oral, Daily PRN   magnesium oxide (MAGNESIUM-OXIDE) 400 mg, Oral, Daily   Methotrexate 25 MG/ML SOSY 1 mL, Subcutaneous, Weekly   metoprolol succinate (TOPROL-XL) 25 mg, Oral, Daily at bedtime, Take with or immediately following a meal.   midodrine (PROAMATINE) 10 mg, Oral, 3 times daily with meals PRN   Multiple Vitamin (MULTIVITAMIN WITH MINERALS) TABS tablet 1 tablet, Oral, Daily with  lunch   pantoprazole (PROTONIX) 40 mg, Oral, Daily PRN   Potassium Chloride ER 20 MEQ TBCR 20 mEq, Oral, Daily PRN, Take when you take demadex   Sertraline HCl 200 mg, Oral, Daily with lunch   spironolactone (ALDACTONE) 25 mg, Oral, Daily   tirzepatide (MOUNJARO) 10 mg, Subcutaneous, Weekly   torsemide (DEMADEX) 20 mg, Oral, Daily PRN   Radiology:  CTA chest 03/12/2022: 1. No evidence for acute pulmonary embolus. 2. Cardiac enlargement, trace bilateral pleural effusions and mild pulmonary edema compatible with CHF.   DG Chest 2 View 04/19/2021 Stable cardiomegaly. Increased density posterior to the heart border at the lung base may represent atrial enlargement or increasing pericardial effusion.    Chest x-ray 06/08/2021: 1. Interval increase in size of the cardiac silhouette which may  reflect increased cardiomegaly or increased  size of the known  pericardial effusion. Consider further evaluation with cardiac echo.  2. Tiny bilateral pleural effusions with bibasilar predominant  interstitial and airspace opacities likely reflecting pulmonary edema.    High-resolution CT scan of the chest 03/02/2023: 1. No evidence of interstitial lung disease.  2. Moderate pericardial effusion, enlarged from 09/02/2021.  3. Enlarged pulmonic trunk, indicative of pulmonary arterial hypertension.     Cardiac Studies:    Right heart catheterization and myocardial biopsy 07/17/2016: Mild pulmonary hypertension, mildly elevated pulmonary capillary wedge pressure, biopsy negative for sarcoid.   Right and left heart catheterization coronary angiography 07/12/2021: Right heart pressures: LV EDP is normal. RA: 0 mmHg RV: 19/0 mmHg PA: 21/3 mmHg, mPAP 10 mmHg PCW: 4 mmHg CO: 5.7 L/min CI: 2.7 L/min/m2   Conclusion:  Left dominant circulation No coronary artery disease Well compensated nonischemic cardiomyopathy   PET cardiac sarcoidosis 09/30/2021: 1.  Moderate perfusion defect in the lateral wall of the heart.  2.  No definite evidence of myocardial uptake to suggest active myocardial sarcoidosis.  3.  Left ventricular ejection fraction is 18%. Please correlate with cardiac ultrasound  4.  Multilevel FDG avid mediastinal and bilateral hilar lymphadenopathy most compatible with sequela of sarcoidosis.   Echocardiogram 03/19/2023: Left ventricle cavity is severely dilated. Normal left ventricular wall thickness. Hypokinetic global wall motion. Abnormal septal wall motion due to left bundle branch block. Indeterminate diastolic filling pattern due to mitral regurgitation and paced rhythm. Severely depressed LV systolic function with visual EF <20%-25%. Calculated EF 31%. Left atrial cavity is severely dilated at 49.9 ml/m^2. Structurally normal mitral valve. Annulus is dilated. There is mild restriction in the posterior MV leaflet  motion with resultant posteriorly directed moderate to severe mitral regurgitation. Dense/triangular CWD jet. Structurally normal tricuspid valve. Mild tricuspid regurgitation. Mild pulmonary hypertension. RVSP measures 39 mmHg. Pericardium is normal in appearance. Small circumferential pericardial effusion (0.5 to 1.2 cm) with clear fluids. No evidence of pleural effusion. There is no hemodynamic significance. No significant change from 08/30/2022, pericardial effusion is new.   ICD    Remote BiV ICD transmission 03/13/2023: Longevity 2 years and 6 months.  AP 0%, CRT 97 %.  Lead impedance and thresholds are stable.  No mode switch. Normal BiV ICD function.   EKG    EKG 03/13/2023: Atrially sensed, biventricular paced rhythm at rate of 91 bpm.  No further analysis.  Compared to 07/08/2022, QRS widening decreased from 216 mS to 150 mS   Assessment and plan:    Alicia Cook is a 57 y.o. 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   Since gastric sleeve surgery on 12/20/2020, she had lost 70 pounds in weight. Since gastric banding, she also has had difficulty in taking guideline directed medical therapy due to severe hypotension.  About 6 months ago she had acute decompensated heart failure with addition of [place].  She also uses midodrine on a as needed basis for orthostatic hypotension.   1. Acute on chronic systolic heart failure (HCC) Having chest pain which is clearly musculoskeletal and reproducible but extremely concerned.  She almost had near syncopal spell and was climbing our office with low blood pressure, will admit the  patient to the hospital for further stabilization.  Patient had been off of ivabradine as her heart rate number got controlled with use of ivabradine but I would like to try this again and the prescription was sent to the pharmacy however decision was made to admit the patient to the hospital as well.  I will also start her on digoxin 0.25 mg daily with bolus loading every 4 hours x 3.  - ivabradine (CORLANOR) 7.5 MG TABS tablet; Take 1 tablet (7.5 mg total) by mouth 2 (two) times daily with a meal.  Dispense: 60 tablet; Refill: 2 - digoxin (LANOXIN) 0.25 MG tablet; Take 1 tablet (0.25 mg total) by mouth daily. Start 1 tab every 4 hours x 3 then one tab daily  Dispense: 34 tablet; Refill: 2 - AMB referral to CHF clinic  2. Nonischemic cardiomyopathy [facility]) Patient has known nonischemic cardiomyopathy.  She has had MRI in the past, no infiltration from sarcoidosis.  She has BiV ICD which is functioning normally.  Unfortunately does not have physiologic parameters. - AMB referral to CHF clinic  3. Orthostatic hypotension Orthostatic hypotension is due to a combination of diabetic autonomic neuropathy, heart failure, failure to thrive.  Presently using midodrine on a as needed basis.  4. Cachexia associated with cardiac disease Patient extremely lethargic and well-appearing weight loss and lack of interest in day-to-day activities, she is now developing stage D heart failure symptoms.  I would like to get advanced heart failure team on board.  Further recommendations to follow during hospitalization, suspect she will probably need right and left heart catheterization.  I will have heart failure team to see and if they are able to perform cardiac catheterization tomorrow we can proceed with that or I can try to get this done sometime during this hospitalization.     Yates Decamp, MD, [facility] 04/10/2023, 6:12 PM Office: [phone] Fax: [phone] Pager: [phone]

## 2023-04-10 NOTE — Progress Notes (Signed)
Primary Physician/Referring:  Kennith Maes, PA-C  Patient ID: Alicia Cook, female    DOB: Jul 29, 1966, 57 y.o.   MRN: [id number]  Chief Complaint  Patient presents with   Pericardial Effusion   Chronic HFrEF (heart failure with reduced ejection fraction   Follow-up    4 weeks   HPI:    Alicia Cook  is a 57 y.o. Alicia Cook is a 57 y.o. 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   Since gastric sleeve surgery on 12/20/2020, she had lost 70 pounds in weight. Since gastric banding, she also has had difficulty in taking guideline directed medical therapy due to severe hypotension.  About 6 months ago she had acute decompensated heart failure with addition of [place].  She also uses midodrine on a as needed basis for orthostatic hypotension.   She made an urgent appointment to see me today due to chest pain, not feeling well and fatigue.  I had seen her recently after she was evaluated by pulmonary medicine and she had a CT scan which revealed moderate pericardial effusion.  However it repeated the echocardiogram which revealed only mild to moderate effusion not enough to perform pericardiocentesis and hence could not explain her symptoms of fatigue.  Chest pain is described as sharp pain in the middle of the chest and points with 1 finger.  She also states that she is extremely fatigued and tired.  She feels fullness in her chest.  She has been taking diuretics on a daily basis.  Past Medical History:  Diagnosis Date   Cataract    CHF (congestive heart failure) (HCC)    Depression    Diabetes mellitus without complication  (HCC)    Encounter for adjustment of biventricular implantable cardioverter-defibrillator (ICD) 06/13/2019   GERD (gastroesophageal reflux disease)    Hypertension    ICD: Cardiac defibrillator in situ 01/13/2015   [employer] Bi-V ICD (Dynagen X4 CRT-D) 01/13/2015 at [facility] - MRI Compatible  Scheduled Remote ICD check 4.22.20: 1 SVT/8 secs, normal function. Battery longevity is 8 years. RA pacing is 1 %, RV pacing is 99 %, and LV pacing is 99 %.   Nonischemic cardiomyopathy (HCC)    a. EF 35-40% by cath in 2011 with normal cors b. s/p ICD implantation in 12/2014 c. EF 25% by NST in 02/01/2016   Sarcoidosis    Sarcoidosis 02/08/2016   Past Surgical History:  Procedure Laterality Date   ABDOMINAL SURGERY  12/20/2020    laparoscopic sleeve gastrectomy   CARDIAC CATHETERIZATION N/A 02/08/2016   Procedure: Left Heart Cath and Coronary Angiography;  Surgeon: Corky Crafts, MD;  Location: [facility] INVASIVE CV LAB;  Service: Cardiovascular;  Laterality: N/A;   CATARACT EXTRACTION Right 12/04/2018   CESAREAN SECTION     CHOLECYSTECTOMY     INSERTION OF ICD     a. s/p dual-chamber [employer] ICD 12/2014   LAPAROSCOPIC GASTRIC SLEEVE RESECTION     PACEMAKER INSERTION     RIGHT/LEFT HEART CATH AND CORONARY ANGIOGRAPHY N/A 07/12/2021   Procedure: RIGHT/LEFT HEART CATH AND CORONARY ANGIOGRAPHY;  Surgeon: Elder Negus, MD;  Location: [facility] INVASIVE CV LAB;  Service: Cardiovascular;  Laterality: N/A;   TUBAL  LIGATION     Family History  Problem Relation Age of Onset   Heart attack Father        a. Initial onset in his 4's. S/p CABG   Heart failure Father     Social History   Tobacco Use   Smoking status: Former    Packs/day: 1.00    Years: 20.00    Additional pack years: 0.00    Total pack years: 20.00    Types: Cigarettes    Quit date: 2016    Years since quitting: 8.4   Smokeless tobacco: Never  Substance Use Topics   Alcohol use: No    Alcohol/week: 0.0  standard drinks of alcohol   Marital Status: Single  ROS  Review of Systems  Constitutional: Positive for malaise/fatigue and weight loss.  Cardiovascular:  Positive for chest pain. Negative for dyspnea on exertion, leg swelling, palpitations and syncope.  Neurological:  Positive for dizziness (rare).   Objective  Blood pressure (!) 84/56, pulse 96, resp. rate 16, height 5\' 7"  (1.702 m), weight 205 lb 6.4 oz (93.2 kg), last menstrual period 09/14/1991, SpO2 99 %.     04/10/2023    9:13 AM 03/13/2023   11:48 AM 03/13/2023   10:43 AM  Vitals with BMI  Height 5\' 7"  5\' 7"  5\' 7"   Weight 205 lbs 6 oz 207 lbs 3 oz 208 lbs 13 oz  BMI 32.16 32.44 32.69  Systolic 84 103 108  Diastolic 56 79 62  Pulse 96 77 91   Orthostatic VS for the past 72 hrs (Last 3 readings):  Orthostatic BP Patient Position BP Location Cuff Size Orthostatic Pulse  04/10/23 0923 97/69 Standing Left Arm Large 97  04/10/23 0922 102/65 Sitting Left Arm Large 95  04/10/23 0921 119/72 Supine Left Arm Large 56     Physical Exam Constitutional:      Appearance: She is obese.     Comments: She appears withdrawn, clammy.  Neck:     Vascular: No carotid bruit or JVD.  Cardiovascular:     Rate and Rhythm: Normal rate and regular rhythm.     Pulses: Intact distal pulses.     Heart sounds: No murmur heard.    No gallop.     Comments: No pulsus paradoxus Pulmonary:     Effort: Pulmonary effort is normal.     Breath sounds: Normal breath sounds. No decreased breath sounds, wheezing or rales.  Abdominal:     General: Bowel sounds are normal.     Palpations: Abdomen is soft.  Musculoskeletal:     Right lower leg: No edema.     Left lower leg: No edema.    Laboratory examination:      Latest Ref Rng & Units 02/05/2023   10:16 PM 02/01/2023    9:40 AM 09/30/2022    9:44 AM  CMP  Glucose 70 - 99 mg/dL 92  086  92   BUN 6 - 20 mg/dL 17  14  16    Creatinine 0.44 - 1.00 mg/dL 5.78  4.69  6.29   Sodium 135 - 145 mmol/L 135   136  140   Potassium 3.5 - 5.1 mmol/L 4.0  3.7  3.3   Chloride 98 - 111 mmol/L 102  100  108   CO2 22 - 32 mmol/L 26  27  25    Calcium 8.9 - 10.3 mg/dL 8.8  8.7  9.2   Total Protein 6.5 - 8.1 g/dL 6.6  7.2  Total Bilirubin 0.3 - 1.2 mg/dL 0.3  0.7    Alkaline Phos 38 - 126 U/L 90  101    AST 15 - 41 U/L 25  22    ALT 0 - 44 U/L 24  20        Latest Ref Rng & Units 02/05/2023   10:16 PM 02/01/2023    9:40 AM 09/30/2022    9:44 AM  CBC  WBC 4.0 - 10.5 K/uL 10.6  11.0  8.8   Hemoglobin 12.0 - 15.0 g/dL 16.1  09.6  04.5   Hematocrit 36.0 - 46.0 % 34.7  40.3  41.3   Platelets 150 - 400 K/uL 219  161  230    BNP    Component Value Date/Time   BNP 429.6 (H) 02/05/2023 2216   ProBNP    Component Value Date/Time   PROBNP 1,753 (H) 03/16/2023 1225   External labs:  Labs 12/15/2022: Total cholesterol 201, triglycerides 100, HDL 76, LDL 108.   Iron studies normal. Folate, B12 normal.  Hb 13.1/HCT 40.7, platelets 199.  Sodium 140, potassium 3.4, BUN 22, creatinine 0.79, EGFR 88 mL, LFTs normal.  TSH, free T3 normal.   Labs 6/23:  Sodium 142, potassium 4.0, BUN 21, creatinine 0.88, EGFR 78 mL.  Magnesium 2.3.  Hb 13.7/HCT 42.7, platelets 187.  08/23/2021:  A1c 5.6%.  Stage minimally reduced from 0.4 to.  Vitamin D 75.  Total cholesterol 216, triglycerides 109, HDL 74, LDL 120.  Non-HDL cholesterol 142.  Radiology:  CTA chest 03/12/2022: 1. No evidence for acute pulmonary embolus. 2. Cardiac enlargement, trace bilateral pleural effusions and mild pulmonary edema compatible with CHF.  DG Chest 2 View 04/19/2021 Stable cardiomegaly. Increased density posterior to the heart border at the lung base may represent atrial enlargement or increasing pericardial effusion.   Chest x-ray 06/08/2021: 1. Interval increase in size of the cardiac silhouette which may  reflect increased cardiomegaly or increased size of the known  pericardial effusion. Consider further evaluation with  cardiac echo.  2. Tiny bilateral pleural effusions with bibasilar predominant  interstitial and airspace opacities likely reflecting pulmonary edema.   High-resolution CT scan of the chest 03/02/2023: 1. No evidence of interstitial lung disease.  2. Moderate pericardial effusion, enlarged from 09/02/2021.  3. Enlarged pulmonic trunk, indicative of pulmonary arterial hypertension.    Cardiac Studies:   Right heart catheterization and myocardial biopsy 07/17/2016: Mild pulmonary hypertension, mildly elevated pulmonary capillary wedge pressure, biopsy negative for sarcoid.  Right and left heart catheterization coronary angiography 07/12/2021: Right heart pressures: LV EDP is normal. RA: 0 mmHg RV: 19/0 mmHg PA: 21/3 mmHg, mPAP 10 mmHg PCW: 4 mmHg CO: 5.7 L/min CI: 2.7 L/min/m2  Conclusion:  Left dominant circulation No coronary artery disease Well compensated nonischemic cardiomyopathy  PET cardiac sarcoidosis 09/30/2021: 1.  Moderate perfusion defect in the lateral wall of the heart.  2.  No definite evidence of myocardial uptake to suggest active myocardial sarcoidosis.  3.  Left ventricular ejection fraction is 18%. Please correlate with cardiac ultrasound  4.  Multilevel FDG avid mediastinal and bilateral hilar lymphadenopathy most compatible with sequela of sarcoidosis.  Echocardiogram 03/19/2023: Left ventricle cavity is severely dilated. Normal left ventricular wall thickness. Hypokinetic global wall motion. Abnormal septal wall motion due to left bundle branch block. Indeterminate diastolic filling pattern due to mitral regurgitation and paced rhythm. Severely depressed LV systolic function with visual EF <20%-25%. Calculated EF 31%. Left atrial cavity is severely dilated at 49.9 ml/m^2.  Structurally normal mitral valve. Annulus is dilated. There is mild restriction in the posterior MV leaflet motion with resultant posteriorly directed moderate to severe mitral regurgitation.  Dense/triangular CWD jet. Structurally normal tricuspid valve. Mild tricuspid regurgitation. Mild pulmonary hypertension. RVSP measures 39 mmHg. Pericardium is normal in appearance. Small circumferential pericardial effusion (0.5 to 1.2 cm) with clear fluids. No evidence of pleural effusion. There is no hemodynamic significance. No significant change from 08/30/2022, pericardial effusion is new.   ICD   Remote BiV ICD transmission 03/13/2023: Longevity 2 years and 6 months.  AP 0%, CRT 97 %.  Lead impedance and thresholds are stable.  No mode switch. Normal BiV ICD function.  EKG   EKG 03/13/2023: Atrially sensed, biventricular paced rhythm at rate of 91 bpm.  No further analysis.  Compared to 07/08/2022, QRS widening decreased from 216 mS to 150 mS  Allergies & Medications   No Known Allergies  Current Outpatient Medications:    digoxin (LANOXIN) 0.25 MG tablet, Take 1 tablet (0.25 mg total) by mouth daily. Start 1 tab every 4 hours x 3 then one tab daily, Disp: 34 tablet, Rfl: 2   FARXIGA 10 MG TABS tablet, Take 1 tablet (10 mg total) by mouth daily before breakfast., Disp: 90 tablet, Rfl: 1   Ferrous Sulfate (IRON PO), Take 1 tablet by mouth daily., Disp: , Rfl:    folic acid (FOLVITE) 1 MG tablet, Take 3 mg by mouth daily with lunch., Disp: , Rfl:    ivabradine (CORLANOR) 7.5 MG TABS tablet, Take 1 tablet (7.5 mg total) by mouth 2 (two) times daily with a meal., Disp: 60 tablet, Rfl: 2   magnesium hydroxide (MILK OF MAGNESIA) 400 MG/5ML suspension, Take 30 mLs by mouth daily as needed for mild constipation., Disp: , Rfl:    MAGnesium-Oxide 400 (240 Mg) MG tablet, Take 1 tablet (400 mg total) by mouth daily., Disp: 90 tablet, Rfl: 3   Methotrexate 25 MG/ML SOSY, Inject 1 mL into the skin once a week., Disp: , Rfl:    metoprolol succinate (TOPROL-XL) 25 MG 24 hr tablet, Take 1 tablet (25 mg total) by mouth at bedtime. Take with or immediately following a meal., Disp: 30 tablet, Rfl: 2    midodrine (PROAMATINE) 10 MG tablet, Take 1 tablet (10 mg total) by mouth 3 (three) times daily with meals as needed., Disp: 90 tablet, Rfl: 0   Multiple Vitamin (MULTIVITAMIN WITH MINERALS) TABS tablet, Take 1 tablet by mouth daily with lunch., Disp: , Rfl:    pantoprazole (PROTONIX) 40 MG tablet, Take 40 mg by mouth daily as needed (acid reflux)., Disp: , Rfl:    Potassium Chloride ER 20 MEQ TBCR, Take 20 mEq by mouth daily as needed. Take when you take demadex, Disp: , Rfl:    Sertraline HCl 200 MG CAPS, Take 200 mg by mouth daily with lunch., Disp: , Rfl:    spironolactone (ALDACTONE) 25 MG tablet, Take 1 tablet (25 mg total) by mouth daily., Disp: 90 tablet, Rfl: 3   tirzepatide (MOUNJARO) 10 MG/0.5ML Pen, Inject 10 mg into the skin once a week., Disp: , Rfl:    torsemide (DEMADEX) 20 MG tablet, Take 1 tablet (20 mg total) by mouth daily as needed., Disp: 90 tablet, Rfl: 3   Assessment     ICD-10-CM   1. Chronic HFrEF (heart failure with reduced ejection fraction) (HCC)  I50.22 ivabradine (CORLANOR) 7.5 MG TABS tablet    digoxin (LANOXIN) 0.25 MG tablet    AMB  referral to CHF clinic    2. Nonischemic cardiomyopathy (HCC)  I42.8 AMB referral to CHF clinic    3. Orthostatic hypotension  I95.1     4. Cachexia associated with cardiac disease  I51.9 AMB referral to CHF clinic      Meds ordered this encounter  Medications   ivabradine (CORLANOR) 7.5 MG TABS tablet    Sig: Take 1 tablet (7.5 mg total) by mouth 2 (two) times daily with a meal.    Dispense:  60 tablet    Refill:  2   digoxin (LANOXIN) 0.25 MG tablet    Sig: Take 1 tablet (0.25 mg total) by mouth daily. Start 1 tab every 4 hours x 3 then one tab daily    Dispense:  34 tablet    Refill:  2    Medications Discontinued During This Encounter  Medication Reason   dicyclomine (BENTYL) 20 MG tablet    valsartan (DIOVAN) 40 MG tablet    vitamin B-12 (CYANOCOBALAMIN) 500 MCG tablet    Vitamin D, Ergocalciferol, (DRISDOL)  1.25 MG (50000 UT) CAPS capsule    CALCIUM PO    Recommendations:   Alicia Cook is a 57 y.o. 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   Since gastric sleeve surgery on 12/20/2020, she had lost 70 pounds in weight. Since gastric banding, she also has had difficulty in taking guideline directed medical therapy due to severe hypotension.  About 6 months ago she had acute decompensated heart failure with addition of [place].  She also uses midodrine on a as needed basis for orthostatic hypotension.  1. Acute on chronic systolic heart failure (HCC) Having chest pain which is clearly musculoskeletal and reproducible but extremely concerned.  She almost had near syncopal spell and was climbing our office with low blood pressure, will admit the patient to the hospital for further stabilization.  Patient had been off of ivabradine as her heart rate number got controlled with use of ivabradine but I would like to try this again and the prescription was sent to the pharmacy however decision was made to admit the patient to the hospital as well.  I will also start her on digoxin 0.25 mg daily with bolus loading every 4 hours x 3.  - ivabradine (CORLANOR) 7.5 MG TABS tablet; Take 1 tablet (7.5 mg total) by mouth 2 (two) times daily with a meal.  Dispense: 60 tablet; Refill: 2 - digoxin (LANOXIN) 0.25 MG tablet; Take 1 tablet (0.25 mg total) by mouth daily. Start 1 tab every 4 hours x 3 then one tab daily  Dispense: 34 tablet; Refill: 2 - AMB referral to CHF clinic  2. Nonischemic cardiomyopathy [facility]) Patient has known nonischemic cardiomyopathy.   She has had MRI in the past, no infiltration from sarcoidosis.  She has BiV ICD which is functioning normally.  Unfortunately does not have physiologic parameters. - AMB referral to CHF clinic  3. Orthostatic hypotension Orthostatic hypotension is due to a combination of diabetic autonomic neuropathy, heart failure, failure to thrive.  Presently using midodrine on a as needed basis.  4. Cachexia associated with cardiac disease Patient extremely lethargic and well-appearing weight loss and lack of interest in day-to-day  activities, she is now developing stage D heart failure symptoms.  I would like to get advanced heart failure team on board.  Further recommendations to follow during hospitalization, suspect she will probably need right and left heart catheterization.  I will have heart failure team to see and if they are able to perform cardiac catheterization tomorrow we can proceed with that or I can try to get this done sometime during this hospitalization. - AMB referral to CHF clinic    Yates Decamp, MD, [facility] 04/10/2023, 9:49 AM Office: [phone] Fax: [phone] Pager: [phone]

## 2023-04-10 NOTE — Progress Notes (Signed)
Pt arrived from MD office for decreased BP and enlarging pericardial effusion. Pt AAOx4, VSS on. Complains of a headache, tylenol given. Skin intact, +1 edema BLE. Ambulates independently. Pt educated on unit policies and safety. Call bell within reach.

## 2023-04-11 ENCOUNTER — Encounter (HOSPITAL_COMMUNITY)
Admission: RE | Disposition: A | Discharge status: Home / Self Care | Payer: Self-pay | Source: Ambulatory Visit | Attending: Cardiology

## 2023-04-11 ENCOUNTER — Other Ambulatory Visit (HOSPITAL_COMMUNITY): Payer: Self-pay

## 2023-04-11 ENCOUNTER — Inpatient Hospital Stay (HOSPITAL_COMMUNITY): Payer: Medicaid Other

## 2023-04-11 DIAGNOSIS — I5084 End stage heart failure: Secondary | ICD-10-CM

## 2023-04-11 DIAGNOSIS — Z8679 Personal history of other diseases of the circulatory system: Secondary | ICD-10-CM

## 2023-04-11 DIAGNOSIS — I5023 Acute on chronic systolic (congestive) heart failure: Secondary | ICD-10-CM | POA: Diagnosis not present

## 2023-04-11 HISTORY — PX: RIGHT/LEFT HEART CATH AND CORONARY ANGIOGRAPHY: CATH118266

## 2023-04-11 LAB — BASIC METABOLIC PANEL
Anion gap: 7 (ref 5–15)
BUN: 15 mg/dL (ref 6–20)
CO2: 27 mmol/L (ref 22–32)
Calcium: 8.9 mg/dL (ref 8.9–10.3)
Chloride: 105 mmol/L (ref 98–111)
Creatinine, Ser: 0.78 mg/dL (ref 0.44–1.00)
GFR, Estimated: >60 mL/min (ref >60)
Glucose, Bld: 83 mg/dL (ref 70–99)
Potassium: 3.8 mmol/L (ref 3.5–5.1)
Sodium: 139 mmol/L (ref 135–145)

## 2023-04-11 LAB — POCT I-STAT EG7
Acid-Base Excess: 4 mmol/L — ABNORMAL HIGH (ref 0.0–2.0)
Acid-base deficit: 1 mmol/L (ref 0.0–2.0)
Bicarbonate: 24.3 mmol/L (ref 20.0–28.0)
Bicarbonate: 29.1 mmol/L — ABNORMAL HIGH (ref 20.0–28.0)
Calcium, Ion: 0.89 mmol/L — CL (ref 1.15–1.40)
Calcium, Ion: 1.13 mmol/L — ABNORMAL LOW (ref 1.15–1.40)
HCT: 36 % (ref 36.0–46.0)
HCT: 41 % (ref 36.0–46.0)
Hemoglobin: 12.2 g/dL (ref 12.0–15.0)
Hemoglobin: 13.9 g/dL (ref 12.0–15.0)
O2 Saturation: 66 %
O2 Saturation: 69 %
Potassium: 3.1 mmol/L — ABNORMAL LOW (ref 3.5–5.1)
Potassium: 3.8 mmol/L (ref 3.5–5.1)
Sodium: 142 mmol/L (ref 135–145)
Sodium: 147 mmol/L — ABNORMAL HIGH (ref 135–145)
TCO2: 26 mmol/L (ref 22–32)
TCO2: 30 mmol/L (ref 22–32)
pCO2, Ven: 40.7 mmHg — ABNORMAL LOW (ref 44–60)
pCO2, Ven: 46.5 mmHg (ref 44–60)
pH, Ven: 7.385 (ref 7.25–7.43)
pH, Ven: 7.405 (ref 7.25–7.43)
pO2, Ven: 35 mmHg (ref 32–45)
pO2, Ven: 36 mmHg (ref 32–45)

## 2023-04-11 LAB — POCT I-STAT 7, (LYTES, BLD GAS, ICA,H+H)
Acid-base deficit: 1 mmol/L (ref 0.0–2.0)
Bicarbonate: 22.7 mmol/L (ref 20.0–28.0)
Calcium, Ion: 0.84 mmol/L — CL (ref 1.15–1.40)
HCT: 35 % — ABNORMAL LOW (ref 36.0–46.0)
Hemoglobin: 11.9 g/dL — ABNORMAL LOW (ref 12.0–15.0)
O2 Saturation: 97 %
Potassium: 3.1 mmol/L — ABNORMAL LOW (ref 3.5–5.1)
Sodium: 148 mmol/L — ABNORMAL HIGH (ref 135–145)
TCO2: 24 mmol/L (ref 22–32)
pCO2 arterial: 35.1 mmHg (ref 32–48)
pH, Arterial: 7.418 (ref 7.35–7.45)
pO2, Arterial: 88 mmHg (ref 83–108)

## 2023-04-11 LAB — BRAIN NATRIURETIC PEPTIDE: B Natriuretic Peptide: 571.3 pg/mL — ABNORMAL HIGH (ref 0.0–100.0)

## 2023-04-11 SURGERY — RIGHT/LEFT HEART CATH AND CORONARY ANGIOGRAPHY
Anesthesia: LOCAL

## 2023-04-11 MED ORDER — ACETAMINOPHEN 325 MG PO TABS
650.0000 mg | ORAL_TABLET | ORAL | Status: DC | PRN
  Administered 2023-04-11: 650 mg via ORAL

## 2023-04-11 MED ORDER — VERAPAMIL HCL 2.5 MG/ML IV SOLN
INTRAVENOUS | Status: AC
  Filled 2023-04-11: qty 2

## 2023-04-11 MED ORDER — SODIUM CHLORIDE 0.9% FLUSH
3.0000 mL | Freq: Two times a day (BID) | INTRAVENOUS | Status: DC
  Administered 2023-04-11 (×2): 3 mL via INTRAVENOUS

## 2023-04-11 MED ORDER — SODIUM CHLORIDE 0.9% FLUSH: 3.0000 mL | Freq: Two times a day (BID) | INTRAVENOUS | Status: DC

## 2023-04-11 MED ORDER — MIDAZOLAM HCL 2 MG/2ML IJ SOLN
INTRAMUSCULAR | Status: DC | PRN
  Administered 2023-04-11 (×2): 1 mg via INTRAVENOUS

## 2023-04-11 MED ORDER — LIDOCAINE HCL (PF) 1 % IJ SOLN
INTRAMUSCULAR | Status: DC | PRN
  Administered 2023-04-11: 5 mL

## 2023-04-11 MED ORDER — POTASSIUM CHLORIDE CRYS ER 20 MEQ PO TBCR
20.0000 meq | EXTENDED_RELEASE_TABLET | Freq: Once | ORAL | Status: AC
  Administered 2023-04-11: 20 meq via ORAL
  Filled 2023-04-11: qty 1

## 2023-04-11 MED ORDER — ENOXAPARIN SODIUM 40 MG/0.4ML IJ SOSY
40.0000 mg | PREFILLED_SYRINGE | INTRAMUSCULAR | Status: DC
  Administered 2023-04-12: 40 mg via SUBCUTANEOUS
  Filled 2023-04-11: qty 0.4

## 2023-04-11 MED ORDER — HYDRALAZINE HCL 20 MG/ML IJ SOLN: 10.0000 mg | INTRAMUSCULAR | Status: AC | PRN

## 2023-04-11 MED ORDER — HEPARIN (PORCINE) IN NACL 1000-0.9 UT/500ML-% IV SOLN
INTRAVENOUS | Status: DC | PRN
  Administered 2023-04-11 (×2): 500 mL

## 2023-04-11 MED ORDER — HEPARIN SODIUM (PORCINE) 1000 UNIT/ML IJ SOLN
INTRAMUSCULAR | Status: AC
  Filled 2023-04-11: qty 10

## 2023-04-11 MED ORDER — LIDOCAINE HCL (PF) 1 % IJ SOLN
INTRAMUSCULAR | Status: AC
  Filled 2023-04-11: qty 30

## 2023-04-11 MED ORDER — HEPARIN SODIUM (PORCINE) 1000 UNIT/ML IJ SOLN
INTRAMUSCULAR | Status: DC | PRN
  Administered 2023-04-11: 4000 [IU] via INTRAVENOUS

## 2023-04-11 MED ORDER — LABETALOL HCL 5 MG/ML IV SOLN: 10.0000 mg | INTRAVENOUS | Status: AC | PRN

## 2023-04-11 MED ORDER — VERAPAMIL HCL 2.5 MG/ML IV SOLN
INTRAVENOUS | Status: DC | PRN
  Administered 2023-04-11: 10 mL via INTRA_ARTERIAL

## 2023-04-11 MED ORDER — ASPIRIN 81 MG PO CHEW: 81.0000 mg | CHEWABLE_TABLET | ORAL | Status: DC

## 2023-04-11 MED ORDER — SODIUM CHLORIDE 0.9 % IV SOLN: 250.0000 mL | INTRAVENOUS | Status: DC | PRN

## 2023-04-11 MED ORDER — SODIUM CHLORIDE 0.9% FLUSH: 3.0000 mL | INTRAVENOUS | Status: DC | PRN

## 2023-04-11 MED ORDER — FENTANYL CITRATE (PF) 100 MCG/2ML IJ SOLN
INTRAMUSCULAR | Status: DC | PRN
  Administered 2023-04-11: 25 ug via INTRAVENOUS

## 2023-04-11 MED ORDER — MIDAZOLAM HCL 2 MG/2ML IJ SOLN
INTRAMUSCULAR | Status: AC
  Filled 2023-04-11: qty 2

## 2023-04-11 MED ORDER — SODIUM CHLORIDE 0.9 % IV SOLN: INTRAVENOUS | Status: DC

## 2023-04-11 MED ORDER — IOHEXOL 350 MG/ML SOLN
INTRAVENOUS | Status: DC | PRN
  Administered 2023-04-11: 30 mL

## 2023-04-11 MED ORDER — FENTANYL CITRATE (PF) 100 MCG/2ML IJ SOLN
INTRAMUSCULAR | Status: AC
  Filled 2023-04-11: qty 2

## 2023-04-11 MED ORDER — ONDANSETRON HCL 4 MG/2ML IJ SOLN
4.0000 mg | Freq: Four times a day (QID) | INTRAMUSCULAR | Status: DC | PRN
  Filled 2023-04-11: qty 2

## 2023-04-11 SURGICAL SUPPLY — 10 items
CATH 5FR JL3.5 JR4 ANG PIG MP (CATHETERS) IMPLANT
CATH BALLN WEDGE 5F 110CM (CATHETERS) IMPLANT
DEVICE RAD TR BAND REGULAR (VASCULAR PRODUCTS) IMPLANT
GLIDESHEATH SLEND SS 6F .021 (SHEATH) IMPLANT
GUIDEWIRE INQWIRE 1.5J.035X260 (WIRE) IMPLANT
INQWIRE 1.5J .035X260CM (WIRE) ×1
KIT HEART LEFT (KITS) IMPLANT
PACK CARDIAC CATHETERIZATION (CUSTOM PROCEDURE TRAY) ×1 IMPLANT
SHEATH GLIDE SLENDER 4/5FR (SHEATH) IMPLANT
TRANSDUCER W/STOPCOCK (MISCELLANEOUS) ×1 IMPLANT

## 2023-04-11 NOTE — Interval H&P Note (Signed)
History and Physical Interval Note:  04/11/2023 10:50 AM  Alicia Cook  has presented today for surgery, with the diagnosis of HF.  The various methods of treatment have been discussed with the patient and family. After consideration of risks, benefits and other options for treatment, the patient has consented to  Procedure(s): RIGHT/LEFT HEART CATH AND CORONARY ANGIOGRAPHY (N/A) as a surgical intervention.  The patient's history has been reviewed, patient examined, no change in status, stable for surgery.  I have reviewed the patient's chart and labs.  Questions were answered to the patient's satisfaction.     [name]

## 2023-04-11 NOTE — Consult Note (Addendum)
Advanced Heart Failure Team Consult Note   Primary Physician: Kennith Maes, PA-C PCP-Cardiologist:  Dr. Jacinto Halim   Reason for Consultation: acute on chronic systolic heart failure, felt to be end-stage   HPI:    Alicia Cook is seen today for evaluation of acute on chronic systolic heart failure, felt to be end-stage at the request of Dr. Jacinto Halim, cardiology.   57 y/o AAF w/ chronic systolic heart failure due to NICM since 2017. Echo then showed EF 25-30, normal RV. LHC in 2017 and 2022 showed normal coronaries. S/p BiV ICD. Also w/ pulmonary sarcoid. No cardiac evolvement, had negative myocardial biopsy in 2017 and negative cardiac PET in 2022. Also h/o T2DM, HTN, OSA and obesity, s/p gastric sleeve surgery 11/2020.   She has struggled w/ worsening HF w/ worsening fatigue/ exercise tolerance, inability to tolerate GDMT and continued wt loss/ cardiac cachexia and felt to be end-stage. Most recent echo 03/19/23 showed LVEF 20-25%, severe LAE w/ mild restriction of posterior MV leaflet w/ resultant posteriorly directed mod-severe MR, mild PH w/ estimated RVSP 39 mmHg. RV moderately reduced.   AHF team consulted for RHC and evaluation for advanced therapies.   She is currently resting in bed. No current resting dyspnea. Notes significant exertional fatigue w/ ADLs, NYHA Class IIIb. BP soft w/ narrow pulse pressure, 93/75.    Cardiac Studies   Echo 03/19/23 Echocardiogram 03/19/2023:  Left ventricle cavity is severely dilated. Normal left ventricular wall  thickness. Hypokinetic global wall motion. Abnormal septal wall motion due  to left bundle branch block. Indeterminate diastolic filling pattern due  to mitral regurgitation and paced rhythm. Severely depressed LV systolic  function with visual EF <20%-25%. Calculated EF 31%.  Left atrial cavity is severely dilated at 49.9 ml/m^2.  Structurally normal mitral valve. Annulus is dilated. There is mild  restriction in the posterior MV  leaflet motion with resultant posteriorly  directed moderate to severe mitral regurgitation. Dense/triangular CWD  jet.  Structurally normal tricuspid valve. Mild tricuspid regurgitation. Mild  pulmonary hypertension. RVSP measures 39 mmHg.  Pericardium is normal in appearance. Small circumferential pericardial  effusion (0.5 to 1.2 cm) with clear fluids. No evidence of pleural  effusion. There is no hemodynamic significance.  No significant change from 08/30/2022, pericardial effusion is new.   Right heart catheterization and myocardial biopsy 07/17/2016: Mild pulmonary hypertension, mildly elevated pulmonary capillary wedge pressure, biopsy negative for sarcoid.   Right and left heart catheterization coronary angiography 07/12/2021: Right heart pressures: LV EDP is normal. RA: 0 mmHg RV: 19/0 mmHg PA: 21/3 mmHg, mPAP 10 mmHg PCW: 4 mmHg CO: 5.7 L/min CI: 2.7 L/min/m2   Conclusion:  Left dominant circulation No coronary artery disease Well compensated nonischemic cardiomyopathy   PET cardiac sarcoidosis 09/30/2021: 1.  Moderate perfusion defect in the lateral wall of the heart.  2.  No definite evidence of myocardial uptake to suggest active myocardial sarcoidosis.  3.  Left ventricular ejection fraction is 18%. Please correlate with cardiac ultrasound  4.  Multilevel FDG avid mediastinal and bilateral hilar lymphadenopathy most compatible with sequela of sarcoidosis.      Review of Systems: [y] = yes, [ ]  = no   General: Weight gain [ ] ; Weight loss [ ] ; Anorexia [ ] ; Fatigue [ ] ; Fever [ ] ; Chills [ ] ; Weakness [ ]   Cardiac: Chest pain/pressure [ ] ; Resting SOB [ ] ; Exertional SOB [ ] ; Orthopnea [ ] ; Pedal Edema [ ] ; Palpitations [ ] ; Syncope [ ] ; Presyncope [ ] ;  Paroxysmal nocturnal dyspnea[ ]   Pulmonary: Cough [ ] ; Wheezing[ ] ; Hemoptysis[ ] ; Sputum [ ] ; Snoring [ ]   GI: Vomiting[ ] ; Dysphagia[ ] ; Melena[ ] ; Hematochezia [ ] ; Heartburn[ ] ; Abdominal pain [ ] ; Constipation [  ]; Diarrhea [ ] ; BRBPR [ ]   GU: Hematuria[ ] ; Dysuria [ ] ; Nocturia[ ]   Vascular: Pain in legs with walking [ ] ; Pain in feet with lying flat [ ] ; Non-healing sores [ ] ; Stroke [ ] ; TIA [ ] ; Slurred speech [ ] ;  Neuro: Headaches[ ] ; Vertigo[ ] ; Seizures[ ] ; Paresthesias[ ] ;Blurred vision [ ] ; Diplopia [ ] ; Vision changes [ ]   Ortho/Skin: Arthritis [ ] ; Joint pain [ ] ; Muscle pain [ ] ; Joint swelling [ ] ; Back Pain [ ] ; Rash [ ]   Psych: Depression[ ] ; Anxiety[ ]   Heme: Bleeding problems [ ] ; Clotting disorders [ ] ; Anemia [ ]   Endocrine: Diabetes [ ] ; Thyroid dysfunction[ ]   Home Medications Prior to Admission medications   Medication Sig Start Date End Date Taking? Authorizing Provider  digoxin (LANOXIN) 0.25 MG tablet Take 1 tablet (0.25 mg total) by mouth daily. Start 1 tab every 4 hours x 3 then one tab daily 04/10/23  Yes Yates Decamp, MD  FARXIGA 10 MG TABS tablet Take 1 tablet (10 mg total) by mouth daily before breakfast. 03/13/23  Yes Yates Decamp, MD  Ferrous Sulfate (IRON PO) Take 1 tablet by mouth daily.   Yes [provider]  folic acid (FOLVITE) 1 MG tablet Take 3 mg by mouth daily with lunch.   Yes [provider]  magnesium hydroxide (MILK OF MAGNESIA) 400 MG/5ML suspension Take 30 mLs by mouth daily as needed for mild constipation.   Yes [provider]  MAGnesium-Oxide 400 (240 Mg) MG tablet Take 1 tablet (400 mg total) by mouth daily. 05/15/22  Yes Yates Decamp, MD  Methotrexate 25 MG/ML SOSY Inject 1 mL into the skin once a week.   Yes [provider]  metoprolol succinate (TOPROL-XL) 25 MG 24 hr tablet Take 1 tablet (25 mg total) by mouth at bedtime. Take with or immediately following a meal. 03/13/23 06/11/23 Yes Yates Decamp, MD  midodrine (PROAMATINE) 10 MG tablet Take 1 tablet (10 mg total) by mouth 3 (three) times daily with meals as needed. Patient taking differently: Take 10 mg by mouth 3 (three) times daily with meals as needed (For blood  pressure). 04/18/22  Yes Yates Decamp, MD  Multiple Vitamin (MULTIVITAMIN WITH MINERALS) TABS tablet Take 1 tablet by mouth daily with lunch.   Yes [provider]  pantoprazole (PROTONIX) 40 MG tablet Take 40 mg by mouth daily as needed (acid reflux). 01/23/20  Yes [provider]  Potassium Chloride ER 20 MEQ TBCR Take 20 mEq by mouth daily as needed. Take when you take demadex Patient taking differently: Take 20 mEq by mouth daily. 07/19/21  Yes Rodolph Bong, MD  sertraline (ZOLOFT) 100 MG tablet Take 100 mg by mouth daily.   Yes [provider]  spironolactone (ALDACTONE) 25 MG tablet Take 1 tablet (25 mg total) by mouth daily. 04/03/22 04/10/23 Yes Cantwell, Celeste C, PA-C  ivabradine (CORLANOR) 7.5 MG TABS tablet Take 1 tablet (7.5 mg total) by mouth 2 (two) times daily with a meal. Patient not taking: Reported on 04/10/2023 04/10/23   Yates Decamp, MD  tirzepatide [facility]) 10 MG/0.5ML Pen Inject 10 mg into the skin once a week. 08/01/22   [provider]  torsemide (DEMADEX) 20 MG tablet Take 1  tablet (20 mg total) by mouth daily as needed. Patient taking differently: Take 20 mg by mouth daily as needed (For fluid). 02/27/23 02/22/24  Yates Decamp, MD    Past Medical History: Past Medical History:  Diagnosis Date   Cataract    CHF (congestive heart failure) (HCC)    Depression    Diabetes mellitus without complication (HCC)    Encounter for adjustment of biventricular implantable cardioverter-defibrillator (ICD) 06/13/2019   GERD (gastroesophageal reflux disease)    Hypertension    ICD: Cardiac defibrillator in situ 01/13/2015   [employer] Bi-V ICD (Dynagen X4 CRT-D) 01/13/2015 at [facility] - MRI Compatible  Scheduled Remote ICD check 4.22.20: 1 SVT/8 secs, normal function. Battery longevity is 8 years. RA pacing is 1 %, RV pacing is 99 %, and LV pacing is 99 %.   Nonischemic cardiomyopathy (HCC)    a. EF 35-40% by cath in 2011 with normal  cors b. s/p ICD implantation in 12/2014 c. EF 25% by NST in 02/01/2016   Sarcoidosis    Sarcoidosis 02/08/2016    Past Surgical History: Past Surgical History:  Procedure Laterality Date   ABDOMINAL SURGERY  12/20/2020    laparoscopic sleeve gastrectomy   CARDIAC CATHETERIZATION N/A 02/08/2016   Procedure: Left Heart Cath and Coronary Angiography;  Surgeon: Corky Crafts, MD;  Location: [facility] INVASIVE CV LAB;  Service: Cardiovascular;  Laterality: N/A;   CATARACT EXTRACTION Right 12/04/2018   CESAREAN SECTION     CHOLECYSTECTOMY     INSERTION OF ICD     a. s/p dual-chamber [employer] ICD 12/2014   LAPAROSCOPIC GASTRIC SLEEVE RESECTION     PACEMAKER INSERTION     RIGHT/LEFT HEART CATH AND CORONARY ANGIOGRAPHY N/A 07/12/2021   Procedure: RIGHT/LEFT HEART CATH AND CORONARY ANGIOGRAPHY;  Surgeon: Elder Negus, MD;  Location: [facility] INVASIVE CV LAB;  Service: Cardiovascular;  Laterality: N/A;   TUBAL LIGATION      Family History: Family History  Problem Relation Age of Onset   Heart attack Father        a. Initial onset in his 55's. S/p CABG   Heart failure Father     Social History: Social History   Socioeconomic History   Marital status: Single    Spouse name: Not on file   Number of children: 5   Years of education: Not on file   Highest education level: Not on file  Occupational History   Not on file  Tobacco Use   Smoking status: Former    Packs/day: 1.00    Years: 20.00    Additional pack years: 0.00    Total pack years: 20.00    Types: Cigarettes    Quit date: 2016    Years since quitting: 8.4   Smokeless tobacco: Never  Vaping Use   Vaping Use: Never used  Substance and Sexual Activity   Alcohol use: No    Alcohol/week: 0.0 standard drinks of alcohol   Drug use: No   Sexual activity: Not on file  Other Topics Concern   Not on file  Social History Narrative   Not on file   Social Determinants of Health   Financial Resource Strain: Not  on file  Food Insecurity: No Food Insecurity (04/10/2023)   Hunger Vital Sign    Worried About Running Out of Food in the Last Year: Never true    Ran Out of Food in the Last Year: Never true  Transportation Needs: No Transportation Needs (04/10/2023)  PRAPARE - Administrator, Civil Service (Medical): No    Lack of Transportation (Non-Medical): No  Physical Activity: Not on file  Stress: Not on file  Social Connections: Not on file    Allergies:  No Known Allergies  Objective:    Vital Signs:   Temp:  [97.8 F (36.6 C)-97.9 F (36.6 C)] 97.9 F (36.6 C) (06/12 0750) Pulse Rate:  [72-100] 78 (06/12 0750) Resp:  [18-29] 18 (06/12 0750) BP: (93-98)/(61-75) 93/75 (06/12 0750) SpO2:  [98 %-100 %] 98 % (06/12 0750) Weight:  [92.4 kg-92.7 kg] 92.4 kg (06/12 0457)    Weight change: Filed Weights   04/10/23 1719 04/11/23 0457  Weight: 92.7 kg 92.4 kg    Intake/Output:   Intake/Output Summary (Last 24 hours) at 04/11/2023 1610 Last data filed at 04/11/2023 0753 Gross per 24 hour  Intake --  Output 900 ml  Net -900 ml      Physical Exam    General:  fatigued appearing AAF. No resp difficulty HEENT: normal Neck: supple. JVP not elevated. Carotids 2+ bilat; no bruits. No lymphadenopathy or thyromegaly appreciated. Cor: PMI nondisplaced. Regular rate & rhythm. No rubs, gallops or murmurs. Lungs: faint bibasilar crackles R> L  Abdomen: soft, nontender, nondistended. No hepatosplenomegaly. No bruits or masses. Good bowel sounds. Extremities: no cyanosis, clubbing, rash, edema Neuro: alert & orientedx3, cranial nerves grossly intact. moves all 4 extremities w/o difficulty. Affect pleasant   Telemetry   V paced 80s   EKG    No EKG this admit, Last EKG 5/24 showed A-senced V paced, 91 bpm, QRS 150 ms   Labs   Basic Metabolic Panel: Recent Labs  Lab 04/10/23 1815 04/11/23 0015  NA 137 139  K 4.3 3.8  CL 102 105  CO2 26 27  GLUCOSE 134* 83  BUN 14  15  CREATININE 0.91 0.78  CALCIUM 9.5 8.9  MG 2.2  --     Liver Function Tests: Recent Labs  Lab 04/10/23 1815  AST 25  ALT 19  ALKPHOS 115  BILITOT 0.6  PROT 7.2  ALBUMIN 3.7   No results for input(s): "LIPASE", "AMYLASE" in the last 168 hours. No results for input(s): "AMMONIA" in the last 168 hours.  CBC: Recent Labs  Lab 04/10/23 1815  WBC 8.0  HGB 13.8  HCT 43.6  MCV 90.5  PLT 177    Cardiac Enzymes: No results for input(s): "CKTOTAL", "CKMB", "CKMBINDEX", "TROPONINI" in the last 168 hours.  BNP: BNP (last 3 results) Recent Labs    02/05/23 2216 04/10/23 1815 04/11/23 0015  BNP 429.6* 633.1* 571.3*    ProBNP (last 3 results) Recent Labs    03/16/23 1225  PROBNP 1,753*     CBG: No results for input(s): "GLUCAP" in the last 168 hours.  Coagulation Studies: No results for input(s): "LABPROT", "INR" in the last 72 hours.   Imaging   No results found.   Medications:     Current Medications:  dapagliflozin propanediol  10 mg Oral QAC breakfast   [START ON 04/12/2023] digoxin  0.25 mg Oral Daily   ferrous sulfate  325 mg Oral Daily   folic acid  3 mg Oral Q lunch   furosemide  20 mg Intravenous Daily   heparin  5,000 Units Subcutaneous Q8H   ivabradine  7.5 mg Oral BID WC   magnesium oxide  400 mg Oral Daily   metoprolol succinate  25 mg Oral QHS   multivitamin with minerals  1  tablet Oral Q lunch   sertraline  200 mg Oral Q lunch   sodium chloride flush  3 mL Intravenous Q12H   spironolactone  25 mg Oral Daily    Infusions:  sodium chloride        Patient Profile  57 y/o AAF w/ chronic systolic heart failure due to NICM since 2017, LBBB s/p BiV ICD. Also w/ pulmonary sarcoid. No cardiac evolvement, had negative myocardial biopsy in 2017 and negative cardiac PET in 2022. Also h/o T2DM, HTN, OSA and obesity, s/p gastric sleeve surgery 11/2020. Admitted w/ a/c CHF, now felt to be end-stage. AHF team consulted to evaluate for advanced  therapies.   Assessment/Plan   1. Acute on Chronic, End-Staged, Systolic Heart Failure/ Biventricular Dysfunction  - NICM since 2017. LHCs 2017 and 2022 showed no CAD - Echo 2017 EF 25-30%, normal RV - LBBB, s/p BiV ICD  - Echo 08/2022 EF 20-25%, mod-severe MR, RV mod reduced  - Echo 02/2023 EF 20-25%, mod-severe MR - Cardiac PET (-) for sarcoid - has struggled w/ worsening HF w/ worsening fatigue/ exercise tolerance, inability to tolerate GDMT and continued wt loss/ cardiac cachexia and felt to be end-stage, NYHA class IIIb  - Needs evaluation for advanced therapies. Plan RHC today followed by referral to [facility] for transplant eval vs LVAD  - Farxiga 10 mg daily  - spironolactone 25 mg daily  - Toprol XL 25 mg daily  - digoxin 0.25 mg daily  - ivabradine 7.5 mg bid  - BP too soft for ARB/ARNi - Lasix 20 mg IV   2. Pulmonary Sarcoid  - no cardiac involvement, negative myocardial biopsy in 2017 and negative cardiac PET 2022   3. Type 2DM  - recommend repeating Hgb A1c, last on file 2022 (6.0) - on Farxiga   4. HTN - controlled on current regimen - GDMT per above    Length of Stay: 1  Brittainy Simmons, PA-C  04/11/2023, 8:22 AM  Advanced Heart Failure Team Pager [phone] (M-F; 7a - 5p)  Please contact [facility] for night-coverage after hours (4p -7a ) and weekends on [email or web address]   Patient seen and examined with the above-signed Advanced Practice Provider and/or Housestaff. I personally reviewed laboratory data, imaging studies and relevant notes. I independently examined the patient and formulated the important aspects of the plan. I have edited the note to reflect any of my changes or salient points. I have personally discussed the plan with the patient and/or family.  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 Has LBBB but EF not improved with  CRT  Since her weight loss surgery in 2022 has lost 60-80 pounds. Now struggling to tolerate GDMT. At baseline was working 2 jobs but over past week or two much more symptomatic. NYHA IIIB-IV.  Echo with EF < 20 RV mildly down .Marland Kitchen  General:  Well appearing. No resp difficulty HEENT: normal Neck: supple. no JVD. Carotids 2+ bilat; no bruits. No lymphadenopathy or thryomegaly appreciated. Cor:  Regular rate & rhythm. No rubs, gallops or murmurs. Lungs: clear Abdomen: soft, nontender, nondistended. No hepatosplenomegaly. No bruits or masses. Good bowel sounds. Extremities: no cyanosis, clubbing, rash, edema Neuro: alert & orientedx3, cranial nerves grossly intact. moves all 4 extremities w/o difficulty. Affect pleasant  At baseline seems more NYHA II-III but now progressing to NYHA IIIb-IV with intolerance to GDMT.   I have  d/w Dr. Jacinto Halim and agree that she is likely nearing time for advanced therapies.   Will plan repeat echo and R/L cath. If cath numbers reasonably preserved will need CPX to further risk stratify.   Arvilla Meres, MD  11:35 AM

## 2023-04-11 NOTE — Progress Notes (Signed)
Heart Failure Navigator Progress Note  Assessed for Heart & Vascular TOC clinic readiness.  Patient does not meet criteria due to [facility] patient.   Navigator will sign off at this time.   Krystin Keeven, BSN, RN Heart Failure Nurse Navigator Secure Chat Only   

## 2023-04-11 NOTE — Progress Notes (Addendum)
Patient also has hypokalemia, will be replaced and closely followed

## 2023-04-11 NOTE — Progress Notes (Signed)
Echo attempted at 1014. Pt is going to the cath lab. Will attempt when pt returns to unit.

## 2023-04-11 NOTE — Progress Notes (Signed)
Progress Note  Patient Name: Alicia Cook MRN: [id number] DOB: April 18, 1966 Date of Encounter: 04/11/2023  Attending physician: Yates Decamp, MD Primary care provider: Kennith Maes, PA-C Primary Cardiologist: Dr. Yates Decamp  Subjective: Alicia Cook is a 57 y.o. African-American female who was seen and examined at bedside  Feels chest congestion. Denies chest pain Resting in bed comfortably  Objective: Vital Signs in the last 24 hours: Temp:  [97.8 F (36.6 C)-97.9 F (36.6 C)] 97.9 F (36.6 C) (06/12 0750) Pulse Rate:  [72-100] 78 (06/12 0750) Resp:  [18-29] 18 (06/12 0750) BP: (93-98)/(61-75) 93/75 (06/12 0750) SpO2:  [98 %-100 %] 100 % (06/12 1035) Weight:  [92.4 kg-92.7 kg] 92.4 kg (06/12 0457)  Intake/Output:  Intake/Output Summary (Last 24 hours) at 04/11/2023 1054 Last data filed at 04/11/2023 0900 Gross per 24 hour  Intake 0 ml  Output 900 ml  Net -900 ml    Net IO Since Admission: -900 mL [04/11/23 1054]  Weights:     04/11/2023    4:57 AM 04/10/2023    5:19 PM 04/10/2023    9:13 AM  Last 3 Weights  Weight (lbs) 203 lb 11.2 oz 204 lb 5.9 oz 205 lb 6.4 oz  Weight (kg) 92.398 kg 92.7 kg 93.169 kg      Telemetry:  Overnight telemetry shows ventricular paced rhythm with occasional PVCs, which I personally reviewed.   Physical examination: PHYSICAL EXAM: Vitals:   04/10/23 1956 04/11/23 0457 04/11/23 0750 04/11/23 1035  BP:  97/68 93/75   Pulse:  72 78   Resp: (!) 22 (!) 26 18   Temp:  97.8 F (36.6 C) 97.9 F (36.6 C)   TempSrc:  Oral Oral   SpO2:  100% 98% 100%  Weight:  92.4 kg    Height:        Physical Exam  Constitutional: No distress.  Age appropriate, hemodynamically stable.   Neck: No JVD present.  Cardiovascular: Normal rate, regular rhythm, S1 normal, S2 normal, intact distal pulses and normal pulses. Exam reveals no gallop, no S3 and no S4.  No murmur heard. Pulses:      Dorsalis pedis pulses are 2+ on the right side and 2+ on the  left side.       Posterior tibial pulses are 2+ on the right side and 2+ on the left side.  Bilateral lower extremity swelling is not present, legs are warm to touch.  Pulmonary/Chest: Effort normal and breath sounds normal. No stridor. She has no wheezes. At the bases bilaterally She exhibits no tenderness.  Abdominal: Soft. Bowel sounds are normal. She exhibits no distension. There is no abdominal tenderness.  Abdominal obesity  Musculoskeletal:        General: No edema.     Cervical back: Neck supple.  Neurological: She is alert and oriented to person, place, and time. She has intact cranial nerves (2-12).  Skin: Skin is warm and moist.   Lab Results: Chemistry Recent Labs  Lab 04/10/23 1815 04/11/23 0015  NA 137 139  K 4.3 3.8  CL 102 105  CO2 26 27  GLUCOSE 134* 83  BUN 14 15  CREATININE 0.91 0.78  CALCIUM 9.5 8.9  PROT 7.2  --   ALBUMIN 3.7  --   AST 25  --   ALT 19  --   ALKPHOS 115  --   BILITOT 0.6  --   GFRNONAA >60 >60  ANIONGAP 9 7    Hematology Recent Labs  Lab 04/10/23 1815  WBC 8.0  RBC 4.82  HGB 13.8  HCT 43.6  MCV 90.5  MCH 28.6  MCHC 31.7  RDW 13.1  PLT 177   High Sensitivity Troponin:   Recent Labs  Lab 04/10/23 1815 04/10/23 1951  TROPONINIHS 14 14     Cardiac EnzymesNo results for input(s): "TROPONINI" in the last 168 hours. No results for input(s): "TROPIPOC" in the last 168 hours.  BNP Recent Labs  Lab 04/10/23 1815 04/11/23 0015  BNP 633.1* 571.3*    DDimer No results for input(s): "DDIMER" in the last 168 hours.  Hemoglobin A1c:  Lab Results  Component Value Date   HGBA1C 6.0 (H) 07/17/2021   MPG 125.5 07/17/2021   TSH  Recent Labs    04/10/23 1815  TSH 1.110   Lipid Panel  Lab Results  Component Value Date   CHOL 165 02/09/2016   HDL 64 02/09/2016   LDLCALC 82 02/09/2016   TRIG 94 02/09/2016   CHOLHDL 2.6 02/09/2016   Drugs of Abuse  No results found for: "LABOPIA", "COCAINSCRNUR", "LABBENZ", "AMPHETMU",  "THCU", "LABBARB"    Imaging: No results found.  RADIOLOGY: High-resolution CT scan of the chest 03/02/2023: 1. No evidence of interstitial lung disease.  2. Moderate pericardial effusion, enlarged from 09/02/2021.  3. Enlarged pulmonic trunk, indicative of pulmonary arterial hypertension.   CARDIAC DATABASE: EKG: Mar 13, 2023: Atrial sensed ventricular paced rhythm  Echocardiogram: 03/19/2023: Left ventricle cavity is severely dilated. Normal left ventricular wall thickness. Hypokinetic global wall motion. Abnormal septal wall motion due to left bundle branch block. Indeterminate diastolic filling pattern due to mitral regurgitation and paced rhythm. Severely depressed LV systolic function with visual EF <20%-25%. Calculated EF 31%. Left atrial cavity is severely dilated at 49.9 ml/m^2. Structurally normal mitral valve. Annulus is dilated. There is mild restriction in the posterior MV leaflet motion with resultant posteriorly directed moderate to severe mitral regurgitation. Dense/triangular CWD jet. Structurally normal tricuspid valve. Mild tricuspid regurgitation. Mild pulmonary hypertension. RVSP measures 39 mmHg. Pericardium is normal in appearance. Small circumferential pericardial effusion (0.5 to 1.2 cm) with clear fluids. No evidence of pleural effusion. There is no hemodynamic significance. No significant change from 08/30/2022, pericardial effusion is new.  Stress test: None  Heart catheterization: Right heart catheterization and myocardial biopsy 07/17/2016: Mild pulmonary hypertension, mildly elevated pulmonary capillary wedge pressure, biopsy negative for sarcoid.   Right and left heart catheterization coronary angiography 07/12/2021: Right heart pressures: LV EDP is normal. RA: 0 mmHg RV: 19/0 mmHg PA: 21/3 mmHg, mPAP 10 mmHg PCW: 4 mmHg CO: 5.7 L/min CI: 2.7 L/min/m2   Conclusion:  Left dominant circulation No coronary artery disease Well compensated  nonischemic cardiomyopathy  PET cardiac sarcoidosis 09/30/2021: 1.  Moderate perfusion defect in the lateral wall of the heart.  2.  No definite evidence of myocardial uptake to suggest active myocardial sarcoidosis.  3.  Left ventricular ejection fraction is 18%. Please correlate with cardiac ultrasound  4.  Multilevel FDG avid mediastinal and bilateral hilar lymphadenopathy most compatible with sequela of sarcoidosis.   Remote BiV ICD transmission 03/13/2023: Longevity 2 years and 6 months.  AP 0%, CRT 97 %.  Lead impedance and thresholds are stable.  No mode switch. Normal BiV ICD function.  Scheduled Meds:  aspirin  81 mg Oral Pre-Cath   [MAR Hold] dapagliflozin propanediol  10 mg Oral QAC breakfast   [MAR Hold] digoxin  0.25 mg Oral Daily   [MAR Hold] ferrous sulfate  325 mg Oral Daily   [  MAR Hold] folic acid  3 mg Oral Q lunch   [MAR Hold] furosemide  20 mg Intravenous Daily   [MAR Hold] heparin  5,000 Units Subcutaneous Q8H   [MAR Hold] ivabradine  7.5 mg Oral BID WC   [MAR Hold] magnesium oxide  400 mg Oral Daily   [MAR Hold] metoprolol succinate  25 mg Oral QHS   [MAR Hold] multivitamin with minerals  1 tablet Oral Q lunch   [MAR Hold] sertraline  200 mg Oral Q lunch   [MAR Hold] sodium chloride flush  3 mL Intravenous Q12H   sodium chloride flush  3 mL Intravenous Q12H   [MAR Hold] spironolactone  25 mg Oral Daily    Continuous Infusions:  [MAR Hold] sodium chloride     sodium chloride     sodium chloride 10 mL/hr at 04/11/23 1048    PRN Meds: [MAR Hold] sodium chloride, sodium chloride, [MAR Hold] acetaminophen, Heparin (Porcine) in NaCl, [MAR Hold] midodrine, [MAR Hold] ondansetron (ZOFRAN) IV, [MAR Hold] pantoprazole, [MAR Hold] potassium chloride, [MAR Hold] sodium chloride flush, sodium chloride flush   IMPRESSION & RECOMMENDATIONS: Alicia Cook is a 57 y.o. 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   Impression:  Acute on chronic HFrEF, Stage C, NYHA class III Nonischemic cardiomyopathy BiV ICD in situ History of left bundle branch block Orthostatic hypotension Obesity, status post gastric sleeve February 2022 Cutaneous/pulmonary sarcoid  Recommendations: Patient is a young and pleasant female with history of nonischemic cardiomyopathy/chronic HFrEF/had BiV ICD in situ.  She been managed medically for some time however medical management has been challenging since her gastric sleeve in February 2022 due to hypotension and unable to uptitrate GDMT.  She has history of hypertension but since her gastric sleeve has had episodes of orthostasis which is multifactorial.  As a result she uses midodrine on a as needed basis.  She presented to the office yesterday with concerns for acute decompensated heart failure given her exertional symptoms, feeling tired and fatigued.  She was recommended hospitalization for further evaluation.  Advanced heart failure has been asked to evaluate the patient for candidacy/timing of advanced therapy.  Given her clinical trajectory and symptoms she is scheduled for right and left heart catheterization later today.  Echocardiogram is pending.  Will check chest x-ray and EKG.  Currently on Farxiga, digoxin, urine, Lasix IV push, as needed midodrine.  Patient is quite concerned and has questions regarding her long-term prognosis.  I educated her that this question will be better answered after her left and right heart catheterization and the most recent  echocardiography.  Telemetry notes paced rhythm with occasional PVCs.  Will continue current medical therapy for now.  Greatly appreciate assistance from advanced heart failure  team in managing this young/complex patient.  Patient's questions and concerns were addressed to her satisfaction. She voices understanding of the instructions provided during this encounter.   This note was created using a voice recognition software as a result there may be grammatical errors inadvertently enclosed that do not reflect the nature of this encounter. Every attempt is made to correct such errors.  Total time spent:  36 minutes.   Delilah Shan [facility]  Pager:  [phone] Office: [phone] 04/11/2023, 10:54 AM

## 2023-04-11 NOTE — TOC Benefit Eligibility Note (Signed)
Pharmacy Patient Advocate Encounter  Insurance verification completed.    The patient is insured through [facility]   Ran test claim for Corlanor 7.5 mg and the current 30 day co-pay is $4.00.   This test claim was processed through [facility]- copay amounts may vary at other pharmacies due to pharmacy/plan contracts, or as the patient moves through the different stages of their insurance plan.    Roland Earl, CPHT Pharmacy Patient Advocate Specialist [facility] Patient Advocate Team Direct Number: [phone]  Fax: [phone]

## 2023-04-12 ENCOUNTER — Telehealth: Payer: Self-pay

## 2023-04-12 ENCOUNTER — Other Ambulatory Visit (HOSPITAL_COMMUNITY): Payer: Self-pay

## 2023-04-12 ENCOUNTER — Inpatient Hospital Stay (HOSPITAL_COMMUNITY): Payer: Medicaid Other

## 2023-04-12 ENCOUNTER — Encounter: Payer: Self-pay | Admitting: Cardiology

## 2023-04-12 ENCOUNTER — Encounter (HOSPITAL_COMMUNITY): Payer: Self-pay | Admitting: Internal Medicine

## 2023-04-12 DIAGNOSIS — I5043 Acute on chronic combined systolic (congestive) and diastolic (congestive) heart failure: Secondary | ICD-10-CM

## 2023-04-12 DIAGNOSIS — I5023 Acute on chronic systolic (congestive) heart failure: Secondary | ICD-10-CM | POA: Diagnosis not present

## 2023-04-12 DIAGNOSIS — Z95811 Presence of heart assist device: Secondary | ICD-10-CM

## 2023-04-12 LAB — ECHOCARDIOGRAM COMPLETE
AR max vel: 1.98 cm2
AV Area VTI: 1.85 cm2
AV Area mean vel: 1.83 cm2
AV Mean grad: 3 mmHg
AV Peak grad: 4.8 mmHg
Ao pk vel: 1.1 m/s
Area-P 1/2: 4.29 cm2
Calc EF: 24.9 %
Est EF: 10
Height: 67 in
MV M vel: 3.9 m/s
MV Peak grad: 60.8 mmHg
Radius: 0.4 cm
S' Lateral: 6.1 cm
Single Plane A2C EF: 30.8 %
Single Plane A4C EF: 18.6 %
Weight: 3265.6 oz

## 2023-04-12 LAB — BASIC METABOLIC PANEL
Anion gap: 5 (ref 5–15)
BUN: 16 mg/dL (ref 6–20)
CO2: 31 mmol/L (ref 22–32)
Calcium: 9.4 mg/dL (ref 8.9–10.3)
Chloride: 105 mmol/L (ref 98–111)
Creatinine, Ser: 0.86 mg/dL (ref 0.44–1.00)
GFR, Estimated: >60 mL/min (ref >60)
Glucose, Bld: 90 mg/dL (ref 70–99)
Potassium: 4.5 mmol/L (ref 3.5–5.1)
Sodium: 141 mmol/L (ref 135–145)

## 2023-04-12 LAB — DIGOXIN LEVEL: Digoxin Level: 1 ng/mL (ref 0.8–2.0)

## 2023-04-12 LAB — BRAIN NATRIURETIC PEPTIDE: B Natriuretic Peptide: 506.2 pg/mL — ABNORMAL HIGH (ref 0.0–100.0)

## 2023-04-12 MED ORDER — TORSEMIDE 20 MG PO TABS
20.0000 mg | ORAL_TABLET | Freq: Every day | ORAL | 5 refills | Status: DC
  Filled 2023-04-12: qty 30, 30d supply, fill #0

## 2023-04-12 MED ORDER — DIGOXIN 125 MCG PO TABS: 0.1250 mg | ORAL_TABLET | Freq: Every day | ORAL | Status: DC

## 2023-04-12 MED ORDER — DIGOXIN 250 MCG PO TABS
0.2500 mg | ORAL_TABLET | Freq: Every day | ORAL | 0 refills | Status: DC
  Filled 2023-04-12: qty 90, 90d supply, fill #0

## 2023-04-12 MED ORDER — POTASSIUM CHLORIDE ER 20 MEQ PO TBCR: 20.0000 meq | EXTENDED_RELEASE_TABLET | Freq: Every day | ORAL | Status: DC

## 2023-04-12 MED ORDER — DIGOXIN 125 MCG PO TABS
0.1250 mg | ORAL_TABLET | Freq: Every day | ORAL | 5 refills | Status: DC
  Filled 2023-04-12: qty 30, 30d supply, fill #0

## 2023-04-12 MED ORDER — METOPROLOL SUCCINATE ER 25 MG PO TB24
25.0000 mg | ORAL_TABLET | Freq: Every day | ORAL | 2 refills | Status: DC
Start: 2023-04-12 — End: 2023-05-14

## 2023-04-12 MED ORDER — TORSEMIDE 20 MG PO TABS: 20.0000 mg | ORAL_TABLET | Freq: Every day | ORAL | Status: DC

## 2023-04-12 NOTE — Telephone Encounter (Signed)
When can she go back to work? Needs you to write a note saying when she can go back to work.

## 2023-04-12 NOTE — Discharge Summary (Signed)
Physician Discharge Summary  Patient ID: Alicia Cook MRN: [id number] DOB/AGE: [birth year] 57 y.o.  Admit date: 04/10/2023 Discharge date: 04/12/2023  Primary Discharge Diagnosis:  Acute on Chronic Biventricular Failure, Stage C, NYHA Class IIIb/IV Nonischemic cardiomyopathy. BiV ICD in situ  Secondary Discharge Diagnosis: Pulmonary Sarcoid  Type II DM  Orthostatic hypotension Obesity, status post gastric sleeve February 2022  Hospital Course:   57 y.o. 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   Was hospitalized for concern for acute decompensated heart failure with symptoms suggestive of NYHA class IIIb/IV.  She underwent echocardiography and left and right heart catheterization during her hospitalization results noted below for further reference.  Patient was started on parenteral diuretics to help facilitate volume management but overall limited by soft blood pressures.  Right heart catheterization illustrates compensated nonischemic cardiomyopathy.  She was evaluated by Dr. Gala Romney from advanced heart failure to establish care as she may be a candidate for advanced therapies given her young age, excellent compliance, on appropriate & maximally tolerated GDMT.   Discharge Exam: Temp:  [97.5 F (36.4 C)-98.2 F (36.8 C)] 97.5 F (36.4 C) (06/13 0719) Pulse Rate:  [69-74] 71 (06/13 0719) Cardiac Rhythm: Ventricular paced (06/13 0827) Resp:  [18] 18 (06/13 0719) BP: (92-103)/(60-71) 100/71 (06/13 0719) SpO2:  [99 %-100 %]  100 % (06/13 0719) Weight:  [92.6 kg] 92.6 kg (06/13 0428)  Today's Vitals   04/12/23 0048 04/12/23 0428 04/12/23 0719 04/12/23 1041  BP: 92/60 92/62 100/71   Pulse: 72 72 71   Resp: 18 18 18    Temp: 98.2 F (36.8 C) 97.9 F (36.6 C) (!) 97.5 F (36.4 C)   TempSrc: Oral Oral Oral   SpO2: 99% 100% 100%   Weight:  92.6 kg    Height:      PainSc: 0-No pain 0-No pain  0-No pain   Body mass index is 31.97 kg/m.  Physical Exam  Constitutional: No distress.  Age appropriate, hemodynamically stable.   Neck: No JVD present.  Cardiovascular: Normal rate, regular rhythm, S1 normal, S2 normal, intact distal pulses and normal pulses. Exam reveals no gallop, no S3 and no S4.  No murmur heard. Pulses:      Dorsalis pedis pulses are 2+ on the right side and 2+ on the left side.       Posterior tibial pulses are 2+ on the right side and 2+ on the left side.  Bilateral lower extremity swelling is not present, legs are warm to touch.  Pulmonary/Chest: Effort normal and breath sounds normal. No stridor. She has no wheezes. She has no rales. She exhibits no tenderness.  Abdominal: Soft. Bowel sounds are normal. She exhibits no distension. There is no abdominal tenderness.  Abdominal obesity  Musculoskeletal:        General: No edema.     Cervical back: Neck supple.  Neurological: She is alert and oriented to person, place, and time. She has intact cranial nerves (2-12).  Skin: Skin is warm and moist.   Recommendations on discharge:   1.  AHF meds at discharge:  Farxiga 10 mg daily Digoxin 0.125 mg Ivabradine  7.5 mg BID Metoprolol XL 25 mg QHS Spironolactone 25 mg daily Torsemide 20 mg daily + 20 mEq KDUR daily. Can take extra as discussed.  2.  Outpatient follow-up with Dr. Jacinto Halim and Dr. Gala Romney.  Will need outpatient cardiopulmonary stress test for risk stratification for advanced therapies.   CARDIAC DATABASE: EKG: Mar 13, 2023: Atrial sensed ventricular paced rhythm    Echocardiogram: 04/12/2023  1. Left ventricular ejection fraction, by estimation, is 10%. The left  ventricle has normal function. The left ventricle demonstrates global  hypokinesis. The left ventricular internal cavity size was severely  dilated. Left ventricular diastolic  parameters are consistent with Grade II diastolic dysfunction  (pseudonormalization).   2. Right ventricular systolic function is mildly reduced. The right  ventricular size is normal.   3. Left atrial size was mildly dilated.   4. The mitral valve is normal in structure. Mild mitral valve  regurgitation. No evidence of mitral stenosis.   5. The aortic valve is tricuspid. There is mild calcification of the  aortic valve. Aortic valve regurgitation is not visualized. No aortic  stenosis is present.   6. The inferior vena cava is normal in size with greater than 50%  respiratory variability, suggesting right atrial pressure of 3 mmHg.    Stress test: None   Heart catheterization: Right heart catheterization and myocardial biopsy 07/17/2016: Mild pulmonary hypertension, mildly elevated pulmonary capillary wedge pressure, biopsy negative for sarcoid.   Right and left heart catheterization coronary angiography 07/12/2021: Right heart pressures: LV EDP is normal. RA: 0 mmHg RV: 19/0 mmHg PA: 21/3 mmHg, mPAP 10 mmHg PCW: 4 mmHg CO: 5.7 L/min CI: 2.7 L/min/m2   Conclusion:  Left dominant circulation No coronary artery disease Well compensated nonischemic cardiomyopathy  RHC and LHC 04/11/2023 w/ Dr. Gala Romney    The left ventricular ejection fraction is less than 25% by visual estimate.   Findings:   Ao = 102/65 (78) LV = 92/23 RA =  6 RV = 32/10 PA = 32/14 (22) PCW = 11 Fick cardiac output/index = 4.8/2.4 PVR = 2.2 WU Ao sat = 97% PA sat = 66%, 69% PAPi = 3.0   Assessment: 1. Normal coronaries 2. Severe NICM EF < 20% 3. Well-compensated hemodynamics with moderately reduced CO    Plan/Discussion: Will plan CPX testing as outpatient to further risk stratify for advanced therapies.    PET cardiac sarcoidosis 09/30/2021: 1.  Moderate perfusion defect in the lateral wall of the heart.  2.  No definite evidence of myocardial uptake to suggest active myocardial sarcoidosis.  3.  Left ventricular ejection fraction is 18%. Please correlate with cardiac ultrasound  4.  Multilevel FDG avid mediastinal and bilateral hilar lymphadenopathy most compatible with sequela of sarcoidosis.    Remote BiV ICD transmission 03/13/2023: Longevity 2 years and 6 months.  AP 0%, CRT 97 %.  Lead impedance and thresholds are stable.  No mode switch. Normal BiV ICD function.  Labs:   Lab Results  Component Value Date   WBC 8.0 04/10/2023   HGB 12.2 04/11/2023   HGB 13.9 04/11/2023   HCT 36.0 04/11/2023   HCT 41.0 04/11/2023   MCV 90.5 04/10/2023   PLT 177 04/10/2023    Recent Labs  Lab 04/10/23 1815 04/11/23 0015 04/12/23 0019  NA 137   < > 141  K 4.3   < > 4.5  CL 102   < > 105  CO2 26   < > 31  BUN 14   < >  16  CREATININE 0.91   < > 0.86  CALCIUM 9.5   < > 9.4  PROT 7.2  --   --   BILITOT 0.6  --   --   ALKPHOS 115  --   --   ALT 19  --   --   AST 25  --   --   GLUCOSE 134*   < > 90   < > = values in this interval not displayed.    Lipid Panel     Component Value Date/Time   CHOL 165 02/09/2016 0700   TRIG 94 02/09/2016 0700   HDL 64 02/09/2016 0700   CHOLHDL 2.6 02/09/2016 0700   VLDL 19 02/09/2016 0700   LDLCALC 82 02/09/2016 0700    BNP (last 3 results) Recent Labs    04/10/23 1815 04/11/23 0015 04/12/23 0019  BNP 633.1* 571.3* 506.2*    HEMOGLOBIN A1C Lab Results  Component Value Date   HGBA1C 6.0 (H) 07/17/2021   MPG 125.5 07/17/2021    Cardiac Panel (last 3 results) No results for input(s): "CKTOTAL", "CKMB", "TROPONINI", "RELINDX" in the last 8760 hours.  Lab Results  Component Value Date   TROPONINI <0.03 07/18/2017     TSH Recent  Labs    04/10/23 1815  TSH 1.110    Radiology: ECHOCARDIOGRAM COMPLETE  Result Date: 04/12/2023    ECHOCARDIOGRAM REPORT   Patient Name:   CHERELLE RENNER Date of Exam: 04/12/2023 Medical Rec #:  [id number]       Height:       67.0 in Accession #:    [id number]      Weight:       204.1 lb Date of Birth:  December 18, 1965      BSA:          2.040 m Patient Age:    56 years        BP:           100/71 mmHg Patient Gender: F               HR:           70 bpm. Exam Location:  Inpatient Procedure: 2D Echo, Color Doppler and Cardiac Doppler Indications:    CHF  History:        Patient has prior history of Echocardiogram examinations, most                 recent 03/19/2023. CHF, Defibrillator, Arrythmias:LBBB; Risk                 Factors:Sleep Apnea, Dyslipidemia and Diabetes. Sarcoidosis of                 the lung, Cardiac Cachexia, Hypotension.  Sonographer:    Milbert Coulter Referring Phys: [id number] DANIEL R BENSIMHON IMPRESSIONS  1. Left ventricular ejection fraction, by estimation, is 10%. The left ventricle has normal function. The left ventricle demonstrates global hypokinesis. The left ventricular internal cavity size was severely dilated. Left ventricular diastolic parameters are consistent with Grade II diastolic dysfunction (pseudonormalization).  2. Right ventricular systolic function is mildly reduced. The right ventricular size is normal.  3. Left atrial size was mildly dilated.  4. The mitral valve is normal in structure. Mild mitral valve regurgitation. No evidence of mitral stenosis.  5. The aortic valve is tricuspid. There is mild calcification of the aortic valve. Aortic valve regurgitation is not visualized. No aortic stenosis is present.  6. The inferior vena cava is  normal in size with greater than 50% respiratory variability, suggesting right atrial pressure of 3 mmHg. FINDINGS  Left Ventricle: Left ventricular ejection fraction, by estimation, is 10%. The left ventricle has normal function. The left  ventricle demonstrates global hypokinesis. The left ventricular internal cavity size was severely dilated. There is no left ventricular hypertrophy. Left ventricular diastolic parameters are consistent with Grade II diastolic dysfunction (pseudonormalization). Right Ventricle: The right ventricular size is normal. No increase in right ventricular wall thickness. Right ventricular systolic function is mildly reduced. Left Atrium: Left atrial size was mildly dilated. Right Atrium: Right atrial size was normal in size. Pericardium: There is no evidence of pericardial effusion. Mitral Valve: The mitral valve is normal in structure. Mild mitral valve regurgitation. No evidence of mitral valve stenosis. Tricuspid Valve: The tricuspid valve is normal in structure. Tricuspid valve regurgitation is trivial. No evidence of tricuspid stenosis. Aortic Valve: The aortic valve is tricuspid. There is mild calcification of the aortic valve. Aortic valve regurgitation is not visualized. No aortic stenosis is present. Aortic valve mean gradient measures 3.0 mmHg. Aortic valve peak gradient measures 4.8 mmHg. Aortic valve area, by VTI measures 1.85 cm. Pulmonic Valve: The pulmonic valve was normal in structure. Pulmonic valve regurgitation is not visualized. No evidence of pulmonic stenosis. Aorta: The aortic root is normal in size and structure. Venous: The inferior vena cava is normal in size with greater than 50% respiratory variability, suggesting right atrial pressure of 3 mmHg. IAS/Shunts: No atrial level shunt detected by color flow Doppler. Additional Comments: A device lead is visualized.  LEFT VENTRICLE PLAX 2D LVIDd:         6.60 cm      Diastology LVIDs:         6.10 cm      LV e' medial:    7.51 cm/s LV PW:         1.20 cm      LV E/e' medial:  9.6 LV IVS:        0.90 cm      LV e' lateral:   3.26 cm/s LVOT diam:     1.90 cm      LV E/e' lateral: 22.0 LV SV:         30 LV SV Index:   15 LVOT Area:     2.84 cm  LV  Volumes (MOD) LV vol d, MOD A2C: 214.0 ml LV vol d, MOD A4C: 199.0 ml LV vol s, MOD A2C: 148.0 ml LV vol s, MOD A4C: 162.0 ml LV SV MOD A2C:     66.0 ml LV SV MOD A4C:     199.0 ml LV SV MOD BP:      52.7 ml RIGHT VENTRICLE             IVC RV S prime:     13.90 cm/s  IVC diam: 1.30 cm TAPSE (M-mode): 1.7 cm LEFT ATRIUM             Index LA diam:        2.70 cm 1.32 cm/m LA Vol (A2C):   86.9 ml 42.60 ml/m LA Vol (A4C):   50.8 ml 24.90 ml/m LA Biplane Vol: 66.8 ml 32.75 ml/m  AORTIC VALVE AV Area (Vmax):    1.98 cm AV Area (Vmean):   1.83 cm AV Area (VTI):     1.85 cm AV Vmax:           110.00 cm/s AV Vmean:  73.300 cm/s AV VTI:            0.164 m AV Peak Grad:      4.8 mmHg AV Mean Grad:      3.0 mmHg LVOT Vmax:         77.00 cm/s LVOT Vmean:        47.200 cm/s LVOT VTI:          0.107 m LVOT/AV VTI ratio: 0.65  AORTA Ao Root diam: 3.00 cm Ao Asc diam:  2.40 cm MITRAL VALVE MV Area (PHT): 4.29 cm       SHUNTS MV Decel Time: 177 msec       Systemic VTI:  0.11 m MR Peak grad:    60.8 mmHg    Systemic Diam: 1.90 cm MR Mean grad:    35.0 mmHg MR Vmax:         390.00 cm/s MR Vmean:        278.0 cm/s MR PISA:         1.01 cm MR PISA Eff ROA: 8 mm MR PISA Radius:  0.40 cm MV E velocity: 71.80 cm/s MV A velocity: 78.10 cm/s MV E/A ratio:  0.92 Arvilla Meres MD Electronically signed by Arvilla Meres MD Signature Date/Time: 04/12/2023/11:54:44 AM    Final    DG CHEST PORT 1 VIEW  Result Date: 04/11/2023 CLINICAL DATA:  [id number] CHF (congestive heart failure) (HCC) 69629 EXAM: PORTABLE CHEST - 1 VIEW COMPARISON:  02/05/2023 FINDINGS: Stable left subclavian AICD. Lungs clear. Chronic blunting of left lateral costophrenic angle. Heart size upper limits normal. No pneumothorax. Vertebral endplate spurring at multiple levels in the mid thoracic spine. IMPRESSION: 1. No acute findings. 2. Chronic blunting of left lateral costophrenic angle. Electronically Signed   By: Corlis Leak M.D.   On: 04/11/2023 15:30    CARDIAC CATHETERIZATION  Result Date: 04/11/2023   The left ventricular ejection fraction is less than 25% by visual estimate. Findings: Ao = 102/65 (78) LV = 92/23 RA =  6 RV = 32/10 PA = 32/14 (22) PCW = 11 Fick cardiac output/index = 4.8/2.4 PVR = 2.2 WU Ao sat = 97% PA sat = 66%, 69% PAPi = 3.0 Assessment: 1. Normal coronaries 2. Severe NICM EF < 20% 3. Well-compensated hemodynamics with moderately reduced CO Plan/Discussion: Will plan CPX testing as outpatient to further risk stratify for advanced therapies. Arvilla Meres, MD 12:34 PM  PCV ECHOCARDIOGRAM COMPLETE  Addendum Date: 03/20/2023   Echocardiogram 03/19/2023: Left ventricle cavity is severely dilated. Normal left ventricular wall thickness. Hypokinetic global wall motion. Abnormal septal wall motion due to left bundle branch block. Indeterminate diastolic filling pattern due to mitral regurgitation and paced rhythm. Severely depressed LV systolic function with visual EF <20%-25%. Calculated EF 31%. Left atrial cavity is severely dilated at 49.9 ml/m^2. Structurally normal mitral valve. Annulus is dilated. There is mild restriction in the posterior MV leaflet motion with resultant posteriorly directed moderate to severe mitral regurgitation. Dense/triangular CWD jet. Structurally normal tricuspid valve. Mild tricuspid regurgitation. Mild pulmonary hypertension. RVSP measures 39 mmHg. Pericardium is normal in appearance. Small circumferential pericardial effusion (0.5 to 1.2 cm) with clear fluids. No evidence of pleural effusion. There is no hemodynamic significance. No significant change from 08/30/2022, pericardial effusion is new.   Result Date: 03/20/2023 Echocardiogram 03/19/2023: Left ventricle cavity is severely dilated. Normal left ventricular wall thickness. Hypokinetic global wall motion. Abnormal septal wall motion due to left bundle branch block. Indeterminate diastolic filling pattern due to mitral  regurgitation and paced  rhythm. Severely depressed LV systolic function with visual EF <20%-25%. Calculated EF 31%. Left atrial cavity is severely dilated at 49.9 ml/m^2. Structurally normal mitral valve. Annulus is dilated. There is mild restriction in the posterior MV leaflet motion with resultant posteriorly directed moderate to severe mitral regurgitation. Dense/triangular CWD jet. Structurally normal tricuspid valve. Mild tricuspid regurgitation. Mild pulmonary hypertension. RVSP measures 39 mmHg. Pericardium is normal in appearance. Small pericardial effusion (0.5 to 11.2 cm) with clear fluids. No evidence of pleural effusion. There is no hemodynamic significance. No significant change from 08/30/2022, pericardial effusion is new.      FOLLOW UP PLANS AND APPOINTMENTS  Allergies as of 04/12/2023   No Known Allergies      Medication List     TAKE these medications    digoxin 0.125 MG tablet Commonly known as: LANOXIN Take 1 tablet (0.125 mg total) by mouth daily. Start taking on: April 13, 2023 What changed:  medication strength how much to take additional instructions   Farxiga 10 MG Tabs tablet Generic drug: dapagliflozin propanediol Take 1 tablet (10 mg total) by mouth daily before breakfast.   folic acid 1 MG tablet Commonly known as: FOLVITE Take 3 mg by mouth daily with lunch.   IRON PO Take 1 tablet by mouth daily.   ivabradine 7.5 MG Tabs tablet Commonly known as: CORLANOR Take 1 tablet (7.5 mg total) by mouth 2 (two) times daily with a meal.   magnesium hydroxide 400 MG/5ML suspension Commonly known as: MILK OF MAGNESIA Take 30 mLs by mouth daily as needed for mild constipation.   magnesium oxide 400 (240 Mg) MG tablet Commonly known as: MAGnesium-Oxide Take 1 tablet (400 mg total) by mouth daily.   Methotrexate 25 MG/ML Sosy Inject 1 mL into the skin once a week.   metoprolol succinate 25 MG 24 hr tablet Commonly known as: TOPROL-XL Take 1 tablet (25 mg total) by mouth at  bedtime. Hold if systolic blood pressure (top number) less than 90 mmHg or pulse less than 60 bpm. What changed: additional instructions   midodrine 10 MG tablet Commonly known as: PROAMATINE Take 1 tablet (10 mg total) by mouth 3 (three) times daily with meals as needed. What changed: reasons to take this   multivitamin with minerals Tabs tablet Take 1 tablet by mouth daily with lunch.   pantoprazole 40 MG tablet Commonly known as: PROTONIX Take 40 mg by mouth daily as needed (acid reflux).   Potassium Chloride ER 20 MEQ Tbcr Take 1 tablet (20 mEq total) by mouth daily.   sertraline 100 MG tablet Commonly known as: ZOLOFT Take 100 mg by mouth daily.   spironolactone 25 MG tablet Commonly known as: ALDACTONE Take 1 tablet (25 mg total) by mouth daily.   tirzepatide 10 MG/0.5ML Pen Commonly known as: MOUNJARO Inject 10 mg into the skin once a week.   torsemide 20 MG tablet Commonly known as: DEMADEX Take 1 tablet (20 mg total) by mouth daily. Start taking on: April 13, 2023 What changed:  when to take this reasons to take this        Follow-up Information     [facility] Follow up on 04/17/2023.   Specialty: Cardiology Why: Follow up in the [facility] Clinic 6/18 at 10:20 am Entrance C, Free valet Please bring all medications with you to the appt. Contact information: [address] [id number] mc [place] [address] [phone]  Yates Decamp, MD. Go on 04/25/2023.   Specialty: Cardiology Why: 3:45pm Contact information: [address] [place] [address] [phone]                Total time spent on patient's discharge was 35 minutes.   Tessa Lerner, [place], [facility]  Pager:  [phone] Office: [phone]

## 2023-04-12 NOTE — TOC Initial Note (Addendum)
Transition of Care [facility]) - Initial/Assessment Note    Patient Details  Name: Alicia Cook MRN: [id number] Date of Birth: 08/08/1966  Transition of Care [facility]) CM/SW Contact:    Leone Haven, RN Phone Number: 04/12/2023, 1:42 PM  Clinical Narrative:                 From home alone, she has a PCP, Dr. Joselyn Glassman, she has insurance on file copays are 4.00.  she does not have any DME at home that she currently uses, she has no HH services in place at this time.  She states her friedn Colin Mulders will transport her home if she needs to, but she has her car outside.  Colin Mulders is also her support system, she [facility] [place], but she is ok with TOC filling her medications for her.  She still works 40 hrs/week.  She would like to speak to the CSW about her utilities.  NCM informed CSW of this information.  NCM gave patient the financial resources and housing and rent resource, to help with utilities information.  Informed her to contact [employer] per CSW.  Expected Discharge Plan: Home/Self Care Barriers to Discharge: No Barriers Identified   Patient Goals and CMS Choice Patient states their goals for this hospitalization and ongoing recovery are:: return home   Choice offered to / list presented to : NA      Expected Discharge Plan and Services In-house Referral: Clinical Social Work Discharge Planning Services: CM Consult Post Acute Care Choice: NA Living arrangements for the past 2 months: Single Family Home Expected Discharge Date: 04/12/23               DME Arranged: N/A DME Agency: NA       HH Arranged: NA          Prior Living Arrangements/Services Living arrangements for the past 2 months: Single Family Home Lives with:: Self Patient language and need for interpreter reviewed:: Yes Do you feel safe going back to the place where you live?: Yes      Need for Family Participation in Patient Care: Yes (Comment) Care giver support system in  place?: Yes (comment)   Criminal Activity/Legal Involvement Pertinent to Current Situation/Hospitalization: No - Comment as needed  Activities of Daily Living Home Assistive Devices/Equipment: None ADL Screening (condition at time of admission) Patient's cognitive ability adequate to safely complete daily activities?: Yes Is the patient deaf or have difficulty hearing?: No Does the patient have difficulty seeing, even when wearing glasses/contacts?: No Does the patient have difficulty concentrating, remembering, or making decisions?: No Patient able to express need for assistance with ADLs?: Yes Does the patient have difficulty dressing or bathing?: No Independently performs ADLs?: Yes (appropriate for developmental age) Does the patient have difficulty walking or climbing stairs?: No Weakness of Legs: None Weakness of Arms/Hands: None  Permission Sought/Granted                  Emotional Assessment Appearance:: Appears stated age Attitude/Demeanor/Rapport: Engaged Affect (typically observed): Appropriate Orientation: : Oriented to Self, Oriented to Place, Oriented to  Time, Oriented to Situation Alcohol / Substance Use: Not Applicable Psych Involvement: No (comment)  Admission diagnosis:  Acute on chronic systolic heart failure (HCC) [I50.23] Patient Active Problem List   Diagnosis Date Noted   History of left bundle branch block 04/11/2023   Acute on chronic systolic heart failure (HCC) 04/10/2023   Failure to thrive (child) 04/10/2023   Cardiac cachexia  04/10/2023   Salmonella gastroenteritis 07/19/2021   SIRS (systemic inflammatory response syndrome) (HCC) 07/18/2021   Depression 07/18/2021   Hypokalemia 07/18/2021   Headache 07/18/2021   Hypotension    Pericardial effusion    Fever 07/16/2021   Encounter for adjustment of biventricular implantable cardioverter-defibrillator (ICD) 06/13/2019   Palpitations 12/05/2018   OSA (obstructive sleep apnea) 11/04/2018    Lung disease 01/17/2017   RLS (restless legs syndrome) 01/17/2017   Gastroesophageal reflux disease without esophagitis 11/30/2016   Chest pain 02/08/2016   Sarcoidosis 02/08/2016   Benign essential HTN 02/08/2016   Nonischemic cardiomyopathy (HCC) 02/08/2016   Non-insulin dependent type 2 diabetes mellitus (HCC) 02/08/2016   Chronic HFrEF (heart failure with reduced ejection fraction) (HCC) 02/08/2016   Acquired hypothyroidism 09/02/2015   Heart failure with reduced left ventricular function (HCC) 09/02/2015   Hyperlipidemia 09/02/2015   PSVT (paroxysmal supraventricular tachycardia) 09/02/2015   Biventricular ICD (implantable cardioverter-defibrillator) in place 01/13/2015   PCP:  Kennith Maes, PA-C Pharmacy:   [id number] [place], [place] - [address] [place] [address] Phone: [phone] Fax: [phone]  [facility] - [place], [place] - [address] [place] [address] Phone: [phone] Fax: [phone]  [facility] [address] [place] [address] Phone: [phone] Fax: [phone]     Social Determinants of Health (SDOH) Social History: SDOH Screenings   Food Insecurity: No Food Insecurity (04/10/2023)  Housing: Medium Risk (04/10/2023)  Transportation Needs: No Transportation Needs (04/10/2023)  Utilities: At Risk (04/10/2023)  Tobacco Use: Medium Risk (04/12/2023)   SDOH Interventions:     Readmission Risk Interventions     No data to display

## 2023-04-12 NOTE — Progress Notes (Addendum)
Advanced Heart Failure Rounding Note  PCP-Cardiologist: None   Subjective:   AHF team following for advanced therapies evaluation  RHC yesterday (6/12) with severe NICM EF <20%, RA 6, PA 32/14 (22), PCW 11, Fick CO/CI 4.8/2.4, PVR 2.2, PAPi 3  Echo later today.   Feels fine, no complaints.   Objective:   Weight Range: 92.6 kg Body mass index is 31.97 kg/m.   Vital Signs:   Temp:  [97.5 F (36.4 C)-98.2 F (36.8 C)] 97.5 F (36.4 C) (06/13 0719) Pulse Rate:  [69-74] 71 (06/13 0719) Resp:  [18] 18 (06/13 0719) BP: (92-103)/(60-71) 100/71 (06/13 0719) SpO2:  [99 %-100 %] 100 % (06/13 0719) Weight:  [92.6 kg] 92.6 kg (06/13 0428) Last BM Date : 04/09/23  Weight change: Filed Weights   04/10/23 1719 04/11/23 0457 04/12/23 0428  Weight: 92.7 kg 92.4 kg 92.6 kg    Intake/Output:   Intake/Output Summary (Last 24 hours) at 04/12/2023 0812 Last data filed at 04/12/2023 0432 Gross per 24 hour  Intake 0 ml  Output 1300 ml  Net -1300 ml    Physical Exam    General:  well appearing.  No respiratory difficulty HEENT: normal Neck: supple. JVD flat. Carotids 2+ bilat; no bruits. No lymphadenopathy or thyromegaly appreciated. Cor: PMI nondisplaced. Regular rate & rhythm. No rubs, gallops or murmurs. Lungs: clear Abdomen: soft, nontender, nondistended. No hepatosplenomegaly. No bruits or masses. Good bowel sounds. Extremities: no cyanosis, clubbing, rash, edema  Neuro: alert & oriented x 3, cranial nerves grossly intact. moves all 4 extremities w/o difficulty. Affect pleasant.   Telemetry   V paced 60-70s (Personally reviewed)    EKG    No new EKG today  Labs    CBC Recent Labs    04/10/23 1815 04/11/23 1116 04/11/23 1121  WBC 8.0  --   --   HGB 13.8 11.9* 13.9  12.2  HCT 43.6 35.0* 41.0  36.0  MCV 90.5  --   --   PLT 177  --   --    Basic Metabolic Panel Recent Labs    16/10/96 1815 04/11/23 0015 04/11/23 1116 04/11/23 1121 04/12/23 0019  NA  137 139   < > 142  147* 141  K 4.3 3.8   < > 3.8  3.1* 4.5  CL 102 105  --   --  105  CO2 26 27  --   --  31  GLUCOSE 134* 83  --   --  90  BUN 14 15  --   --  16  CREATININE 0.91 0.78  --   --  0.86  CALCIUM 9.5 8.9  --   --  9.4  MG 2.2  --   --   --   --    < > = values in this interval not displayed.   Liver Function Tests Recent Labs    04/10/23 1815  AST 25  ALT 19  ALKPHOS 115  BILITOT 0.6  PROT 7.2  ALBUMIN 3.7   No results for input(s): "LIPASE", "AMYLASE" in the last 72 hours. Cardiac Enzymes No results for input(s): "CKTOTAL", "CKMB", "CKMBINDEX", "TROPONINI" in the last 72 hours.  BNP: BNP (last 3 results) Recent Labs    04/10/23 1815 04/11/23 0015 04/12/23 0019  BNP 633.1* 571.3* 506.2*   ProBNP (last 3 results) Recent Labs    03/16/23 1225  PROBNP 1,753*   D-Dimer No results for input(s): "DDIMER" in the last 72 hours. Hemoglobin A1C No  results for input(s): "HGBA1C" in the last 72 hours. Fasting Lipid Panel No results for input(s): "CHOL", "HDL", "LDLCALC", "TRIG", "CHOLHDL", "LDLDIRECT" in the last 72 hours. Thyroid Function Tests Recent Labs    04/10/23 1815  TSH 1.110   Other results:  Imaging    DG CHEST PORT 1 VIEW  Result Date: 04/11/2023 CLINICAL DATA:  09811 CHF (congestive heart failure) (HCC) 97293 EXAM: PORTABLE CHEST - 1 VIEW COMPARISON:  02/05/2023 FINDINGS: Stable left subclavian AICD. Lungs clear. Chronic blunting of left lateral costophrenic angle. Heart size upper limits normal. No pneumothorax. Vertebral endplate spurring at multiple levels in the mid thoracic spine. IMPRESSION: 1. No acute findings. 2. Chronic blunting of left lateral costophrenic angle. Electronically Signed   By: Corlis Leak M.D.   On: 04/11/2023 15:30   CARDIAC CATHETERIZATION  Result Date: 04/11/2023   The left ventricular ejection fraction is less than 25% by visual estimate. Findings: Ao = 102/65 (78) LV = 92/23 RA =  6 RV = 32/10 PA = 32/14 (22)  PCW = 11 Fick cardiac output/index = 4.8/2.4 PVR = 2.2 WU Ao sat = 97% PA sat = 66%, 69% PAPi = 3.0 Assessment: 1. Normal coronaries 2. Severe NICM EF < 20% 3. Well-compensated hemodynamics with moderately reduced CO Plan/Discussion: Will plan CPX testing as outpatient to further risk stratify for advanced therapies. Arvilla Meres, MD 12:34 PM   Medications:   Scheduled Medications:  dapagliflozin propanediol  10 mg Oral QAC breakfast   digoxin  0.25 mg Oral Daily   enoxaparin (LOVENOX) injection  40 mg Subcutaneous Q24H   ferrous sulfate  325 mg Oral Daily   folic acid  3 mg Oral Q lunch   furosemide  20 mg Intravenous Daily   ivabradine  7.5 mg Oral BID WC   magnesium oxide  400 mg Oral Daily   metoprolol succinate  25 mg Oral QHS   multivitamin with minerals  1 tablet Oral Q lunch   sertraline  200 mg Oral Q lunch   sodium chloride flush  3 mL Intravenous Q12H   sodium chloride flush  3 mL Intravenous Q12H   spironolactone  25 mg Oral Daily    Infusions:  sodium chloride     sodium chloride      PRN Medications: sodium chloride, sodium chloride, acetaminophen, acetaminophen, midodrine, ondansetron (ZOFRAN) IV, ondansetron (ZOFRAN) IV, pantoprazole, sodium chloride flush, sodium chloride flush Patient Profile   57 y/o AAF w/ chronic systolic heart failure due to NICM since 2017. Echo then showed EF 25-30, normal RV. LHC in 2017 and 2022 showed normal coronaries. S/p BiV ICD. Also w/ pulmonary sarcoid. No cardiac evolvement, had negative myocardial biopsy in 2017 and negative cardiac PET in 2022. Also h/o T2DM, HTN, OSA and obesity, s/p gastric sleeve surgery 11/2020.  AHF team consulted for advanced therapies  Assessment/Plan  1. Acute on Chronic, End-Staged, Systolic Heart Failure/ Biventricular Dysfunction  - NICM since 2017. LHCs 2017 and 2022 showed no CAD - Echo 2017 EF 25-30%, normal RV - LBBB, s/p BiV ICD  - Echo 08/2022 EF 20-25%, mod-severe MR, RV mod reduced  - Echo  02/2023 EF 20-25%, mod-severe MR - Cardiac PET (-) for sarcoid - has struggled w/ worsening HF w/ worsening fatigue/ exercise tolerance, inability to tolerate GDMT and continued wt loss/ cardiac cachexia and felt to be end-stage, NYHA class IIIb  - Needs evaluation for advanced therapies followed by referral to [facility] for transplant eval vs LVAD  - RHC yesterday (  6/12) with severe NICM EF <20%, RA 6, PA 32/14 (22), PCW 11, Fick CO/CI 4.8/2.4, PVR 2.2, PAPi 3 - volume appears stable. On 20 IV lasix daily. Transition back to daily Torsemide + KDUR (had been taking daily at home). Can take additional PRN + KDUR.  - Continue Farxiga 10 mg daily  - Continue spironolactone 25 mg daily  - Continue Toprol XL 25 mg daily  - Decrease digoxin 0.25>0.125 mg daily, level slightly elevated today 1. Will recheck at follow up - Continue ivabradine 7.5 mg bid  - BP too soft for ARB/ARNi - plan for outpatient CPX - will update echo as images not available for MD review.    2. Pulmonary Sarcoid  - no cardiac involvement, negative myocardial biopsy in 2017 and negative cardiac PET 2022    3. Type 2DM  - recommend repeating Hgb A1c, last on file 2022 (6.0)  - on Farxiga    4. HTN - controlled on current regimen - GDMT per above   Appears stable for discharge. Will get close f/u in AHF clinic.   AHF meds at discharge:  Farxiga 10 mg daily Digoxin 0.125 mg Ivabradine 7.5 mg BID Metoprolol XL 25 mg QHS Spironolactone 25 mg daily Torsemide 20 mg daily + 20 mEq KDUR daily. Can take extra as discussed.   Length of Stay: 2  Alen Bleacher, NP  04/12/2023, 8:12 AM  Advanced Heart Failure Team Pager [phone] (M-F; 7a - 5p)  Please contact [facility] for night-coverage after hours (5p -7a ) and weekends on [email or web address]  Patient seen and examined with the above-signed Advanced Practice Provider and/or Housestaff. I personally reviewed laboratory data, imaging studies and relevant notes. I independently  examined the patient and formulated the important aspects of the plan. I have edited the note to reflect any of my changes or salient points. I have personally discussed the plan with the patient and/or family.  General:  Well appearing. No resp difficulty HEENT: normal Neck: supple. no JVD. Carotids 2+ bilat; no bruits. No lymphadenopathy or thryomegaly appreciated. Cor:  Regular rate & rhythm. No rubs, gallops or murmurs. Lungs: clear Abdomen: soft, nontender, nondistended. No hepatosplenomegaly. No bruits or masses. Good bowel sounds. Extremities: no cyanosis, clubbing, rash, edema Neuro: alert & orientedx3, cranial nerves grossly intact. moves all 4 extremities w/o difficulty. Affect pleasant  RHC with well compensated filling pressures and moderately reduced CO. Will need CPX testing as outpatient to further evaluate for advanced therapies. We will arrange outpatient f/u. Meds reviewed personally.    Arvilla Meres, MD  9:47 AM

## 2023-04-12 NOTE — Telephone Encounter (Signed)
TOC visit w/ JG in one week :  Location of hospitalization: [facility] Reason for hospitalization: SOB Date of discharge: 04/12/2023 Date of first communication with patient: today Person contacting patient: Erby Pian, CMA Current symptoms: still not feeling well, dizzy Do you understand why you were in the Hospital: Yes Questions regarding discharge instructions: None Where were you discharged to: Home Medications reviewed: Yes Allergies reviewed: Yes Dietary changes reviewed: Yes. Discussed low fat and low salt diet.  Referals reviewed: NA Activities of Daily Living: Able to with mild limitations Any transportation issues/concerns: None Any patient concerns: None Confirmed importance & date/time of Follow up appt: Yes Confirmed with patient if condition begins to worsen call. Pt was given the office number and encouraged to call back with questions or concerns: Yes

## 2023-04-12 NOTE — Progress Notes (Signed)
Transplant evaluation packet sent to [facility] at the request of Dr. Gala Romney. Orders placed for outpatient CPX. Awaiting appointment details d/t equipment repairs.   Simmie Davies RN, BSN VAD Coordinator 24/7 Pager [phone]

## 2023-04-12 NOTE — Progress Notes (Addendum)
Pt is waiting on Doctors note to return to work. IV and tele removed. D/c instructions given

## 2023-04-16 ENCOUNTER — Encounter (INDEPENDENT_AMBULATORY_CARE_PROVIDER_SITE_OTHER): Payer: Self-pay

## 2023-04-17 ENCOUNTER — Encounter (HOSPITAL_COMMUNITY): Payer: Self-pay | Admitting: Internal Medicine

## 2023-04-17 ENCOUNTER — Ambulatory Visit (HOSPITAL_COMMUNITY)
Admit: 2023-04-17 | Discharge: 2023-04-17 | Disposition: A | Discharge status: Home / Self Care | Payer: Medicaid Other | Source: Ambulatory Visit | Attending: Internal Medicine | Admitting: Internal Medicine

## 2023-04-17 VITALS — BP 90/50 | HR 76 | Wt 202.4 lb

## 2023-04-17 DIAGNOSIS — I447 Left bundle-branch block, unspecified: Secondary | ICD-10-CM | POA: Insufficient documentation

## 2023-04-17 DIAGNOSIS — R0602 Shortness of breath: Secondary | ICD-10-CM | POA: Diagnosis present

## 2023-04-17 DIAGNOSIS — Z6831 Body mass index (BMI) 31.0-31.9, adult: Secondary | ICD-10-CM | POA: Diagnosis not present

## 2023-04-17 DIAGNOSIS — I5022 Chronic systolic (congestive) heart failure: Secondary | ICD-10-CM | POA: Diagnosis not present

## 2023-04-17 DIAGNOSIS — Z79899 Other long term (current) drug therapy: Secondary | ICD-10-CM | POA: Diagnosis not present

## 2023-04-17 DIAGNOSIS — R002 Palpitations: Secondary | ICD-10-CM | POA: Diagnosis not present

## 2023-04-17 DIAGNOSIS — Z95810 Presence of automatic (implantable) cardiac defibrillator: Secondary | ICD-10-CM | POA: Diagnosis not present

## 2023-04-17 DIAGNOSIS — Z7984 Long term (current) use of oral hypoglycemic drugs: Secondary | ICD-10-CM | POA: Insufficient documentation

## 2023-04-17 DIAGNOSIS — D860 Sarcoidosis of lung: Secondary | ICD-10-CM | POA: Insufficient documentation

## 2023-04-17 DIAGNOSIS — I110 Hypertensive heart disease with heart failure: Secondary | ICD-10-CM | POA: Insufficient documentation

## 2023-04-17 DIAGNOSIS — E669 Obesity, unspecified: Secondary | ICD-10-CM | POA: Diagnosis not present

## 2023-04-17 DIAGNOSIS — I428 Other cardiomyopathies: Secondary | ICD-10-CM | POA: Diagnosis not present

## 2023-04-17 DIAGNOSIS — E1136 Type 2 diabetes mellitus with diabetic cataract: Secondary | ICD-10-CM | POA: Insufficient documentation

## 2023-04-17 LAB — CBC
HCT: 46.5 % — ABNORMAL HIGH (ref 36.0–46.0)
Hemoglobin: 14.6 g/dL (ref 12.0–15.0)
MCH: 28.7 pg (ref 26.0–34.0)
MCHC: 31.4 g/dL (ref 30.0–36.0)
MCV: 91.4 fL (ref 80.0–100.0)
Platelets: 220 10*3/uL (ref 150–400)
RBC: 5.09 MIL/uL (ref 3.87–5.11)
RDW: 13.2 % (ref 11.5–15.5)
WBC: 7.6 10*3/uL (ref 4.0–10.5)
nRBC: 0 % (ref 0.0–0.2)

## 2023-04-17 LAB — BASIC METABOLIC PANEL
Anion gap: 9 (ref 5–15)
BUN: 14 mg/dL (ref 6–20)
CO2: 29 mmol/L (ref 22–32)
Calcium: 9.3 mg/dL (ref 8.9–10.3)
Chloride: 101 mmol/L (ref 98–111)
Creatinine, Ser: 0.86 mg/dL (ref 0.44–1.00)
GFR, Estimated: >60 mL/min (ref >60)
Glucose, Bld: 95 mg/dL (ref 70–99)
Potassium: 4 mmol/L (ref 3.5–5.1)
Sodium: 139 mmol/L (ref 135–145)

## 2023-04-17 LAB — BRAIN NATRIURETIC PEPTIDE: B Natriuretic Peptide: 333.1 pg/mL — ABNORMAL HIGH (ref 0.0–100.0)

## 2023-04-17 NOTE — Progress Notes (Signed)
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  Pt attempted to apply for FMLA following hospital stay but was told she is slightly over 100 hours short to qualify for this.  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  Pt worried what happens when she has to move forward with transplant.  CSW encouraged pt to inquire with employer about possible STD or LTD benefits to see if she can get any income that way.  CSW informed about patient care fund and that we could provide limited assistance with some bills and that we could assist in accessing other community resources.  Pt feeling very overwhelmed by everything.  States she has good friend support system in this area but that all family is in [place] and some of them she doesn't speak to as they were mad when she moved to [place] years ago.  Pt admits to mental health struggles and states she is seeing a therapist- would like to be on medication but therapist was wary of prescribing medications given pt medical condition.  CSW encouraged pt to have therapist send [place] which medications she was going to prescribe and we can clear them with the cardiologist.   Engaged with patient face to face for initial visit for Heart and Vascular Care Coordination.                                                                                                   HRT/VAS Care Coordination     Living arrangements for the past 2 months Single Family Home   Lives with: Self   Home Assistive Devices/Equipment None   DME Agency NA       Social History:                                                                             SDOH Screenings   Food Insecurity: No Food Insecurity  (04/10/2023)  Housing: Medium Risk (04/10/2023)  Transportation Needs: No Transportation Needs (04/10/2023)  Utilities: At Risk (04/10/2023)  Financial Resource Strain: Medium Risk (04/17/2023)  Tobacco Use: Medium Risk (04/17/2023)    SDOH Interventions: Financial Resources:  Financial Strain Interventions: Intervention Not Indicated Works full time as CMA but unclear how much she can work given health issues.  Food Insecurity:  None reported  Housing Insecurity:  Nothing immediate since she can return to work  Transportation:   None reported   Follow-up plan:    Pt will go back to work for now so she will  be eligible for FMLA services.  Pt will reach out to CSW for assistance if needed.  Burna Sis, LCSW Clinical Social Worker Advanced Heart Failure Clinic Desk#: [phone] Cell#: [phone]

## 2023-04-17 NOTE — Progress Notes (Signed)
Patient brought her Certificate to Return to Work form to appt. Form completed and signed by Dr Gala Romney along with rtw note, and given to pt  Meredith Staggers, RN, BSN, [facility] Specialty Coordinator Advanced Heart Failure Clinic

## 2023-04-17 NOTE — Progress Notes (Signed)
ADVANCED HF CLINIC NOTE  PCP: Kennith Maes, PA-C Primary Cardiologist: Dr. Jacinto Halim HF Cardiologist: DB   HPI:  Alicia Cook is a 57 y/o AAF w/pulmonary sarcoid, DM2, HTN, OSA, obesity, s/p gastric sleeve surgery 11/2020 and chronic systolic heart failure due to NICM.  Developed HF in 2017. Echo at that time EF 25-30%, normal RV. LHC in 2017 and 2022 showed normal coronaries. S/p BiV ICD. Also w/ pulmonary sarcoid. No cardiac evolvement, had negative myocardial biopsy in 2017 and negative cardiac PET in 2022. Also h/o    She has struggled w/ worsening HF w/ worsening fatigue/ exercise tolerance, inability to tolerate GDMT and continued wt loss/ cardiac cachexia and felt to be end-stage.   Admitted for worsening HF in 6/24. Echo 6/24 EF < 10% LV massively dilated, G2 DD, mild MR.  RHC/LHC 04/11/23 No CAD Ao = 102/65 (78) LV = 92/23 RA =  6 RV = 32/10 PA = 32/14 (22) PCW = 11 Fick cardiac output/index = 4.8/2.4 PVR = 2.2 WU Ao sat = 97% PA sat = 66%, 69% PAPi = 3.0  Here for post-hospital f/u. Very fatigued. SOB with mild exertion. Occasional palpitations. No ICD firing. Compliant with meds. No edema.   ICD interrogation: No VT activity 2.1hr/day         ROS: All systems negative except as listed in HPI, PMH and Problem List.  SH:  Social History   Socioeconomic History   Marital status: Single    Spouse name: Not on file   Number of children: 5   Years of education: Not on file   Highest education level: Not on file  Occupational History   Not on file  Tobacco Use   Smoking status: Former    Packs/day: 1.00    Years: 20.00    Additional pack years: 0.00    Total pack years: 20.00    Types: Cigarettes    Quit date: 2016    Years since quitting: 8.4   Smokeless tobacco: Never  Vaping Use   Vaping Use: Never used  Substance and Sexual Activity   Alcohol use: No    Alcohol/week: 0.0 standard drinks of alcohol   Drug use: No   Sexual activity: Not on file   Other Topics Concern   Not on file  Social History Narrative   Not on file   Social Determinants of Health   Financial Resource Strain: Not on file  Food Insecurity: No Food Insecurity (04/10/2023)   Hunger Vital Sign    Worried About Running Out of Food in the Last Year: Never true    Ran Out of Food in the Last Year: Never true  Transportation Needs: No Transportation Needs (04/10/2023)   PRAPARE - Administrator, Civil Service (Medical): No    Lack of Transportation (Non-Medical): No  Physical Activity: Not on file  Stress: Not on file  Social Connections: Not on file  Intimate Partner Violence: Not At Risk (04/10/2023)   Humiliation, Afraid, Rape, and Kick questionnaire    Fear of Current or Ex-Partner: No    Emotionally Abused: No    Physically Abused: No    Sexually Abused: No    FH:  Family History  Problem Relation Age of Onset   Heart attack Father        a. Initial onset in his 39's. S/p CABG   Heart failure Father     Past Medical History:  Diagnosis Date   Cataract  CHF (congestive heart failure) (HCC)    Depression    Diabetes mellitus without complication (HCC)    Encounter for adjustment of biventricular implantable cardioverter-defibrillator (ICD) 06/13/2019   GERD (gastroesophageal reflux disease)    Hypertension    ICD: Cardiac defibrillator in situ 01/13/2015   [employer] Bi-V ICD (Dynagen X4 CRT-D) 01/13/2015 at [facility] - MRI Compatible  Scheduled Remote ICD check 4.22.20: 1 SVT/8 secs, normal function. Battery longevity is 8 years. RA pacing is 1 %, RV pacing is 99 %, and LV pacing is 99 %.   Nonischemic cardiomyopathy (HCC)    a. EF 35-40% by cath in 2011 with normal cors b. s/p ICD implantation in 12/2014 c. EF 25% by NST in 02/01/2016   Sarcoidosis    Sarcoidosis 02/08/2016    Current Outpatient Medications  Medication Sig Dispense Refill   digoxin (LANOXIN) 0.125 MG tablet Take 1 tablet (0.125 mg total) by  mouth daily. 30 tablet 5   FARXIGA 10 MG TABS tablet Take 1 tablet (10 mg total) by mouth daily before breakfast. 90 tablet 1   Ferrous Sulfate (IRON PO) Take 1 tablet by mouth daily.     folic acid (FOLVITE) 1 MG tablet Take 3 mg by mouth daily with lunch.     ivabradine (CORLANOR) 7.5 MG TABS tablet Take 1 tablet (7.5 mg total) by mouth 2 (two) times daily with a meal. 60 tablet 2   magnesium hydroxide (MILK OF MAGNESIA) 400 MG/5ML suspension Take 30 mLs by mouth daily as needed for mild constipation.     MAGnesium-Oxide 400 (240 Mg) MG tablet Take 1 tablet (400 mg total) by mouth daily. 90 tablet 3   Methotrexate 25 MG/ML SOSY Inject 1 mL into the skin once a week.     metoprolol succinate (TOPROL-XL) 25 MG 24 hr tablet Take 1 tablet (25 mg total) by mouth at bedtime. Hold if systolic blood pressure (top number) less than 90 mmHg or pulse less than 60 bpm. 30 tablet 2   midodrine (PROAMATINE) 10 MG tablet Take 1 tablet (10 mg total) by mouth 3 (three) times daily with meals as needed. 90 tablet 0   Multiple Vitamin (MULTIVITAMIN WITH MINERALS) TABS tablet Take 1 tablet by mouth daily with lunch.     pantoprazole (PROTONIX) 40 MG tablet Take 40 mg by mouth daily as needed (acid reflux).     Potassium Chloride ER 20 MEQ TBCR Take 1 tablet (20 mEq total) by mouth daily.     sertraline (ZOLOFT) 100 MG tablet Take 150 mg by mouth daily.     spironolactone (ALDACTONE) 25 MG tablet Take 1 tablet (25 mg total) by mouth daily. 90 tablet 3   tirzepatide (MOUNJARO) 10 MG/0.5ML Pen Inject 10 mg into the skin once a week.     torsemide (DEMADEX) 20 MG tablet Take 1 tablet (20 mg total) by mouth daily. 30 tablet 5   No current facility-administered medications for this encounter.    Vitals:   04/17/23 1031  BP: (!) 90/50  Pulse: 76  SpO2: 100%  Weight: 91.8 kg (202 lb 6.4 oz)    PHYSICAL EXAM:  General:  Fatigued appearing. No resp difficulty HEENT: normal Neck: supple. no JVD. Carotids 2+  bilat; no bruits. No lymphadenopathy or thryomegaly appreciated. Cor: PMI nondisplaced. Regular rate & rhythm. No rubs, gallops or murmurs. Lungs: clear Abdomen: obese soft, nontender, nondistended. No hepatosplenomegaly. No bruits or masses. Good bowel sounds. Extremities: no cyanosis, clubbing, rash, edema Neuro: alert &  orientedx3, cranial nerves grossly intact. moves all 4 extremities w/o difficulty. Affect pleasant     ASSESSMENT & PLAN:  1. Acute on Chronic, End-Staged, Systolic Heart Failure/ Biventricular Dysfunction  - NICM since 2017. LHCs 2017 and 2022 showed no CAD - Echo 2017 EF 25-30%, normal RV - LBBB, s/p BiV ICD  - Echo 08/2022 EF 20-25%, mod-severe MR, RV mod reduced  - Echo 02/2023 EF 20-25%, mod-severe MR - Cardiac PET (-) for sarcoid - RHC  (04/11/23) with severe NICM EF <20%, RA 6, PA 32/14 (22), PCW 11, Fick CO/CI 4.8/2.4, PVR 2.2, PAPi 3 - NYHA IIIB-IV - Volume ok  - Continue torsemide 20 daily - Continue Farxiga 10 mg daily  - Continue spironolactone 25 mg daily  - Continue Toprol XL 25 mg daily  - Decrease digoxin 0.25>0.125 mg daily, level slightly elevated today 1. Will recheck at follow up - Continue ivabradine 7.5 mg bid  - BP too soft for ARB/ARNi - Schedule CPX ASAP - Will refer to [facility] for transplant/VAD evaluation. Family support will be an issue - Check labs with Blood type  - Body mass index is 31.7 kg/m. - ICD interrogation as above   2. Pulmonary Sarcoid  - no cardiac involvement, negative myocardial biopsy in 2017 and negative cardiac PET 2022    3. Type 2DM  - recommend repeating Hgb A1c, last on file 2022 (6.0)  - on Farxiga    4. HTN - controlled on current regimen - GDMT per above   Total time spent 40 minutes. Over half that time spent discussing above.   Arvilla Meres, MD  10:53 AM

## 2023-04-17 NOTE — Patient Instructions (Signed)
Medication Changes:  None, Continue current medications  Lab Work:  Labs done today, your results will be available in MyChart, we will contact you for abnormal readings.  Testing/Procedures:  Your physician has recommended that you have a cardiopulmonary stress test (CPX). CPX testing is a non-invasive measurement of heart and lung function. It replaces a traditional treadmill stress test. This type of test provides a tremendous amount of information that relates not only to your present condition but also for future outcomes. This test combines measurements of you ventilation, respiratory gas exchange in the lungs, electrocardiogram (EKG), blood pressure and physical response before, during, and following an exercise protocol. **WE ARE WORKING TO GET THIS RESCHEDULED FOR NEXT WEEK, WE WILL CALL YOU TO SCHEDULE  Referrals:  You have been referred to Dr Edwena Blow at [facility], his office will call you for an appointment  Special Instructions // Education:  Do the following things EVERYDAY: Weigh yourself in the morning before breakfast. Write it down and keep it in a log. Take your medicines as prescribed Eat low salt foods--Limit salt (sodium) to 2000 mg per day.  Stay as active as you can everyday Limit all fluids for the day to less than 2 liters  You are scheduled for a Cardiopulmonary Exercise (CPX) Test as [facility] on: We will call you with date and time  Expect to be in the lab for 2 hours. Please plan to arrive 30 minutes prior to your appointment. You may be asked to reschedule your test if you arrive 20 minutes or more after your scheduled appointment time.  Main Campus address: [address] [place], [place] [address] You may arrive to the Main Entrance A or Entrance C (free valet parking is available at both). -Main Entrance A (on [address]) :proceed to admitting for check in -Entrance C (on [facility]): proceed to [employer] parking or under hospital deck parking  using this code _________  Check In: [facility] waiting room (1st floor)  General Instructions for the day of the test (Please follow all instructions from your physician): Refrain from ingesting a heavy meal, alcohol, or caffeine or using tobacco products within 2 hours of the test (DO NOT FAST for mare than 8 hours). You may have all other non-alcoholic, non -caffeinated beverage,a light snack (crackers,a piece of fruit, carrot sticks, toast bagel,etc) up to your appointment. Avoid significant exertion or exercise within 24 hours of your test. Be prepared to exercise and sweat. Your clothing should permit freedom of movement and include walking or running shoes. Women bring loose fitting short sleeved blouse.  This evaluation may be fatiguing and you may wish ti have someone accompany you to the assessment to drive you home afterward. Bring a list of your medications with you, including dosage and frequency you take the medications (  I.e.,once per day, twice per day, etc). Take all medications as prescribed, unless noted below or instructed to do so by your physician.  Please do not take the following medications prior to your CPX:  _________________________________________________  _________________________________________________  Brief description of the test: A brief lung test will be performed. This will involve you taking deep breaths and blowing hard and fast through your mouth. During these , a clip will be on your nose and you will be breathing through a breathing device.   For the exercise portion of the test you will be walking on a treadmill, or riding a stationary bike, to your maximal effor or until  symptoms such as chest pain, shortness of breath, leg pain or dizziness limit your exercise. You will be breathing in and out of a breathing device through your mouth (a clip will be on your nose again). Your heart rate, ECG, blood pressure, oxygen saturations, breathing  rate and depth, amount of oxygen you consume and amount of carbon dioxide you produce will be measured and monitored throughout the exercise test.  If you need to cancel or reschedule your appointment please call [phone] If you have further questions please call your physician or Philip Aspen, MS, ACSM-RCEP at [phone]   Follow-Up in: 2 months  At the [facility], you and your health needs are our priority. We have a designated team specialized in the treatment of Heart Failure. This Care Team includes your primary Heart Failure Specialized Cardiologist (physician), Advanced Practice Providers (APPs- Physician Assistants and Nurse Practitioners), and Pharmacist who all work together to provide you with the care you need, when you need it.   You may see any of the following providers on your designated Care Team at your next follow up:  Dr. Arvilla Meres Dr. Marca Ancona Dr. Marcos Eke, NP Robbie Lis, [place] [facility] [place], [place] Brynda Peon, NP Karle Plumber, PharmD   Please be sure to bring in all your medications bottles to every appointment.   Need to Contact us:  If you have any questions or concerns before your next appointment please send [place] a message through [place] or call our office at [phone].    TO LEAVE A MESSAGE FOR THE NURSE SELECT OPTION 2, PLEASE LEAVE A MESSAGE INCLUDING: YOUR NAME DATE OF BIRTH CALL BACK NUMBER REASON FOR CALL**this is important as we prioritize the call backs  YOU WILL RECEIVE A CALL BACK THE SAME DAY AS LONG AS YOU CALL BEFORE 4:00 PM

## 2023-04-17 NOTE — Addendum Note (Signed)
Encounter addended by: Burna Sis, LCSW on: 04/17/2023 3:15 PM  Actions taken: Flowsheet accepted, Clinical Note Signed

## 2023-04-17 NOTE — Addendum Note (Signed)
Encounter addended by: Noralee Space, RN on: 04/17/2023 12:08 PM  Actions taken: Specialty comments modified, Letter saved, Clinical Note Signed, Charge Capture section accepted

## 2023-04-18 ENCOUNTER — Other Ambulatory Visit: Payer: Medicaid Other

## 2023-04-24 ENCOUNTER — Encounter (HOSPITAL_COMMUNITY): Payer: Medicaid Other

## 2023-04-25 ENCOUNTER — Ambulatory Visit: Payer: Medicaid Other | Admitting: Cardiology

## 2023-04-25 ENCOUNTER — Encounter (HOSPITAL_COMMUNITY): Payer: Medicaid Other

## 2023-05-01 ENCOUNTER — Encounter: Payer: Self-pay | Admitting: Internal Medicine

## 2023-05-01 ENCOUNTER — Telehealth (HOSPITAL_COMMUNITY): Payer: Self-pay | Admitting: Cardiology

## 2023-05-01 NOTE — Telephone Encounter (Signed)
Pt called to report she was finally approved for HUMIRA, appeals process took a year. Ordered by rheumatologist   Wanted to confirm with cardiology if ok to start since she is in process with transplant

## 2023-05-02 NOTE — Telephone Encounter (Signed)
Pt aware of HF PharmD recommendations  Reports she spoke with [facility] cards and was told Dr Gala Romney would need to manage medications until transplant is complete.   Will wait for provider input as well

## 2023-05-02 NOTE — Telephone Encounter (Signed)
While I cannot speak for Dr. Gala Cook, I am concerned about her starting Humira given recent decline in EF and ICD shock for VF. Worsening heart failure events have been reported with TNF inhibitors, such as Humira, although true risk is unclear. Per Care Everywhere notes from recent admission 04/23/23 at [facility], she was discussed at transplant committee meeting and was approved to be listed for transplant. During the admission, it was noted there was no concern for acute flare of sarcoid. I would recommend she reach out to the heart transplant coordinators at [facility] for review as they need to know about and approve all medications while listed for transplant.

## 2023-05-02 NOTE — Telephone Encounter (Signed)
Pt aware and voiced understanding 

## 2023-05-07 ENCOUNTER — Telehealth (HOSPITAL_COMMUNITY): Payer: Self-pay | Admitting: Licensed Clinical Social Worker

## 2023-05-07 NOTE — Telephone Encounter (Signed)
H&V Care Navigation CSW Progress Note  Clinical Social Worker received all necessary documents to assist pt with this months rent.  CSW submitted check request for review.  Pt awaiting transplant- was told by [facility] MDs not to work at this time- unsure how she will manage moving forward other than to get assistance from her sons.  Patient is participating in a Managed Medicaid Plan:  Yes  SDOH Screenings   Food Insecurity: No Food Insecurity (04/10/2023)  Housing: Medium Risk (04/10/2023)  Transportation Needs: No Transportation Needs (04/10/2023)  Utilities: At Risk (04/10/2023)  Financial Resource Strain: High Risk (05/07/2023)  Tobacco Use: Medium Risk (04/17/2023)    Burna Sis, LCSW Clinical Social Worker [facility] Desk#: [phone] Cell#: [phone]

## 2023-05-10 ENCOUNTER — Encounter (HOSPITAL_COMMUNITY): Payer: Medicaid Other

## 2023-05-14 ENCOUNTER — Other Ambulatory Visit: Payer: Self-pay | Admitting: Cardiology

## 2023-05-14 DIAGNOSIS — I5022 Chronic systolic (congestive) heart failure: Secondary | ICD-10-CM

## 2023-05-16 ENCOUNTER — Telehealth (HOSPITAL_COMMUNITY): Payer: Self-pay | Admitting: Licensed Clinical Social Worker

## 2023-05-16 NOTE — Telephone Encounter (Signed)
H&V Care Navigation CSW Progress Note  Clinical Social Worker received check from accounts payable to assist with rent for pt.  CSW placed check in mail.  Pt updated on status of payment.  Patient is participating in a Managed Medicaid Plan:  Yes  SDOH Screenings   Food Insecurity: No Food Insecurity (04/23/2023)   Received from [facility], [facility]  Housing: Medium Risk (04/10/2023)  Transportation Needs: No Transportation Needs (04/24/2023)   Received from [facility], [facility]  Utilities: Not At Risk (04/23/2023)   [facility] from [facility], [facility]  Recent Concern: Utilities - At Risk (04/10/2023)  Financial Resource Strain: High Risk (05/07/2023)  Social Connections: Unknown (05/08/2023)   Received from [facility], [facility]  Tobacco Use: Medium Risk (05/14/2023)   Received from [facility]   Burna Sis, LCSW Clinical Social Worker [facility] Desk#: [phone] Cell#: [phone]

## 2023-05-17 ENCOUNTER — Encounter (HOSPITAL_COMMUNITY): Payer: Medicaid Other

## 2023-05-22 ENCOUNTER — Telehealth (HOSPITAL_COMMUNITY): Payer: Self-pay | Admitting: Cardiology

## 2023-05-22 NOTE — Telephone Encounter (Signed)
Patient left message on triage line Reports she is not feeling well Reports hands and feet are cold Limbs are weak Nausea   Returned call for additional details No answer LMOM

## 2023-05-22 NOTE — Progress Notes (Signed)
ADVANCED HF CLINIC NOTE  PCP: Kennith Maes, PA-C Primary Cardiologist: Dr. Jacinto Halim HF Cardiologist: Dr. Gala Romney   HPI: Alicia Cook is a 57 y.o. 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  Developed HF in 2017. Echo at that time EF 25-30%, normal RV. LHC in 2017 and 2022 showed normal coronaries. S/p BiV ICD. Also w/ pulmonary sarcoid. No cardiac evolvement, had negative myocardial biopsy in 2017 and negative cardiac PET in 2022. Also h/o    She has struggled w/ worsening HF w/ worsening fatigue/ exercise tolerance, inability to tolerate GDMT and continued wt loss/ cardiac cachexia and felt to be end-stage.   Admitted for worsening HF in 6/24. Echo 6/24 EF < 10% LV massively dilated, G2 DD, mild MR.  RHC/LHC 04/11/23 No CAD.  Ao = 102/65 (78) LV = 92/23 RA =  6 RV = 32/10 PA = 32/14 (22) PCW = 11 Fick cardiac output/index = 4.8/2.4 PVR = 2.2 WU Ao sat = 97% PA sat = 66%, 69% PAPi = 3.0  Post hospital follow up, NYHA IIIb-IV symptoms, volume OK. Dig decreased with elevated level, no arrhythmias on ICD interrogation. CPX arranged and referred to [facility] for transplant/VAD work up.  Unfortunately admitted 04/22/23 to [facility] s/p ICD shock for VT.  Started on amiodarone gtt. Device interrogation showed VF event with ICD shock on 04/22/23 and AF. Ivabradine stopped. No ARB/ARNi with hypotension. Echo showed  EF < 15%. cMRI showed LVEF 10-15% with severe global HK, RV moderately-severely down, mild to moderate MR and AI, no evidence of sarcoidosis, No further VT while admitted. She was officially listed for transplant. Amio and Marcelline Deist stopped in anticipation of transplant ,and NPO status. She was started on warfarin for [facility] in setting of new atrial fibrillation and she was discharged home, weight 208 lbs.  Today she returns for post hospital HF follow up. Overall feeling fine. Felt bad a couple days ago,  2/2 to coming off Klonopin. Struggling with anxiety over health issues, PCP managing. Previously worked as a Clinical biochemist. Awaiting on disability. Abdomen is swelling, she has SOB walking her dog in the mornings. Denies palpitations, abnormal bleeding, CP, dizziness,  or PND/Orthopnea. Appetite ok. No fever or chills. Weight at home 201 pounds. Taking all medications. She has 5 children, she lives in an apartment with her pregnant dog. Anxious about transplant process.  Cardiac Studies - cMRI (6/24): LVEF 10-15% with severe global HK, RV moderately-severely down, mild to moderate MR and AI, no evidence of sarcoidosis.  - Echo (6/24 at [facility]): EF < 15%  - R/LHC (6/24): normal cors, severe NICM EF < 10% RA 6, PA 32/14 (22), PCWP 11, CO/CI (Fick) 4.8/2.4, PVR 2.2 WU, PAPi 3   - Echo (6/24): EF < 10%, LV massively dilated,  G2 DD, mild MR.  - LHC in 2017 and 2022: normal cors  - Echo (2017):  EF 25-30%, normal RV.  ROS: All systems negative except as listed in HPI, PMH and Problem List.  SH:  Social History   Socioeconomic History   Marital status: Single    Spouse name: Not on file   Number of children: 5   Years of education: Not on file   Highest education level: Not on file  Occupational History   Not on file  Tobacco Use   Smoking status: Former    Current packs/day: 0.00    Average packs/day: 1 pack/day for 20.0 years (  20.0 ttl pk-yrs)    Types: Cigarettes    Start date: 77    Quit date: 2016    Years since quitting: 8.5   Smokeless tobacco: Never  Vaping Use   Vaping status: Never Used  Substance and Sexual Activity   Alcohol use: No    Alcohol/week: 0.0 standard drinks of alcohol   Drug use: No   Sexual activity: Not on file  Other Topics Concern   Not on file  Social History Narrative   Not on file   Social Determinants of Health   Financial Resource Strain: High Risk (05/07/2023)   Overall Financial Resource Strain (CARDIA)    Difficulty of Paying Living Expenses:  Hard  Food Insecurity: No Food Insecurity (04/23/2023)   Received from [facility], [facility]   Hunger Vital Sign    Worried About Running Out of Food in the Last Year: Never true    Ran Out of Food in the Last Year: Never true  Transportation Needs: No Transportation Needs (04/24/2023)   Received from [facility], [facility]   [facility] - Transportation    In the past 12 months, has lack of transportation kept you from medical appointments or from getting medications?: No    Lack of Transportation (Non-Medical): No  Physical Activity: Not on file  Stress: Not on file  Social Connections: Unknown (05/08/2023)   Received from [facility], [facility]   Social Network    Social Network: Not on file  Intimate Partner Violence: Unknown (05/08/2023)   Received from [facility], [facility]   HITS    Physically Hurt: Not on file    Insult or Talk Down To: Not on file    Threaten Physical Harm: Not on file    Scream or Curse: Not on file   FH:  Family History  Problem Relation Age of Onset   Heart attack Father        a. Initial onset in his 36's. S/p CABG   Heart failure Father    Past Medical History:  Diagnosis Date   Cataract    CHF (congestive heart failure) (HCC)    Depression    Diabetes mellitus without complication (HCC)    Encounter for adjustment of biventricular implantable cardioverter-defibrillator (ICD) 06/13/2019   GERD (gastroesophageal reflux disease)    Hypertension    ICD: Cardiac defibrillator in situ 01/13/2015   [employer] Bi-V ICD (Dynagen X4 CRT-D) 01/13/2015 at [facility] Comp[facility]  Scheduled Remote ICD check 4.22.20: 1 SVT/8 secs, normal function. Battery longevity is 8 years. RA pacing is 1 %, RV pacing is 99 %, and LV pacing is 99 %.   Nonischemic cardiomyopathy (HCC)    a. EF 35-40% by cath in 2011 with normal cors b. s/p ICD implantation in 12/2014 c. EF  25% by NST in 02/01/2016   Sarcoidosis    Sarcoidosis 02/08/2016   Current Outpatient Medications  Medication Sig Dispense Refill   digoxin (LANOXIN) 0.125 MG tablet Take 1 tablet (0.125 mg total) by mouth daily. 30 tablet 5   Ferrous Sulfate (IRON PO) Take 1 tablet by mouth daily.     folic acid (FOLVITE) 1 MG tablet Take 1 mg by mouth daily with lunch.     magnesium hydroxide (MILK OF MAGNESIA) 400 MG/5ML suspension Take 30 mLs by mouth daily as needed for mild constipation.     MAGnesium-Oxide 400 (240 Mg) MG tablet Take 1 tablet (  400 mg total) by mouth daily. 90 tablet 3   Methotrexate 25 MG/ML SOSY Inject 1 mL into the skin once a week.     metoprolol succinate (TOPROL-XL) 25 MG 24 hr tablet TAKE 1 TABLET BY MOUTH AT BEDTIME. TAKE WITH OR IMMEDIATELY FOLLOWING A MEAL. 90 tablet 3   Multiple Vitamin (MULTIVITAMIN WITH MINERALS) TABS tablet Take 1 tablet by mouth daily with lunch.     pantoprazole (PROTONIX) 40 MG tablet Take 40 mg by mouth daily as needed (acid reflux).     Potassium Chloride ER 20 MEQ TBCR Take 1 tablet (20 mEq total) by mouth daily.     sertraline (ZOLOFT) 100 MG tablet Take 150 mg by mouth daily.     spironolactone (ALDACTONE) 25 MG tablet Take 1 tablet (25 mg total) by mouth daily. 90 tablet 3   tirzepatide (MOUNJARO) 10 MG/0.5ML Pen Inject 10 mg into the skin once a week.     torsemide (DEMADEX) 20 MG tablet Take 1 tablet (20 mg total) by mouth daily. 30 tablet 5   No current facility-administered medications for this encounter.   BP 112/82   Pulse 92   Wt 91.3 kg (201 lb 3.2 oz)   LMP 09/14/1991 (Within Weeks)   SpO2 94%   BMI 31.51 kg/m   Wt Readings from Last 3 Encounters:  05/23/23 91.3 kg (201 lb 3.2 oz)  04/17/23 91.8 kg (202 lb 6.4 oz)  04/12/23 92.6 kg (204 lb 1.6 oz)   PHYSICAL EXAM: General:  NAD. No resp difficulty, walked into clinic, fatigued-appearing HEENT: Normal Neck: Supple. JVP 7-8. Carotids 2+ bilat; no bruits. No lymphadenopathy or  thryomegaly appreciated. Cor: PMI nondisplaced. Regular rate & rhythm. No rubs, gallops or murmurs. Lungs: Clear Abdomen: + mild distention, soft, nontender. No hepatosplenomegaly. No bruits or masses. Good bowel sounds. Extremities: No cyanosis, clubbing, rash, edema Neuro: Alert & oriented x 3, cranial nerves grossly intact. Moves all 4 extremities w/o difficulty. Affect pleasant.  ReDs: 37%  ECG (personally reviewed): a sensed, v paced, BiV paced  Device interrogation (personally reviewed): Average HR 96 bpm, no recent AT/AF, no VT or VF since 04/24/23  ASSESSMENT & PLAN:  1. Chronic, End-Staged, Systolic Heart Failure/ Biventricular Dysfunction  - NICM since 2017. LHCs 2017 and 2022 showed no CAD - Echo 2017 EF 25-30%, normal RV - LBBB, s/p BiV ICD  - Echo 08/2022 EF 20-25%, mod-severe MR, RV mod reduced  - Echo 02/2023 EF 20-25%, mod-severe MR - Cardiac PET (-) for sarcoid - RHC (04/11/23) with severe NICM EF <20%, RA 6, PA 32/14 (22), PCW 11, Fick CO/CI 4.8/2.4, PVR 2.2, PAPi 3 - [facility] (6[facility]24): LVEF 10-15% with severe global HK, RV moderately-severely down, mild to moderate MR and AI, no evidence of sarcoidosis. - Improved NYHA III, volume up a bit, REDs 37% - Increase torsemide to 40 mg daily alternating with 20 mg every other day, take extra 20 KCL on 40 mg torsemide days. - Continue spironolactone 25 mg daily.  - Continue Toprol XL 25 mg daily  - Continue digoxin 0.125 mg daily. - Off ivabradine with new AF - BP too soft for ARB/ARNi - Has CPX ordered, needs to complete. - Now followed by [facility] for transplant, officially listed. Family support will be an issue. She has a lof of questions about the transplant process, I personally called Sam, her transplant coordinator and he has been actively reaching out to her.  - Body mass index is 31.51 kg/m. -  ICD interrogation as above - Labs today, repeat BMET in 7 days  2. Syncope/VF - 1 ICD shock for VF 04/22/23-->started on  amio - No further events - Continue beta blocker and digoxin - Now off amio in anticipation of transplant   3. Pulmonary Sarcoid  - No cardiac involvement, negative myocardial biopsy in 2017 and negative cardiac PET 2022    4. Type 2DM  - Hgb A1c 6.0 (6/24) - Off Farxiga in atticipation of transplant   5. HTN - Controlled on current regimen - GDMT per above   6. AF - Newly diagnosed on device interrogation 03/2023. - Off amiodarone in anticipation of transplant - Now on warfarin. ? PCP following INR, goal 2-3 - Check INR today and Refer to Coumadin Clinic.  Follow up next month with Dr. Gala Romney, as scheduled.  Jacklynn Ganong, FNP  3:14 PM

## 2023-05-22 NOTE — Telephone Encounter (Signed)
Spoke with patient who states started not feeling well yesterday   Reports that hands and feet are cold, very weak and states the crease of her eyes "feel weird"  Patient reports not missing any cardiac medications. Reports she did miss several doses of her Clonazepam and reports she now has this since getting her refill yesterday.   Patient denies any chest pain, shortness of breath, or swelling.   Vitals today 113/76 HR 90 O2 96% and reports blood sugar 112   Patient reports she hasn't ever felt like this before. Patient has appointment tomorrow already with PA/NP here in the HF clinic. Patient would like to know if there are any recommendations for her in the mean time. Advised patient I would forward message to provider- patient verbalized understanding.

## 2023-05-22 NOTE — Telephone Encounter (Signed)
Alicia Cook, [place], [place]  [facility] Triage Pool9 minutes ago (2:42 PM)    I am sorry she is feeling poorly, we can address at visit tomorrow. If she continues to decline, needs to seek eval in ED  Returned call to patient and made her aware of the above. Made patient aware of ED precautions should new or worsening symptoms develop. Patient verbalized understanding.

## 2023-05-23 ENCOUNTER — Ambulatory Visit (HOSPITAL_COMMUNITY)
Admission: RE | Admit: 2023-05-23 | Discharge: 2023-05-23 | Disposition: A | Discharge status: Home / Self Care | Payer: Medicaid Other | Source: Ambulatory Visit | Attending: Family Medicine | Admitting: Family Medicine

## 2023-05-23 ENCOUNTER — Telehealth: Payer: Self-pay | Admitting: Cardiology

## 2023-05-23 ENCOUNTER — Encounter (HOSPITAL_COMMUNITY): Payer: Self-pay

## 2023-05-23 VITALS — BP 112/82 | HR 92 | Wt 201.2 lb

## 2023-05-23 DIAGNOSIS — I10 Essential (primary) hypertension: Secondary | ICD-10-CM

## 2023-05-23 DIAGNOSIS — E119 Type 2 diabetes mellitus without complications: Secondary | ICD-10-CM | POA: Insufficient documentation

## 2023-05-23 DIAGNOSIS — D860 Sarcoidosis of lung: Secondary | ICD-10-CM | POA: Insufficient documentation

## 2023-05-23 DIAGNOSIS — I110 Hypertensive heart disease with heart failure: Secondary | ICD-10-CM | POA: Insufficient documentation

## 2023-05-23 DIAGNOSIS — I4901 Ventricular fibrillation: Secondary | ICD-10-CM | POA: Diagnosis not present

## 2023-05-23 DIAGNOSIS — I5082 Biventricular heart failure: Secondary | ICD-10-CM | POA: Diagnosis present

## 2023-05-23 DIAGNOSIS — R55 Syncope and collapse: Secondary | ICD-10-CM | POA: Diagnosis not present

## 2023-05-23 DIAGNOSIS — I428 Other cardiomyopathies: Secondary | ICD-10-CM | POA: Diagnosis not present

## 2023-05-23 DIAGNOSIS — I5022 Chronic systolic (congestive) heart failure: Secondary | ICD-10-CM | POA: Diagnosis not present

## 2023-05-23 DIAGNOSIS — I4891 Unspecified atrial fibrillation: Secondary | ICD-10-CM | POA: Diagnosis not present

## 2023-05-23 DIAGNOSIS — D8689 Sarcoidosis of other sites: Secondary | ICD-10-CM

## 2023-05-23 DIAGNOSIS — Z794 Long term (current) use of insulin: Secondary | ICD-10-CM

## 2023-05-23 DIAGNOSIS — Z7901 Long term (current) use of anticoagulants: Secondary | ICD-10-CM | POA: Diagnosis not present

## 2023-05-23 DIAGNOSIS — I480 Paroxysmal atrial fibrillation: Secondary | ICD-10-CM

## 2023-05-23 LAB — BASIC METABOLIC PANEL
Anion gap: 5 (ref 5–15)
BUN: 17 mg/dL (ref 6–20)
CO2: 29 mmol/L (ref 22–32)
Calcium: 9.2 mg/dL (ref 8.9–10.3)
Chloride: 105 mmol/L (ref 98–111)
Creatinine, Ser: 0.83 mg/dL (ref 0.44–1.00)
GFR, Estimated: >60 mL/min (ref >60)
Glucose, Bld: 98 mg/dL (ref 70–99)
Potassium: 4.1 mmol/L (ref 3.5–5.1)
Sodium: 139 mmol/L (ref 135–145)

## 2023-05-23 LAB — DIGOXIN LEVEL: Digoxin Level: 0.4 ng/mL — ABNORMAL LOW (ref 0.8–2.0)

## 2023-05-23 LAB — PROTIME-INR
INR: 4.8 (ref 0.8–1.2)
Prothrombin Time: 45.5 seconds — ABNORMAL HIGH (ref 11.4–15.2)

## 2023-05-23 LAB — BRAIN NATRIURETIC PEPTIDE: B Natriuretic Peptide: 340.5 pg/mL — ABNORMAL HIGH (ref 0.0–100.0)

## 2023-05-23 MED ORDER — TORSEMIDE 20 MG PO TABS: ORAL_TABLET | ORAL | 5 refills | Status: DC

## 2023-05-23 MED ORDER — POTASSIUM CHLORIDE ER 20 MEQ PO TBCR: EXTENDED_RELEASE_TABLET | ORAL | 3 refills | Status: DC

## 2023-05-23 NOTE — Telephone Encounter (Signed)
On-call outpatient service line:   I was notified that patient had critical lab value of a INR of 4.9.  Discussed with Dr. Gala Romney who recommends holding warfarin dose.  This transcription will also be sent to the pharmacist as well as him.  Patient was reached and understands to stop taking warfarin unless otherwise specified.  Patient denies any significant bleeding or bruising.

## 2023-05-23 NOTE — Patient Instructions (Addendum)
Medication Changes:  INCREASE TORSEMIDE TO 60MG  DAILY AND THEN ALTERNATE WITH TORSEMIDE 40MG  EVERY OTHER DAY   PLEASE TAKE POTASSIUM (2 TABLETS) ON THE DAYS YOU TAKE 60MG  TORSEMIDE AND PLEASE TAKE POTASSIUM (1 TABLET)  ON THE DAYS YOUR TAKING 40MG  TORSEMIDE   Lab Work:  Labs done today, your results will be available in MyChart, we will contact you for abnormal readings.  THEN RETURN FOR REPEAT LAB WORK IN 10 DAYS AS SCHEDULED   Referrals:  TO COUMADIN CLINIC FOR INR MONITORING- SOMEONE WILL CALL YOU IN REGARD TO THIS   Follow-Up in: AS SCHEDULED WITH DR. Gala Romney   At the [facility], you and your health needs are our priority. We have a designated team specialized in the treatment of Heart Failure. This Care Team includes your primary Heart Failure Specialized Cardiologist (physician), Advanced Practice Providers (APPs- Physician Assistants and Nurse Practitioners), and Pharmacist who all work together to provide you with the care you need, when you need it.   You may see any of the following providers on your designated Care Team at your next follow up:  Dr. Arvilla Meres Dr. Marca Ancona Dr. Marcos Eke, NP Robbie Lis, [place] [facility] [place], [place] Brynda Peon, NP Karle Plumber, PharmD   Please be sure to bring in all your medications bottles to every appointment.   Need to Contact us:  If you have any questions or concerns before your next appointment please send [place] a message through [place] or call our office at [phone].    TO LEAVE A MESSAGE FOR THE NURSE SELECT OPTION 2, PLEASE LEAVE A MESSAGE INCLUDING: YOUR NAME DATE OF BIRTH CALL BACK NUMBER REASON FOR CALL**this is important as we prioritize the call backs  YOU WILL RECEIVE A CALL BACK THE SAME DAY AS LONG AS YOU CALL BEFORE 4:00 PM

## 2023-05-23 NOTE — Progress Notes (Signed)
ReDS Vest / Clip - 05/23/23 1500       ReDS Vest / Clip   Station Marker C    Ruler Value 28    ReDS Value Range Moderate volume overload    ReDS Actual Value 37

## 2023-05-24 ENCOUNTER — Telehealth (HOSPITAL_COMMUNITY): Payer: Self-pay

## 2023-05-24 MED ORDER — TORSEMIDE 20 MG PO TABS: ORAL_TABLET | ORAL | Status: DC

## 2023-05-24 NOTE — Telephone Encounter (Signed)
Per Shanda Bumps NP, confirm torsemide dosage and if patient is taking 20mg - increase to 40mg  one day alternating with 20mg  the other day.   Patient confirmed she is taking Torsemide 20mg  once daily- advised her of the above- increase to 40mg  one day alternating with 20mg  every other day.   Patient aware of instructions and verbalized understanding.   Will also check on potassium dosage and see if this needs to be decreased as well. Will forward to provider.

## 2023-05-25 ENCOUNTER — Other Ambulatory Visit: Payer: Self-pay

## 2023-05-25 ENCOUNTER — Telehealth: Payer: Self-pay

## 2023-05-25 MED ORDER — WARFARIN SODIUM 5 MG PO TABS: ORAL_TABLET | ORAL | 1 refills | Status: DC

## 2023-05-25 NOTE — Telephone Encounter (Signed)
Lpmtcb and discuss INR result and schedule New coumadin visit.

## 2023-05-25 NOTE — Telephone Encounter (Signed)
Pt aware.

## 2023-05-25 NOTE — Telephone Encounter (Signed)
  Anderson Malta [place], FNP 05/25/2023  8:39 AM EDT Back to Top    Please have her take 40 KCL daily alternating with 20 every other day. Thanks!    Attempted to call patient, left message for patient to call back to office.

## 2023-05-25 NOTE — Telephone Encounter (Signed)
I spoke to the patient who is now going to be managed by [place] for her Coumadin.  I told her to HOLD Coumadin today, along with last night due to her INR of 4.8 (Goal 2-3).  She is scheduled for New Coumadin appt Wednesday 7/31 @ 2:30.  She also mentioned that she is not sure of her dose, but will try to find her medication since it is not on her medication list.

## 2023-05-27 ENCOUNTER — Other Ambulatory Visit: Payer: Self-pay | Admitting: Cardiology

## 2023-05-27 DIAGNOSIS — I5022 Chronic systolic (congestive) heart failure: Secondary | ICD-10-CM

## 2023-05-29 ENCOUNTER — Telehealth (HOSPITAL_COMMUNITY): Payer: Self-pay

## 2023-05-29 ENCOUNTER — Telehealth: Payer: Self-pay | Admitting: *Deleted

## 2023-05-29 ENCOUNTER — Encounter (HOSPITAL_COMMUNITY): Payer: Self-pay

## 2023-05-29 ENCOUNTER — Ambulatory Visit (HOSPITAL_COMMUNITY)
Admission: RE | Admit: 2023-05-29 | Discharge: 2023-05-29 | Disposition: A | Discharge status: Home / Self Care | Payer: Medicaid Other | Source: Ambulatory Visit | Attending: Family Medicine | Admitting: Family Medicine

## 2023-05-29 VITALS — BP 108/68 | HR 101 | Wt 196.2 lb

## 2023-05-29 DIAGNOSIS — Z8249 Family history of ischemic heart disease and other diseases of the circulatory system: Secondary | ICD-10-CM | POA: Diagnosis not present

## 2023-05-29 DIAGNOSIS — Z7901 Long term (current) use of anticoagulants: Secondary | ICD-10-CM | POA: Diagnosis not present

## 2023-05-29 DIAGNOSIS — I5022 Chronic systolic (congestive) heart failure: Secondary | ICD-10-CM | POA: Diagnosis present

## 2023-05-29 DIAGNOSIS — I447 Left bundle-branch block, unspecified: Secondary | ICD-10-CM | POA: Diagnosis not present

## 2023-05-29 DIAGNOSIS — R5383 Other fatigue: Secondary | ICD-10-CM | POA: Insufficient documentation

## 2023-05-29 DIAGNOSIS — E119 Type 2 diabetes mellitus without complications: Secondary | ICD-10-CM | POA: Diagnosis not present

## 2023-05-29 DIAGNOSIS — Z7682 Awaiting organ transplant status: Secondary | ICD-10-CM | POA: Diagnosis not present

## 2023-05-29 DIAGNOSIS — Z9884 Bariatric surgery status: Secondary | ICD-10-CM | POA: Insufficient documentation

## 2023-05-29 DIAGNOSIS — Z87891 Personal history of nicotine dependence: Secondary | ICD-10-CM | POA: Diagnosis not present

## 2023-05-29 DIAGNOSIS — D860 Sarcoidosis of lung: Secondary | ICD-10-CM | POA: Diagnosis not present

## 2023-05-29 DIAGNOSIS — Z95810 Presence of automatic (implantable) cardiac defibrillator: Secondary | ICD-10-CM | POA: Diagnosis not present

## 2023-05-29 DIAGNOSIS — I428 Other cardiomyopathies: Secondary | ICD-10-CM | POA: Insufficient documentation

## 2023-05-29 DIAGNOSIS — I110 Hypertensive heart disease with heart failure: Secondary | ICD-10-CM | POA: Insufficient documentation

## 2023-05-29 DIAGNOSIS — I5084 End stage heart failure: Secondary | ICD-10-CM | POA: Insufficient documentation

## 2023-05-29 DIAGNOSIS — Z5986 Financial insecurity: Secondary | ICD-10-CM | POA: Diagnosis not present

## 2023-05-29 DIAGNOSIS — G4733 Obstructive sleep apnea (adult) (pediatric): Secondary | ICD-10-CM | POA: Insufficient documentation

## 2023-05-29 DIAGNOSIS — I4901 Ventricular fibrillation: Secondary | ICD-10-CM

## 2023-05-29 DIAGNOSIS — Z79899 Other long term (current) drug therapy: Secondary | ICD-10-CM | POA: Diagnosis not present

## 2023-05-29 DIAGNOSIS — Z794 Long term (current) use of insulin: Secondary | ICD-10-CM

## 2023-05-29 DIAGNOSIS — I480 Paroxysmal atrial fibrillation: Secondary | ICD-10-CM

## 2023-05-29 DIAGNOSIS — I10 Essential (primary) hypertension: Secondary | ICD-10-CM

## 2023-05-29 DIAGNOSIS — D8689 Sarcoidosis of other sites: Secondary | ICD-10-CM

## 2023-05-29 LAB — BASIC METABOLIC PANEL
Anion gap: 9 (ref 5–15)
BUN: 14 mg/dL (ref 6–20)
CO2: 27 mmol/L (ref 22–32)
Calcium: 9.6 mg/dL (ref 8.9–10.3)
Chloride: 102 mmol/L (ref 98–111)
Creatinine, Ser: 0.93 mg/dL (ref 0.44–1.00)
GFR, Estimated: >60 mL/min (ref >60)
Glucose, Bld: 137 mg/dL — ABNORMAL HIGH (ref 70–99)
Potassium: 3.8 mmol/L (ref 3.5–5.1)
Sodium: 138 mmol/L (ref 135–145)

## 2023-05-29 LAB — BRAIN NATRIURETIC PEPTIDE: B Natriuretic Peptide: 287.3 pg/mL — ABNORMAL HIGH (ref 0.0–100.0)

## 2023-05-29 MED ORDER — TORSEMIDE 20 MG PO TABS: 20.0000 mg | ORAL_TABLET | Freq: Every day | ORAL | 6 refills | Status: AC

## 2023-05-29 MED ORDER — POTASSIUM CHLORIDE ER 20 MEQ PO TBCR: 20.0000 meq | EXTENDED_RELEASE_TABLET | Freq: Every day | ORAL | 6 refills | Status: AC

## 2023-05-29 NOTE — Patient Instructions (Addendum)
Thank you for coming in today  If you had labs drawn today, any labs that are abnormal the clinic will call you No news is good news Your labs were drawn for [facility] transplant team Medications: DECREASE Torsemide to 20 mg daily  DECREASE Potassium to 20 meq daily Hold Torsemide and Potassium today 05/29/2023  Follow up appointments: Labs were cancelled on 06/01/2023  Your physician recommends that you schedule a follow-up appointment in:  Keep follow up With Dr. Gala Romney 06/19/2023   Do the following things EVERYDAY: Weigh yourself in the morning before breakfast. Write it down and keep it in a log. Take your medicines as prescribed Eat low salt foods--Limit salt (sodium) to 2000 mg per day.  Stay as active as you can everyday Limit all fluids for the day to less than 2 liters   At the [facility], you and your health needs are our priority. As part of our continuing mission to provide you with exceptional heart care, we have created designated Provider Care Teams. These Care Teams include your primary Cardiologist (physician) and Advanced Practice Providers (APPs- Physician Assistants and Nurse Practitioners) who all work together to provide you with the care you need, when you need it.   You may see any of the following providers on your designated Care Team at your next follow up: Dr Arvilla Meres Dr Marca Ancona Dr. Marcos Eke, NP Robbie Lis, [place] [facility] [place], [place] Brynda Peon, NP Karle Plumber, PharmD   Please be sure to bring in all your medications bottles to every appointment.    Thank you for choosing [facility]  If you have any questions or concerns before your next appointment please send [place] a message through [place] or call our office at [phone].    TO LEAVE A MESSAGE FOR THE NURSE SELECT OPTION 2, PLEASE LEAVE A MESSAGE INCLUDING: YOUR NAME DATE OF  BIRTH CALL BACK NUMBER REASON FOR CALL**this is important as we prioritize the call backs  YOU WILL RECEIVE A CALL BACK THE SAME DAY AS LONG AS YOU CALL BEFORE 4:00 PM

## 2023-05-29 NOTE — Telephone Encounter (Addendum)
Pt called and stated that she has not been taking her warfarin cause she misplaced her paper that she had her instructions that were given to her on 05/25/23 via phone call. Advised she was supposed to hold her warfarin 2 days and She stated she held it longer and has not resumed.Then she stated she was not sure when her last dose of warfarin was. Educated her on adhering to taking her meds daily and not missing any dose. Advised to resume warfarin today and rescheduled appt to Friday. She was advised to be here at 230pm On Friday as this is important to adhere to appt and on time and she verbalized understanding.

## 2023-05-29 NOTE — Telephone Encounter (Signed)
Patient called to report that since Saturday she has been experiencing extreme fatigue, occasional headaches. She also reports that her limbs feel heavy but denies swelling. She was seen 7/24 in our office. Please advise.

## 2023-05-29 NOTE — Progress Notes (Signed)
ReDS Vest / Clip - 05/29/23 1400       ReDS Vest / Clip   Station Marker C    Ruler Value 33    ReDS Value Range Low volume    ReDS Actual Value 26

## 2023-05-29 NOTE — Progress Notes (Signed)
ADVANCED HF CLINIC NOTE  PCP: Kennith Maes, PA-C Primary Cardiologist: Dr. Jacinto Halim HF Cardiologist: Dr. Gala Romney   HPI: Alicia Cook is a 57 y.o. 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  Developed HF in 2017. Echo at that time EF 25-30%, normal RV. LHC in 2017 and 2022 showed normal coronaries. S/p BiV ICD. Also w/ pulmonary sarcoid. No cardiac evolvement, had negative myocardial biopsy in 2017 and negative cardiac PET in 2022. Also h/o    She has struggled w/ worsening HF w/ worsening fatigue/ exercise tolerance, inability to tolerate GDMT and continued wt loss/ cardiac cachexia and felt to be end-stage.   Admitted for worsening HF in 6/24. Echo 6/24 EF < 10% LV massively dilated, G2 DD, mild MR.  RHC/LHC 04/11/23 No CAD.  Ao = 102/65 (78) LV = 92/23 RA =  6 RV = 32/10 PA = 32/14 (22) PCW = 11 Fick cardiac output/index = 4.8/2.4 PVR = 2.2 WU Ao sat = 97% PA sat = 66%, 69% PAPi = 3.0  Post hospital follow up, NYHA IIIb-IV symptoms, volume OK. Dig decreased with elevated level, no arrhythmias on ICD interrogation. CPX arranged and referred to [facility] for transplant/VAD work up.  Unfortunately admitted 04/22/23 to [facility] s/p ICD shock for VT.  Started on amiodarone gtt. Device interrogation showed VF event with ICD shock on 04/22/23 and AF. Ivabradine stopped. No ARB/ARNi with hypotension. Echo showed  EF < 15%. cMRI showed LVEF 10-15% with severe global HK, RV moderately-severely down, mild to moderate MR and AI, no evidence of sarcoidosis, No further VT while admitted. She was officially listed for transplant. Amio and Marcelline Deist stopped in anticipation of transplant ,and NPO status. She was started on warfarin for [facility] in setting of new atrial fibrillation and she was discharged home, weight 208 lbs.  Post hospital follow up 05/23/23, NYHA III and volume up a bit. ReDs 37%. Torsemide increased to 40  daily alternating with 20 every other day.   Today she returns for an acute visit with complaints of extreme fatigue. Overall feeling fatigued, legs and arms feel heavy. She is dizzy and feels occasional palpitations. Denies abnormal bleeding, CP, edema, or PND/Orthopnea. Appetite poor, not drinking or eating much No fever or chills. Weight at home 196 pounds. Taking all medications. She has 5 children, she lives in an apartment with her pregnant dog. Anxious about transplant process.    Cardiac Studies - cMRI (6/24): LVEF 10-15% with severe global HK, RV moderately-severely down, mild to moderate MR and AI, no evidence of sarcoidosis.  - Echo (6/24 at [facility]): EF < 15%  - R/LHC (6/24): normal cors, severe NICM EF < 10% RA 6, PA 32/14 (22), PCWP 11, CO/CI (Fick) 4.8/2.4, PVR 2.2 WU, PAPi 3   - Echo (6/24): EF < 10%, LV massively dilated,  G2 DD, mild MR.  - LHC in 2017 and 2022: normal cors  - Echo (2017):  EF 25-30%, normal RV.  ROS: All systems negative except as listed in HPI, PMH and Problem List.  SH:  Social History   Socioeconomic History   Marital status: Single    Spouse name: Not on file   Number of children: 5   Years of education: Not on file   Highest education level: Not on file  Occupational History   Not on file  Tobacco Use   Smoking status: Former    Current packs/day: 0.00  Average packs/day: 1 pack/day for 20.0 years (20.0 ttl pk-yrs)    Types: Cigarettes    Start date: 41    Quit date: 2016    Years since quitting: 8.5   Smokeless tobacco: Never  Vaping Use   Vaping status: Never Used  Substance and Sexual Activity   Alcohol use: No    Alcohol/week: 0.0 standard drinks of alcohol   Drug use: No   Sexual activity: Not on file  Other Topics Concern   Not on file  Social History Narrative   Not on file   Social Determinants of Health   Financial Resource Strain: High Risk (05/07/2023)   Overall Financial Resource Strain (CARDIA)     Difficulty of Paying Living Expenses: Hard  Food Insecurity: No Food Insecurity (04/23/2023)   Received [facility], [facility]   Hunger Vital Sign    Worried About Running Out of Food in the Last Year: Never true    Ran Out of Food in the Last Year: Never true  Transportation Needs: No Transportation Needs (04/24/2023)   Received from [facility], [facility]   [facility] - Transportation    In the past 12 months, has lack of transportation kept you from medical appointments or from getting medications?: No    Lack of Transportation (Non-Medical): No  Physical Activity: Not on file  Stress: Not on file  Social Connections: Unknown (05/08/2023)   Received from [facility], [facility]   Social Network    Social Network: Not on file  Intimate Partner Violence: Unknown (05/08/2023)   Received from [facility], [facility]   HITS    Physically Hurt: Not on file    Insult or Talk Down To: Not on file    Threaten Physical Harm: Not on file    Scream or Curse: Not on file   FH:  Family History  Problem Relation Age of Onset   Heart attack Father        a. Initial onset in his 61's. S/p CABG   Heart failure Father    Past Medical History:  Diagnosis Date   Cataract    CHF (congestive heart failure) (HCC)    Depression    Diabetes mellitus without complication (HCC)    Encounter for adjustment of biventricular implantable cardioverter-defibrillator (ICD) 06/13/2019   GERD (gastroesophageal reflux disease)    Hypertension    ICD: Cardiac defibrillator in situ 01/13/2015   [employer] Bi-V ICD (Dynagen X4 CRT-D) 01/13/2015 at [facility] - MRI Compatible  Scheduled Remote ICD check 4.22.20: 1 SVT/8 secs, normal function. Battery longevity is 8 years. RA pacing is 1 %, RV pacing is 99 %, and LV pacing is 99 %.   Nonischemic cardiomyopathy (HCC)    a. EF 35-40% by cath in 2011 with normal cors b.  s/p ICD implantation in 12/2014 c. EF 25% by NST in 02/01/2016   Sarcoidosis    Sarcoidosis 02/08/2016   Current Outpatient Medications  Medication Sig Dispense Refill   digoxin (LANOXIN) 0.125 MG tablet Take 1 tablet (0.125 mg total) by mouth daily. 30 tablet 5   Ferrous Sulfate (IRON PO) Take 1 tablet by mouth daily.     folic acid (FOLVITE) 1 MG tablet Take 1 mg by mouth daily with lunch.     magnesium hydroxide (MILK OF MAGNESIA) 400 MG/5ML suspension Take 30 mLs by mouth daily as needed for mild constipation.     Methotrexate 25  MG/ML SOSY Inject 1 mL into the skin once a week.     metoprolol succinate (TOPROL-XL) 25 MG 24 hr tablet TAKE 1 TABLET BY MOUTH AT BEDTIME. TAKE WITH OR IMMEDIATELY FOLLOWING A MEAL. 90 tablet 3   Multiple Vitamin (MULTIVITAMIN WITH MINERALS) TABS tablet Take 1 tablet by mouth daily with lunch.     pantoprazole (PROTONIX) 40 MG tablet Take 40 mg by mouth daily as needed (acid reflux).     Potassium Chloride ER 20 MEQ TBCR TAKE 2 TABLETS ( ) ON THE DAYS YOU TAKE TORSEMIDE 60. TAKE 1 TABLET ( ) ON THE DAYS YOU TAKE TORSEMIDE 40MG  30 tablet 3   sertraline (ZOLOFT) 100 MG tablet Take 150 mg by mouth daily.     spironolactone (ALDACTONE) 25 MG tablet Take 1 tablet (25 mg total) by mouth daily. 90 tablet 3   tirzepatide (MOUNJARO) 10 MG/0.5ML Pen Inject 10 mg into the skin once a week.     torsemide (DEMADEX) 20 MG tablet TAKE 2 TABLETS (40MG ) ONE DAY ALTERNATING WITH  (20MG ) 1 TABLET DAILY     warfarin (COUMADIN) 5 MG tablet Take 1-2 tablets Daily or as prescribed by Coumadin Clinic (Patient not taking: Reported on 05/29/2023) 30 tablet 1   No current facility-administered medications for this encounter.   BP 108/68   Pulse (!) 101   Wt 89 kg (196 lb 3.2 oz)   LMP 09/14/1991 (Within Weeks)   SpO2 98%   BMI 30.73 kg/m   Wt Readings from Last 3 Encounters:  05/29/23 89 kg (196 lb 3.2 oz)  05/23/23 91.3 kg (201 lb 3.2 oz)  04/17/23 91.8 kg (202 lb 6.4  oz)   PHYSICAL EXAM: General:  NAD. No resp difficulty, walked into clinic, fatigued-appearing HEENT: Normal Neck: Supple. No JVD. Carotids 2+ bilat; no bruits. No lymphadenopathy or thryomegaly appreciated. Cor: PMI nondisplaced. Regular rate & rhythm. No rubs, gallops or murmurs. Lungs: Clear Abdomen: Soft, nontender, nondistended. No hepatosplenomegaly. No bruits or masses. Good bowel sounds. Extremities: No cyanosis, clubbing, rash, edema Neuro: Alert & oriented x 3, cranial nerves grossly intact. Moves all 4 extremities w/o difficulty. Affect pleasant.  ReDs: 37%-->26% today  Device interrogation (personally reviewed from 05/23/23): Average HR 96 bpm, no recent AT/AF, no VT or VF since 04/24/23  ASSESSMENT & PLAN:  1. Chronic, End-Staged, Systolic Heart Failure/ Biventricular Dysfunction  - NICM since 2017. LHCs 2017 and 2022 showed no CAD - Echo 2017 EF 25-30%, normal RV - LBBB, s/p BiV ICD  - Echo 08/2022 EF 20-25%, mod-severe MR, RV mod reduced  - Echo 02/2023 EF 20-25%, mod-severe MR - Cardiac PET (-) for sarcoid - RHC (04/11/23) with severe NICM EF <20%, RA 6, PA 32/14 (22), PCW 11, Fick CO/CI 4.8/2.4, PVR 2.2, PAPi 3 - [facility]MRI at [facility] (6/24): LVEF 10-15% with severe global HK, RV moderately-severely down, mild to moderate MR and AI, no evidence of sarcoidosis. - NYHA III, volume improved, weight down 5 lbs and ReDs 37%-->26% today. Suspect symptoms from over-diuresis. - Hold torsemide and KCL today. Liberalize fluids today. - Tomorrow, restart torsemide 20 mg daily + 20 KCL daily. - Continue spironolactone 25 mg daily.  - Continue Toprol XL 25 mg daily  - Continue digoxin 0.125 mg daily. Dig level 0.4 (05/23/23) - Off Farxiga in anticipation of NPO status when called for transplant. - Off ivabradine with new AF - BP too soft for ARB/ARNi - Now followed by [facility] for transplant  - Has CPX ordered. I discussed with  transplant coordinator, and confirmed she does not need to  complete this. - Labs 05/25/23 showed K 3.6, SCr 0.89 - Labs today.  2. Syncope/VF - 1 ICD shock for VF 04/22/23-->started on amio - No further events - Continue beta blocker and digoxin. - Now off amio in anticipation of transplant   3. Pulmonary Sarcoid  - No cardiac involvement, negative myocardial biopsy in 2017 and negative cardiac PET 2022    4. Type 2DM  - Hgb A1c 6.0 (6/24) - Off Farxiga in atticipation of transplant.   5. HTN - Controlled on current regimen - GDMT per above   6. AF - Newly diagnosed on device interrogation 03/2023. - Off amiodarone in anticipation of transplant - Now on warfarin. INR now followed by Coumadin Clinic  Follow up next month with Dr. Gala Romney, as scheduled.  Jacklynn Ganong, FNP  2:16 PM

## 2023-05-30 ENCOUNTER — Telehealth (HOSPITAL_COMMUNITY): Payer: Self-pay

## 2023-05-30 ENCOUNTER — Ambulatory Visit: Payer: Medicaid Other

## 2023-05-30 NOTE — Telephone Encounter (Signed)
[place], [place], [place]  You29 minutes ago (2:29 PM)    If she is feeling so poorly, advise evaluation in the ED   Returned call to patient- made her aware and patient verbalized understanding.

## 2023-05-30 NOTE — Telephone Encounter (Signed)
Received call from patient who states that she is having difficulty breathing. Patient states that she is short of breath, and reports that she having stomach pain. Patient reports that stomach pain started this morning. Patient reports the pain is at the top of her stomach in the middle. Patient reports that her blood pressure is 122/84 and heart rate is 68. Patient reports 1 pound weight gain. Patient reports she is nauseated as well. Patient reports these symptoms started this morning. Patient reports she has taken all of her medications as prescribed and reports that she hasn't been able to eat or drink. And most recent blood sugar was 128. Advised patient I would forward message over to provider for review. Patient verbalized understanding.

## 2023-06-01 ENCOUNTER — Other Ambulatory Visit: Payer: Self-pay

## 2023-06-01 ENCOUNTER — Other Ambulatory Visit (HOSPITAL_COMMUNITY): Payer: Medicaid Other

## 2023-06-01 ENCOUNTER — Ambulatory Visit: Payer: Medicaid Other | Attending: Cardiovascular Disease

## 2023-06-01 DIAGNOSIS — Z7901 Long term (current) use of anticoagulants: Secondary | ICD-10-CM | POA: Diagnosis not present

## 2023-06-01 DIAGNOSIS — I4891 Unspecified atrial fibrillation: Secondary | ICD-10-CM

## 2023-06-01 DIAGNOSIS — I5022 Chronic systolic (congestive) heart failure: Secondary | ICD-10-CM

## 2023-06-01 DIAGNOSIS — I480 Paroxysmal atrial fibrillation: Secondary | ICD-10-CM

## 2023-06-01 LAB — POCT INR: INR: 2.4 (ref 2.0–3.0)

## 2023-06-01 NOTE — Patient Instructions (Signed)
TAKE 1 TABLET DAILY.  INR in 1 week.  A full discussion of the nature of anticoagulants has been carried out.  A benefit risk analysis has been presented to the patient, so that they understand the justification for choosing anticoagulation at this time. The need for frequent and regular monitoring, precise dosage adjustment and compliance is stressed.  Side effects of potential bleeding are discussed.  The patient should avoid any OTC items containing aspirin or ibuprofen, and should avoid great swings in general diet.  Avoid alcohol consumption.  Call if any signs of abnormal bleeding.  [phone]  

## 2023-06-08 ENCOUNTER — Ambulatory Visit: Payer: Medicaid Other | Attending: Internal Medicine

## 2023-06-08 ENCOUNTER — Telehealth: Payer: Self-pay

## 2023-06-08 DIAGNOSIS — I480 Paroxysmal atrial fibrillation: Secondary | ICD-10-CM | POA: Diagnosis not present

## 2023-06-08 DIAGNOSIS — Z5181 Encounter for therapeutic drug level monitoring: Secondary | ICD-10-CM | POA: Diagnosis not present

## 2023-06-08 LAB — POCT INR: INR: 5 — AB (ref 2.0–3.0)

## 2023-06-08 NOTE — Telephone Encounter (Signed)
[facility] started pt on warfarin for afib during June hospitalization. She has Medicaid which covers Eliquis very well, no contraindicated meds noted. She did have gastric bypass in 2022 which can affect medication absorption, however Eliquis relies on this less than other DOACs (like Xarelto). She is pending transplant so not sure if that potentially affects using DOAC - will forward to CHF team to confirm if pt is able to change to Eliquis.

## 2023-06-08 NOTE — Telephone Encounter (Signed)
Pt came to [facility]madin Clinic appt and asked if there is anyway she could switch to Eliquis and also asked if patient assistance would be an option. Made pt aware I would send message to pharmacy and patient assistance team to determine if this was an option for her.

## 2023-06-08 NOTE — Patient Instructions (Signed)
Description   HOLD today's dose and HOLD tomorrow's dose and then START taking 1 tablet daily except 1/2 tablet on Tuesday, Thursday, and Saturday.  Recheck INR in 1 week. Coumadin Clinic [phone]

## 2023-06-08 NOTE — Telephone Encounter (Signed)
Called pt and provide her with this information. Pt states she will call [facility] and ask if she would continue to be eligible for heart transplant if she switched to Eliquis. I also made pt aware [facility] will need to send our office documentation if they are okay with her switching to Eliquis.

## 2023-06-15 ENCOUNTER — Ambulatory Visit: Payer: Medicaid Other

## 2023-06-15 DIAGNOSIS — I5022 Chronic systolic (congestive) heart failure: Secondary | ICD-10-CM

## 2023-06-15 DIAGNOSIS — Z7901 Long term (current) use of anticoagulants: Secondary | ICD-10-CM | POA: Diagnosis not present

## 2023-06-15 DIAGNOSIS — I480 Paroxysmal atrial fibrillation: Secondary | ICD-10-CM | POA: Diagnosis not present

## 2023-06-15 LAB — POCT INR: INR: 1.7 — AB (ref 2.0–3.0)

## 2023-06-15 NOTE — Patient Instructions (Signed)
TAKE 1.5 TABLETS TODAY ONLY THEN CONTINUE taking 1 tablet daily except 1/2 tablet on Tuesday, Thursday, and Saturday.  Recheck INR in 1 week. Coumadin Clinic [phone]

## 2023-06-17 NOTE — Progress Notes (Signed)
ADVANCED HF CLINIC NOTE  PCP: Kennith Maes, PA-C Primary Cardiologist: Dr. Jacinto Halim HF Cardiologist: Dr. Gala Romney   HPI: Alicia Cook is a 57 y.o. 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  Developed HF in 2017. Echo at that time EF 25-30%, normal RV. LHC in 2017 and 2022 showed normal coronaries. S/p BiV ICD. Also w/ pulmonary sarcoid. No cardiac evolvement, had negative myocardial biopsy in 2017 and negative cardiac PET in 2022. Also h/o    She has struggled w/ worsening HF w/ worsening fatigue/ exercise tolerance, inability to tolerate GDMT and continued wt loss/ cardiac cachexia and felt to be end-stage.   Admitted for worsening HF in 6/24. Echo 6/24 EF < 10% LV massively dilated, G2 DD, mild MR.  RHC/LHC 04/11/23 No CAD.  Ao = 102/65 (78) LV = 92/23 RA =  6 RV = 32/10 PA = 32/14 (22) PCW = 11 Fick cardiac output/index = 4.8/2.4 PVR = 2.2 WU Ao sat = 97% PA sat = 66%, 69% PAPi = 3.0  Post hospital follow up, NYHA IIIb-IV symptoms, volume OK. Dig decreased with elevated level, no arrhythmias on ICD interrogation. CPX arranged and referred to [facility] for transplant/VAD work up.  Unfortunately admitted 04/22/23 to [facility] s/p ICD shock for VT.  Started on amiodarone gtt. Device interrogation showed VF event with ICD shock on 04/22/23 and AF. Ivabradine stopped. No ARB/ARNi with hypotension. Echo showed  EF < 15%. cMRI showed LVEF 10-15% with severe global HK, RV moderately-severely down, mild to moderate MR and AI, no evidence of sarcoidosis, No further VT while admitted. She was officially listed for transplant. Amio and Marcelline Deist stopped in anticipation of transplant ,and NPO status. She was started on warfarin for [facility] in setting of new atrial fibrillation and she was discharged home, weight 208 lbs.  Post hospital follow up 05/23/23, NYHA III and volume up a bit. ReDs 37%. Torsemide increased to 40  daily alternating with 20 every other day.   Today she returns for an acute visit with complaints of extreme fatigue. Overall feeling fatigued, legs and arms feel heavy. She is dizzy and feels occasional palpitations. Denies abnormal bleeding, CP, edema, or PND/Orthopnea. Appetite poor, not drinking or eating much No fever or chills. Weight at home 196 pounds. Taking all medications. She has 5 children, she lives in an apartment with her pregnant dog. Anxious about transplant process.   Cardiac Studies - cMRI (6/24): LVEF 10-15% with severe global HK, RV moderately-severely down, mild to moderate MR and AI, no evidence of sarcoidosis.  - Echo (6/24 at [facility]): EF < 15%  - R/LHC (6/24): normal cors, severe NICM EF < 10% RA 6, PA 32/14 (22), PCWP 11, CO/CI (Fick) 4.8/2.4, PVR 2.2 WU, PAPi 3   - Echo (6/24): EF < 10%, LV massively dilated,  G2 DD, mild MR.  - LHC in 2017 and 2022: normal cors  - Echo (2017):  EF 25-30%, normal RV.  ROS: All systems negative except as listed in HPI, PMH and Problem List.  SH:  Social History   Socioeconomic History   Marital status: Single    Spouse name: Not on file   Number of children: 5   Years of education: Not on file   Highest education level: Not on file  Occupational History   Not on file  Tobacco Use   Smoking status: Former    Current packs/day: 0.00  Average packs/day: 1 pack/day for 20.0 years (20.0 ttl pk-yrs)    Types: Cigarettes    Start date: 43    Quit date: 2016    Years since quitting: 8.6   Smokeless tobacco: Never  Vaping Use   Vaping status: Never Used  Substance and Sexual Activity   Alcohol use: No    Alcohol/week: 0.0 standard drinks of alcohol   Drug use: No   Sexual activity: Not on file  Other Topics Concern   Not on file  Social History Narrative   Not on file   Social Determinants of Health   Financial Resource Strain: High Risk (05/07/2023)   Overall Financial Resource Strain (CARDIA)    Difficulty  of Paying Living Expenses: Hard  Food Insecurity: No Food Insecurity (04/23/2023)   Received from [facility], [facility]   Hunger Vital Sign    Worried About Running Out of Food in the Last Year: Never true    Ran Out of Food in the Last Year: Never true  Transportation Needs: No Transportation Needs (04/24/2023)   Received from [facility], [facility]   [facility] - Transportation    In the past 12 months, has lack of transportation kept you from medical appointments or from getting medications?: No    Lack of Transportation (Non-Medical): No  Physical Activity: Not on file  Stress: Not on file  Social Connections: Unknown (05/08/2023)   Received from [facility], [facility]   Social Network    Social Network: Not on file  Intimate Partner Violence: Unknown (05/08/2023)   Received from [facility], [facility]   HITS    Physically Hurt: Not on file    Insult or Talk Down To: Not on file    Threaten Physical Harm: Not on file    Scream or Curse: Not on file   FH:  Family History  Problem Relation Age of Onset   Heart attack Father        a. Initial onset in his 60's. S/p CABG   Heart failure Father    Past Medical History:  Diagnosis Date   Cataract    CHF (congestive heart failure) (HCC)    Depression    Diabetes mellitus without complication (HCC)    Encounter for adjustment of biventricular implantable cardioverter-defibrillator (ICD) 06/13/2019   GERD (gastroesophageal reflux disease)    Hypertension    ICD: Cardiac defibrillator in situ 01/13/2015   [employer] Bi-V ICD (Dynagen X4 CRT-D) 01/13/2015 at [facility] - MRI Compatible  Scheduled Remote ICD check 4.22.20: 1 SVT/8 secs, normal function. Battery longevity is 8 years. RA pacing is 1 %, RV pacing is 99 %, and LV pacing is 99 %.   Nonischemic cardiomyopathy (HCC)    a. EF 35-40% by cath in 2011 with normal cors b. s/p ICD  implantation in 12/2014 c. EF 25% by NST in 02/01/2016   Sarcoidosis    Sarcoidosis 02/08/2016   Current Outpatient Medications  Medication Sig Dispense Refill   digoxin (LANOXIN) 0.125 MG tablet Take 1 tablet (0.125 mg total) by mouth daily. 30 tablet 5   Ferrous Sulfate (IRON PO) Take 1 tablet by mouth daily.     folic acid (FOLVITE) 1 MG tablet Take 1 mg by mouth daily with lunch.     magnesium hydroxide (MILK OF MAGNESIA) 400 MG/5ML suspension Take 30 mLs by mouth daily as needed for mild constipation.     Methotrexate 25  MG/ML SOSY Inject 1 mL into the skin once a week.     metoprolol succinate (TOPROL-XL) 25 MG 24 hr tablet TAKE 1 TABLET BY MOUTH AT BEDTIME. TAKE WITH OR IMMEDIATELY FOLLOWING A MEAL. 90 tablet 3   Multiple Vitamin (MULTIVITAMIN WITH MINERALS) TABS tablet Take 1 tablet by mouth daily with lunch.     pantoprazole (PROTONIX) 40 MG tablet Take 40 mg by mouth daily as needed (acid reflux).     Potassium Chloride ER 20 MEQ TBCR Take 1 tablet (20 mEq total) by mouth daily. TAKE 2 TABLETS ( ) ON THE DAYS YOU TAKE TORSEMIDE 60. TAKE 1 TABLET ( ) ON THE DAYS YOU TAKE TORSEMIDE 40MG  30 tablet 6   sertraline (ZOLOFT) 100 MG tablet Take 150 mg by mouth daily.     spironolactone (ALDACTONE) 25 MG tablet Take 1 tablet (25 mg total) by mouth daily. 90 tablet 3   tirzepatide (MOUNJARO) 10 MG/0.5ML Pen Inject 10 mg into the skin once a week.     torsemide (DEMADEX) 20 MG tablet Take 1 tablet (20 mg total) by mouth daily. TAKE 2 TABLETS (40MG ) ONE DAY ALTERNATING WITH  (20MG ) 1 TABLET DAILY 30 tablet 6   warfarin (COUMADIN) 5 MG tablet Take 1-2 tablets Daily or as prescribed by Coumadin Clinic (Patient not taking: Reported on 05/29/2023) 30 tablet 1   No current facility-administered medications for this encounter.   LMP 09/14/1991 (Within Weeks)   Wt Readings from Last 3 Encounters:  05/29/23 89 kg (196 lb 3.2 oz)  05/23/23 91.3 kg (201 lb 3.2 oz)  04/17/23 91.8 kg (202 lb 6.4  oz)   PHYSICAL EXAM: General:  NAD. No resp difficulty, walked into clinic, fatigued-appearing HEENT: Normal Neck: Supple. No JVD. Carotids 2+ bilat; no bruits. No lymphadenopathy or thryomegaly appreciated. Cor: PMI nondisplaced. Regular rate & rhythm. No rubs, gallops or murmurs. Lungs: Clear Abdomen: Soft, nontender, nondistended. No hepatosplenomegaly. No bruits or masses. Good bowel sounds. Extremities: No cyanosis, clubbing, rash, edema Neuro: Alert & oriented x 3, cranial nerves grossly intact. Moves all 4 extremities w/o difficulty. Affect pleasant.  ReDs: 37%-->26% today  Device interrogation (personally reviewed from 05/23/23): Average HR 96 bpm, no recent AT/AF, no VT or VF since 04/24/23  ASSESSMENT & PLAN:  1. Chronic, End-Staged, Systolic Heart Failure/ Biventricular Dysfunction  - NICM since 2017. LHCs 2017 and 2022 showed no CAD - Echo 2017 EF 25-30%, normal RV - LBBB, s/p BiV ICD  - Echo 08/2022 EF 20-25%, mod-severe MR, RV mod reduced  - Echo 02/2023 EF 20-25%, mod-severe MR - Cardiac PET (-) for sarcoid - RHC (04/11/23) with severe NICM EF <20%, RA 6, PA 32/14 (22), PCW 11, Fick CO/CI 4.8/2.4, PVR 2.2, PAPi 3 - [facility]MRI at [facility] (6/24): LVEF 10-15% with severe global HK, RV moderately-severely down, mild to moderate MR and AI, no evidence of sarcoidosis. - NYHA III, volume improved, weight down 5 lbs and ReDs 37%-->26% today. Suspect symptoms from over-diuresis. - Hold torsemide and KCL today. Liberalize fluids today. - Tomorrow, restart torsemide 20 mg daily + 20 KCL daily. - Continue spironolactone 25 mg daily.  - Continue Toprol XL 25 mg daily  - Continue digoxin 0.125 mg daily. Dig level 0.4 (05/23/23) - Off Farxiga in anticipation of NPO status when called for transplant. - Off ivabradine with new AF - BP too soft for ARB/ARNi - Now followed by [facility] for transplant  - Has CPX ordered. I discussed with transplant coordinator, and confirmed she does not need  to  complete this. - Labs 05/25/23 showed K 3.6, SCr 0.89 - Labs today.  2. Syncope/VF - 1 ICD shock for VF 04/22/23-->started on amio - No further events - Continue beta blocker and digoxin. - Now off amio in anticipation of transplant   3. Pulmonary Sarcoid  - No cardiac involvement, negative myocardial biopsy in 2017 and negative cardiac PET 2022    4. Type 2DM  - Hgb A1c 6.0 (6/24) - Off Farxiga in atticipation of transplant.   5. HTN - Controlled on current regimen - GDMT per above   6. AF - Newly diagnosed on device interrogation 03/2023. - Off amiodarone in anticipation of transplant - Now on warfarin. INR now followed by Coumadin Clinic  Arvilla Meres, MD  6:22 PM

## 2023-06-19 ENCOUNTER — Ambulatory Visit (HOSPITAL_COMMUNITY)
Admission: RE | Admit: 2023-06-19 | Discharge: 2023-06-19 | Disposition: A | Discharge status: Home / Self Care | Payer: Medicaid Other | Source: Ambulatory Visit | Attending: Internal Medicine | Admitting: Internal Medicine

## 2023-06-19 ENCOUNTER — Encounter (HOSPITAL_COMMUNITY): Payer: Self-pay | Admitting: Internal Medicine

## 2023-06-19 VITALS — BP 130/62 | HR 73 | Wt 191.2 lb

## 2023-06-19 DIAGNOSIS — R55 Syncope and collapse: Secondary | ICD-10-CM | POA: Insufficient documentation

## 2023-06-19 DIAGNOSIS — Z7682 Awaiting organ transplant status: Secondary | ICD-10-CM | POA: Diagnosis not present

## 2023-06-19 DIAGNOSIS — Z79899 Other long term (current) drug therapy: Secondary | ICD-10-CM | POA: Diagnosis not present

## 2023-06-19 DIAGNOSIS — I4901 Ventricular fibrillation: Secondary | ICD-10-CM | POA: Diagnosis not present

## 2023-06-19 DIAGNOSIS — E1136 Type 2 diabetes mellitus with diabetic cataract: Secondary | ICD-10-CM | POA: Insufficient documentation

## 2023-06-19 DIAGNOSIS — R0602 Shortness of breath: Secondary | ICD-10-CM | POA: Diagnosis not present

## 2023-06-19 DIAGNOSIS — R531 Weakness: Secondary | ICD-10-CM | POA: Insufficient documentation

## 2023-06-19 DIAGNOSIS — I447 Left bundle-branch block, unspecified: Secondary | ICD-10-CM | POA: Diagnosis not present

## 2023-06-19 DIAGNOSIS — D860 Sarcoidosis of lung: Secondary | ICD-10-CM | POA: Insufficient documentation

## 2023-06-19 DIAGNOSIS — Z95810 Presence of automatic (implantable) cardiac defibrillator: Secondary | ICD-10-CM | POA: Diagnosis not present

## 2023-06-19 DIAGNOSIS — E88A Wasting disease (syndrome) due to underlying condition: Secondary | ICD-10-CM | POA: Insufficient documentation

## 2023-06-19 DIAGNOSIS — Z7901 Long term (current) use of anticoagulants: Secondary | ICD-10-CM | POA: Diagnosis not present

## 2023-06-19 DIAGNOSIS — I4891 Unspecified atrial fibrillation: Secondary | ICD-10-CM

## 2023-06-19 DIAGNOSIS — R002 Palpitations: Secondary | ICD-10-CM | POA: Diagnosis present

## 2023-06-19 DIAGNOSIS — E669 Obesity, unspecified: Secondary | ICD-10-CM | POA: Diagnosis not present

## 2023-06-19 DIAGNOSIS — I110 Hypertensive heart disease with heart failure: Secondary | ICD-10-CM | POA: Diagnosis not present

## 2023-06-19 DIAGNOSIS — I5022 Chronic systolic (congestive) heart failure: Secondary | ICD-10-CM | POA: Insufficient documentation

## 2023-06-19 DIAGNOSIS — I428 Other cardiomyopathies: Secondary | ICD-10-CM | POA: Diagnosis not present

## 2023-06-19 LAB — BASIC METABOLIC PANEL
Anion gap: 12 (ref 5–15)
BUN: 16 mg/dL (ref 6–20)
CO2: 29 mmol/L (ref 22–32)
Calcium: 9.2 mg/dL (ref 8.9–10.3)
Chloride: 98 mmol/L (ref 98–111)
Creatinine, Ser: 0.94 mg/dL (ref 0.44–1.00)
GFR, Estimated: >60 mL/min (ref >60)
Glucose, Bld: 105 mg/dL — ABNORMAL HIGH (ref 70–99)
Potassium: 3.4 mmol/L — ABNORMAL LOW (ref 3.5–5.1)
Sodium: 139 mmol/L (ref 135–145)

## 2023-06-19 LAB — MAGNESIUM: Magnesium: 2 mg/dL (ref 1.7–2.4)

## 2023-06-19 MED ORDER — AMIODARONE HCL 200 MG PO TABS: 200.0000 mg | ORAL_TABLET | Freq: Every day | ORAL | 3 refills | Status: DC

## 2023-06-19 NOTE — Patient Instructions (Signed)
Medication Changes:  START Amiodarone 200 mg Daily  Lab Work:  Labs done today, your results will be available in MyChart, we will contact you for abnormal readings.  Testing/Procedures:  none  Referrals:  none  Special Instructions // Education:  Do the following things EVERYDAY: Weigh yourself in the morning before breakfast. Write it down and keep it in a log. Take your medicines as prescribed Eat low salt foods--Limit salt (sodium) to 2000 mg per day.  Stay as active as you can everyday Limit all fluids for the day to less than 2 liters   Follow-Up in: 4 months  At the [facility], you and your health needs are our priority. We have a designated team specialized in the treatment of Heart Failure. This Care Team includes your primary Heart Failure Specialized Cardiologist (physician), Advanced Practice Providers (APPs- Physician Assistants and Nurse Practitioners), and Pharmacist who all work together to provide you with the care you need, when you need it.   You may see any of the following providers on your designated Care Team at your next follow up:  Dr. Arvilla Meres Dr. Marca Ancona Dr. Marcos Eke, NP Robbie Lis, [place] [facility], [place] Brynda Peon, NP Karle Plumber, PharmD   Please be sure to bring in all your medications bottles to every appointment.   Need to Contact us:  If you have any questions or concerns before your next appointment please send [place] a message through [place] or call our office at [phone].    TO LEAVE A MESSAGE FOR THE NURSE SELECT OPTION 2, PLEASE LEAVE A MESSAGE INCLUDING: YOUR NAME DATE OF BIRTH CALL BACK NUMBER REASON FOR CALL**this is important as we prioritize the call backs  YOU WILL RECEIVE A CALL BACK THE SAME DAY AS LONG AS YOU CALL BEFORE 4:00 PM

## 2023-06-20 ENCOUNTER — Telehealth (HOSPITAL_COMMUNITY): Payer: Self-pay | Admitting: Cardiology

## 2023-06-20 NOTE — Telephone Encounter (Signed)
No significant interactions noted for the above OTC medications and her cardiac medications, these should be safe to take. However, she should check and see if there is any vitamin K in the multi-vitamin, as that could effect her INR. I would recommend she let her Coumadin Clinic know of any new medications she starts so they can monitor her INR closer if needed.

## 2023-06-20 NOTE — Telephone Encounter (Signed)
Pt called to question contraindications with multi-vitamin/supplements with current cardiac meds   *multi-vitamin (OTC) *collagen (OTC)

## 2023-06-21 ENCOUNTER — Ambulatory Visit: Payer: Medicaid Other | Attending: Cardiovascular Disease

## 2023-06-21 DIAGNOSIS — Z5181 Encounter for therapeutic drug level monitoring: Secondary | ICD-10-CM

## 2023-06-21 DIAGNOSIS — I480 Paroxysmal atrial fibrillation: Secondary | ICD-10-CM | POA: Diagnosis not present

## 2023-06-21 LAB — POCT INR: INR: 2.8 (ref 2.0–3.0)

## 2023-06-21 NOTE — Telephone Encounter (Signed)
Pt aware and voiced understanding 

## 2023-06-21 NOTE — Patient Instructions (Signed)
Description   Continue taking 1 tablet daily except 1/2 tablet on Tuesday, Thursday, and Saturday.  Recheck INR in 1 week. Coumadin Clinic [phone]

## 2023-06-22 ENCOUNTER — Ambulatory Visit: Payer: Medicaid Other

## 2023-06-28 ENCOUNTER — Ambulatory Visit: Payer: Medicaid Other | Attending: Cardiology | Admitting: *Deleted

## 2023-06-28 DIAGNOSIS — I480 Paroxysmal atrial fibrillation: Secondary | ICD-10-CM

## 2023-06-28 DIAGNOSIS — I5022 Chronic systolic (congestive) heart failure: Secondary | ICD-10-CM

## 2023-06-28 DIAGNOSIS — Z7901 Long term (current) use of anticoagulants: Secondary | ICD-10-CM

## 2023-06-28 LAB — POCT INR: INR: 3.7 — AB (ref 2.0–3.0)

## 2023-06-28 NOTE — Patient Instructions (Addendum)
Description   Do not take any warfarin today then START taking 1/2 tablet daily except 1 tablet on Monday, Wednesday, and Friday. (Started Amio 200mg  daily). Recheck INR in 1 week. Coumadin Clinic [phone]

## 2023-07-04 ENCOUNTER — Telehealth (HOSPITAL_COMMUNITY): Payer: Self-pay

## 2023-07-04 DIAGNOSIS — I5022 Chronic systolic (congestive) heart failure: Secondary | ICD-10-CM

## 2023-07-04 NOTE — Telephone Encounter (Signed)
-----   Message from Arvilla Meres sent at 06/28/2023  9:13 PM EDT ----- Can we have her take kcl 40 extra and repeat BMET next week please?

## 2023-07-04 NOTE — Telephone Encounter (Signed)
Patient advised and verbalized understanding,lab appointment scheduled,lab orders entered   Orders Placed This Encounter  Procedures   Basic metabolic panel    Standing Status:   Future    Standing Expiration Date:   07/03/2024    Order Specific Question:   Release to patient    Answer:   Immediate    Order Specific Question:   Release to patient    Answer:   Immediate [1]

## 2023-07-06 ENCOUNTER — Ambulatory Visit: Payer: Medicaid Other | Attending: Internal Medicine

## 2023-07-06 DIAGNOSIS — I480 Paroxysmal atrial fibrillation: Secondary | ICD-10-CM | POA: Diagnosis not present

## 2023-07-06 DIAGNOSIS — Z5181 Encounter for therapeutic drug level monitoring: Secondary | ICD-10-CM | POA: Diagnosis not present

## 2023-07-06 LAB — POCT INR: INR: 2.4 (ref 2.0–3.0)

## 2023-07-06 NOTE — Patient Instructions (Signed)
Description   Continue taking 1/2 tablet daily except 1 tablet on Monday, Wednesday, and Friday.  (Started Amio 200mg  daily).  Recheck INR in 1 week. Coumadin Clinic [phone]

## 2023-07-09 ENCOUNTER — Other Ambulatory Visit: Payer: Self-pay | Admitting: Cardiology

## 2023-07-09 DIAGNOSIS — I5022 Chronic systolic (congestive) heart failure: Secondary | ICD-10-CM

## 2023-07-11 ENCOUNTER — Other Ambulatory Visit (HOSPITAL_COMMUNITY): Payer: Medicaid Other

## 2023-07-11 ENCOUNTER — Telehealth (HOSPITAL_COMMUNITY): Payer: Self-pay | Admitting: Cardiology

## 2023-07-11 ENCOUNTER — Encounter (HOSPITAL_COMMUNITY): Payer: Self-pay | Admitting: Cardiology

## 2023-07-11 NOTE — Telephone Encounter (Signed)
Yes, she can take Nyquil. It contains acetaminophen, dextromethorphan, and doxylamine, which are all ok for her to take. She should avoid anything with a decongestant in it (I.e. pseudoephedrine or phenylephrine), but these ingredients are not in Nyquil.

## 2023-07-11 NOTE — Telephone Encounter (Signed)
Pt aware and voiced understanding  Will send via mychart message as well

## 2023-07-11 NOTE — Telephone Encounter (Signed)
Sinus infection Post nasal drip Yellow phlegm   -has used prednisone packs in the past for sinus infections -urgent care provider wanted to prescribe abx 9/10 however did not write due to complex history (cardiac history and transplant list )    Pt is currently using mucinex, would like to take nyquil for relief  -advised would review with providers as nyquil is not listed on HF OTC meds

## 2023-07-13 ENCOUNTER — Ambulatory Visit: Payer: Medicaid Other

## 2023-07-17 ENCOUNTER — Encounter: Payer: Self-pay | Admitting: Internal Medicine

## 2023-07-17 ENCOUNTER — Other Ambulatory Visit (HOSPITAL_COMMUNITY): Payer: Self-pay

## 2023-07-17 ENCOUNTER — Ambulatory Visit (HOSPITAL_COMMUNITY)
Admission: RE | Admit: 2023-07-17 | Discharge: 2023-07-17 | Disposition: A | Discharge status: Home / Self Care | Payer: Medicaid Other | Source: Ambulatory Visit | Attending: Cardiology | Admitting: Cardiology

## 2023-07-17 ENCOUNTER — Encounter (HOSPITAL_COMMUNITY): Payer: Self-pay

## 2023-07-17 DIAGNOSIS — I5022 Chronic systolic (congestive) heart failure: Secondary | ICD-10-CM

## 2023-07-18 NOTE — Telephone Encounter (Signed)
This encounter was created in error - please disregard.

## 2023-07-20 ENCOUNTER — Ambulatory Visit: Payer: Medicaid Other | Attending: Cardiovascular Disease

## 2023-07-20 DIAGNOSIS — I5022 Chronic systolic (congestive) heart failure: Secondary | ICD-10-CM

## 2023-07-20 DIAGNOSIS — I480 Paroxysmal atrial fibrillation: Secondary | ICD-10-CM

## 2023-07-20 DIAGNOSIS — Z7901 Long term (current) use of anticoagulants: Secondary | ICD-10-CM

## 2023-07-20 LAB — POCT INR: INR: 4.3 — AB (ref 2.0–3.0)

## 2023-07-20 NOTE — Patient Instructions (Signed)
HOLD TODAY and TOMORROW THEN Continue taking 1/2 tablet daily except 1 tablet on Monday, Wednesday, and Friday.  (Started Amio 200mg  daily).  Recheck INR in 2 week. Coumadin Clinic [phone]

## 2023-08-03 ENCOUNTER — Ambulatory Visit: Payer: Medicaid Other | Attending: Internal Medicine

## 2023-08-03 DIAGNOSIS — Z7901 Long term (current) use of anticoagulants: Secondary | ICD-10-CM | POA: Diagnosis not present

## 2023-08-03 DIAGNOSIS — I480 Paroxysmal atrial fibrillation: Secondary | ICD-10-CM | POA: Diagnosis not present

## 2023-08-03 DIAGNOSIS — I5022 Chronic systolic (congestive) heart failure: Secondary | ICD-10-CM | POA: Diagnosis not present

## 2023-08-03 LAB — POCT INR: INR: 3.3 — AB (ref 2.0–3.0)

## 2023-08-03 NOTE — Patient Instructions (Signed)
Description   Take 1/2 tablet today, then start taking 1/2 tablet daily except 1 tablet on Mondays and Fridays.  (Started Amio 200mg  daily).  Recheck INR in 2 weeks. Coumadin Clinic [phone]

## 2023-08-10 ENCOUNTER — Telehealth: Payer: Self-pay | Admitting: Cardiology

## 2023-08-10 ENCOUNTER — Encounter: Payer: Medicaid Other | Admitting: Internal Medicine

## 2023-08-10 NOTE — Telephone Encounter (Signed)
Remote transmission reviewed.  Normal Device Function. No shocks/therapies given, normal AS/BIVP at 80bpm.  No arrhythmias recorded.   Spoke with patient.  She is feeling fine at present but did wake up feeling a "zing or pinch" type sensation over her device area around 1am, wondered if she was shocked as this had never happened before.   I reviewed the transmission with her, patient feels reassured and knows to call in the future if any further concerns.

## 2023-08-10 NOTE — Telephone Encounter (Signed)
The pt states she got a pinched on her device site. I had her send a manual transmission with her home remote monitor. I let her speak with Mandy, rn.

## 2023-08-10 NOTE — Telephone Encounter (Signed)
  1. Has your device fired? Yes  2. Is you device beeping?   3. Are you experiencing draining or swelling at device site?   No  4. Are you calling to see if we received your device transmission?   No  5. Have you passed out?  Patient stated the "pinch" woke her up   Please route to Device Clinic Pool

## 2023-08-17 ENCOUNTER — Ambulatory Visit: Payer: Medicaid Other

## 2023-09-21 ENCOUNTER — Ambulatory Visit: Payer: Medicaid Other | Admitting: Internal Medicine

## 2023-09-24 ENCOUNTER — Other Ambulatory Visit: Payer: Self-pay

## 2023-09-24 DIAGNOSIS — Z941 Heart transplant status: Secondary | ICD-10-CM

## 2023-10-19 ENCOUNTER — Ambulatory Visit (HOSPITAL_COMMUNITY)
Admission: RE | Admit: 2023-10-19 | Discharge: 2023-10-19 | Disposition: A | Discharge status: Home / Self Care | Payer: Medicaid Other | Source: Ambulatory Visit | Attending: Internal Medicine | Admitting: Internal Medicine

## 2023-10-19 ENCOUNTER — Encounter (HOSPITAL_COMMUNITY): Payer: Self-pay | Admitting: Internal Medicine

## 2023-10-19 VITALS — BP 138/88 | HR 104 | Ht 67.0 in | Wt 194.4 lb

## 2023-10-19 DIAGNOSIS — Z87891 Personal history of nicotine dependence: Secondary | ICD-10-CM | POA: Insufficient documentation

## 2023-10-19 DIAGNOSIS — E1136 Type 2 diabetes mellitus with diabetic cataract: Secondary | ICD-10-CM | POA: Diagnosis not present

## 2023-10-19 DIAGNOSIS — I110 Hypertensive heart disease with heart failure: Secondary | ICD-10-CM | POA: Diagnosis not present

## 2023-10-19 DIAGNOSIS — D860 Sarcoidosis of lung: Secondary | ICD-10-CM | POA: Diagnosis not present

## 2023-10-19 DIAGNOSIS — I5022 Chronic systolic (congestive) heart failure: Secondary | ICD-10-CM | POA: Diagnosis not present

## 2023-10-19 DIAGNOSIS — E669 Obesity, unspecified: Secondary | ICD-10-CM | POA: Insufficient documentation

## 2023-10-19 DIAGNOSIS — I428 Other cardiomyopathies: Secondary | ICD-10-CM | POA: Diagnosis not present

## 2023-10-19 DIAGNOSIS — Z7985 Long-term (current) use of injectable non-insulin antidiabetic drugs: Secondary | ICD-10-CM | POA: Diagnosis not present

## 2023-10-19 DIAGNOSIS — I10 Essential (primary) hypertension: Secondary | ICD-10-CM

## 2023-10-19 DIAGNOSIS — D8689 Sarcoidosis of other sites: Secondary | ICD-10-CM | POA: Diagnosis not present

## 2023-10-19 DIAGNOSIS — Z95810 Presence of automatic (implantable) cardiac defibrillator: Secondary | ICD-10-CM | POA: Insufficient documentation

## 2023-10-19 DIAGNOSIS — Z941 Heart transplant status: Secondary | ICD-10-CM

## 2023-10-19 DIAGNOSIS — G4733 Obstructive sleep apnea (adult) (pediatric): Secondary | ICD-10-CM | POA: Insufficient documentation

## 2023-10-19 NOTE — Addendum Note (Signed)
Encounter addended by: Orlene Och, [place] on: 10/19/2023 10:54 AM  Actions taken: Clinical Note Signed

## 2023-10-19 NOTE — Progress Notes (Signed)
ADVANCED HF CLINIC NOTE  PCP: Kennith Maes, PA-C Primary Cardiologist: Dr. Jacinto Halim HF Cardiologist: Dr. Gala Romney   HPI: Alicia Cook is a 57 y.o. 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 She is s/p OHTx on 08/11/23   Developed HF in 2017. Echo at that time EF 25-30%, normal RV. LHC in 2017 and 2022 showed normal coronaries. S/p BiV ICD. Also w/ pulmonary sarcoid. No cardiac evolvement, had negative myocardial biopsy in 2017 and negative cardiac PET in 2022. Also h/o    S/p OHTx at [facility] in 10/24. Explant path + for sarcoid. Mild rejection (2R) in 11/24.   Feels great. Wanting to return to work. Denies CP, SOB or edema. Struggling with blood sugar control. Compliant with meds. Has been on midodrine to keep BP up.    Cardiac Studies - cMRI (6/24): LVEF 10-15% with severe global HK, RV moderately-severely down, mild to moderate MR and AI, no evidence of sarcoidosis.  - Echo (6/24 at [facility]): EF < 15%  - R/LHC (6/24): normal cors, severe NICM EF < 10% RA 6, PA 32/14 (22), PCWP 11, CO/CI (Fick) 4.8/2.4, PVR 2.2 WU, PAPi 3   - Echo (6/24): EF < 10%, LV massively dilated,  G2 DD, mild MR.  - LHC in 2017 and 2022: normal cors  - Echo (2017):  EF 25-30%, normal RV.  ROS: All systems negative except as listed in HPI, PMH and Problem List.  SH:  Social History   Socioeconomic History   Marital status: Single    Spouse name: Not on file   Number of children: 5   Years of education: Not on file   Highest education level: Not on file  Occupational History   Not on file  Tobacco Use   Smoking status: Former    Current packs/day: 0.00    Average packs/day: 1 pack/day for 20.0 years (20.0 ttl pk-yrs)    Types: Cigarettes    Start date: 56    Quit date: 2016    Years since quitting: 8.9   Smokeless tobacco: Never  Vaping Use   Vaping status: Never Used  Substance and Sexual Activity    Alcohol use: No    Alcohol/week: 0.0 standard drinks of alcohol   Drug use: No   Sexual activity: Not on file  Other Topics Concern   Not on file  Social History Narrative   Not on file   Social Drivers of Health   Financial Resource Strain: High Risk (05/07/2023)   Overall Financial Resource Strain (CARDIA)    Difficulty of Paying Living Expenses: Hard  Food Insecurity: No Food Insecurity (04/23/2023)   Received from [facility], [facility]   Hunger Vital Sign    Worried About Running Out of Food in the Last Year: Never true    Ran Out of Food in the Last Year: Never true  Transportation Needs: No Transportation Needs (04/24/2023)   Received from [facility], [facility]   [facility] - Transportation    In the past 12 months, has lack of transportation kept you from medical appointments or from getting medications?: No    Lack of Transportation (Non-Medical): No  Physical Activity: Not on file  Stress: Not on file  Social Connections: Unknown (05/08/2023)   Received from [facility], [facility]   Social Network    Social Network: Not on file  Intimate Partner  Violence: Unknown (05/08/2023)   Received from [facility], [facility]   HITS    Physically Hurt: Not on file    Insult or Talk Down To: Not on file    Threaten Physical Harm: Not on file    Scream or Curse: Not on file   FH:  Family History  Problem Relation Age of Onset   Heart attack Father        a. Initial onset in his 16's. S/p CABG   Heart failure Father    Past Medical History:  Diagnosis Date   Cataract    CHF (congestive heart failure) (HCC)    Depression    Diabetes mellitus without complication (HCC)    Encounter for adjustment of biventricular implantable cardioverter-defibrillator (ICD) 06/13/2019   GERD (gastroesophageal reflux disease)    Hypertension    ICD: Cardiac defibrillator in situ 01/13/2015   [employer]  Bi-V ICD (Dynagen X4 CRT-D) 01/13/2015 at [facility] - MRI Compatible  Scheduled Remote ICD check 4.22.20: 1 SVT/8 secs, normal function. Battery longevity is 8 years. RA pacing is 1 %, RV pacing is 99 %, and LV pacing is 99 %.   Nonischemic cardiomyopathy (HCC)    a. EF 35-40% by cath in 2011 with normal cors b. s/p ICD implantation in 12/2014 c. EF 25% by NST in 02/01/2016   Sarcoidosis    Sarcoidosis 02/08/2016   Current Outpatient Medications  Medication Sig Dispense Refill   aspirin EC 81 MG tablet Take 81 mg by mouth daily. Swallow whole.     magnesium hydroxide (MILK OF MAGNESIA) 400 MG/5ML suspension Take 30 mLs by mouth daily as needed for mild constipation.     magnesium oxide (MAG-OX) 400 MG tablet Take by mouth.     midodrine (PROAMATINE) 5 MG tablet TAKE 2 TABS BY MOUTH 3 TIMES DAILY WITH MEALS     Multiple Vitamin (MULTIVITAMIN WITH MINERALS) TABS tablet Take 1 tablet by mouth daily with lunch.     mycophenolate (CELLCEPT) 250 MG capsule TAKE 6 CAPSULES BY MOUTH EVERY 12 HOURS     pantoprazole (PROTONIX) 40 MG tablet Take 40 mg by mouth daily as needed (acid reflux).     Potassium Chloride ER 20 MEQ TBCR Take 1 tablet (20 mEq total) by mouth daily. TAKE 2 TABLETS ( ) ON THE DAYS YOU TAKE TORSEMIDE 60. TAKE 1 TABLET ( ) ON THE DAYS YOU TAKE TORSEMIDE 40MG  30 tablet 6   predniSONE (DELTASONE) 5 MG tablet Take 15 mg by mouth daily.     rosuvastatin (CRESTOR) 10 MG tablet Take 10 mg by mouth daily.     senna-docusate (SENOKOT-S) 8.6-50 MG tablet Take by mouth.     sertraline (ZOLOFT) 100 MG tablet Take 150 mg by mouth daily.     sulfamethoxazole-trimethoprim (BACTRIM) 400-80 MG tablet Take 1 tablet by mouth daily.     tacrolimus (PROGRAF) 1 MG capsule Take by mouth. Take 5 capsules by mouth every morning and 4 capsule at bedtime     tirzepatide (MOUNJARO) 12.5 MG/0.5ML Pen Inject 12.5 mg into the skin once a week.     torsemide (DEMADEX) 20 MG tablet Take 1 tablet (20  mg total) by mouth daily. TAKE 2 TABLETS (40MG ) ONE DAY ALTERNATING WITH  (20MG ) 1 TABLET DAILY 30 tablet 6   valGANciclovir (VALCYTE) 450 MG tablet Take 450 mg by mouth daily. Take 2 tablets by mouth at dinner     No current facility-administered medications for this encounter.   Vitals:  10/19/23 1006  BP: 138/88  Pulse: (!) 104  SpO2: 100%     Wt Readings from Last 3 Encounters:  10/19/23 88.2 kg (194 lb 6.4 oz)  06/19/23 86.7 kg (191 lb 3.2 oz)  05/29/23 89 kg (196 lb 3.2 oz)   PHYSICAL EXAM: General:  Well appearing. No resp difficulty HEENT: normal Neck: supple. no JVD. Carotids 2+ bilat; no bruits. No lymphadenopathy or thryomegaly appreciated. Cor: PMI nondisplaced. Regular tachy  Lungs: clear Abdomen: soft, nontender, nondistended. No hepatosplenomegaly. No bruits or masses. Good bowel sounds. Extremities: no cyanosis, clubbing, rash, edema Neuro: alert & orientedx3, cranial nerves grossly intact. moves all 4 extremities w/o difficulty. Affect pleasant   ASSESSMENT & PLAN:  1.Chronic Systolic Heart Failure/ Biventricular Dysfunction s/p OHTx at [facility] 10/24  - NICM since 2017. LHCs 2017 and 2022 showed no CAD - Cardiac PET (-) for sarcoid. EXplant + for sarcoid - s/p OHTx 10/24 at [facility]  - Doing well NYHA I  - Tomorrow is her Bday!!  2. Pulmonary Sarcoid  - No cardiac involvement, negative myocardial biopsy in 2017 and negative cardiac PET 2022  - Explant + for sarcoid -> continue immunosuppressant   3. Type 2DM  - Hgb A1c 6.0 (6/24) - Now using dexcom    4 HTN - BP has been low. Now improved on midodrine - Wean midodrine as tolerated    Arvilla Meres, MD  10:35 AM

## 2023-10-19 NOTE — Patient Instructions (Signed)
  Medication Changes:  NO CHANGES   Lab Work:  NONE    Follow-Up in: 12 MONTHS GIVE OUR OFFICE A CALL AROUND OCTOBER TO SCHEDULE.    At the [facility], you and your health needs are our priority. We have a designated team specialized in the treatment of Heart Failure. This Care Team includes your primary Heart Failure Specialized Cardiologist (physician), Advanced Practice Providers (APPs- Physician Assistants and Nurse Practitioners), and Pharmacist who all work together to provide you with the care you need, when you need it.   You may see any of the following providers on your designated Care Team at your next follow up:  Dr. Arvilla Meres Dr. Marca Ancona Dr. Dorthula Nettles Dr. Theresia Bough Tonye Becket, NP Robbie Lis, [place] [facility] [place], [place] Brynda Peon, NP [place] Lee, NP Karle Plumber, PharmD   Please be sure to bring in all your medications bottles to every appointment.   Need to Contact us:  If you have any questions or concerns before your next appointment please send [place] a message through [employer] or call our office at [phone].    TO LEAVE A MESSAGE FOR THE NURSE SELECT OPTION 2, PLEASE LEAVE A MESSAGE INCLUDING: YOUR NAME DATE OF BIRTH CALL BACK NUMBER REASON FOR CALL**this is important as we prioritize the call backs  YOU WILL RECEIVE A CALL BACK THE SAME DAY AS LONG AS YOU CALL BEFORE 4:00 PM

## 2023-12-15 ENCOUNTER — Emergency Department (HOSPITAL_BASED_OUTPATIENT_CLINIC_OR_DEPARTMENT_OTHER): Payer: Medicaid Other

## 2023-12-15 ENCOUNTER — Other Ambulatory Visit: Payer: Self-pay

## 2023-12-15 ENCOUNTER — Emergency Department (HOSPITAL_BASED_OUTPATIENT_CLINIC_OR_DEPARTMENT_OTHER)
Admission: EM | Admit: 2023-12-15 | Discharge: 2023-12-15 | Disposition: A | Discharge status: Home / Self Care | Payer: Medicaid Other | Attending: Emergency Medicine | Admitting: Emergency Medicine

## 2023-12-15 ENCOUNTER — Encounter (HOSPITAL_BASED_OUTPATIENT_CLINIC_OR_DEPARTMENT_OTHER): Payer: Self-pay | Admitting: Emergency Medicine

## 2023-12-15 DIAGNOSIS — I110 Hypertensive heart disease with heart failure: Secondary | ICD-10-CM | POA: Insufficient documentation

## 2023-12-15 DIAGNOSIS — Z794 Long term (current) use of insulin: Secondary | ICD-10-CM | POA: Diagnosis not present

## 2023-12-15 DIAGNOSIS — I5022 Chronic systolic (congestive) heart failure: Secondary | ICD-10-CM | POA: Insufficient documentation

## 2023-12-15 DIAGNOSIS — R000 Tachycardia, unspecified: Secondary | ICD-10-CM | POA: Insufficient documentation

## 2023-12-15 DIAGNOSIS — E876 Hypokalemia: Secondary | ICD-10-CM | POA: Insufficient documentation

## 2023-12-15 DIAGNOSIS — R059 Cough, unspecified: Secondary | ICD-10-CM | POA: Diagnosis present

## 2023-12-15 DIAGNOSIS — E119 Type 2 diabetes mellitus without complications: Secondary | ICD-10-CM | POA: Insufficient documentation

## 2023-12-15 DIAGNOSIS — Z7952 Long term (current) use of systemic steroids: Secondary | ICD-10-CM | POA: Insufficient documentation

## 2023-12-15 DIAGNOSIS — Z941 Heart transplant status: Secondary | ICD-10-CM | POA: Diagnosis not present

## 2023-12-15 DIAGNOSIS — Z79899 Other long term (current) drug therapy: Secondary | ICD-10-CM | POA: Insufficient documentation

## 2023-12-15 DIAGNOSIS — J101 Influenza due to other identified influenza virus with other respiratory manifestations: Secondary | ICD-10-CM | POA: Insufficient documentation

## 2023-12-15 DIAGNOSIS — Z7951 Long term (current) use of inhaled steroids: Secondary | ICD-10-CM | POA: Diagnosis not present

## 2023-12-15 DIAGNOSIS — Z7982 Long term (current) use of aspirin: Secondary | ICD-10-CM | POA: Diagnosis not present

## 2023-12-15 DIAGNOSIS — J111 Influenza due to unidentified influenza virus with other respiratory manifestations: Secondary | ICD-10-CM

## 2023-12-15 LAB — URINALYSIS, ROUTINE W REFLEX MICROSCOPIC
Bilirubin Urine: NEGATIVE
Glucose, UA: NEGATIVE mg/dL
Hgb urine dipstick: NEGATIVE
Ketones, ur: NEGATIVE mg/dL
Leukocytes,Ua: NEGATIVE
Nitrite: NEGATIVE
Protein, ur: 100 mg/dL — AB
Specific Gravity, Urine: 1.025 (ref 1.005–1.030)
pH: 7 (ref 5.0–8.0)

## 2023-12-15 LAB — CBC WITH DIFFERENTIAL/PLATELET
Abs Immature Granulocytes: 0.16 10*3/uL — ABNORMAL HIGH (ref 0.00–0.07)
Basophils Absolute: 0 10*3/uL (ref 0.0–0.1)
Basophils Relative: 0 %
Eosinophils Absolute: 0 10*3/uL (ref 0.0–0.5)
Eosinophils Relative: 0 %
HCT: 34.5 % — ABNORMAL LOW (ref 36.0–46.0)
Hemoglobin: 11.3 g/dL — ABNORMAL LOW (ref 12.0–15.0)
Immature Granulocytes: 2 %
Lymphocytes Relative: 5 %
Lymphs Abs: 0.4 10*3/uL — ABNORMAL LOW (ref 0.7–4.0)
MCH: 27.9 pg (ref 26.0–34.0)
MCHC: 32.8 g/dL (ref 30.0–36.0)
MCV: 85.2 fL (ref 80.0–100.0)
Monocytes Absolute: 0.4 10*3/uL (ref 0.1–1.0)
Monocytes Relative: 5 %
Neutro Abs: 6 10*3/uL (ref 1.7–7.7)
Neutrophils Relative %: 88 %
Platelets: 240 10*3/uL (ref 150–400)
RBC: 4.05 MIL/uL (ref 3.87–5.11)
RDW: 12.6 % (ref 11.5–15.5)
WBC: 6.9 10*3/uL (ref 4.0–10.5)
nRBC: 0 % (ref 0.0–0.2)

## 2023-12-15 LAB — COMPREHENSIVE METABOLIC PANEL
ALT: 16 U/L (ref 0–44)
AST: 23 U/L (ref 15–41)
Albumin: 3.6 g/dL (ref 3.5–5.0)
Alkaline Phosphatase: 63 U/L (ref 38–126)
Anion gap: 10 (ref 5–15)
BUN: 21 mg/dL — ABNORMAL HIGH (ref 6–20)
CO2: 24 mmol/L (ref 22–32)
Calcium: 9.1 mg/dL (ref 8.9–10.3)
Chloride: 103 mmol/L (ref 98–111)
Creatinine, Ser: 1.06 mg/dL — ABNORMAL HIGH (ref 0.44–1.00)
GFR, Estimated: >60 mL/min (ref >60)
Glucose, Bld: 101 mg/dL — ABNORMAL HIGH (ref 70–99)
Potassium: 3 mmol/L — ABNORMAL LOW (ref 3.5–5.1)
Sodium: 137 mmol/L (ref 135–145)
Total Bilirubin: 0.4 mg/dL (ref 0.0–1.2)
Total Protein: 6.6 g/dL (ref 6.5–8.1)

## 2023-12-15 LAB — RESP PANEL BY RT-PCR (RSV, FLU A&B, COVID)  RVPGX2
Influenza A by PCR: POSITIVE — AB
Influenza B by PCR: NEGATIVE
Resp Syncytial Virus by PCR: NEGATIVE
SARS Coronavirus 2 by RT PCR: NEGATIVE

## 2023-12-15 LAB — URINALYSIS, MICROSCOPIC (REFLEX)

## 2023-12-15 LAB — CBG MONITORING, ED: Glucose-Capillary: 97 mg/dL (ref 70–99)

## 2023-12-15 LAB — TROPONIN I (HIGH SENSITIVITY)
Troponin I (High Sensitivity): 32 ng/L — ABNORMAL HIGH (ref <18)
Troponin I (High Sensitivity): 29 ng/L — ABNORMAL HIGH (ref <18)

## 2023-12-15 LAB — LACTIC ACID, PLASMA: Lactic Acid, Venous: 1.1 mmol/L (ref 0.5–1.9)

## 2023-12-15 LAB — BRAIN NATRIURETIC PEPTIDE: B Natriuretic Peptide: 331.1 pg/mL — ABNORMAL HIGH (ref 0.0–100.0)

## 2023-12-15 MED ORDER — KETOROLAC TROMETHAMINE 30 MG/ML IJ SOLN
15.0000 mg | Freq: Once | INTRAMUSCULAR | Status: AC
  Administered 2023-12-15: 15 mg via INTRAVENOUS
  Filled 2023-12-15: qty 1

## 2023-12-15 MED ORDER — PREDNISONE 10 MG PO TABS
10.0000 mg | ORAL_TABLET | Freq: Once | ORAL | Status: AC
  Administered 2023-12-15: 10 mg via ORAL
  Filled 2023-12-15: qty 1

## 2023-12-15 MED ORDER — METOCLOPRAMIDE HCL 5 MG/ML IJ SOLN
10.0000 mg | Freq: Once | INTRAMUSCULAR | Status: AC
  Administered 2023-12-15: 10 mg via INTRAVENOUS
  Filled 2023-12-15: qty 2

## 2023-12-15 MED ORDER — POTASSIUM CHLORIDE CRYS ER 20 MEQ PO TBCR
40.0000 meq | EXTENDED_RELEASE_TABLET | Freq: Once | ORAL | Status: AC
  Administered 2023-12-15: 40 meq via ORAL
  Filled 2023-12-15: qty 2

## 2023-12-15 MED ORDER — OSELTAMIVIR PHOSPHATE 75 MG PO CAPS
75.0000 mg | ORAL_CAPSULE | Freq: Once | ORAL | Status: AC
  Administered 2023-12-15: 75 mg via ORAL
  Filled 2023-12-15: qty 1

## 2023-12-15 MED ORDER — ACETAMINOPHEN 325 MG PO TABS
650.0000 mg | ORAL_TABLET | Freq: Once | ORAL | Status: AC
  Administered 2023-12-15: 650 mg via ORAL
  Filled 2023-12-15: qty 2

## 2023-12-15 MED ORDER — DIPHENHYDRAMINE HCL 50 MG/ML IJ SOLN
25.0000 mg | Freq: Once | INTRAMUSCULAR | Status: AC
  Administered 2023-12-15: 25 mg via INTRAVENOUS
  Filled 2023-12-15: qty 1

## 2023-12-15 MED ORDER — SODIUM CHLORIDE 0.9 % IV BOLUS
500.0000 mL | Freq: Once | INTRAVENOUS | Status: AC
  Administered 2023-12-15: 500 mL via INTRAVENOUS

## 2023-12-15 MED ORDER — TACROLIMUS ER 4 MG PO TB24
7.0000 mg | ORAL_TABLET | Freq: Every day | ORAL | Status: DC
  Administered 2023-12-15: 7 mg via ORAL
  Filled 2023-12-15: qty 3

## 2023-12-15 MED ORDER — SODIUM CHLORIDE 0.9 % IV BOLUS
1000.0000 mL | Freq: Once | INTRAVENOUS | Status: AC
  Administered 2023-12-15: 1000 mL via INTRAVENOUS

## 2023-12-15 MED ORDER — SODIUM CHLORIDE 0.9 % IV SOLN
1.0000 g | Freq: Once | INTRAVENOUS | Status: AC
  Administered 2023-12-15: 1 g via INTRAVENOUS
  Filled 2023-12-15: qty 10

## 2023-12-15 MED ORDER — SODIUM CHLORIDE 0.9 % IV SOLN
500.0000 mg | Freq: Once | INTRAVENOUS | Status: AC
  Administered 2023-12-15: 500 mg via INTRAVENOUS
  Filled 2023-12-15: qty 5

## 2023-12-15 NOTE — ED Notes (Signed)
[facility] in to transfer pt to [facility] at this time

## 2023-12-15 NOTE — ED Provider Notes (Signed)
7:02 AM Patient signed out to me by previous ED physician. Pt is a 58 yo female with pmh of OHTx on 08/11/23 presenting for viral syndrome. Influenza positive in ED. Tachypneic and tachycardic on arrival. No hypoxic.  Tamiflu given IVF 1.5 L Toradol for myalgias Reglan and benadryl for n/v  Call out to [facility] for further recs. Plan to admit pt with [facility] or transfer to [facility].      Physical Exam  BP 118/86   Pulse (!) 117   Temp 99.6 F (37.6 C) (Oral)   Resp (!) 33   Ht 5\' 7"  (1.702 m)   Wt 88 kg   LMP 09/14/1991 (Within Weeks)   SpO2 99%   BMI 30.38 kg/m   Physical Exam  Procedures  Procedures  ED Course / MDM    Medical Decision Making Amount and/or Complexity of Data Reviewed Labs: ordered. Radiology: ordered. ECG/medicine tests: ordered.  Risk OTC drugs. Prescription drug management.   Callback from [facility] cardiology team.  They will be accepting the patient for continued tachycardia and tachypnea secondary to influenza postop 5 months from cardiac transplant.  Accepting physician Dr. Leitha Bleak.  I also received a call from [facility] transplant team recommending to hold microphenylate. Get tacrolymus level and give mornign dose of Envarsis ER 7mg  and Prednisone 10 mg.        Franne Forts, DO 12/15/23 [phone]

## 2023-12-15 NOTE — ED Provider Notes (Addendum)
[place] [facility] Provider Note   CSN: [id number] Arrival date & time: 12/15/23  0246     History  Chief Complaint  Patient presents with   URI    Alicia Cook is a 58 y.o. female.   58 y.o. 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 She is s/p OHTx on 08/11/23.  She presents to the ED tonight with cough, congestion, body aches, difficulty breathing, headache.  Symptoms of ongoing for the past 4 days.  She developed fever tonight of 100.8.  Has a diffuse headache that is gradual in onset.  Denies thunderclap onset.  She feels achy all over with shortness of breath.  Denies chest pain.  Having some cough but not able to cough up anything.  No abdominal pain, vomiting or diarrhea.  She was seen in urgent care several days ago and placed on Augmentin for "upper respiratory infection.  She is been on this for the past 2 days and feels like she is not getting better.  She was seen by the transplant team at [facility] on February 12 and told everything was going well.  They did not give her an explantion for her body aches and headache at the time.  She takes aspirin but no other blood thinners.  Comes in tonight because she is achy all over and has a bad headache and feels like she is not getting better.  The history is provided by the patient.  URI Presenting symptoms: cough, fatigue, fever and rhinorrhea   Presenting symptoms: no congestion and no sore throat   Associated symptoms: arthralgias, headaches and myalgias        Home Medications Prior to Admission medications   Medication Sig Start Date End Date Taking? Authorizing Provider  aspirin EC 81 MG tablet Take 81 mg by mouth daily. Swallow whole.    [provider]  magnesium hydroxide (MILK OF MAGNESIA) 400 MG/5ML suspension Take 30 mLs by mouth daily as needed for mild constipation.    [provider]  magnesium oxide (MAG-OX) 400 MG tablet Take by mouth. 01/20/23   [provider]  midodrine (PROAMATINE) 5 MG tablet TAKE 2 TABS BY MOUTH 3 TIMES DAILY WITH MEALS 08/30/23   [provider]  Multiple Vitamin (MULTIVITAMIN WITH MINERALS) TABS tablet Take 1 tablet by mouth daily with lunch.    [provider]  mycophenolate (CELLCEPT) 250 MG capsule TAKE 6 CAPSULES BY MOUTH EVERY 12 HOURS 08/30/23   [provider]  pantoprazole (PROTONIX) 40 MG tablet Take 40 mg by mouth daily as needed (acid reflux). 01/23/20   [provider]  Potassium Chloride ER 20 MEQ TBCR Take 1 tablet (20 mEq total) by mouth daily. TAKE 2 TABLETS ( ) ON THE DAYS YOU TAKE TORSEMIDE 60. TAKE 1 TABLET ( ) ON THE DAYS YOU TAKE TORSEMIDE 40MG  05/29/23   Jacklynn Ganong, FNP  predniSONE (DELTASONE) 5 MG tablet Take 15 mg by mouth daily.    [provider]  rosuvastatin (CRESTOR) 10 MG tablet Take 10 mg by mouth daily.    [provider]  senna-docusate (SENOKOT-S) 8.6-50 MG tablet Take by mouth. 08/27/23   [provider]  sertraline (ZOLOFT) 100 MG tablet Take 150 mg by mouth daily.    [provider]  sulfamethoxazole-trimethoprim (BACTRIM) 400-80 MG tablet Take 1 tablet by mouth daily.    [provider]  tacrolimus (  PROGRAF) 1 MG capsule Take by mouth. Take 5 capsules by mouth every morning and 4 capsule at bedtime 10/05/23 10/04/24  [provider]  tirzepatide Greggory Keen) 12.5 MG/0.5ML Pen Inject 12.5 mg into the skin once a week.    [provider]  torsemide (DEMADEX) 20 MG tablet Take 1 tablet (20 mg total) by mouth daily. TAKE 2 TABLETS (40MG ) ONE DAY ALTERNATING WITH  (20MG ) 1 TABLET DAILY 05/29/23   Jacklynn Ganong, FNP      Allergies    Patient has no known allergies.    Review of Systems   Review of Systems  Constitutional:  Positive for chills, fatigue and fever.  HENT:  Positive  for rhinorrhea. Negative for congestion and sore throat.   Respiratory:  Positive for cough.   Cardiovascular:  Negative for chest pain.  Gastrointestinal:  Negative for abdominal pain, nausea and vomiting.  Genitourinary:  Negative for dysuria.  Musculoskeletal:  Positive for arthralgias and myalgias.  Skin:  Negative for rash.  Neurological:  Positive for weakness and headaches.   all other systems are negative except as noted in the HPI and PMH.    Physical Exam Updated Vital Signs BP 118/86   Pulse (!) 117   Temp 99.6 F (37.6 C) (Oral)   Resp (!) 33   Ht 5\' 7"  (1.702 m)   Wt 88 kg   LMP 09/14/1991 (Within Weeks)   SpO2 99%   BMI 30.38 kg/m  Physical Exam Vitals and nursing note reviewed.  Constitutional:      General: She is not in acute distress.    Appearance: She is well-developed.  HENT:     Head: Normocephalic and atraumatic.     Nose: No rhinorrhea.     Mouth/Throat:     Pharynx: No oropharyngeal exudate.  Eyes:     Conjunctiva/sclera: Conjunctivae normal.     Pupils: Pupils are equal, round, and reactive to light.  Neck:     Comments: No meningismus. Cardiovascular:     Rate and Rhythm: Regular rhythm. Tachycardia present.     Heart sounds: Normal heart sounds. No murmur heard.    Comments: Tachycardic 120s Pulmonary:     Effort: Pulmonary effort is normal. No respiratory distress.     Breath sounds: Normal breath sounds. No wheezing.  Abdominal:     Palpations: Abdomen is soft.     Tenderness: There is no abdominal tenderness. There is no guarding or rebound.  Musculoskeletal:        General: No tenderness. Normal range of motion.     Cervical back: Normal range of motion and neck supple.  Skin:    General: Skin is warm.  Neurological:     Mental Status: She is alert and oriented to person, place, and time.     Cranial Nerves: No cranial nerve deficit.     Motor: No abnormal muscle tone.     Coordination: Coordination normal.     Comments:  5/5  strength throughout. CN 2-12 intact.Equal grip strength.   Psychiatric:        Behavior: Behavior normal.     ED Results / Procedures / Treatments   Labs (all labs ordered are listed, but only abnormal results are displayed) Labs Reviewed  RESP PANEL BY RT-PCR (RSV, FLU A&B, COVID)  RVPGX2 - Abnormal; Notable for the following components:      Result Value   Influenza A by PCR POSITIVE (*)    All other components within normal limits  CBC WITH DIFFERENTIAL/PLATELET - Abnormal; Notable for the following components:   Hemoglobin 11.3 (*)    HCT 34.5 (*)    Lymphs Abs 0.4 (*)    Abs Immature Granulocytes 0.16 (*)    All other components within normal limits  COMPREHENSIVE METABOLIC PANEL - Abnormal; Notable for the following components:   Potassium 3.0 (*)    Glucose, Bld 101 (*)    BUN 21 (*)    Creatinine, Ser 1.06 (*)    All other components within normal limits  BRAIN NATRIURETIC PEPTIDE - Abnormal; Notable for the following components:   B Natriuretic Peptide 331.1 (*)    All other components within normal limits  URINALYSIS, ROUTINE W REFLEX MICROSCOPIC - Abnormal; Notable for the following components:   Protein, ur 100 (*)    All other components within normal limits  URINALYSIS, MICROSCOPIC (REFLEX) - Abnormal; Notable for the following components:   Bacteria, UA RARE (*)    All other components within normal limits  TROPONIN I (HIGH SENSITIVITY) - Abnormal; Notable for the following components:   Troponin I (High Sensitivity) 32 (*)    All other components within normal limits  TROPONIN I (HIGH SENSITIVITY) - Abnormal; Notable for the following components:   Troponin I (High Sensitivity) 29 (*)    All other components within normal limits  CULTURE, BLOOD (ROUTINE X 2)  CULTURE, BLOOD (ROUTINE X 2)  LACTIC ACID, PLASMA    EKG EKG Interpretation Date/Time:  Saturday December 15 2023 03:08:50 EST Ventricular Rate:  125 PR Interval:  182 QRS Duration:  116 QT  Interval:  343 QTC Calculation: 495 R Axis:   76  Text Interpretation: Sinus tachycardia Probable left atrial enlargement Nonspecific intraventricular conduction delay Low voltage, precordial leads Rate faster no longer paced Confirmed by Glynn Octave [phone]) on 12/15/2023 3:13:24 AM  Radiology DG Chest 2 View Result Date: 12/15/2023 CLINICAL DATA:  58 year old female with history of cough, fever and shortness of breath. EXAM: CHEST - 2 VIEW COMPARISON:  Chest x-ray 04/11/2023. FINDINGS: Lung volumes are normal. No consolidative airspace disease. No pleural effusions. No pneumothorax. No pulmonary nodule or mass noted. Pulmonary vasculature and the cardiomediastinal silhouette are within normal limits. Status post median sternotomy. Previously noted biventricular pacemaker/AICD has been removed. IMPRESSION: No active cardiopulmonary disease. Electronically Signed   By: Trudie Reed M.D.   On: 12/15/2023 04:59    Procedures .Critical Care  Performed by: Glynn Octave, MD Authorized by: Glynn Octave, MD   Critical care provider statement:    Critical care time (minutes):  45   Critical care time was exclusive of:  Separately billable procedures and treating other patients   Critical care was necessary to treat or prevent imminent or life-threatening deterioration of the following conditions:  Sepsis   Critical care was time spent personally by me on the following activities:  Development of treatment plan with patient or surrogate, discussions with consultants, evaluation of patient's response to treatment, examination of patient, ordering and review of laboratory studies, ordering and review of radiographic studies, ordering and performing treatments and interventions, pulse oximetry, re-evaluation of patient's condition, review of old charts, blood draw for specimens and obtaining history from patient or surrogate   I assumed direction of critical care for this patient from another  provider in my specialty: no     Care discussed with: admitting provider       Medications Ordered in ED Medications - No data to display  ED Course/ Medical Decision Making/  A&P                                 Medical Decision Making Amount and/or Complexity of Data Reviewed Labs: ordered. Decision-making details documented in ED Course. Radiology: ordered and independent interpretation performed. Decision-making details documented in ED Course. ECG/medicine tests: ordered and independent interpretation performed. Decision-making details documented in ED Course.  Risk OTC drugs. Prescription drug management.   Immnuo compromised patient with history of heart transplant October 2024 here with 4 days of URI symptoms with cough, congestion, headache, body aches, chills and fever to 100.8.  Afebrile on arrival but tachycardic.  EKG sinus tachycardia without acute ST changes.  No EKGs in system after heart transplant.  She has sinus tachycardia in the 120s with nonspecific ST changes and further and laterally.  Infectious workup pursued.  Will obtain labs including blood cultures, chest x-ray, urinalysis, COVID swab.  Patient given IV fluids.  Her ejection fraction was normal per last echocardiogram.  Influenza swab is positive.  Labs show mild hypokalemia.  Troponin elevated at 32.  Creatinine is at baseline.  Chest x-ray is negative for pneumonia or infiltrate.  Results reviewed and interpreted by me.  Patient remains tachypneic and tachycardic but not hypoxic.  Blood pressure is stable.  Call placed to [facility] transfer line.  Overnight cardiology fellow has been quite busy and unable to take a phone call.  Will not provide recommendations over the phone.  Feel patient needs to be admitted at [facility] versus [facility].  Transfer line anticipates transplant team will call back after 7 AM to give recommendations for admission to [facility].  Patient remained stable but  tachypneic and tachycardic. Repeat troponin is slightly low concern for ACS  Patient remained stable but tachypneic and tachycardic.  Awaiting return call from [facility] to discuss transfer versus admission to [facility].  Patient given broad-spectrum antibiotics given immunocompromise state though favor her illness likely secondary influenza.  Also started on Tamiflu.  Care to be transferred at shift change pending callback from [facility].  Dr. Wallace Cullens to assume care.        Final Clinical Impression(s) / ED Diagnoses Final diagnoses:  Influenza  History of heart transplant [facility])    Rx / DC Orders ED Discharge Orders     None         Chelsie Burel, Jeannett Senior, MD 12/15/23 0700    Glynn Octave, MD 12/15/23 2008

## 2023-12-15 NOTE — ED Notes (Signed)
Attempted to obtain 2nd IV site unable to thread catheter

## 2023-12-15 NOTE — ED Notes (Signed)
Pt is aware of pending transport to [facility] and consents

## 2023-12-15 NOTE — ED Notes (Addendum)
Pt HR noted to suddenly increase to 155-160. Reports able to feel pressure in her chest with tachycardia. EDP notified. EKG obtained. No change in EKG other than Increase rate. No new orders

## 2023-12-15 NOTE — ED Notes (Signed)
Report given Christy with [facility]

## 2023-12-15 NOTE — ED Notes (Signed)
Called [facility], Cariology has been re-paged.

## 2023-12-15 NOTE — ED Triage Notes (Signed)
URI with headache X 4-5 days.

## 2023-12-18 LAB — TACROLIMUS LEVEL: Tacrolimus (FK506) - LabCorp: 14.6 ng/mL (ref 5.0–20.0)

## 2023-12-20 LAB — CULTURE, BLOOD (ROUTINE X 2)
Culture: NO GROWTH
Culture: NO GROWTH
Special Requests: ADEQUATE

## 2024-05-31 ENCOUNTER — Other Ambulatory Visit: Payer: Self-pay | Admitting: Cardiology

## 2024-05-31 DIAGNOSIS — I5022 Chronic systolic (congestive) heart failure: Secondary | ICD-10-CM

## 2024-07-05 ENCOUNTER — Emergency Department (HOSPITAL_BASED_OUTPATIENT_CLINIC_OR_DEPARTMENT_OTHER): Admission: EM | Admit: 2024-07-05 | Discharge: 2024-07-05 | Disposition: A | Discharge status: Home / Self Care

## 2024-07-05 ENCOUNTER — Encounter (HOSPITAL_BASED_OUTPATIENT_CLINIC_OR_DEPARTMENT_OTHER): Payer: Self-pay

## 2024-07-05 ENCOUNTER — Other Ambulatory Visit: Payer: Self-pay

## 2024-07-05 ENCOUNTER — Emergency Department (HOSPITAL_BASED_OUTPATIENT_CLINIC_OR_DEPARTMENT_OTHER)

## 2024-07-05 DIAGNOSIS — Z7982 Long term (current) use of aspirin: Secondary | ICD-10-CM | POA: Diagnosis not present

## 2024-07-05 DIAGNOSIS — R42 Dizziness and giddiness: Secondary | ICD-10-CM | POA: Diagnosis present

## 2024-07-05 DIAGNOSIS — I509 Heart failure, unspecified: Secondary | ICD-10-CM | POA: Insufficient documentation

## 2024-07-05 DIAGNOSIS — R000 Tachycardia, unspecified: Secondary | ICD-10-CM | POA: Insufficient documentation

## 2024-07-05 DIAGNOSIS — N189 Chronic kidney disease, unspecified: Secondary | ICD-10-CM | POA: Diagnosis not present

## 2024-07-05 LAB — URINALYSIS, ROUTINE W REFLEX MICROSCOPIC
Glucose, UA: NEGATIVE mg/dL
Hgb urine dipstick: NEGATIVE
Ketones, ur: NEGATIVE mg/dL
Leukocytes,Ua: NEGATIVE
Nitrite: NEGATIVE
Protein, ur: 100 mg/dL — AB
Specific Gravity, Urine: >=1.03 (ref 1.005–1.030)
pH: 5.5 (ref 5.0–8.0)

## 2024-07-05 LAB — CBC
HCT: 40.2 % (ref 36.0–46.0)
Hemoglobin: 12.9 g/dL (ref 12.0–15.0)
MCH: 27.8 pg (ref 26.0–34.0)
MCHC: 32.1 g/dL (ref 30.0–36.0)
MCV: 86.6 fL (ref 80.0–100.0)
Platelets: 242 K/uL (ref 150–400)
RBC: 4.64 MIL/uL (ref 3.87–5.11)
RDW: 13.7 % (ref 11.5–15.5)
WBC: 6.9 K/uL (ref 4.0–10.5)
nRBC: 0 % (ref 0.0–0.2)

## 2024-07-05 LAB — TROPONIN T, HIGH SENSITIVITY: Troponin T High Sensitivity: 19 ng/L (ref 0–19)

## 2024-07-05 LAB — BASIC METABOLIC PANEL WITH GFR
Anion gap: 12 (ref 5–15)
BUN: 30 mg/dL — ABNORMAL HIGH (ref 6–20)
CO2: 22 mmol/L (ref 22–32)
Calcium: 9.5 mg/dL (ref 8.9–10.3)
Chloride: 107 mmol/L (ref 98–111)
Creatinine, Ser: 1.97 mg/dL — ABNORMAL HIGH (ref 0.44–1.00)
GFR, Estimated: 29 mL/min — ABNORMAL LOW (ref >60)
Glucose, Bld: 140 mg/dL — ABNORMAL HIGH (ref 70–99)
Potassium: 3.7 mmol/L (ref 3.5–5.1)
Sodium: 141 mmol/L (ref 135–145)

## 2024-07-05 LAB — PRO BRAIN NATRIURETIC PEPTIDE: Pro Brain Natriuretic Peptide: 484 pg/mL — ABNORMAL HIGH (ref <300.0)

## 2024-07-05 LAB — URINALYSIS, MICROSCOPIC (REFLEX)

## 2024-07-05 LAB — CBG MONITORING, ED: Glucose-Capillary: 159 mg/dL — ABNORMAL HIGH (ref 70–99)

## 2024-07-05 MED ORDER — LACTATED RINGERS IV BOLUS
1000.0000 mL | Freq: Once | INTRAVENOUS | Status: AC
  Administered 2024-07-05: 1000 mL via INTRAVENOUS

## 2024-07-05 MED ORDER — CEPHALEXIN 500 MG PO CAPS: 500.0000 mg | ORAL_CAPSULE | Freq: Four times a day (QID) | ORAL | 0 refills | Status: AC

## 2024-07-05 MED ORDER — ACETAMINOPHEN 500 MG PO TABS
1000.0000 mg | ORAL_TABLET | Freq: Once | ORAL | Status: AC
  Administered 2024-07-05: 1000 mg via ORAL
  Filled 2024-07-05: qty 2

## 2024-07-05 NOTE — ED Triage Notes (Addendum)
 Reports feeling lightheaded, headache, confusion this morning. States feels fuzzy but no longer experiencing lightheadedness, headache, confusion.   States was told by PCP she was severely dehydrated   Heart transplant pt

## 2024-07-05 NOTE — ED Notes (Signed)
 Ambulated with pt appx 100'. Pt had a steady gait and stated that she felt much better than when she came in

## 2024-07-05 NOTE — ED Provider Notes (Signed)
 [facility] Provider Note   CSN: [id number] Arrival date & time: 07/05/24  1414     Patient presents with: Dizziness   Alicia Cook is a 58 y.o. female.   This is a 58 year old female presenting emergency department for lightheadedness.  Has had decreased p.o. intake and was told by her cardiologist that she was dehydrated.  She thinks is secondary to her prior weight loss surgery as she notes that she has a difficult time keeping up her p.o. intake.  Today she took a shower, got out of the shower and felt woozy and lightheaded.  This lasted 20 to 30 minutes.  Reports having a minor headache.  Reports her symptoms have improved although she still feels off and has a hard time elaborating what that means.  Not having chest pain, shortness of breath.  She did endorse some minor chest discomfort during episode.  No recent medication changes.   Dizziness      Prior to Admission medications   Medication Sig Start Date End Date Taking? Authorizing Provider  cephALEXin  (KEFLEX ) 500 MG capsule Take 1 capsule (500 mg total) by mouth 4 (four) times daily. 07/05/24  Yes Neysa Caron PARAS, DO  aspirin  EC 81 MG tablet Take 81 mg by mouth daily. Swallow whole.    [provider]  magnesium  hydroxide (MILK OF MAGNESIA) 400 MG/5ML suspension Take 30 mLs by mouth daily as needed for mild constipation.    [provider]  magnesium  oxide (MAG-OX) 400 MG tablet TAKE 1 TABLET BY MOUTH EVERY DAY 06/02/24   Ladona Heinz, MD  midodrine  (PROAMATINE ) 5 MG tablet TAKE 2 TABS BY MOUTH 3 TIMES DAILY WITH MEALS 08/30/23   [provider]  Multiple Vitamin (MULTIVITAMIN WITH MINERALS) TABS tablet Take 1 tablet by mouth daily with lunch.    [provider]  mycophenolate (CELLCEPT) 250 MG capsule TAKE 6 CAPSULES BY MOUTH EVERY 12 HOURS 08/30/23   [provider]  pantoprazole  (PROTONIX ) 40 MG tablet Take 40 mg by mouth  daily as needed (acid reflux). 01/23/20   [provider]  Potassium Chloride  ER 20 MEQ TBCR Take 1 tablet (20 mEq total) by mouth daily. TAKE 2 TABLETS ( ) ON THE DAYS YOU TAKE TORSEMIDE  60. TAKE 1 TABLET ( ) ON THE DAYS YOU TAKE TORSEMIDE  40MG  05/29/23   [place], Harlene HERO, FNP  predniSONE  (DELTASONE ) 5 MG tablet Take 15 mg by mouth daily.    [provider]  rosuvastatin (CRESTOR) 10 MG tablet Take 10 mg by mouth daily.    [provider]  senna-docusate (SENOKOT-S) 8.6-50 MG tablet Take by mouth. 08/27/23   [provider]  sertraline  (ZOLOFT ) 100 MG tablet Take 150 mg by mouth daily.    [provider]  sulfamethoxazole-trimethoprim (BACTRIM) 400-80 MG tablet Take 1 tablet by mouth daily.    [provider]  tacrolimus  (PROGRAF ) 1 MG capsule Take by mouth. Take 5 capsules by mouth every morning and 4 capsule at bedtime 10/05/23 10/04/24  [provider]  tirzepatide (MOUNJARO) 12.5 MG/0.5ML Pen Inject 12.5 mg into the skin once a week.    [provider]  torsemide  (DEMADEX ) 20 MG tablet Take 1 tablet (20 mg total) by mouth daily. TAKE 2 TABLETS (40MG ) ONE DAY ALTERNATING WITH  (20MG ) 1 TABLET DAILY 05/29/23   Glena Harlene HERO, FNP    Allergies: Patient has no known allergies.    Review of Systems  Neurological:  Positive for  dizziness.    Updated Vital Signs BP (!) 122/97   Pulse (!) 107   Temp 98.6 F (37 C) (Oral)   Resp (!) 24   LMP 09/14/1991 (Within Weeks)   SpO2 100%   Physical Exam Vitals and nursing note reviewed.  Constitutional:      General: She is not in acute distress.    Appearance: She is not toxic-appearing.  HENT:     Head: Normocephalic.     Nose: Nose normal.  Eyes:     Conjunctiva/sclera: Conjunctivae normal.  Cardiovascular:     Rate and Rhythm: Regular rhythm. Tachycardia present.  Pulmonary:     Effort: Pulmonary effort is normal.     Breath sounds: Normal breath sounds.   Abdominal:     General: Abdomen is flat. There is no distension.     Tenderness: There is no abdominal tenderness. There is no guarding or rebound.  Musculoskeletal:        General: Normal range of motion.  Skin:    General: Skin is warm and dry.     Capillary Refill: Capillary refill takes less than 2 seconds.  Neurological:     Mental Status: She is alert and oriented to person, place, and time.  Psychiatric:        Mood and Affect: Mood normal.        Behavior: Behavior normal.     (all labs ordered are listed, but only abnormal results are displayed) Labs Reviewed  BASIC METABOLIC PANEL WITH GFR - Abnormal; Notable for the following components:      Result Value   Glucose, Bld 140 (*)    BUN 30 (*)    Creatinine, Ser 1.97 (*)    GFR, Estimated 29 (*)    All other components within normal limits  URINALYSIS, ROUTINE W REFLEX MICROSCOPIC - Abnormal; Notable for the following components:   APPearance HAZY (*)    Bilirubin Urine MODERATE (*)    Protein, ur 100 (*)    All other components within normal limits  PRO BRAIN NATRIURETIC PEPTIDE - Abnormal; Notable for the following components:   Pro Brain Natriuretic Peptide 484.0 (*)    All other components within normal limits  URINALYSIS, MICROSCOPIC (REFLEX) - Abnormal; Notable for the following components:   Bacteria, UA MANY (*)    All other components within normal limits  CBG MONITORING, ED - Abnormal; Notable for the following components:   Glucose-Capillary 159 (*)    All other components within normal limits  CBC  TROPONIN T, HIGH SENSITIVITY    EKG: EKG Interpretation Date/Time:  Saturday July 05 2024 14:41:35 EDT Ventricular Rate:  109 PR Interval:  162 QRS Duration:  108 QT Interval:  338 QTC Calculation: 456 R Axis:   87  Text Interpretation: Sinus tachycardia Probable left atrial enlargement Low voltage, precordial leads Minimal ST elevation, inferior leads Confirmed by Neysa Clap [phone]) on  07/05/2024 3:59:40 PM  Radiology: [facility] Chest 2 View Result Date: 07/05/2024 CLINICAL DATA:  Lightheadedness, headache, and confusion. Dehydration. EXAM: CHEST - 2 VIEW COMPARISON:  12/15/2023. FINDINGS: The heart size and mediastinal contours are within normal limits. Mild atelectasis is present at the left lung base. No consolidation, effusion, or pneumothorax is seen. Degenerative changes are present in the thoracic spine. Sternotomy wires are noted. No acute osseous abnormality. IMPRESSION: No active cardiopulmonary disease. Electronically Signed   By: Leita Birmingham M.D.   On: 07/05/2024 16:14     Procedures   Medications Ordered in  the ED  lactated ringers  bolus 1,000 mL (0 mLs Intravenous Stopped 07/05/24 1630)  acetaminophen  (TYLENOL ) tablet 1,000 mg (1,000 mg Oral Given 07/05/24 1629)    Clinical Course as of 07/05/24 2014  Sat Jul 05, 2024  1545 Per my chart review had Echo 07/01/24: CONCLUSION -------------------------------------------------------------------------------  NORMAL LEFT VENTRICULAR SYSTOLIC FUNCTION WITH SEVERE LVH  ESTIMATED EF: >55%  DIASTOLIC FUNCTION NOT ASSESSED  NORMAL RIGHT VENTRICULAR SYSTOLIC FUNCTION   VALVULAR REGURGITATION: MILD TR  ESTIMATED RVSP: 24 mmHg (Normal)  NO DOPPLER PERFORMED FOR VALVULAR STENOSIS  NORMAL TRAVERSAL OF TRICUSPID VALVE FOR RVB    Compared with prior Echo study on 04/29/2024 NO SIGNIFICANT CHANGES   [TY]  1547 Saw [facility] on 9/2 and per their note: Assessment and Plan: 58 y.o. female S/P OHT for hx of cardiac sarcoidosis. Her course has been c/b early 2R rejection.   # S/p OHT (08/11/2023) She is overall doing well today.  - had 2R rejection (HD stable, normal graft function) in setting of FK 5.1 in 3/12.  - HLAs 09/20/23, has Abs similar to pre-txp but no DSAs. HLAs 4/29 IP - FK goal 8-12. On envarsus  6 mg qd. FK today 19.9. Confirmed trough level. Called patient to hold the dose tomorrow and start 5 mg on 9/3.  - MMF  1000mg  BID - continue pred 7.5, can wean to 5 if Bx negative. Will keep at 5 iso cardiac sarcoidosis - Off Valcyte, has completed 3 months as she is at intermediate risk for CMV infection. CMV today IP - Continuing Bactrim for one year. - Continue ASA, rosuva.   #CKD - stable Cr 1.5, could be from dehydration +/- supratherapeutic FK - encourage more fluid intake and FK adjustment as abpve   [TY]  1653 Patient is feeling improved after IV fluids.  She is complaining of headache that is a normal typical headache for her that she has been getting near weekly some time. No deficits on exam. Moving neck freely. Her workup today largely reassuring.  No leukocytosis.  No anemia.  Seems that she has been having an uptrending creatinine for the past 6 months.  Appears to have urinary tract infection.  Troponin of 19 reassuring.  BNP 484, no signs of acute decompensated HF.  Chest x-ray clear.  Offered admission for further IV fluids and evaluation of her near syncopal episode given her cardiac history.  She declined and stated she would like to follow-up with her primary doctor with whom she has a physical this coming week.  Will ambulate patient, if she can safely ambulate will discharge at her request. Of note, slight elevated HR, however patient notes that that is a normal resting heart rate for her.  [TY]  1700 Ambulated without difficulty. Will discharge.  [TY]    Clinical Course User Index [TY] Neysa Caron PARAS, DO                                 Medical Decision Making This is a 58 year old female with complex past medical history of sarcoidosis, CHF status post cardiac transplant, CKD with baseline creatinine of 1.5 presenting emergency department with lightheadedness.  She is afebrile, slightly tachycardic, soft blood pressure 104/65.  Maintaining oxygen saturation on room air.  Normal neuroexam without focal deficits.  Lower suspicion for TIA/stroke based on her symptoms and history.  Suspect  dehydration/orthostasis.  She was told that she is dehydrated.  Her creatinine  is elevated here with baseline creatinine of 1.5 she is 1.97 today.  No other significant metabolic derangements.  She has no leukocytosis to suggest systemic infection.  No anemia without explain her lightheadedness.  Blood sugar mildly elevated at 159, but does not appear to be in DKA.  Will give IV fluids.  Will also check cardiac enzymes and get chest x-ray.  Will reevaluate after IV fluids.  Amount and/or Complexity of Data Reviewed External Data Reviewed:     Details: See ED course Labs: ordered. Radiology: ordered and independent interpretation performed. ECG/medicine tests:     Details: EKG appears to be sinus tachycardia with a rate of 109 bpm.  Right bundle branch pattern.  Similar to prior.  No STEMI  Risk OTC drugs. Prescription drug management. Decision regarding hospitalization. Diagnosis or treatment significantly limited by social determinants of health.       Final diagnoses:  Lightheadedness    ED Discharge Orders          Ordered    cephALEXin  (KEFLEX ) 500 MG capsule  4 times daily        07/05/24 1701               Neysa Caron PARAS, DO 07/05/24 2014

## 2024-07-05 NOTE — Discharge Instructions (Signed)
 Please follow-up with your primary doctor and cardiologist regarding your symptoms today.  Your kidney function appears to be worsening.  Please increase your fluid intake.  Return for fevers, chills, severe headache, vision loss, facial droop, seizure-like activity, passout, chest pain, shortness of breath, palpitations or feel your heart is racing or you develop any new or worsening symptoms that are concerning to you.  It also appears that you have a urinary tract infection.  We are prescribing antibiotics.  Please take them as prescribed for the full course.

## 2024-07-12 ENCOUNTER — Other Ambulatory Visit: Payer: Self-pay | Admitting: Cardiology

## 2024-07-12 DIAGNOSIS — I5022 Chronic systolic (congestive) heart failure: Secondary | ICD-10-CM

## 2024-09-01 NOTE — Progress Notes (Signed)
° ° °  Alicia Cook, Alicia Cook  Hx: [id number]   09/01/2024  12:43 PM Referring: Devore, Adam    INTERVENTIONAL CARDIOLOGY ATTENDING  The patient is a 58 y.o. 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  For procedural details, please see catheterization report in the results section. This brief note is intended to facilitate communication.  Hemodynamics (mmHg) Site Systolic Diastolic EDP A V Mean Ao  (asc Ao): 115 86    101 LV: 112 2 5 RA:    2 1 2  RV: 19  2    PA: 16 7    11  PCW:    7 6 5   Oximetry Site Hgb (g/dL) O2 sat (%)  PA: 88.8 34.0  PA: 11.7 64.9  FA: 11.4 95.8        Calculations O2 consumption:  232.9 ML O2/min (assumed fick)                EPBF (Qep): 4.9 L/min      Mean Hgb:  11.4 g/dl         Shunt ratio: 1.0 (Qp/Qs)  AV O2:  4.7 Vol%         L to R shunt: 0.0 L/min (Qp-Qep)  PBF (Qp):  4.9 L/min         R to L shunt: 0.0 L/min (Qs-Qep)  Cardiac output (Qs):  4.9 L/min    Cardiac Index:  2.4 L/min/m2    PVR:  1.2 wood units    SVR:  20.2 wood units          Via 7 French right femoral  venous access:  7 French Daig biopsy sheath advanced under fluoroscopic guidance into the right ventricle. Sheath position confirmed by hemodynamic measurements. Septal region identified by right ventriculogram. Three myocardial specimens obtained using the Fehling bioptome and forwarded to pathology in formalin.   State:  Post-Biopsy Assessment state: Inspired O2:  Room air Estimated percent above BMR = 0%    Hemodynamics (mmHg) Site Systolic Diastolic EDP A V Mean   RA:    2 1 2  RV: 19  3      Impressions:  Normal right-sided pressures and cardiac output. Normal LVEDP (~ 3 mmHg.) Normal coronary arteries. The right coronary artery has an anterior origin. No apparent complications.  I was present for the entire procedure.   Recommendations:   Continue transplant follow-up per protocol.   -DFK- /s/   Electronically signed   Alm PHEBE Dais, M.D., A.M., D.M.T., F.A.C.C., F.S.C.A.I.  Associate Professor of Medicine  [employer]  Co-Director, Cardiovascular Late Phase 3 / Devices  [facility]   Pager: [phone] Office: [phone]

## 2024-10-01 NOTE — Telephone Encounter (Signed)
 Medication refilled yesterday.

## 2024-10-02 NOTE — Progress Notes (Signed)
 [facility] Ophthalmology - [facility] Visit Note 10/02/24    CHIEF COMPLAINT Patient presents for 2 weeks CAT SX left eye Post-op Exam  HISTORY OF PRESENT ILLNESS: Alicia Cook is a 58 y.o. female who presents to the clinic today for:  HPI   Here today for 2 week post op OS  Taking pred BID left eye Taking Brom every day left eye Vision improved since surgery left eye  States that in the am, both lower lids are swollen. It subsides around 11am. This has been happening x years, just forgot to mention sooner  Last edited by Harlene Earnie Ming, COA on 10/02/2024  2:26 PM.    CURRENT MEDICATIONS: Medications Ordered Prior to Encounter[1] Referring physician: No referring provider defined for this encounter.  ALLERGIES Allergies[2]  PAST MEDICAL HISTORY Medical History[3] Surgical History[4] FAMILY HISTORY Family History[5] SOCIAL HISTORY Social History[6]   OPHTHALMIC EXAM:  Base Eye Exam     Visual Acuity (Snellen - Linear)       Right Left   Dist [place] 20/25 -1 20/25 -1         Tonometry (Applanation, 2:25 PM)       Right Left   Pressure 19 19         Neuro/Psych     Oriented x3: Yes   Mood/Affect: Normal           Slit Lamp and Fundus Exam     External Exam       Right Left   External Normal Normal         Slit Lamp Exam       Right Left   Lids/Lashes Normal Normal   Conjunctiva/Sclera White and quiet White and quiet   Cornea Debris in tear film, 1+ PEK Clear   Anterior Chamber Deep and quiet Deep and quiet   Iris Round and reactive Round and reactive   Lens PCIOL centered PCIOL           Refraction     Manifest Refraction       Sphere Cylinder Axis Dist VA   Right [place] -0.50 095 20/20   Left [place] -0.50 085 20/20         Manifest Refraction #2 (Auto)       Sphere Cylinder Axis Dist VA   Right [place] -0.50 101    Left [place] -0.50 092              ASSESSMENT/PLAN:  1. Pseudophakia, left eye       2  weeks s/p cataract surgery OS 09/17/24, doing well Stopped Moxifloxacin as instructed Decrease Prednisolone acetate 1% as scheduled:  one drop twice daily for 1 week, then decrease to once daily for 1 week then stop Continue Bromfenac one drop once a day until next appointment No longer need the shield at bedtime or when sleeping, no activity restrictions Patient to call immediately if decreased vision, pain, redness, or new flashes or floaters  Start use of AT's BID both eyes    Follow up:   2 weeks, IOP check, MRx, dilate OS    Medication ordered this visit:  No orders of the defined types were placed in this encounter.  Return in about 2 weeks (around 10/16/2024) for As scheduled 1 month CAT PO OS - dilate OS, IOP check, MRx .  Patient Instructions  Decrease the Prednisolone acetate (pink/white top) to one drop twice daily to the operative eye for 1 week. Then use once daily in  the operative eye until returning to your next appointment.   Continue to use Bromfenac (gray top): one drop once daily until completing the Prednisolone drops.   You no longer have any restrictions in daily activities.  You no longer need to wear the shield when sleeping.  Call immediately if you notice any decreased vision, pain, redness, or new flashes or flashes of lights     Use artificial tears at least 2 times daily plus more as needed   Refresh  Genteal  iVizia  Oasis  Systane   Use lubricant ointment at bedtime *May blur your vision  Genteal gel  Refresh PM  Systane night time  **Avoid Visine, Clear eyes and get the red out drops**    Explained the diagnoses, plan, and follow up with the patient and they expressed understanding.  Patient expressed understanding of the importance of proper follow up care.   This document serves as a record of services personally performed by Delon Hendricks Moores, MD.  It was created on their behalf by Serene Signa Fordyce, a trained medical scribe,  and Ophthalmic Scribe, certified [facility]). During the course of documenting the history, physical exam and medical decision making, I was functioning as a stage manager. The creation of this record is the providers dictation and/or activities during the visit.  Electronically signed by Serene Signa [facility] 10/02/2024 4:47 PM  Abbreviations: M myopia (nearsighted); A astigmatism; H hyperopia (farsighted); P presbyopia; Mrx spectacle prescription;  CTL contact lenses; OD right eye; OS left eye; OU both eyes  XT exotropia; ET esotropia; PEK punctate epithelial keratitis; PEE punctate epithelial erosions; DES dry eye syndrome; MGD meibomian gland dysfunction; ATs artificial tears; PFAT's preservative free artificial tears; NSC nuclear sclerotic cataract; PSC posterior subcapsular cataract; ERM epi-retinal membrane; PVD posterior vitreous detachment; RD retinal detachment; DM diabetes mellitus; DR diabetic retinopathy; NPDR non-proliferative diabetic retinopathy; PDR proliferative diabetic retinopathy; CSME clinically significant macular edema; DME diabetic macular edema; dbh dot blot hemorrhages; CWS cotton wool spot; POAG primary open angle glaucoma; C/D cup-to-disc ratio; HVF humphrey visual field; GVF goldmann visual field; OCT optical coherence tomography; IOP intraocular pressure; BRVO Branch retinal vein occlusion; CRVO central retinal vein occlusion; CRAO central retinal artery occlusion; BRAO branch retinal artery occlusion; RT retinal tear; SB scleral buckle; PPV pars plana vitrectomy; VH Vitreous hemorrhage; PRP panretinal laser photocoagulation; IVK intravitreal kenalog; VMT vitreomacular traction; MH Macular hole;  NVD neovascularization of the disc; NVE neovascularization elsewhere; AREDS age related eye disease study; ARMD age related macular degeneration; POAG primary open angle glaucoma; EBMD epithelial/anterior basement membrane dystrophy; ACIOL anterior chamber intraocular lens; IOL intraocular  lens; PCIOL posterior chamber intraocular lens; Phaco/IOL phacoemulsification with intraocular lens placement; PRK photorefractive keratectomy; LASIK laser assisted in situ keratomileusis; HTN hypertension; DM diabetes mellitus; COPD chronic obstructive pulmonary disease   Electronically signed by: Delon Hendricks Moores, MD 10/02/2024 4:51 PM        [1] Current Outpatient Medications on File Prior to Visit  Medication Sig Dispense Refill   Accu-Chek Guide test strips test strip FOR BLOOD SUGAR MONITORING ONCE/DAY 100 strip 3   acetaminophen  (TYLENOL ) 325 mg tablet Take 650 mg by mouth every 6 (six) hours as needed.     bromfenac 0.09 % drop INSTILL 1 drop in the LEFT eye once a day. Start drops after your arrive home from surgery. 1.7 mL 1   budesonide-formoteroL (Symbicort) 160-4.5 mcg/actuation inhaler Inhale 2 puffs in the morning and 2 puffs before bedtime. 1 each 11   calcium  carbonate-vitamin D3 (OS-CAL + D) 600 mg-10 mcg (400 unit) tablet Take 1 tablet by mouth in the morning and 1 tablet in the evening. Take with meals.     cephALEXin  (KEFLEX ) 500 mg capsule Take 500 mg by mouth 2 (two) times a day.     clobetasoL (TEMOVATE) 0.05 % ointment Apply to scalp up to twice daily. Until it's clear. Never apply to face, armpits, or groin 60 g 3   clonazePAM (KlonoPIN) 0.5 mg tablet Take 1 tablet (0.5 mg total) by mouth daily. 30 tablet 0   cyanocobalamin  (VITAMIN B12) 1,000 mcg subl SL tablet Place  under the tongue.     dapagliflozin  propanediol (Farxiga ) 5 mg tab tablet Take 1 tablet (5 mg total) by mouth daily. 90 tablet 1   Dexcom G7 Sensor Use as directed to monitor blood sugar levels. Follow package directions for replacement. 9 each 1   ergocalciferol  (VITAMIN D2) 1,250 mcg (50,000 unit) capsule TAKE 1 CAPSULE BY MOUTH ONE TIME PER WEEK 12 capsule 1   gabapentin (NEURONTIN) 100 mg capsule Take 100 mg by mouth 3 (three) times a day as needed.     insulin  lispro (HumaLOG  KwikPen) 100 unit/mL KwikPen Inject 5 units once daily for elevated blood sugar >300 15 mL 1   ipratropium (ATROVENT) 42 mcg (0.06 %) spry nasal spray Administer 2 sprays into each nostril 3 (three) times a day.     ketoconazole (NIZORAL) 2 % cream Apply to scaly white  patches 1-2 x/day as needed 60 g 11   Lancets misc For blood sugar monitoring with Accuchek 100 each 3   magnesium  oxide 400 mg (241 mg magnesium ) tab Take 1 tablet by mouth daily.     miscellaneous medical supply misc Drink Glucerna once daily for E11.65 30 each 11   moxifloxacin (VIGAMOX) 0.5 % ophthalmic solution INSTILL 1 drop in the left eye 4 times a day. Start the drop after you arrive home from surgery. 5 mL 1   mycophenolate (CELLCEPT) 500 mg tablet Take 1,500 mg by mouth every 12 (twelve) hours.     pantoprazole  (PROTONIX ) 40 mg EC tablet Take 1 tablet (40 mg total) by mouth every evening. 90 tablet 1   pen needle, diabetic 31 gauge x 15/64 ndle Inject insulin  once daily 100 each 3   prednisoLONE acetate (PRED FORTE) 1 % ophthalmic suspension Instill 1 drop in the left eye 4 times a day. Start after you arrive home from surgery. 5 mL 1   predniSONE  (DELTASONE ) 5 mg tablet Take 10 mg by mouth daily.     sennosides-docusate sodium (PERICOLACE) 8.6-50 mg per tablet Take 1-2 tablets by mouth 2 (two) times a day as needed.     sertraline  200 mg cap Take 1 capsule (200 mg total) by mouth daily. 30 capsule 1   tacrolimus  (ENVARSUS  XR) 1 mg Tb24 extended release tablet Take 5 mg by mouth daily.     tacrolimus  (ENVARSUS  XR) 1 mg Tb24 extended release tablet Take 1 mg by mouth.     tacrolimus  (ENVARSUS  XR) 4 mg Tb24 extended release tablet Take 0.14 mg/kg by mouth daily.     tacrolimus  (PROGRAF ) 1 mg capsule Take 4 mg by mouth.     tirzepatide (Mounjaro) 15 mg/0.5 mL subcutaneous pen injector Inject 0.5 mL (15 mg total) under the skin every 7 days. 6 mL 1   traZODone  (DESYREL ) 50 mg tablet Take 50 mg by mouth  per protocol (see comments).  aspirin  81 mg EC tablet Take 81 mg by mouth daily.     [Paused] potassium chloride  (KLOR-CON ) 20 mEq ER tablet Take 20 mEq by mouth. TAKE 2 TABLETS ( ) ON THE DAYS YOU TAKE TORSEMIDE  60. TAKE 1 TABLET ( ) ON THE DAYS YOU TAKE TORSEMIDE  40MG      rosuvastatin (CRESTOR) 10 mg tablet Take 1 tablet by mouth nightly.     [Paused] valGANciclovir (VALCYTE) 450 mg tablet Take 900 mg by mouth daily with dinner.     No current facility-administered medications on file prior to visit.  [2] Allergies Allergen Reactions   Grass Pollen-June Grass Standard   [3] Past Medical History: Diagnosis Date   Acquired hypothyroidism 09/02/2015   not on medications   ADHD (attention deficit hyperactivity disorder)    Unknown   Allergic    Unknown   Anxiety    Asthma (CMD)    Cardiomyopathy as manifestation of underlying disease    (CMD) 04/07/2016   Cataract    Unknown   CHF (congestive heart failure)    (CMD)    Depression    Diabetes mellitus (CMD)    Dysphagia    Eczema    Unknown   Full dentures    GERD (gastroesophageal reflux disease)    Headache    Heart failure with reduced left ventricular function    (CMD) 07/17/2016   Heart transplanted (CMD) 07/2023   Hiatal hernia    Hyperlipidemia 09/02/2015   Immunocompromised (CMD)    Myocardial infarction    (CMD) [id number]   Primary osteoarthritis of left knee 06/11/2020   Primary osteoarthritis of right knee 06/11/2020   PSVT (paroxysmal supraventricular tachycardia) (CMD) 09/02/2015   Rash 11/09/2015   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   RLS (restless legs syndrome)    Sarcoid uveitis of both eyes    Sarcoidosis 2000   Sarcoidosis of skin (CMD)    Visual impairment    Caracts   Wheezing 12/07/2021  [4] Past Surgical History: Procedure Laterality Date   ABDOMINAL SURGERY      Procedure: ABDOMINAL SURGERY; Abdominal Cyst   CARDIAC CATHETERIZATION  2017    Procedure: CARDIAC CATHETERIZATION   CARDIAC DEFIBRILLATOR PLACEMENT  12/2014   Procedure: CARDIAC DEFIBRILLATOR PLACEMENT; [employer] placed in [place]   CATARACT EXTRACTION W/  INTRAOCULAR LENS IMPLANT Right 12/04/2018   Procedure: PHACOEMULSIFICATION PC / IOL TOPICAL;  Surgeon: Devere Gaines Burden, MD;  Location: [facility] MAIN OR;  Service: Ophthalmology;  Laterality: Right;   CATARACT EXTRACTION W/  INTRAOCULAR LENS IMPLANT Left 09/17/2024   Dr. Matilde  [id number]  23.0D   CATARACT EXTRACTION W/ INTRAOCULAR LENS IMPLANT Left 09/17/2024   EXTRACTION CATARACT LEFT EYE WITH INTRAOCULAR LENS INSERTION performed by Delon Hendricks Matilde, MD at [facility] OR   CESAREAN SECTION, CLASSICAL     CESAREAN SECTION, UNSPECIFIED     Procedure: CESAREAN SECTION   CHOLECYSTECTOMY     Procedure: CHOLECYSTECTOMY   COLONOSCOPY     Procedure: COLONOSCOPY   ESOPHAGOGASTRODUODENOSCOPY N/A 02/13/2020   Procedure: EGD;  Surgeon: Gunnar Chris Daniels, MD;  Location: [facility] ENDO OR;  Service: Gastroenterology;  Laterality: N/A;   LYMPH NODE BIOPSY     SLEEVE GASTROPLASTY N/A 12/20/2020   Procedure: SLEEVE GASTRECTOMY LAPAROSCOPIC;  Surgeon: Prentice Tanda Pizza, MD;  Location: [facility] MAIN OR;  Service: General;  Laterality: N/A;   TUBAL LIGATION    [5] Family History Problem Relation Name Age of Onset   Breast cancer Mother Merlynn christians  died 2000   Depression Mother Merlynn christians    Cancer Mother Merlynn christians        Breast   Mental illness Mother Merlynn logan    Diabetes Father Tonye Tancredi    Hypertension Father Cina Klumpp    Heart disease Father Pietrina Jagodzinski    Hyperlipidemia Father Nixie Laube    Kidney disease Father Garnet Chatmon    Arthritis Father Briona Korpela    Vision loss Father Lamonica Trueba    Colon cancer Paternal Uncle     Hypertension Maternal Grandmother Sophia logan    Breast cancer Maternal Grandmother Sophia logan        died   Colon polyps Maternal Grandmother  Sophia logan    Diabetes Paternal Grandmother Died    Hypertension Paternal Grandmother Died    Heart disease Paternal Grandmother Died    Heart disease Paternal Grandfather Victory Freshour    Hypertension Paternal Grandfather Victory Brabson    Diabetes Paternal Grandfather Victory Benevides    Asthma Maternal Grandfather N/a    Arthritis Son Kabria Hetzer    Depression Daughter Deleta Monteverde    Eczema Neg Hx     Psoriasis Neg Hx     Glaucoma Neg Hx     Macular degeneration Neg Hx    [6] Social History Tobacco Use   Smoking status: Former    Current packs/day: 0.00    Average packs/day: 0.3 packs/day for 29.4 years (7.5 ttl pk-yrs)    Types: Cigarettes    Start date: 11/08/1994    Quit date: 11/08/2014    Years since quitting: 9.9    Passive exposure: Past   Smokeless tobacco: Never  Vaping Use   Vaping status: Never Used  Substance Use Topics   Alcohol use: No   Drug use: No

## 2024-10-08 NOTE — Telephone Encounter (Signed)
 Left message on machine to rtc

## 2024-10-11 ENCOUNTER — Other Ambulatory Visit: Payer: Self-pay

## 2024-10-11 ENCOUNTER — Emergency Department (HOSPITAL_BASED_OUTPATIENT_CLINIC_OR_DEPARTMENT_OTHER)
Admission: EM | Admit: 2024-10-11 | Discharge: 2024-10-11 | Disposition: A | Discharge status: Other Acute Inpatient Hospital | Attending: Emergency Medicine | Admitting: Emergency Medicine

## 2024-10-11 ENCOUNTER — Emergency Department (HOSPITAL_BASED_OUTPATIENT_CLINIC_OR_DEPARTMENT_OTHER)

## 2024-10-11 ENCOUNTER — Encounter (HOSPITAL_BASED_OUTPATIENT_CLINIC_OR_DEPARTMENT_OTHER): Payer: Self-pay | Admitting: Emergency Medicine

## 2024-10-11 DIAGNOSIS — R0689 Other abnormalities of breathing: Secondary | ICD-10-CM | POA: Diagnosis not present

## 2024-10-11 DIAGNOSIS — R531 Weakness: Secondary | ICD-10-CM | POA: Diagnosis not present

## 2024-10-11 DIAGNOSIS — Z743 Need for continuous supervision: Secondary | ICD-10-CM | POA: Diagnosis not present

## 2024-10-11 DIAGNOSIS — R0781 Pleurodynia: Secondary | ICD-10-CM

## 2024-10-11 DIAGNOSIS — Z941 Heart transplant status: Secondary | ICD-10-CM

## 2024-10-11 DIAGNOSIS — R000 Tachycardia, unspecified: Secondary | ICD-10-CM

## 2024-10-11 DIAGNOSIS — I3139 Other pericardial effusion (noninflammatory): Secondary | ICD-10-CM

## 2024-10-11 LAB — HEPATIC FUNCTION PANEL
ALT: 25 U/L (ref 0–44)
AST: 31 U/L (ref 15–41)
Albumin: 4.6 g/dL (ref 3.5–5.0)
Alkaline Phosphatase: 156 U/L — ABNORMAL HIGH (ref 38–126)
Bilirubin, Direct: 0.2 mg/dL (ref 0.0–0.2)
Indirect Bilirubin: 0.2 mg/dL — ABNORMAL LOW (ref 0.3–0.9)
Total Bilirubin: 0.4 mg/dL (ref 0.0–1.2)
Total Protein: 7.1 g/dL (ref 6.5–8.1)

## 2024-10-11 LAB — URINALYSIS, ROUTINE W REFLEX MICROSCOPIC
Bilirubin Urine: NEGATIVE
Glucose, UA: >=500 mg/dL — AB
Hgb urine dipstick: NEGATIVE
Ketones, ur: 15 mg/dL — AB
Leukocytes,Ua: NEGATIVE
Nitrite: NEGATIVE
Protein, ur: 30 mg/dL — AB
Specific Gravity, Urine: 1.02 (ref 1.005–1.030)
pH: 7 (ref 5.0–8.0)

## 2024-10-11 LAB — URINALYSIS, MICROSCOPIC (REFLEX)
Bacteria, UA: NONE SEEN
RBC / HPF: NONE SEEN RBC/hpf (ref 0–5)
WBC, UA: NONE SEEN WBC/hpf (ref 0–5)

## 2024-10-11 LAB — D-DIMER, QUANTITATIVE: D-Dimer, Quant: <0.27 ug{FEU}/mL (ref 0.00–0.50)

## 2024-10-11 LAB — BASIC METABOLIC PANEL WITH GFR
Anion gap: 11 (ref 5–15)
BUN: 19 mg/dL (ref 6–20)
CO2: 28 mmol/L (ref 22–32)
Calcium: 9.6 mg/dL (ref 8.9–10.3)
Chloride: 104 mmol/L (ref 98–111)
Creatinine, Ser: 0.96 mg/dL (ref 0.44–1.00)
GFR, Estimated: >60 mL/min (ref >60)
Glucose, Bld: 115 mg/dL — ABNORMAL HIGH (ref 70–99)
Potassium: 3.2 mmol/L — ABNORMAL LOW (ref 3.5–5.1)
Sodium: 143 mmol/L (ref 135–145)

## 2024-10-11 LAB — CBC
HCT: 40.4 % (ref 36.0–46.0)
Hemoglobin: 13 g/dL (ref 12.0–15.0)
MCH: 27.5 pg (ref 26.0–34.0)
MCHC: 32.2 g/dL (ref 30.0–36.0)
MCV: 85.4 fL (ref 80.0–100.0)
Platelets: 248 K/uL (ref 150–400)
RBC: 4.73 MIL/uL (ref 3.87–5.11)
RDW: 12.1 % (ref 11.5–15.5)
WBC: 15.5 K/uL — ABNORMAL HIGH (ref 4.0–10.5)
nRBC: 0 % (ref 0.0–0.2)

## 2024-10-11 LAB — TROPONIN T, HIGH SENSITIVITY
Troponin T High Sensitivity: <15 ng/L (ref 0–19)
Troponin T High Sensitivity: <15 ng/L (ref 0–19)

## 2024-10-11 LAB — PROTIME-INR
INR: 0.8 (ref 0.8–1.2)
Prothrombin Time: 12 s (ref 11.4–15.2)

## 2024-10-11 LAB — LIPASE, BLOOD: Lipase: 36 U/L (ref 11–51)

## 2024-10-11 LAB — RESP PANEL BY RT-PCR (RSV, FLU A&B, COVID)  RVPGX2
Influenza A by PCR: NEGATIVE
Influenza B by PCR: NEGATIVE
Resp Syncytial Virus by PCR: NEGATIVE
SARS Coronavirus 2 by RT PCR: NEGATIVE

## 2024-10-11 LAB — CBG MONITORING, ED: Glucose-Capillary: 121 mg/dL — ABNORMAL HIGH (ref 70–99)

## 2024-10-11 MED ORDER — METOCLOPRAMIDE HCL 5 MG/ML IJ SOLN
10.0000 mg | Freq: Once | INTRAMUSCULAR | Status: AC
  Administered 2024-10-11: 10 mg via INTRAVENOUS
  Filled 2024-10-11: qty 2

## 2024-10-11 MED ORDER — FENTANYL CITRATE (PF) 50 MCG/ML IJ SOSY
50.0000 ug | PREFILLED_SYRINGE | Freq: Once | INTRAMUSCULAR | Status: AC
  Administered 2024-10-11: 50 ug via INTRAVENOUS
  Filled 2024-10-11: qty 1

## 2024-10-11 MED ORDER — IOHEXOL 300 MG/ML  SOLN
100.0000 mL | Freq: Once | INTRAMUSCULAR | Status: AC | PRN
  Administered 2024-10-11: 100 mL via INTRAVENOUS

## 2024-10-11 MED ORDER — MORPHINE SULFATE (PF) 4 MG/ML IV SOLN
4.0000 mg | Freq: Once | INTRAVENOUS | Status: AC
  Administered 2024-10-11: 4 mg via INTRAVENOUS
  Filled 2024-10-11: qty 1

## 2024-10-11 NOTE — ED Notes (Signed)
 Patient departed [facility] ED with [facility] at this time.

## 2024-10-11 NOTE — ED Notes (Signed)
Dr Butler at bedside.  

## 2024-10-11 NOTE — ED Notes (Signed)
Patient transported to imaging at this time.

## 2024-10-11 NOTE — ED Notes (Signed)
 Patient transferred from waiting room to ED treatment room. Assuming pt care at this time.

## 2024-10-11 NOTE — ED Notes (Signed)
 Called lab to run hepatic function panel, D-dimer, and lipase off of blood sample sent to lab previously. Per lab, they will put this into process now.

## 2024-10-11 NOTE — ED Notes (Signed)
 CBG= 121

## 2024-10-11 NOTE — ED Notes (Signed)
 Spoke with Eva at [facility]. No ground unit available, will put on wait list. [facility] called regarding transfer to [facility] as well, will place on wait list

## 2024-10-11 NOTE — ED Notes (Signed)
 Lab requested new sample due to prior use of LtG tube.

## 2024-10-11 NOTE — ED Triage Notes (Signed)
 Pt c/ LT CP x couple hours; also reports pain on most of LT side (back, shoulder, neck, jaw, arm); heart transplant Oct 2024

## 2024-10-11 NOTE — ED Provider Notes (Signed)
 [place] [facility] Provider Note   CSN: [id number] Arrival date & time: 10/11/24  1353     Patient presents with: Chest Pain   Alicia Cook is a 58 y.o. female with an extensive past medical history including sarcoidosis, type 2 diabetes, heart failure with reduced left ventricular function, status post heart transplant formed at [facility], status post laparoscopic gastric sleeve.  She is chronically immunocompromised on tacrolimus , mycophenolate and 5 mg of steroid daily.  She had sudden onset of left-sided pleuritic chest pain which is worse with deep breathing starting at 11:30 AM this morning.  She reports that the pain radiates into the left neck left shoulder and down the left arm.  At first she thought it was related to gas because she has not had a bowel movement last 3 days.  She does have history of abdominal surgeries she denies hemoptysis nausea or vomiting.    Chest Pain      Prior to Admission medications  Medication Sig Start Date End Date Taking? Authorizing Provider  aspirin  EC 81 MG tablet Take 81 mg by mouth daily. Swallow whole.    [provider]  cephALEXin  (KEFLEX ) 500 MG capsule Take 1 capsule (500 mg total) by mouth 4 (four) times daily. 07/05/24   Neysa Caron PARAS, DO  magnesium  hydroxide (MILK OF MAGNESIA) 400 MG/5ML suspension Take 30 mLs by mouth daily as needed for mild constipation.    [provider]  magnesium  oxide (MAG-OX) 400 MG tablet TAKE 1 TABLET BY MOUTH EVERY DAY 07/14/24   Ladona Heinz, MD  midodrine  (PROAMATINE ) 5 MG tablet TAKE 2 TABS BY MOUTH 3 TIMES DAILY WITH MEALS 08/30/23   [provider]  Multiple Vitamin (MULTIVITAMIN WITH MINERALS) TABS tablet Take 1 tablet by mouth daily with lunch.    [provider]  mycophenolate (CELLCEPT) 250 MG capsule TAKE 6 CAPSULES BY MOUTH EVERY 12 HOURS 08/30/23   [provider]  pantoprazole  (PROTONIX ) 40 MG tablet Take  40 mg by mouth daily as needed (acid reflux). 01/23/20   [provider]  Potassium Chloride  ER 20 MEQ TBCR Take 1 tablet (20 mEq total) by mouth daily. TAKE 2 TABLETS ( ) ON THE DAYS YOU TAKE TORSEMIDE  60. TAKE 1 TABLET ( ) ON THE DAYS YOU TAKE TORSEMIDE  40MG  05/29/23   Glena Harlene HERO, FNP  predniSONE  (DELTASONE ) 5 MG tablet Take 15 mg by mouth daily.    [provider]  rosuvastatin (CRESTOR) 10 MG tablet Take 10 mg by mouth daily.    [provider]  senna-docusate (SENOKOT-S) 8.6-50 MG tablet Take by mouth. 08/27/23   [provider]  sertraline  (ZOLOFT ) 100 MG tablet Take 150 mg by mouth daily.    [provider]  sulfamethoxazole-trimethoprim (BACTRIM) 400-80 MG tablet Take 1 tablet by mouth daily.    [provider]  tirzepatide CLOYDE) 12.5 MG/0.5ML Pen Inject 12.5 mg into the skin once a week.    [provider]  torsemide  (DEMADEX ) 20 MG tablet Take 1 tablet (20 mg total) by mouth daily. TAKE 2 TABLETS (40MG ) ONE DAY ALTERNATING WITH  (20MG ) 1 TABLET DAILY 05/29/23   Glena Harlene HERO, FNP    Allergies: Patient has no known allergies.    Review of Systems  Cardiovascular:  Positive for chest pain.    Updated Vital Signs BP (!) 151/97 (BP Location: Left Arm)   Pulse (!) 113   Temp 98.3 F (36.8 C) (Oral)   Resp  20   Ht 5' 7 (1.702 m)   Wt 88 kg   LMP 09/14/1991   SpO2 100%   BMI 30.38 kg/m   Physical Exam Vitals and nursing note reviewed.  Constitutional:      General: She is not in acute distress.    Appearance: She is well-developed. She is not diaphoretic.  HENT:     Head: Normocephalic and atraumatic.     Right Ear: External ear normal.     Left Ear: External ear normal.     Nose: Nose normal.     Mouth/Throat:     Mouth: Mucous membranes are moist.  Eyes:     General: No scleral icterus.    Extraocular Movements: Extraocular movements intact.     Conjunctiva/sclera: Conjunctivae  normal.     Pupils: Pupils are equal, round, and reactive to light.  Cardiovascular:     Rate and Rhythm: Normal rate and regular rhythm.     Heart sounds: Normal heart sounds. No murmur heard.    No friction rub. No gallop.  Pulmonary:     Effort: Pulmonary effort is normal. No accessory muscle usage, prolonged expiration or respiratory distress.     Breath sounds: Normal breath sounds.     Comments: Breathing appears splinted due to sharp pain.  Patient is wincing.  Breathing appears increased due to this. Abdominal:     General: Bowel sounds are normal. There is no distension.     Palpations: Abdomen is soft. There is no mass.     Tenderness: There is no abdominal tenderness. There is no guarding.     Comments: Midline surgical scar in the abdomen.  Tenderness in the upper epigastric abdominal region.  Musculoskeletal:     Cervical back: Normal range of motion.  Skin:    General: Skin is warm and dry.  Neurological:     Mental Status: She is alert and oriented to person, place, and time.  Psychiatric:        Behavior: Behavior normal.     (all labs ordered are listed, but only abnormal results are displayed) Labs Reviewed  BASIC METABOLIC PANEL WITH GFR - Abnormal; Notable for the following components:      Result Value   Potassium 3.2 (*)    Glucose, Bld 115 (*)    All other components within normal limits  CBC - Abnormal; Notable for the following components:   WBC 15.5 (*)    All other components within normal limits  HEPATIC FUNCTION PANEL - Abnormal; Notable for the following components:   Alkaline Phosphatase 156 (*)    Indirect Bilirubin 0.2 (*)    All other components within normal limits  URINALYSIS, ROUTINE W REFLEX MICROSCOPIC - Abnormal; Notable for the following components:   APPearance HAZY (*)    Glucose, UA >=500 (*)    Ketones, ur 15 (*)    Protein, ur 30 (*)    All other components within normal limits  CBG MONITORING, ED - Abnormal; Notable for the  following components:   Glucose-Capillary 121 (*)    All other components within normal limits  RESP PANEL BY RT-PCR (RSV, FLU A&B, COVID)  [id number]  PROTIME-INR  LIPASE, BLOOD  D-DIMER, QUANTITATIVE  URINALYSIS, MICROSCOPIC (REFLEX)  TROPONIN T, HIGH SENSITIVITY  TROPONIN T, HIGH SENSITIVITY    EKG: EKG Interpretation Date/Time:  Saturday October 11 2024 14:03:20 EST Ventricular Rate:  110 PR Interval:  168 QRS Duration:  110 QT Interval:  332 QTC Calculation: 450  R Axis:   85  Text Interpretation: Sinus tachycardia Low voltage, precordial leads RSR' in V1 or V2, right VCD or RVH Abnormal T, consider ischemia, lateral leads No significant change since prior 9/25 Confirmed by Towana Sharper [phone]) on 10/11/2024 2:24:06 PM  Radiology: CT ABDOMEN PELVIS W CONTRAST Result Date: 10/11/2024 EXAM: CT ABDOMEN AND PELVIS WITH CONTRAST 10/11/2024 04:56:22 PM TECHNIQUE: CT of the abdomen and pelvis was performed with the administration of 100 mL of iohexol  (OMNIPAQUE ) 300 MG/ML solution. Multiplanar reformatted images are provided for review. Automated exposure control, iterative reconstruction, and/or weight-based adjustment of the mA/kV was utilized to reduce the radiation dose to as low as reasonably achievable. COMPARISON: None available. CLINICAL HISTORY: Bowel obstruction suspected; hx of cholecystectomy FINDINGS: LOWER CHEST: Stable cardiomegaly with moderate pericardial effusion, mildly progressive. Median sternotomy. Mild linear atelectasis in the left lower lobe. LIVER: The liver is unremarkable. GALLBLADDER AND BILE DUCTS: Post cholecystectomy. No biliary ductal dilatation. SPLEEN: No acute abnormality. PANCREAS: No acute abnormality. ADRENAL GLANDS: No acute abnormality. KIDNEYS, URETERS AND BLADDER: No stones in the kidneys or ureters. No hydronephrosis. No perinephric or periureteral stranding. Urinary bladder is unremarkable. GI AND BOWEL: Postsurgical changes related to gastric  sleeve. Normal appendix (image 72). There is no bowel obstruction. PERITONEUM AND RETROPERITONEUM: No ascites. No free air. VASCULATURE: Aorta is normal in caliber. LYMPH NODES: No lymphadenopathy. REPRODUCTIVE ORGANS: The uterus is unremarkable. BONES AND SOFT TISSUES: Mild degenerative changes of the lower thoracic spine. No acute osseous abnormality. No focal soft tissue abnormality. IMPRESSION: 1. No acute findings in the abdomen/pelvis. 2. Postsurgical changes, as above. 3. Cardiomegaly with moderate pericardial effusion, mildly progressive. Electronically signed by: Pinkie Pebbles MD 10/11/2024 05:17 PM EST RP Workstation: [id number]   DG Chest 2 View Result Date: 10/11/2024 CLINICAL DATA:  Chest Pain EXAM: CHEST - 2 VIEW COMPARISON:  July 05, 2024 FINDINGS: The cardiomediastinal silhouette is unchanged in contour.Status post median sternotomy. Calcified granuloma of the RIGHT lower lobe. No pleural effusion. No pneumothorax. No acute pleuroparenchymal abnormality. Visualized abdomen is unremarkable. Multilevel degenerative changes of the thoracic spine. IMPRESSION: No acute cardiopulmonary abnormality. Electronically Signed   By: Corean Salter M.D.   On: 10/11/2024 14:56     Procedures   Medications Ordered in the ED  fentaNYL  (SUBLIMAZE ) injection 50 mcg (50 mcg Intravenous Given 10/11/24 1444)  morphine  (PF) 4 MG/ML injection 4 mg (4 mg Intravenous Given 10/11/24 1531)  metoCLOPramide  (REGLAN ) injection 10 mg (10 mg Intravenous Given 10/11/24 1609)  iohexol  (OMNIPAQUE ) 300 MG/ML solution 100 mL (100 mLs Intravenous Contrast Given 10/11/24 1647)    Clinical Course as of 10/11/24 1854  Sat Oct 11, 2024  4111 58 year old female with history of heart transplant follows at [facility] here with some pain in her left chest under her left breast radiating into her shoulder and upper back down her left arm.  Started a few hours ago.  Has not had this pain before.  Getting lab work imaging EKG  and pain medication.  Disposition per results of testing. [MB]  1627 WBC(!): 15.5 [AH]  1720 Basic metabolic panel(!) [AH]  1720 CBC(!) [AH]  1720 WBC(!): 15.5 [AH]  1729 CT ABDOMEN PELVIS W CONTRAST Incidental finding of a pericardial effusion which appears new from previous echocardiogram 1 month ago.  I placed a call to her transplant team at [facility].  No other acute findings on workup so far. [AH]    Clinical Course User Index [AH] Corin Tilly, PA-C [MB] Butler, Michael C, MD  Medical Decision Making Amount and/or Complexity of Data Reviewed Labs: ordered. Decision-making details documented in ED Course. Radiology: ordered. Decision-making details documented in ED Course.  Risk Prescription drug management.   This patient presents to the ED for concern of sharp chest pain, this involves an extensive number of treatment options, and is a complaint that carries with it a high risk of complications and morbidity.  Differential diagnosis includes but is not limited to ACS, PE, pericarditis, myocarditis, gastric volvulus, less likely acute pancreatitis or gastroenteritis.  Doubt perforated peptic ulcer or bowel obstruction.  Co morbidities:  history of heart transplant, immunocompromised, previous multiple abdominal surgeries  Social Determinants of Health:  . SDOH Screenings   Food Insecurity: Low Risk (04/06/2024)   Received from [facility]  Housing: Low Risk (04/06/2024)   Received from [facility]  Recent Concern: Housing - High Risk (02/26/2024)   Received from [facility]  Transportation Needs: Unmet Transportation Needs (04/06/2024)   Received from [facility]  Utilities: Low Risk (04/06/2024)   Received from [facility]  Financial Resource Strain: High Risk (05/07/2023)  Social Connections: Unknown (05/08/2023)   Received from [facility]  Tobacco Use: Medium Risk (10/11/2024)    Additional history:  {Additional  history obtained from emr   Lab Tests:  I Ordered, and personally interpreted labs.  The pertinent results include:   As per ED course, slightly elevated white blood cell count at 15.5. 2 negative troponins, negative D-dimer, Remainder of labs are unremarkable  Imaging Studies:  I ordered imaging studies including chest x-ray, CT abdomen and pelvis I independently visualized and interpreted imaging which showed moderate size pericardial effusion I agree with the radiologist interpretation  Cardiac Monitoring/ECG:  The patient was maintained on a cardiac monitor.  I personally viewed and interpreted the cardiac monitored which showed an underlying rhythm of: Persistent sinus tachycardia  Medicines ordered and prescription drug management:  I ordered medication including  Medications  fentaNYL  (SUBLIMAZE ) injection 50 mcg (50 mcg Intravenous Given 10/11/24 1444)  morphine  (PF) 4 MG/ML injection 4 mg (4 mg Intravenous Given 10/11/24 1531)  metoCLOPramide  (REGLAN ) injection 10 mg (10 mg Intravenous Given 10/11/24 1609)  iohexol  (OMNIPAQUE ) 300 MG/ML solution 100 mL (100 mLs Intravenous Contrast Given 10/11/24 1647)   for sharp, pleuritic chest pain Reevaluation of the patient after these medicines showed that the patient improved I have reviewed the patients home medicines and have made adjustments as needed  Test Considered:     Critical Interventions:    Consultations Obtained: I consulted with [facility] Transfer line- patient is accepted ED to ED by transplant team for further evaluation by Dr. Nanci Blanch.  Problem List / ED Course:     ICD-10-CM   1. Pericardial effusion  I31.39     2. Tachycardia  R00.0     3. Pleuritic chest pain  R07.81     4. Heart transplant recipient [facility])  Z94.1       MDM: Patient here with persistent tachycardia, sharp pleuritic chest pain, tachycardia.  She is status post heart transplant.  Findings of incidental pericardial effusion  moderate in size on her ET abdomen.  Patient accepted in transfer to [facility] emergency department.  Patient understands the plan of care and is stable    Dispostion:  After consideration of the diagnostic results and the patients response to treatment, I feel that the patent would benefit from    Final diagnoses:  Pericardial effusion  Tachycardia  Pleuritic chest pain  Heart transplant recipient [facility])    ED Discharge Orders     None          Arloa Chroman, PA-C 10/11/24 1856    Towana Ozell BROCKS, MD 10/12/24 [phone]

## 2024-10-27 ENCOUNTER — Other Ambulatory Visit (HOSPITAL_COMMUNITY): Payer: Self-pay | Admitting: Family Medicine

## 2024-11-22 ENCOUNTER — Other Ambulatory Visit: Payer: Self-pay

## 2024-11-22 ENCOUNTER — Ambulatory Visit
Admission: EM | Admit: 2024-11-22 | Discharge: 2024-11-22 | Disposition: A | Discharge status: Home / Self Care | Attending: Internal Medicine | Admitting: Internal Medicine

## 2024-11-22 ENCOUNTER — Encounter: Payer: Self-pay | Admitting: Emergency Medicine

## 2024-11-22 DIAGNOSIS — R42 Dizziness and giddiness: Secondary | ICD-10-CM | POA: Diagnosis not present

## 2024-11-22 LAB — POCT URINALYSIS DIP (MANUAL ENTRY)
Blood, UA: NEGATIVE
Glucose, UA: 500 mg/dL — AB
Ketones, POC UA: NEGATIVE mg/dL
Leukocytes, UA: NEGATIVE
Nitrite, UA: NEGATIVE
Protein Ur, POC: 100 mg/dL — AB
Spec Grav, UA: >=1.03 — AB
Urobilinogen, UA: 0.2 U/dL
pH, UA: 5.5

## 2024-11-22 LAB — POCT FASTING CBG KUC MANUAL ENTRY: POCT Glucose (KUC): 94 mg/dL (ref 70–99)

## 2024-11-22 NOTE — ED Provider Notes (Signed)
 " [facility]    CSN: [id number] Arrival date & time: 11/22/24  1402      History   Chief Complaint Chief Complaint  Patient presents with   Dizziness    HPI Alicia Cook is a 59 y.o. female.   59 year old female who presents urgent care with complaints of dizziness and just not feeling right.  She reports this has been going on for several days.  She feels like she has a lot of inflammation.  She has not had any syncopal episodes.  She has been able to do her regular activity.  She reports that she was at work today and decided she would come by because the dizziness has just not been getting any better.  She does have a history of heart transplant in the past and is on rejection medication.  She has a history of sarcoidosis as well and used to be on methotrexate and felt that she was more well-controlled when she was on this medication.  She denies any fevers or chills, nausea or vomiting, recent illness, chest pain, shortness of breath.   Dizziness Associated symptoms: no chest pain, no palpitations, no shortness of breath and no vomiting     Past Medical History:  Diagnosis Date   Cataract    CHF (congestive heart failure) (HCC)    Depression    Diabetes mellitus without complication (HCC)    Encounter for adjustment of biventricular implantable cardioverter-defibrillator (ICD) 06/13/2019   GERD (gastroesophageal reflux disease)    Hypertension    ICD: Cardiac defibrillator in situ 01/13/2015   [employer] Bi-V ICD (Dynagen X4 CRT-D) 01/13/2015 at [facility] - MRI Compatible  Scheduled Remote ICD check 4.22.20: 1 SVT/8 secs, normal function. Battery longevity is 8 years. RA pacing is 1 %, RV pacing is 99 %, and LV pacing is 99 %.   Nonischemic cardiomyopathy (HCC)    a. EF 35-40% by cath in 2011 with normal cors b. s/p ICD implantation in 12/2014 c. 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   Salmonella gastroenteritis 07/19/2021   SIRS (systemic inflammatory response syndrome) (HCC) 07/18/2021   Depression 07/18/2021   Hypokalemia 07/18/2021   Headache 07/18/2021   Hypotension    Pericardial effusion    Fever 07/16/2021   Encounter for adjustment of biventricular implantable cardioverter-defibrillator (ICD) 06/13/2019   Palpitations 12/05/2018   OSA (obstructive sleep apnea) 11/04/2018   Lung disease 01/17/2017   RLS (restless legs syndrome) 01/17/2017   Gastroesophageal reflux disease without esophagitis 11/30/2016   Chest pain 02/08/2016   Sarcoidosis 02/08/2016   Benign essential HTN 02/08/2016   Nonischemic cardiomyopathy (HCC) 02/08/2016   Non-insulin  dependent type 2 diabetes mellitus (HCC) 02/08/2016   Chronic HFrEF (heart failure with reduced ejection fraction) (HCC) 02/08/2016   Acquired hypothyroidism 09/02/2015   Heart failure with reduced left ventricular function (HCC) 09/02/2015   Hyperlipidemia 09/02/2015   PSVT (paroxysmal supraventricular tachycardia) 09/02/2015   Biventricular ICD (implantable cardioverter-defibrillator) in place 01/13/2015    Past Surgical History:  Procedure Laterality Date   ABDOMINAL SURGERY  12/20/2020  laparoscopic sleeve gastrectomy   CARDIAC CATHETERIZATION N/A 02/08/2016   Procedure: Left Heart Cath and Coronary Angiography;  Surgeon: Candyce GORMAN Reek, MD;  Location: [facility] INVASIVE CV LAB;  Service: Cardiovascular;  Laterality: N/A;   CATARACT EXTRACTION Right 12/04/2018   CESAREAN SECTION     CHOLECYSTECTOMY     HEART TRANSPLANT Bilateral    INSERTION OF ICD     a.  s/p dual-chamber [employer] ICD 12/2014   LAPAROSCOPIC GASTRIC SLEEVE RESECTION     PACEMAKER INSERTION     RIGHT/LEFT HEART CATH AND CORONARY ANGIOGRAPHY N/A 07/12/2021   Procedure: RIGHT/LEFT HEART CATH AND CORONARY ANGIOGRAPHY;  Surgeon: Elmira Newman PARAS, MD;  Location: [facility] INVASIVE CV LAB;  Service: Cardiovascular;  Laterality: N/A;   RIGHT/LEFT HEART CATH AND CORONARY ANGIOGRAPHY N/A 04/11/2023   Procedure: RIGHT/LEFT HEART CATH AND CORONARY ANGIOGRAPHY;  Surgeon: Cherrie Toribio SAUNDERS, MD;  Location: [facility] INVASIVE CV LAB;  Service: Cardiovascular;  Laterality: N/A;   TUBAL LIGATION      OB History   No obstetric history on file.      Home Medications    Prior to Admission medications  Medication Sig Start Date End Date Taking? Authorizing Provider  aspirin  EC 81 MG tablet Take 81 mg by mouth daily. Swallow whole.   Yes [provider]  magnesium  oxide (MAG-OX) 400 MG tablet TAKE 1 TABLET BY MOUTH EVERY DAY 07/14/24  Yes Ladona Heinz, MD  Multiple Vitamin (MULTIVITAMIN WITH MINERALS) TABS tablet Take 1 tablet by mouth daily with lunch.   Yes [provider]  mycophenolate (CELLCEPT) 250 MG capsule TAKE 6 CAPSULES BY MOUTH EVERY 12 HOURS 08/30/23  Yes [provider]  pantoprazole  (PROTONIX ) 40 MG tablet Take 40 mg by mouth daily as needed (acid reflux). 01/23/20  Yes [provider]  Potassium Chloride  ER 20 MEQ TBCR Take 1 tablet (20 mEq total) by mouth daily. TAKE 2 TABLETS ( ) ON THE DAYS YOU TAKE TORSEMIDE  60. TAKE 1 TABLET ( ) ON THE DAYS YOU TAKE TORSEMIDE  40MG  05/29/23  Yes [place], Harlene HERO, FNP  predniSONE  (DELTASONE ) 5 MG tablet Take 15 mg by mouth daily.   Yes [provider]  rosuvastatin (CRESTOR) 10 MG tablet Take 10 mg by mouth daily.   Yes [provider]  senna-docusate (SENOKOT-S) 8.6-50 MG tablet Take by mouth. 08/27/23  Yes [provider]  sertraline  (ZOLOFT ) 100 MG tablet Take 150 mg by  mouth daily.   Yes [provider]  tirzepatide CLOYDE) 12.5 MG/0.5ML Pen Inject 12.5 mg into the skin once a week.   Yes [provider]  cephALEXin  (KEFLEX ) 500 MG capsule Take 1 capsule (500 mg total) by mouth 4 (four) times daily. 07/05/24   Neysa Caron PARAS, DO  magnesium  hydroxide (MILK OF MAGNESIA) 400 MG/5ML suspension Take 30 mLs by mouth daily as needed for mild constipation.    [provider]  midodrine  (PROAMATINE ) 5 MG tablet TAKE 2 TABS BY MOUTH 3 TIMES DAILY WITH MEALS 08/30/23   [provider]  sulfamethoxazole-trimethoprim (BACTRIM) 400-80 MG tablet Take 1 tablet by mouth daily.    [provider]  torsemide  (DEMADEX ) 20 MG tablet Take 1 tablet (20 mg total) by mouth daily. TAKE 2 TABLETS (40MG ) ONE DAY ALTERNATING WITH  (20MG ) 1 TABLET DAILY 05/29/23   Glena Harlene HERO, FNP    Family History Family History  Problem Relation Age of Onset   Heart attack Father        a. Initial onset  in his 35's. S/p CABG   Heart failure Father     Social History Social History[1]   Allergies   Patient has no known allergies.   Review of Systems Review of Systems  Constitutional:  Negative for chills and fever.  HENT:  Negative for ear pain and sore throat.   Eyes:  Negative for pain and visual disturbance.  Respiratory:  Negative for cough and shortness of breath.   Cardiovascular:  Negative for chest pain and palpitations.  Gastrointestinal:  Negative for abdominal pain and vomiting.  Genitourinary:  Negative for dysuria and hematuria.  Musculoskeletal:  Negative for arthralgias and back pain.  Skin:  Negative for color change and rash.  Neurological:  Positive for dizziness. Negative for seizures and syncope.  All other systems reviewed and are negative.    Physical Exam Triage Vital Signs ED Triage Vitals  Encounter Vitals Group     BP 11/22/24 1441 126/82     Girls Systolic BP Percentile --      Girls Diastolic BP  Percentile --      Boys Systolic BP Percentile --      Boys Diastolic BP Percentile --      Pulse Rate 11/22/24 1441 (!) 104     Resp 11/22/24 1441 18     Temp 11/22/24 1441 98.9 F (37.2 C)     Temp Source 11/22/24 1441 Oral     SpO2 11/22/24 1441 100 %     Weight 11/22/24 1439 195 lb (88.5 kg)     Height 11/22/24 1439 5' 7 (1.702 m)     Head Circumference --      Peak Flow --      Pain Score 11/22/24 1439 0     Pain Loc --      Pain Education --      Exclude from Growth Chart --    No data found.  Updated Vital Signs BP 126/82 (BP Location: Left Arm)   Pulse (!) 104   Temp 98.9 F (37.2 C) (Oral)   Resp 18   Ht 5' 7 (1.702 m)   Wt 195 lb (88.5 kg)   LMP 09/14/1991   SpO2 100%   BMI 30.54 kg/m   Visual Acuity Right Eye Distance:   Left Eye Distance:   Bilateral Distance:    Right Eye Near:   Left Eye Near:    Bilateral Near:     Physical Exam Vitals and nursing note reviewed.  Constitutional:      General: She is not in acute distress.    Appearance: She is well-developed.  HENT:     Head: Normocephalic and atraumatic.     Nose: No congestion.     Mouth/Throat:     Mouth: Mucous membranes are moist.  Eyes:     Conjunctiva/sclera: Conjunctivae normal.  Cardiovascular:     Rate and Rhythm: Normal rate and regular rhythm.     Pulses: Normal pulses.     Heart sounds: No murmur heard. Pulmonary:     Effort: Pulmonary effort is normal. No respiratory distress.     Breath sounds: Normal breath sounds. No wheezing or rhonchi.  Abdominal:     Palpations: Abdomen is soft.     Tenderness: There is no abdominal tenderness.  Musculoskeletal:        General: No swelling.     Cervical back: Neck supple.  Skin:    General: Skin is warm and dry.     Capillary Refill: Capillary refill  takes less than 2 seconds.  Neurological:     General: No focal deficit present.     Mental Status: She is alert.  Psychiatric:        Mood and Affect: Mood normal.       UC Treatments / Results  Labs (all labs ordered are listed, but only abnormal results are displayed) Labs Reviewed  POCT URINALYSIS DIP (MANUAL ENTRY) - Abnormal; Notable for the following components:      Result Value   Color, UA other (*)    Clarity, UA cloudy (*)    Glucose, UA =500 (*)    Bilirubin, UA small (*)    Spec Grav, UA >=1.030 (*)    Protein Ur, POC =100 (*)    All other components within normal limits  CBC WITH DIFFERENTIAL/PLATELET  COMPREHENSIVE METABOLIC PANEL WITH GFR  POCT FASTING CBG [facility] MANUAL ENTRY    EKG   Radiology No results found.  Procedures Procedures (including critical care time)  Medications Ordered in UC Medications - No data to display  Initial Impression / Assessment and Plan / UC Course  I have reviewed the triage vital signs and the nursing notes.  Pertinent labs & imaging results that were available during my care of the patient were reviewed by me and considered in my medical decision making (see chart for details).     Dizziness - Plan: POCT fasting CBG (manual entry), POCT fasting CBG (manual entry), POCT urinalysis dipstick, CBC with Differential/Platelet, Comprehensive metabolic panel, POCT urinalysis dipstick, CBC with Differential/Platelet, Comprehensive metabolic panel   Blood glucose done today is 94 which is within normal limits EKG done today and does not show any significant changes from previous EKG Urinalysis done today does not show an infectious process however there may be some aspect of some dehydration present Blood work done today including complete blood count and comprehensive metabolic panel.  This will check for elevated Marly Schuld blood cell, anemia, kidney problems, liver problems, electrolyte disorder.  These results will take approximate 24 to 48 hours to get final results.  If there are any abnormalities that require further treatment we will contact you. Vital signs and physical exam findings are  otherwise reassuring.  For now we will recommend monitoring your symptoms at home and if you feel that they are worsening then recommend going to the emergency room immediately for further evaluation.  Final Clinical Impressions(s) / UC Diagnoses   Final diagnoses:  Dizziness     Discharge Instructions      Blood glucose done today is 41 which is within normal limits EKG done today and does not show any significant changes from previous EKG Urinalysis done today does not show an infectious process however there may be some aspect of some dehydration present Blood work done today including complete blood count and comprehensive metabolic panel.  This will check for elevated Matilyn Fehrman blood cell, anemia, kidney problems, liver problems, electrolyte disorder.  These results will take approximate 24 to 48 hours to get final results.  If there are any abnormalities that require further treatment we will contact you. Vital signs and physical exam findings are otherwise reassuring.  For now we will recommend monitoring your symptoms at home and if you feel that they are worsening then recommend going to the emergency room immediately for further evaluation.     ED Prescriptions   None    PDMP not reviewed this encounter.    [1]  Social History Tobacco Use   Smoking  status: Former    Current packs/day: 0.00    Average packs/day: 1 pack/day for 20.0 years (20.0 ttl pk-yrs)    Types: Cigarettes    Start date: 59    Quit date: 2016    Years since quitting: 10.0   Smokeless tobacco: Never  Vaping Use   Vaping status: Never Used  Substance Use Topics   Alcohol use: No    Alcohol/week: 0.0 standard drinks of alcohol   Drug use: No     Teresa Almarie LABOR, PA-C 11/22/24 1529  "

## 2024-11-22 NOTE — Discharge Instructions (Addendum)
 Blood glucose done today is 94 which is within normal limits EKG done today and does not show any significant changes from previous EKG Urinalysis done today does not show an infectious process however there may be some aspect of some dehydration present Blood work done today including complete blood count and comprehensive metabolic panel.  This will check for elevated Toshi Ishii blood cell, anemia, kidney problems, liver problems, electrolyte disorder.  These results will take approximate 24 to 48 hours to get final results.  If there are any abnormalities that require further treatment we will contact you. Vital signs and physical exam findings are otherwise reassuring.  For now we will recommend monitoring your symptoms at home and if you feel that they are worsening then recommend going to the emergency room immediately for further evaluation.

## 2024-11-22 NOTE — ED Triage Notes (Signed)
 Patient states that she's been having dizziness and not feeling well that last couple of days.  History of vertigo.  Denies any nausea.

## 2024-11-25 ENCOUNTER — Ambulatory Visit (HOSPITAL_COMMUNITY): Payer: Self-pay

## 2024-11-25 LAB — COMPREHENSIVE METABOLIC PANEL WITH GFR
ALT: 39 [IU]/L — ABNORMAL HIGH (ref 0–32)
AST: 38 [IU]/L (ref 0–40)
Albumin: 4.2 g/dL (ref 3.8–4.9)
Alkaline Phosphatase: 168 [IU]/L — ABNORMAL HIGH (ref 49–135)
BUN/Creatinine Ratio: 22 (ref 9–23)
BUN: 28 mg/dL — ABNORMAL HIGH (ref 6–24)
Bilirubin Total: 0.3 mg/dL (ref 0.0–1.2)
CO2: 22 mmol/L (ref 20–29)
Calcium: 9.7 mg/dL (ref 8.7–10.2)
Chloride: 106 mmol/L (ref 96–106)
Creatinine, Ser: 1.27 mg/dL — ABNORMAL HIGH (ref 0.57–1.00)
Globulin, Total: 2.2 g/dL (ref 1.5–4.5)
Glucose: 89 mg/dL (ref 70–99)
Potassium: 4.2 mmol/L (ref 3.5–5.2)
Sodium: 143 mmol/L (ref 134–144)
Total Protein: 6.4 g/dL (ref 6.0–8.5)
eGFR: 49 mL/min/{1.73_m2} — ABNORMAL LOW

## 2024-11-25 LAB — CBC WITH DIFFERENTIAL/PLATELET
Basophils Absolute: 0 10*3/uL (ref 0.0–0.2)
Basos: 0 %
EOS (ABSOLUTE): 0.1 10*3/uL (ref 0.0–0.4)
Eos: 1 %
Hematocrit: 39.8 % (ref 34.0–46.6)
Hemoglobin: 12 g/dL (ref 11.1–15.9)
Immature Grans (Abs): 0 10*3/uL (ref 0.0–0.1)
Immature Granulocytes: 0 %
Lymphocytes Absolute: 1.3 10*3/uL (ref 0.7–3.1)
Lymphs: 15 %
MCH: 26.3 pg — ABNORMAL LOW (ref 26.6–33.0)
MCHC: 30.2 g/dL — ABNORMAL LOW (ref 31.5–35.7)
MCV: 87 fL (ref 79–97)
Monocytes Absolute: 0.9 10*3/uL (ref 0.1–0.9)
Monocytes: 11 %
Neutrophils Absolute: 6.1 10*3/uL (ref 1.4–7.0)
Neutrophils: 73 %
Platelets: 251 10*3/uL (ref 150–450)
RBC: 4.57 x10E6/uL (ref 3.77–5.28)
RDW: 12.9 % (ref 11.7–15.4)
WBC: 8.4 10*3/uL (ref 3.4–10.8)

## 2024-11-29 NOTE — Telephone Encounter (Signed)
 Pt returned call. Advised of results and recs. Pt reports having f/u'd with provider.
